# Patient Record
Sex: Female | Born: 1945
Health system: Southern US, Community
[De-identification: ages and names within clinical notes are randomized; demographics above are authoritative.]

## PROBLEM LIST (undated history)

## (undated) DIAGNOSIS — C50412 Malignant neoplasm of upper-outer quadrant of left female breast: Secondary | ICD-10-CM

## (undated) DIAGNOSIS — N3289 Other specified disorders of bladder: Secondary | ICD-10-CM

## (undated) DIAGNOSIS — N390 Urinary tract infection, site not specified: Secondary | ICD-10-CM

## (undated) DIAGNOSIS — H539 Unspecified visual disturbance: Secondary | ICD-10-CM

## (undated) DIAGNOSIS — R42 Dizziness and giddiness: Secondary | ICD-10-CM

## (undated) DIAGNOSIS — F32A Depression, unspecified: Secondary | ICD-10-CM

## (undated) DIAGNOSIS — J189 Pneumonia, unspecified organism: Secondary | ICD-10-CM

## (undated) DIAGNOSIS — R51 Headache: Secondary | ICD-10-CM

## (undated) DIAGNOSIS — D496 Neoplasm of unspecified behavior of brain: Secondary | ICD-10-CM

## (undated) DIAGNOSIS — R413 Other amnesia: Secondary | ICD-10-CM

## (undated) DIAGNOSIS — J329 Chronic sinusitis, unspecified: Secondary | ICD-10-CM

## (undated) DIAGNOSIS — I10 Essential (primary) hypertension: Secondary | ICD-10-CM

## (undated) DIAGNOSIS — F329 Major depressive disorder, single episode, unspecified: Secondary | ICD-10-CM

## (undated) DIAGNOSIS — M069 Rheumatoid arthritis, unspecified: Secondary | ICD-10-CM

## (undated) DIAGNOSIS — M1990 Unspecified osteoarthritis, unspecified site: Secondary | ICD-10-CM

## (undated) HISTORY — DX: Other amnesia: R41.3

## (undated) HISTORY — PX: CYSTECTOMY: SHX5119

## (undated) HISTORY — PX: ABDOMINAL HYSTERECTOMY: SHX81

## (undated) HISTORY — PX: STAPEDES SURGERY: SHX789

## (undated) HISTORY — PX: APPENDECTOMY: SHX54

## (undated) HISTORY — DX: Rheumatoid arthritis, unspecified: M06.9

## (undated) HISTORY — PX: TOTAL KNEE ARTHROPLASTY: SHX125

---

## 1975-06-08 HISTORY — PX: EYE SURGERY: SHX253

## 1976-06-07 HISTORY — PX: GANGLION CYST EXCISION: SHX1691

## 1999-02-26 ENCOUNTER — Emergency Department (HOSPITAL_COMMUNITY): Admission: EM | Admit: 1999-02-26 | Discharge: 1999-02-26 | Payer: Self-pay | Admitting: Emergency Medicine

## 1999-02-26 ENCOUNTER — Encounter: Payer: Self-pay | Admitting: Emergency Medicine

## 2005-06-11 ENCOUNTER — Encounter: Admission: RE | Admit: 2005-06-11 | Discharge: 2005-06-11 | Payer: Self-pay | Admitting: Orthopedic Surgery

## 2005-07-27 ENCOUNTER — Inpatient Hospital Stay (HOSPITAL_COMMUNITY): Admission: RE | Admit: 2005-07-27 | Discharge: 2005-07-30 | Payer: Self-pay | Admitting: Orthopedic Surgery

## 2011-06-02 ENCOUNTER — Other Ambulatory Visit (HOSPITAL_COMMUNITY): Payer: Self-pay | Admitting: Otolaryngology

## 2011-06-10 ENCOUNTER — Other Ambulatory Visit: Payer: Self-pay | Admitting: Neurosurgery

## 2011-06-11 ENCOUNTER — Encounter (HOSPITAL_COMMUNITY): Payer: Self-pay

## 2011-06-14 ENCOUNTER — Other Ambulatory Visit: Payer: Self-pay | Admitting: Neurosurgery

## 2011-06-14 DIAGNOSIS — D320 Benign neoplasm of cerebral meninges: Secondary | ICD-10-CM

## 2011-06-17 ENCOUNTER — Ambulatory Visit
Admission: RE | Admit: 2011-06-17 | Discharge: 2011-06-17 | Disposition: A | Discharge status: Home / Self Care | Payer: Medicare Other | Source: Ambulatory Visit | Attending: Neurosurgery | Admitting: Neurosurgery

## 2011-06-17 ENCOUNTER — Other Ambulatory Visit: Payer: Medicare Other

## 2011-06-17 DIAGNOSIS — D320 Benign neoplasm of cerebral meninges: Secondary | ICD-10-CM

## 2011-06-17 MED ORDER — GADOBENATE DIMEGLUMINE 529 MG/ML IV SOLN
16.0000 mL | Freq: Once | INTRAVENOUS | Status: AC | PRN
  Administered 2011-06-17: 16 mL via INTRAVENOUS

## 2011-06-18 ENCOUNTER — Encounter (HOSPITAL_COMMUNITY): Payer: Self-pay

## 2011-06-18 ENCOUNTER — Other Ambulatory Visit: Payer: Self-pay

## 2011-06-18 ENCOUNTER — Encounter (HOSPITAL_COMMUNITY)
Admission: RE | Admit: 2011-06-18 | Discharge: 2011-06-18 | Disposition: A | Discharge status: Home / Self Care | Payer: Medicare Other | Source: Ambulatory Visit | Attending: Neurosurgery | Admitting: Neurosurgery

## 2011-06-18 HISTORY — DX: Neoplasm of unspecified behavior of brain: D49.6

## 2011-06-18 HISTORY — DX: Unspecified osteoarthritis, unspecified site: M19.90

## 2011-06-18 HISTORY — DX: Other specified disorders of bladder: N32.89

## 2011-06-18 HISTORY — DX: Unspecified visual disturbance: H53.9

## 2011-06-18 HISTORY — DX: Dizziness and giddiness: R42

## 2011-06-18 HISTORY — DX: Essential (primary) hypertension: I10

## 2011-06-18 HISTORY — DX: Headache: R51

## 2011-06-18 HISTORY — DX: Major depressive disorder, single episode, unspecified: F32.9

## 2011-06-18 HISTORY — DX: Chronic sinusitis, unspecified: J32.9

## 2011-06-18 HISTORY — DX: Depression, unspecified: F32.A

## 2011-06-18 LAB — TYPE AND SCREEN
ABO/RH(D): O POS
Unit division: 0
Unit division: 0

## 2011-06-18 LAB — CBC
Hemoglobin: 12.5 g/dL (ref 12.0–15.0)
MCH: 30.6 pg (ref 26.0–34.0)
MCHC: 34.8 g/dL (ref 30.0–36.0)

## 2011-06-18 LAB — BASIC METABOLIC PANEL
BUN: 28 mg/dL — ABNORMAL HIGH (ref 6–23)
Creatinine, Ser: 0.9 mg/dL (ref 0.50–1.10)
GFR calc non Af Amer: 65 mL/min — ABNORMAL LOW (ref >90)
Glucose, Bld: 125 mg/dL — ABNORMAL HIGH (ref 70–99)
Potassium: 4.4 mEq/L (ref 3.5–5.1)

## 2011-06-18 LAB — SURGICAL PCR SCREEN: MRSA, PCR: NEGATIVE

## 2011-06-18 LAB — ABO/RH: ABO/RH(D): O POS

## 2011-06-18 NOTE — Pre-Procedure Instructions (Signed)
19 Jacqueline Christian  06/18/2011   Your procedure is scheduled on:  Tuesday, January 15  Report to [facility] Stay Center at 6:30 AM.  Call this number if you have problems the morning of surgery: [phone]   Remember:   Do not eat food:After Midnight.  May have clear liquids: up to 4 Hours before arrival.  Clear liquids include soda, tea, black coffee, apple or grape juice, broth.  Take these medicines the morning of surgery with A SIP OF WATER: Atenolol, Zyrtec if needed, Ditropan   Do not wear jewelry, make-up or nail polish.  Do not wear lotions, powders, or perfumes. You may wear deodorant.  Do not shave 48 hours prior to surgery.  Do not bring valuables to the hospital.  Contacts, dentures or bridgework may not be worn into surgery.  Leave suitcase in the car. After surgery it may be brought to your room.  For patients admitted to the hospital, checkout time is 11:00 AM the day of discharge.   Patients discharged the day of surgery will not be allowed to drive home.  Name and phone number of your driver: NA  Special Instructions: CHG Shower Use Special Wash: 1/2 bottle night before surgery and 1/2 bottle morning of surgery.   Please read over the following fact sheets that you were given: Pain Booklet, Coughing and Deep Breathing, Blood Transfusion Information and Surgical Site Infection Prevention

## 2011-06-18 NOTE — Progress Notes (Signed)
Chart left for anesthesia review re: elevated WBC. Pt was recently treated for sinus infection.

## 2011-06-21 MED ORDER — CEFAZOLIN SODIUM-DEXTROSE 2-3 GM-% IV SOLR
2.0000 g | INTRAVENOUS | Status: AC
  Administered 2011-06-22: 2 g via INTRAVENOUS
  Filled 2011-06-21: qty 50

## 2011-06-21 NOTE — Consult Note (Addendum)
Anesthesia:  Patient is a 66 year old female scheduled for a craniotomy for 06/22/11 for a meningioma.  Her history includes HTN, depression, headaches, recurrent sinus infections, and brain tumor.    EKG from 06/18/11 shows NSR.  CXR from Dr. Jackolyn Confer office done on 05/07/11 showed no evidence for acute cardiopulmonary abnormality.    I was asked to review her labs showing an elevated WBC of 18.1K.  Patient is not here for me to exam, but she is on Decadron, so would favor this as a contributing factor.  Clinical correlation on the day of surgery.  She was afebrile, sats 96 % at her PAT appointment on 06/18/11.  No acute respiratory symptoms were documented at that time.  Could consider a repeat CXR if any respiratory symptoms on the day of surgery; otherwise will only plan to order a UA at this time.

## 2011-06-22 ENCOUNTER — Inpatient Hospital Stay (HOSPITAL_COMMUNITY)
Admission: RE | Admit: 2011-06-22 | Discharge: 2011-07-07 | DRG: 003 | Disposition: A | Discharge status: Rehabilitation Facility | Payer: Medicare Other | Source: Ambulatory Visit | Attending: Neurosurgery | Admitting: Neurosurgery

## 2011-06-22 ENCOUNTER — Other Ambulatory Visit: Payer: Self-pay | Admitting: Neurosurgery

## 2011-06-22 ENCOUNTER — Encounter (HOSPITAL_COMMUNITY): Payer: Self-pay | Admitting: Vascular Surgery

## 2011-06-22 ENCOUNTER — Ambulatory Visit (HOSPITAL_COMMUNITY): Payer: Medicare Other | Admitting: Vascular Surgery

## 2011-06-22 ENCOUNTER — Encounter (HOSPITAL_COMMUNITY)
Admission: RE | Disposition: A | Discharge status: Rehabilitation Facility | Payer: Self-pay | Source: Ambulatory Visit | Attending: Neurosurgery

## 2011-06-22 ENCOUNTER — Inpatient Hospital Stay (HOSPITAL_COMMUNITY): Payer: Medicare Other | Admitting: Anesthesiology

## 2011-06-22 ENCOUNTER — Encounter (HOSPITAL_COMMUNITY): Payer: Self-pay | Admitting: Anesthesiology

## 2011-06-22 ENCOUNTER — Inpatient Hospital Stay (HOSPITAL_COMMUNITY): Payer: Medicare Other

## 2011-06-22 DIAGNOSIS — D696 Thrombocytopenia, unspecified: Secondary | ICD-10-CM | POA: Diagnosis not present

## 2011-06-22 DIAGNOSIS — G8190 Hemiplegia, unspecified affecting unspecified side: Secondary | ICD-10-CM | POA: Diagnosis not present

## 2011-06-22 DIAGNOSIS — G936 Cerebral edema: Secondary | ICD-10-CM | POA: Diagnosis not present

## 2011-06-22 DIAGNOSIS — E876 Hypokalemia: Secondary | ICD-10-CM | POA: Diagnosis not present

## 2011-06-22 DIAGNOSIS — I10 Essential (primary) hypertension: Secondary | ICD-10-CM | POA: Diagnosis present

## 2011-06-22 DIAGNOSIS — J189 Pneumonia, unspecified organism: Secondary | ICD-10-CM | POA: Diagnosis not present

## 2011-06-22 DIAGNOSIS — R7309 Other abnormal glucose: Secondary | ICD-10-CM | POA: Diagnosis not present

## 2011-06-22 DIAGNOSIS — I6200 Nontraumatic subdural hemorrhage, unspecified: Secondary | ICD-10-CM | POA: Diagnosis not present

## 2011-06-22 DIAGNOSIS — J9600 Acute respiratory failure, unspecified whether with hypoxia or hypercapnia: Secondary | ICD-10-CM | POA: Diagnosis not present

## 2011-06-22 DIAGNOSIS — D320 Benign neoplasm of cerebral meninges: Principal | ICD-10-CM | POA: Diagnosis present

## 2011-06-22 DIAGNOSIS — Z96659 Presence of unspecified artificial knee joint: Secondary | ICD-10-CM

## 2011-06-22 DIAGNOSIS — Z01812 Encounter for preprocedural laboratory examination: Secondary | ICD-10-CM

## 2011-06-22 DIAGNOSIS — D649 Anemia, unspecified: Secondary | ICD-10-CM | POA: Diagnosis not present

## 2011-06-22 DIAGNOSIS — I498 Other specified cardiac arrhythmias: Secondary | ICD-10-CM | POA: Diagnosis not present

## 2011-06-22 DIAGNOSIS — E46 Unspecified protein-calorie malnutrition: Secondary | ICD-10-CM | POA: Diagnosis not present

## 2011-06-22 DIAGNOSIS — Z79899 Other long term (current) drug therapy: Secondary | ICD-10-CM

## 2011-06-22 HISTORY — PX: CRANIOTOMY: SHX93

## 2011-06-22 LAB — PREPARE FRESH FROZEN PLASMA
Unit division: 0
Unit division: 0

## 2011-06-22 LAB — URINALYSIS, MICROSCOPIC ONLY
Ketones, ur: NEGATIVE mg/dL
Leukocytes, UA: NEGATIVE
Nitrite: NEGATIVE
Protein, ur: NEGATIVE mg/dL

## 2011-06-22 LAB — APTT: aPTT: 22 seconds — ABNORMAL LOW (ref 24–37)

## 2011-06-22 LAB — PROTIME-INR
INR: 1.24 (ref 0.00–1.49)
Prothrombin Time: 15.9 seconds — ABNORMAL HIGH (ref 11.6–15.2)

## 2011-06-22 LAB — CBC
MCV: 86.4 fL (ref 78.0–100.0)
Platelets: 109 10*3/uL — ABNORMAL LOW (ref 150–400)
RBC: 3.52 MIL/uL — ABNORMAL LOW (ref 3.87–5.11)
RDW: 14.7 % (ref 11.5–15.5)
WBC: 11.5 10*3/uL — ABNORMAL HIGH (ref 4.0–10.5)

## 2011-06-22 SURGERY — CRANIOTOMY TUMOR EXCISION
Anesthesia: General | Site: Head | Laterality: Right | Wound class: Clean

## 2011-06-22 SURGERY — CRANIOTOMY HEMATOMA EVACUATION SUBDURAL
Anesthesia: General | Site: Head | Laterality: Right | Wound class: Clean

## 2011-06-22 MED ORDER — PROPOFOL 10 MG/ML IV EMUL
INTRAVENOUS | Status: DC | PRN
  Administered 2011-06-22: 50 ug/kg/min via INTRAVENOUS

## 2011-06-22 MED ORDER — FENTANYL CITRATE 0.05 MG/ML IJ SOLN
INTRAMUSCULAR | Status: DC | PRN
  Administered 2011-06-22 (×2): 50 ug via INTRAVENOUS

## 2011-06-22 MED ORDER — OXYBUTYNIN CHLORIDE 5 MG PO TABS
5.0000 mg | ORAL_TABLET | Freq: Two times a day (BID) | ORAL | Status: DC
  Administered 2011-06-22 – 2011-06-26 (×8): 5 mg via ORAL
  Filled 2011-06-22 (×9): qty 1

## 2011-06-22 MED ORDER — ROCURONIUM BROMIDE 100 MG/10ML IV SOLN
INTRAVENOUS | Status: DC | PRN
  Administered 2011-06-22 (×2): 20 mg via INTRAVENOUS
  Administered 2011-06-22: 60 mg via INTRAVENOUS

## 2011-06-22 MED ORDER — HYDROMORPHONE HCL PF 1 MG/ML IJ SOLN
INTRAMUSCULAR | Status: AC
  Filled 2011-06-22: qty 1

## 2011-06-22 MED ORDER — ONDANSETRON HCL 4 MG/2ML IJ SOLN: 4.0000 mg | Freq: Once | INTRAMUSCULAR | Status: DC | PRN

## 2011-06-22 MED ORDER — BACITRACIN 50000 UNITS IM SOLR
INTRAMUSCULAR | Status: AC
  Filled 2011-06-22: qty 1

## 2011-06-22 MED ORDER — DEXAMETHASONE 4 MG PO TABS: 4.0000 mg | ORAL_TABLET | Freq: Three times a day (TID) | ORAL | Status: DC

## 2011-06-22 MED ORDER — SODIUM CHLORIDE 0.9 % IV SOLN
INTRAVENOUS | Status: DC | PRN
  Administered 2011-06-22 (×2): via INTRAVENOUS

## 2011-06-22 MED ORDER — ACETAMINOPHEN 325 MG PO TABS
650.0000 mg | ORAL_TABLET | ORAL | Status: DC | PRN
  Administered 2011-06-24 – 2011-06-26 (×2): 650 mg via ORAL
  Filled 2011-06-22 (×2): qty 2

## 2011-06-22 MED ORDER — PROPOFOL 10 MG/ML IV EMUL
5.0000 ug/kg/min | INTRAVENOUS | Status: DC
  Administered 2011-06-23: 70 ug/kg/min via INTRAVENOUS
  Administered 2011-06-23 (×2): 20 ug/kg/min via INTRAVENOUS
  Administered 2011-06-24 (×2): 40 ug/kg/min via INTRAVENOUS
  Administered 2011-06-24: 20 ug/kg/min via INTRAVENOUS
  Administered 2011-06-24 (×2): 40 ug/kg/min via INTRAVENOUS
  Administered 2011-06-25: 35 ug/kg/min via INTRAVENOUS
  Administered 2011-06-25: 40 ug/kg/min via INTRAVENOUS
  Administered 2011-06-25: 35 ug/kg/min via INTRAVENOUS
  Administered 2011-06-25: 40 ug/kg/min via INTRAVENOUS
  Administered 2011-06-25 – 2011-06-26 (×2): 35 ug/kg/min via INTRAVENOUS
  Filled 2011-06-22 (×15): qty 100

## 2011-06-22 MED ORDER — HYDRALAZINE HCL 20 MG/ML IJ SOLN
INTRAMUSCULAR | Status: DC | PRN
  Administered 2011-06-22 (×2): 4 mg via INTRAVENOUS

## 2011-06-22 MED ORDER — ACETAMINOPHEN 650 MG RE SUPP: 650.0000 mg | RECTAL | Status: DC | PRN

## 2011-06-22 MED ORDER — ACETAMINOPHEN-CODEINE #3 300-30 MG PO TABS
1.0000 | ORAL_TABLET | ORAL | Status: DC | PRN
  Administered 2011-07-02 – 2011-07-03 (×2): 2 via ORAL
  Administered 2011-07-03 – 2011-07-04 (×4): 1 via ORAL
  Administered 2011-07-05 – 2011-07-06 (×4): 2 via ORAL
  Administered 2011-07-07: 1 via ORAL
  Filled 2011-06-22 (×2): qty 2
  Filled 2011-06-22: qty 1
  Filled 2011-06-22: qty 2
  Filled 2011-06-22: qty 1
  Filled 2011-06-22: qty 2
  Filled 2011-06-22: qty 1
  Filled 2011-06-22: qty 2
  Filled 2011-06-22: qty 1
  Filled 2011-06-22: qty 2
  Filled 2011-06-22 (×2): qty 1

## 2011-06-22 MED ORDER — SODIUM CHLORIDE 0.9 % IV SOLN
500.0000 mg | INTRAVENOUS | Status: DC
  Filled 2011-06-22: qty 5

## 2011-06-22 MED ORDER — FAMOTIDINE 20 MG PO TABS: 20.0000 mg | ORAL_TABLET | Freq: Two times a day (BID) | ORAL | Status: DC

## 2011-06-22 MED ORDER — PANTOPRAZOLE SODIUM 40 MG IV SOLR
40.0000 mg | Freq: Every day | INTRAVENOUS | Status: DC
  Administered 2011-06-22 – 2011-06-25 (×4): 40 mg via INTRAVENOUS
  Filled 2011-06-22 (×5): qty 40

## 2011-06-22 MED ORDER — LABETALOL HCL 5 MG/ML IV SOLN
INTRAVENOUS | Status: AC
  Administered 2011-06-22: 20 mg via INTRAVENOUS
  Filled 2011-06-22: qty 4

## 2011-06-22 MED ORDER — POTASSIUM CHLORIDE IN NACL 20-0.9 MEQ/L-% IV SOLN
INTRAVENOUS | Status: DC
  Administered 2011-06-22 – 2011-06-23 (×2): via INTRAVENOUS
  Filled 2011-06-22 (×4): qty 1000

## 2011-06-22 MED ORDER — HYDROMORPHONE HCL PF 1 MG/ML IJ SOLN: 0.5000 mg | INTRAMUSCULAR | Status: DC | PRN

## 2011-06-22 MED ORDER — GLYCOPYRROLATE 0.2 MG/ML IJ SOLN
INTRAMUSCULAR | Status: DC | PRN
  Administered 2011-06-22: .7 mg via INTRAVENOUS
  Administered 2011-06-22: 0.2 mg via INTRAVENOUS

## 2011-06-22 MED ORDER — DEXAMETHASONE SODIUM PHOSPHATE 4 MG/ML IJ SOLN
4.0000 mg | Freq: Three times a day (TID) | INTRAMUSCULAR | Status: DC
  Filled 2011-06-22 (×2): qty 1

## 2011-06-22 MED ORDER — PROPOFOL 10 MG/ML IV EMUL
INTRAVENOUS | Status: DC | PRN
  Administered 2011-06-22: 150 mg via INTRAVENOUS
  Administered 2011-06-22: 50 mg via INTRAVENOUS

## 2011-06-22 MED ORDER — 0.9 % SODIUM CHLORIDE (POUR BTL) OPTIME
TOPICAL | Status: DC | PRN
  Administered 2011-06-22 (×2): 1000 mL

## 2011-06-22 MED ORDER — INSULIN ASPART 100 UNIT/ML ~~LOC~~ SOLN
0.0000 [IU] | SUBCUTANEOUS | Status: DC
  Filled 2011-06-22 (×3): qty 3

## 2011-06-22 MED ORDER — LIDOCAINE-EPINEPHRINE 1 %-1:100000 IJ SOLN
INTRAMUSCULAR | Status: DC | PRN
  Administered 2011-06-22: 20 mL via INTRADERMAL

## 2011-06-22 MED ORDER — HETASTARCH-ELECTROLYTES 6 % IV SOLN
INTRAVENOUS | Status: DC | PRN
  Administered 2011-06-22: 12:00:00 via INTRAVENOUS

## 2011-06-22 MED ORDER — HYDROMORPHONE HCL PF 1 MG/ML IJ SOLN: 0.2500 mg | INTRAMUSCULAR | Status: DC | PRN

## 2011-06-22 MED ORDER — LABETALOL HCL 5 MG/ML IV SOLN: 10.0000 mg | INTRAVENOUS | Status: DC | PRN

## 2011-06-22 MED ORDER — CEFAZOLIN SODIUM 1-5 GM-% IV SOLN
INTRAVENOUS | Status: AC
  Filled 2011-06-22: qty 50

## 2011-06-22 MED ORDER — SENNOSIDES-DOCUSATE SODIUM 8.6-50 MG PO TABS
1.0000 | ORAL_TABLET | Freq: Two times a day (BID) | ORAL | Status: DC
  Administered 2011-06-22 – 2011-06-27 (×11): 1 via ORAL
  Filled 2011-06-22 (×14): qty 1

## 2011-06-22 MED ORDER — SODIUM CHLORIDE 0.9 % IV SOLN
INTRAVENOUS | Status: AC
  Administered 2011-06-22 (×2): via INTRAVENOUS
  Filled 2011-06-22: qty 500

## 2011-06-22 MED ORDER — SODIUM CHLORIDE 0.9 % IV SOLN: INTRAVENOUS | Status: DC

## 2011-06-22 MED ORDER — FENTANYL CITRATE 0.05 MG/ML IJ SOLN
INTRAMUSCULAR | Status: DC | PRN
  Administered 2011-06-22: 150 ug via INTRAVENOUS
  Administered 2011-06-22 (×2): 100 ug via INTRAVENOUS
  Administered 2011-06-22: 50 ug via INTRAVENOUS
  Administered 2011-06-22: 100 ug via INTRAVENOUS

## 2011-06-22 MED ORDER — PROPOFOL 10 MG/ML IV EMUL
INTRAVENOUS | Status: DC | PRN
  Administered 2011-06-22: 80 mg via INTRAVENOUS

## 2011-06-22 MED ORDER — DEXAMETHASONE SODIUM PHOSPHATE 4 MG/ML IJ SOLN
6.0000 mg | Freq: Four times a day (QID) | INTRAMUSCULAR | Status: AC
  Administered 2011-06-22 – 2011-06-23 (×4): 6 mg via INTRAVENOUS
  Filled 2011-06-22 (×3): qty 2
  Filled 2011-06-22: qty 1.5

## 2011-06-22 MED ORDER — PHENYLEPHRINE HCL 10 MG/ML IJ SOLN
10.0000 mg | INTRAVENOUS | Status: DC | PRN
  Administered 2011-06-22: 10 ug/min via INTRAVENOUS

## 2011-06-22 MED ORDER — LORATADINE 10 MG PO TABS
10.0000 mg | ORAL_TABLET | Freq: Every day | ORAL | Status: DC
  Administered 2011-06-23 – 2011-06-26 (×4): 10 mg via ORAL
  Filled 2011-06-22 (×4): qty 1

## 2011-06-22 MED ORDER — EPHEDRINE SULFATE 50 MG/ML IJ SOLN
INTRAMUSCULAR | Status: DC | PRN
  Administered 2011-06-22: 5 mg via INTRAVENOUS

## 2011-06-22 MED ORDER — ACETAMINOPHEN 325 MG PO TABS: 650.0000 mg | ORAL_TABLET | ORAL | Status: DC | PRN

## 2011-06-22 MED ORDER — SODIUM CHLORIDE 0.9 % IR SOLN
Status: DC | PRN
  Administered 2011-06-22: 3000 mL
  Administered 2011-06-22: 1000 mL

## 2011-06-22 MED ORDER — DEXAMETHASONE 4 MG PO TABS: 4.0000 mg | ORAL_TABLET | Freq: Four times a day (QID) | ORAL | Status: DC

## 2011-06-22 MED ORDER — SODIUM CHLORIDE 0.9 % IV SOLN
INTRAVENOUS | Status: AC
  Filled 2011-06-22: qty 500

## 2011-06-22 MED ORDER — ROCURONIUM BROMIDE 100 MG/10ML IV SOLN
INTRAVENOUS | Status: DC | PRN
  Administered 2011-06-22: 40 mg via INTRAVENOUS

## 2011-06-22 MED ORDER — ALBUMIN HUMAN 5 % IV SOLN
INTRAVENOUS | Status: DC | PRN
  Administered 2011-06-22: 12:00:00 via INTRAVENOUS

## 2011-06-22 MED ORDER — SODIUM CHLORIDE 0.9 % IV SOLN
INTRAVENOUS | Status: DC | PRN
  Administered 2011-06-22: 22:00:00 via INTRAVENOUS

## 2011-06-22 MED ORDER — THROMBIN 20000 UNITS EX KIT
PACK | CUTANEOUS | Status: DC | PRN
  Administered 2011-06-22 (×2): 20000 [IU] via TOPICAL

## 2011-06-22 MED ORDER — ATENOLOL 25 MG PO TABS
25.0000 mg | ORAL_TABLET | Freq: Every day | ORAL | Status: DC
  Administered 2011-06-23 – 2011-06-27 (×3): 25 mg via ORAL
  Filled 2011-06-22 (×7): qty 1

## 2011-06-22 MED ORDER — CEFAZOLIN SODIUM 1-5 GM-% IV SOLN
INTRAVENOUS | Status: DC | PRN
  Administered 2011-06-22: 1 g via INTRAVENOUS

## 2011-06-22 MED ORDER — DEXAMETHASONE SODIUM PHOSPHATE 4 MG/ML IJ SOLN
INTRAMUSCULAR | Status: DC | PRN
  Administered 2011-06-22 (×2): 10 mg via INTRAVENOUS

## 2011-06-22 MED ORDER — LABETALOL HCL 5 MG/ML IV SOLN
INTRAVENOUS | Status: DC | PRN
  Administered 2011-06-22: 20 mg via INTRAVENOUS
  Administered 2011-06-22 (×3): 10 mg via INTRAVENOUS

## 2011-06-22 MED ORDER — BACITRACIN 50000 UNITS IM SOLR
INTRAMUSCULAR | Status: AC
  Administered 2011-06-22: 22:00:00
  Filled 2011-06-22: qty 1

## 2011-06-22 MED ORDER — SUFENTANIL CITRATE 50 MCG/ML IV SOLN
INTRAVENOUS | Status: DC | PRN
  Administered 2011-06-22: 15 ug via INTRAVENOUS
  Administered 2011-06-22: 20 ug via INTRAVENOUS
  Administered 2011-06-22: 15 ug via INTRAVENOUS

## 2011-06-22 MED ORDER — HYDROMORPHONE HCL PF 1 MG/ML IJ SOLN
0.5000 mg | INTRAMUSCULAR | Status: DC | PRN
  Administered 2011-06-22: 1 mg via INTRAVENOUS

## 2011-06-22 MED ORDER — BACITRACIN ZINC 500 UNIT/GM EX OINT
TOPICAL_OINTMENT | CUTANEOUS | Status: DC | PRN
  Administered 2011-06-22: 1 via TOPICAL

## 2011-06-22 MED ORDER — NEOSTIGMINE METHYLSULFATE 1 MG/ML IJ SOLN
INTRAMUSCULAR | Status: DC | PRN
  Administered 2011-06-22: 5 mg via INTRAVENOUS

## 2011-06-22 MED ORDER — HYDROCODONE-ACETAMINOPHEN 5-325 MG PO TABS: 1.0000 | ORAL_TABLET | ORAL | Status: DC | PRN

## 2011-06-22 MED ORDER — SODIUM CHLORIDE 0.9 % IV SOLN
500.0000 mg | Freq: Two times a day (BID) | INTRAVENOUS | Status: DC
  Administered 2011-06-22 – 2011-07-03 (×22): 500 mg via INTRAVENOUS
  Filled 2011-06-22 (×24): qty 5

## 2011-06-22 MED ORDER — MANNITOL 25 % IV SOLN
INTRAVENOUS | Status: DC | PRN
  Administered 2011-06-22: 37.5 g via INTRAVENOUS

## 2011-06-22 MED ORDER — ONDANSETRON HCL 4 MG PO TABS: 4.0000 mg | ORAL_TABLET | ORAL | Status: DC | PRN

## 2011-06-22 MED ORDER — ONDANSETRON HCL 4 MG/2ML IJ SOLN: 4.0000 mg | INTRAMUSCULAR | Status: DC | PRN

## 2011-06-22 MED ORDER — PANTOPRAZOLE SODIUM 40 MG IV SOLR: 40.0000 mg | Freq: Every day | INTRAVENOUS | Status: DC

## 2011-06-22 MED ORDER — HEMOSTATIC AGENTS (NO CHARGE) OPTIME
TOPICAL | Status: DC | PRN
  Administered 2011-06-22: 2 via TOPICAL

## 2011-06-22 MED ORDER — DEXAMETHASONE 6 MG PO TABS
6.0000 mg | ORAL_TABLET | Freq: Four times a day (QID) | ORAL | Status: DC
  Filled 2011-06-22 (×3): qty 1

## 2011-06-22 MED ORDER — CEFAZOLIN SODIUM 1-5 GM-% IV SOLN
1.0000 g | Freq: Three times a day (TID) | INTRAVENOUS | Status: AC
  Administered 2011-06-22 – 2011-06-23 (×2): 1 g via INTRAVENOUS
  Filled 2011-06-22 (×2): qty 50

## 2011-06-22 MED ORDER — SODIUM CHLORIDE 0.9 % IV SOLN
INTRAVENOUS | Status: DC | PRN
  Administered 2011-06-22 (×3): via INTRAVENOUS

## 2011-06-22 MED ORDER — BUPIVACAINE HCL (PF) 0.5 % IJ SOLN
INTRAMUSCULAR | Status: DC | PRN
  Administered 2011-06-22: 30 mL

## 2011-06-22 MED ORDER — LIDOCAINE HCL (CARDIAC) 20 MG/ML IV SOLN
INTRAVENOUS | Status: DC | PRN
  Administered 2011-06-22: 40 mg via INTRAVENOUS

## 2011-06-22 MED ORDER — HEMOSTATIC AGENTS (NO CHARGE) OPTIME
TOPICAL | Status: DC | PRN
  Administered 2011-06-22: 1 via TOPICAL

## 2011-06-22 MED ORDER — LABETALOL HCL 5 MG/ML IV SOLN
10.0000 mg | INTRAVENOUS | Status: DC | PRN
  Administered 2011-06-22 (×3): 20 mg via INTRAVENOUS

## 2011-06-22 MED ORDER — DEXAMETHASONE SODIUM PHOSPHATE 4 MG/ML IJ SOLN
4.0000 mg | Freq: Four times a day (QID) | INTRAMUSCULAR | Status: AC
  Administered 2011-06-23 – 2011-06-24 (×3): 4 mg via INTRAVENOUS
  Filled 2011-06-22 (×4): qty 1

## 2011-06-22 MED ORDER — DOCUSATE SODIUM 100 MG PO CAPS: 100.0000 mg | ORAL_CAPSULE | Freq: Two times a day (BID) | ORAL | Status: DC

## 2011-06-22 MED ORDER — ONDANSETRON HCL 4 MG/2ML IJ SOLN: 4.0000 mg | Freq: Four times a day (QID) | INTRAMUSCULAR | Status: DC | PRN

## 2011-06-22 MED ORDER — SODIUM CHLORIDE 0.9 % IV SOLN: 500.0000 mg | Freq: Two times a day (BID) | INTRAVENOUS | Status: DC

## 2011-06-22 MED ORDER — ONDANSETRON HCL 4 MG/2ML IJ SOLN
INTRAMUSCULAR | Status: DC | PRN
  Administered 2011-06-22: 4 mg via INTRAVENOUS

## 2011-06-22 MED ORDER — DEXAMETHASONE SODIUM PHOSPHATE 4 MG/ML IJ SOLN: 6.0000 mg | Freq: Four times a day (QID) | INTRAMUSCULAR | Status: DC

## 2011-06-22 MED ORDER — DEXTROSE 10 % IV SOLN: INTRAVENOUS | Status: DC

## 2011-06-22 MED ORDER — THROMBIN 20000 UNITS EX KIT
PACK | CUTANEOUS | Status: DC | PRN
  Administered 2011-06-22: 22:00:00 via TOPICAL

## 2011-06-22 SURGICAL SUPPLY — 100 items
BANDAGE GAUZE 4  KLING STR (GAUZE/BANDAGES/DRESSINGS) ×2 IMPLANT
BIT DRILL WIRE PASS 1.3MM (BIT) ×1 IMPLANT
BLADE SURG 11 STRL SS (BLADE) ×2 IMPLANT
BLADE SURG ROTATE 9660 (MISCELLANEOUS) ×2 IMPLANT
BRUSH SCRUB EZ 1% IODOPHOR (MISCELLANEOUS) ×2 IMPLANT
BRUSH SCRUB EZ PLAIN DRY (MISCELLANEOUS) ×2 IMPLANT
BUR ACORN 6.0 PRECISION (BURR) ×2 IMPLANT
BUR ADDG 1.1 (BURR) IMPLANT
BUR ROUND FLUTED 4 SOFT TCH (BURR) ×2 IMPLANT
BUR ROUTER D-58 CRANI (BURR) IMPLANT
CANISTER SUCTION 2500CC (MISCELLANEOUS) ×2 IMPLANT
CLIP TI LARGE 6 (CLIP) ×4 IMPLANT
CLIP TI MEDIUM 6 (CLIP) IMPLANT
CLOTH BEACON ORANGE TIMEOUT ST (SAFETY) ×2 IMPLANT
CONT SPEC 4OZ CLIKSEAL STRL BL (MISCELLANEOUS) ×4 IMPLANT
CORDS BIPOLAR (ELECTRODE) ×2 IMPLANT
COVER MAYO STAND STRL (DRAPES) IMPLANT
DRAIN SNY WOU 7FLT (WOUND CARE) IMPLANT
DRAPE CAMERA VIDEO/LASER (DRAPES) IMPLANT
DRAPE LONG LASER MIC (DRAPES) IMPLANT
DRAPE MICROSCOPE LEICA (MISCELLANEOUS) ×2 IMPLANT
DRAPE NEUROLOGICAL W/INCISE (DRAPES) ×2 IMPLANT
DRAPE STERI IOBAN 125X83 (DRAPES) IMPLANT
DRAPE WARM FLUID 44X44 (DRAPE) ×2 IMPLANT
DRESSING TELFA 8X3 (GAUZE/BANDAGES/DRESSINGS) ×2 IMPLANT
DRILL WIRE PASS 1.3MM (BIT) ×2
DRSG OPSITE 4X5.5 SM (GAUZE/BANDAGES/DRESSINGS) ×4 IMPLANT
DURAMATRIX ONLAY 3X3 (Plate) ×2 IMPLANT
DURAPREP 6ML APPLICATOR 50/CS (WOUND CARE) ×4 IMPLANT
ELECT CAUTERY BLADE 6.4 (BLADE) ×2 IMPLANT
ELECT REM PT RETURN 9FT ADLT (ELECTROSURGICAL) ×2
ELECTRODE REM PT RTRN 9FT ADLT (ELECTROSURGICAL) ×1 IMPLANT
EVACUATOR 1/8 PVC DRAIN (DRAIN) IMPLANT
EVACUATOR SILICONE 100CC (DRAIN) IMPLANT
FORCEPS BIPOLAR SPETZLER 8 1.0 (NEUROSURGERY SUPPLIES) ×2 IMPLANT
GAUZE KERLIX 2  STERILE LF (GAUZE/BANDAGES/DRESSINGS) ×2 IMPLANT
GAUZE SPONGE 4X4 16PLY XRAY LF (GAUZE/BANDAGES/DRESSINGS) IMPLANT
GLOVE BIO SURGEON STRL SZ8 (GLOVE) ×6 IMPLANT
GLOVE BIOGEL PI IND STRL 7.0 (GLOVE) ×2 IMPLANT
GLOVE BIOGEL PI IND STRL 7.5 (GLOVE) ×1 IMPLANT
GLOVE BIOGEL PI IND STRL 8 (GLOVE) ×2 IMPLANT
GLOVE BIOGEL PI IND STRL 8.5 (GLOVE) ×1 IMPLANT
GLOVE BIOGEL PI INDICATOR 7.0 (GLOVE) ×2
GLOVE BIOGEL PI INDICATOR 7.5 (GLOVE) ×1
GLOVE BIOGEL PI INDICATOR 8 (GLOVE) ×2
GLOVE BIOGEL PI INDICATOR 8.5 (GLOVE) ×1
GLOVE ECLIPSE 7.5 STRL STRAW (GLOVE) ×6 IMPLANT
GLOVE EXAM NITRILE LRG STRL (GLOVE) IMPLANT
GLOVE EXAM NITRILE MD LF STRL (GLOVE) IMPLANT
GLOVE EXAM NITRILE XL STR (GLOVE) IMPLANT
GLOVE EXAM NITRILE XS STR PU (GLOVE) IMPLANT
GLOVE SURG SS PI 6.5 STRL IVOR (GLOVE) ×8 IMPLANT
GOWN BRE IMP SLV AUR LG STRL (GOWN DISPOSABLE) IMPLANT
GOWN BRE IMP SLV AUR XL STRL (GOWN DISPOSABLE) ×8 IMPLANT
GOWN STRL REIN 2XL LVL4 (GOWN DISPOSABLE) ×4 IMPLANT
HEMOSTAT SURGICEL 2X14 (HEMOSTASIS) ×2 IMPLANT
HOOK DURA (MISCELLANEOUS) ×2 IMPLANT
KIT BASIN OR (CUSTOM PROCEDURE TRAY) ×2 IMPLANT
KIT ROOM TURNOVER OR (KITS) ×2 IMPLANT
KNIFE ARACHNOID DISP AM-24-S (MISCELLANEOUS) ×2 IMPLANT
MARKER SKIN DUAL TIP RULER LAB (MISCELLANEOUS) ×2 IMPLANT
MARKER SPHERE PSV REFLC NDI (MISCELLANEOUS) ×4 IMPLANT
NEEDLE HYPO 25X1 1.5 SAFETY (NEEDLE) ×2 IMPLANT
NS IRRIG 1000ML POUR BTL (IV SOLUTION) ×4 IMPLANT
PACK CRANIOTOMY (CUSTOM PROCEDURE TRAY) ×2 IMPLANT
PAD ARMBOARD 7.5X6 YLW CONV (MISCELLANEOUS) ×2 IMPLANT
PAD EYE OVAL STERILE LF (GAUZE/BANDAGES/DRESSINGS) IMPLANT
PATTIES SURGICAL .25X.25 (GAUZE/BANDAGES/DRESSINGS) IMPLANT
PATTIES SURGICAL .5 X.5 (GAUZE/BANDAGES/DRESSINGS) ×2 IMPLANT
PATTIES SURGICAL .5 X3 (DISPOSABLE) ×2 IMPLANT
PATTIES SURGICAL 1/4 X 3 (GAUZE/BANDAGES/DRESSINGS) IMPLANT
PATTIES SURGICAL 1X1 (DISPOSABLE) ×2 IMPLANT
PIN MAYFIELD SKULL DISP (PIN) IMPLANT
PLATE 1.5  2HOLE LNG NEURO (Plate) ×3 IMPLANT
PLATE 1.5 2HOLE LNG NEURO (Plate) ×3 IMPLANT
PLATE 1.5/0.5 18.5MM BURR HOLE (Plate) ×2 IMPLANT
RUBBERBAND STERILE (MISCELLANEOUS) ×2 IMPLANT
SCREW SELF DRILL HT 1.5/4MM (Screw) ×20 IMPLANT
SET TUBING W/EXT DISP (INSTRUMENTS) ×2 IMPLANT
SPECIMEN JAR SMALL (MISCELLANEOUS) ×6 IMPLANT
SPONGE GAUZE 4X4 12PLY (GAUZE/BANDAGES/DRESSINGS) ×4 IMPLANT
SPONGE NEURO XRAY DETECT 1X3 (DISPOSABLE) ×2 IMPLANT
SPONGE SURGIFOAM ABS GEL 100 (HEMOSTASIS) ×4 IMPLANT
STAPLER SKIN PROX WIDE 3.9 (STAPLE) ×2 IMPLANT
STOCKINETTE 6  STRL (DRAPES) ×1
STOCKINETTE 6 STRL (DRAPES) ×1 IMPLANT
SUT ETHILON 3 0 FSL (SUTURE) IMPLANT
SUT NURALON 4 0 TR CR/8 (SUTURE) ×6 IMPLANT
SUT SILK 2 0 FS (SUTURE) ×4 IMPLANT
SUT VIC AB 2-0 CP2 18 (SUTURE) ×6 IMPLANT
SYR 20ML ECCENTRIC (SYRINGE) ×2 IMPLANT
SYR CONTROL 10ML LL (SYRINGE) ×2 IMPLANT
TIP SONASTAR STD MISONIX 1.9 (TRAY / TRAY PROCEDURE) IMPLANT
TIP STRAIGHT 25KHZ (INSTRUMENTS) ×2 IMPLANT
TOWEL OR 17X24 6PK STRL BLUE (TOWEL DISPOSABLE) ×2 IMPLANT
TOWEL OR 17X26 10 PK STRL BLUE (TOWEL DISPOSABLE) ×2 IMPLANT
TRAY FOLEY CATH 14FRSI W/METER (CATHETERS) ×2 IMPLANT
TUBE CONNECTING 12X1/4 (SUCTIONS) ×2 IMPLANT
UNDERPAD 30X30 INCONTINENT (UNDERPADS AND DIAPERS) ×2 IMPLANT
WATER STERILE IRR 1000ML POUR (IV SOLUTION) ×2 IMPLANT

## 2011-06-22 SURGICAL SUPPLY — 79 items
BANDAGE GAUZE 4  KLING STR (GAUZE/BANDAGES/DRESSINGS) ×6 IMPLANT
BANDAGE GAUZE ELAST BULKY 4 IN (GAUZE/BANDAGES/DRESSINGS) ×2 IMPLANT
BENZOIN TINCTURE PRP APPL 2/3 (GAUZE/BANDAGES/DRESSINGS) IMPLANT
BIT DRILL WIRE PASS 1.3MM (BIT) IMPLANT
BRUSH SCRUB EZ 1% IODOPHOR (MISCELLANEOUS) IMPLANT
BRUSH SCRUB EZ PLAIN DRY (MISCELLANEOUS) IMPLANT
BUR ACORN 6.0 PRECISION (BURR) IMPLANT
BUR ROUTER D-58 CRANI (BURR) IMPLANT
CANISTER SUCTION 2500CC (MISCELLANEOUS) ×2 IMPLANT
CATH ROBINSON RED A/P 12FR (CATHETERS) IMPLANT
CLIP TI MEDIUM 6 (CLIP) IMPLANT
CLOTH BEACON ORANGE TIMEOUT ST (SAFETY) ×2 IMPLANT
CONT SPEC 4OZ CLIKSEAL STRL BL (MISCELLANEOUS) ×2 IMPLANT
CORDS BIPOLAR (ELECTRODE) ×2 IMPLANT
DRAIN PENROSE 1/2X12 LTX STRL (WOUND CARE) IMPLANT
DRAIN SNY WOU 7FLT (WOUND CARE) IMPLANT
DRAPE NEUROLOGICAL W/INCISE (DRAPES) ×2 IMPLANT
DRAPE SURG IRRIG POUCH 19X23 (DRAPES) IMPLANT
DRAPE WARM FLUID 44X44 (DRAPE) ×2 IMPLANT
DRESSING TELFA 8X3 (GAUZE/BANDAGES/DRESSINGS) ×2 IMPLANT
DRILL WIRE PASS 1.3MM (BIT)
DRSG OPSITE 4X5.5 SM (GAUZE/BANDAGES/DRESSINGS) IMPLANT
DRSG PAD ABDOMINAL 8X10 ST (GAUZE/BANDAGES/DRESSINGS) IMPLANT
DURAPREP 6ML APPLICATOR 50/CS (WOUND CARE) ×2 IMPLANT
ELECT CAUTERY BLADE 6.4 (BLADE) ×2 IMPLANT
ELECT REM PT RETURN 9FT ADLT (ELECTROSURGICAL) ×2
ELECTRODE REM PT RTRN 9FT ADLT (ELECTROSURGICAL) ×1 IMPLANT
EVACUATOR SILICONE 100CC (DRAIN) IMPLANT
GAUZE SPONGE 4X4 12PLY STRL LF (GAUZE/BANDAGES/DRESSINGS) ×2 IMPLANT
GAUZE SPONGE 4X4 16PLY XRAY LF (GAUZE/BANDAGES/DRESSINGS) IMPLANT
GLOVE BIO SURGEON STRL SZ 6.5 (GLOVE) ×4 IMPLANT
GLOVE BIO SURGEON STRL SZ8 (GLOVE) ×2 IMPLANT
GLOVE BIOGEL PI IND STRL 6.5 (GLOVE) ×1 IMPLANT
GLOVE BIOGEL PI IND STRL 8 (GLOVE) IMPLANT
GLOVE BIOGEL PI IND STRL 8.5 (GLOVE) ×1 IMPLANT
GLOVE BIOGEL PI INDICATOR 6.5 (GLOVE) ×1
GLOVE BIOGEL PI INDICATOR 8 (GLOVE)
GLOVE BIOGEL PI INDICATOR 8.5 (GLOVE) ×1
GLOVE ECLIPSE 6.5 STRL STRAW (GLOVE) ×2 IMPLANT
GLOVE ECLIPSE 7.5 STRL STRAW (GLOVE) IMPLANT
GLOVE EXAM NITRILE LRG STRL (GLOVE) IMPLANT
GLOVE EXAM NITRILE MD LF STRL (GLOVE) IMPLANT
GLOVE EXAM NITRILE XL STR (GLOVE) IMPLANT
GLOVE EXAM NITRILE XS STR PU (GLOVE) IMPLANT
GOWN BRE IMP SLV AUR LG STRL (GOWN DISPOSABLE) ×6 IMPLANT
GOWN BRE IMP SLV AUR XL STRL (GOWN DISPOSABLE) IMPLANT
GOWN STRL REIN 2XL LVL4 (GOWN DISPOSABLE) IMPLANT
HEMOSTAT SURGICEL 2X14 (HEMOSTASIS) ×2 IMPLANT
KIT BASIN OR (CUSTOM PROCEDURE TRAY) ×2 IMPLANT
KIT REMOVER STAPLE SKIN (MISCELLANEOUS) ×2 IMPLANT
KIT ROOM TURNOVER OR (KITS) ×2 IMPLANT
NEEDLE HYPO 25X1 1.5 SAFETY (NEEDLE) IMPLANT
NS IRRIG 1000ML POUR BTL (IV SOLUTION) ×2 IMPLANT
PACK CRANIOTOMY (CUSTOM PROCEDURE TRAY) ×2 IMPLANT
PAD ARMBOARD 7.5X6 YLW CONV (MISCELLANEOUS) ×4 IMPLANT
PATTIES SURGICAL .5 X.5 (GAUZE/BANDAGES/DRESSINGS) IMPLANT
PATTIES SURGICAL .5 X3 (DISPOSABLE) IMPLANT
PATTIES SURGICAL 1X1 (DISPOSABLE) IMPLANT
PIN MAYFIELD SKULL DISP (PIN) IMPLANT
SCREW SELF DRILL HT 1.5/4MM (Screw) ×10 IMPLANT
SPECIMEN JAR SMALL (MISCELLANEOUS) IMPLANT
SPONGE GAUZE 4X4 12PLY (GAUZE/BANDAGES/DRESSINGS) ×2 IMPLANT
SPONGE NEURO XRAY DETECT 1X3 (DISPOSABLE) IMPLANT
SPONGE SURGIFOAM ABS GEL 12-7 (HEMOSTASIS) IMPLANT
STAPLER SKIN PROX WIDE 3.9 (STAPLE) ×4 IMPLANT
STOCKINETTE TUBULAR 6 STERILE (GAUZE/BANDAGES/DRESSINGS) ×2 IMPLANT
SUT ETHILON 3 0 PS 1 (SUTURE) IMPLANT
SUT NURALON 4 0 TR CR/8 (SUTURE) ×2 IMPLANT
SUT VIC AB 2-0 CP2 18 (SUTURE) ×4 IMPLANT
SUT VIC AB 3-0 SH 8-18 (SUTURE) IMPLANT
SYR 20ML ECCENTRIC (SYRINGE) ×2 IMPLANT
SYR CONTROL 10ML LL (SYRINGE) IMPLANT
TAPE CLOTH 1X10 TAN NS (GAUZE/BANDAGES/DRESSINGS) ×2 IMPLANT
TOWEL OR 17X24 6PK STRL BLUE (TOWEL DISPOSABLE) ×2 IMPLANT
TOWEL OR 17X26 10 PK STRL BLUE (TOWEL DISPOSABLE) ×2 IMPLANT
TRAP SPECIMEN MUCOUS 40CC (MISCELLANEOUS) IMPLANT
TRAY FOLEY CATH 14FRSI W/METER (CATHETERS) IMPLANT
UNDERPAD 30X30 INCONTINENT (UNDERPADS AND DIAPERS) IMPLANT
WATER STERILE IRR 1000ML POUR (IV SOLUTION) ×2 IMPLANT

## 2011-06-22 NOTE — Transfer of Care (Signed)
Immediate Anesthesia Transfer of Care Note  Patient: Jacqueline Christian  Procedure(s) Performed:  CRANIOTOMY HEMATOMA EVACUATION SUBDURAL  Patient Location: NICU  Anesthesia Type: General  Level of Consciousness: sedated and unresponsive  Airway & Oxygen Therapy: Patient remains intubated per anesthesia plan and Patient placed on Ventilator (see vital sign flow sheet for setting)  Post-op Assessment: Report given to PACU RN and Post -op Vital signs reviewed and stable  Post vital signs: Reviewed and stable Filed Vitals:   06/22/11 2100  BP: 132/53  Pulse: 47  Temp:   Resp: 15    Complications: No apparent anesthesia complications

## 2011-06-22 NOTE — Transfer of Care (Signed)
Immediate Anesthesia Transfer of Care Note  Patient: Jacqueline Christian  Procedure(s) Performed:  CRANIOTOMY TUMOR EXCISION - RIGHT pterional Craniotomy for meningioma  Patient Location: PACU  Anesthesia Type: General  Level of Consciousness: awake, sedated and responds to stimulation  Airway & Oxygen Therapy: Patient Spontanous Breathing and Patient connected to face mask oxygen  Post-op Assessment: Report given to PACU RN and Post -op Vital signs reviewed and stable  Post vital signs: Reviewed and stable Filed Vitals:   06/22/11 1632  BP: 140/64  Pulse: 75  Temp: 36.7 C  Resp: 20    Complications: No apparent anesthesia complications

## 2011-06-22 NOTE — Progress Notes (Signed)
Subjective: Patient reports doing OK  Objective: Vital signs in last 24 hours: Temp:  [97.9 F (36.6 C)-98.5 F (36.9 C)] 98.5 F (36.9 C) (01/15 1720) Pulse Rate:  [39-76] 58  (01/15 1915) Resp:  [14-28] 14  (01/15 1915) BP: (124-140)/(56-64) 130/59 mmHg (01/15 1900) SpO2:  [89 %-96 %] 96 % (01/15 1915) Arterial Line BP: (137-155)/(58-65) 145/64 mmHg (01/15 1915) Weight:  [86.4 kg (190 lb 7.6 oz)] 86.4 kg (190 lb 7.6 oz) (01/15 1730)  Intake/Output from previous day:   Intake/Output this shift:    Physical Exam: Opens eyes, states name, able to count fingers.  Somnolent, but arousable.  MAE and follows commands.  Lab Results: No results found for this basename: WBC:2,HGB:2,HCT:2,PLT:2 in the last 72 hours BMET No results found for this basename: NA:2,K:2,CL:2,CO2:2,GLUCOSE:2,BUN:2,CREATININE:2,CALCIUM:2 in the last 72 hours  Studies/Results: No results found.  Assessment/Plan: Doing well immediately postop craniotomy for tumor    LOS: 0 days    Dorian Heckle, MD 06/22/2011, 7:36 PM

## 2011-06-22 NOTE — Anesthesia Preprocedure Evaluation (Signed)
Anesthesia Evaluation  Patient identified by MRN, date of birth, ID band Patient awake    Reviewed: Allergy & Precautions, H&P , NPO status , Patient's Chart, lab work & pertinent test results  History of Anesthesia Complications (+) AWARENESS UNDER ANESTHESIA  Airway Mallampati: I TM Distance: >3 FB Neck ROM: full    Dental  (+) Teeth Intact and Caps   Pulmonary neg pulmonary ROS,    Pulmonary exam normal       Cardiovascular Exercise Tolerance: Good hypertension, regular Normal    Neuro/Psych  Headaches, PSYCHIATRIC DISORDERS    GI/Hepatic negative GI ROS, Neg liver ROS,   Endo/Other  Negative Endocrine ROS  Renal/GU negative Renal ROS  Genitourinary negative   Musculoskeletal   Abdominal   Peds  Hematology negative hematology ROS (+)   Anesthesia Other Findings   Reproductive/Obstetrics                           Anesthesia Physical Anesthesia Plan  ASA: III  Anesthesia Plan: General ETT   Post-op Pain Management:    Induction: Intravenous  Airway Management Planned: Oral ETT  Additional Equipment: Arterial line  Intra-op Plan:   Post-operative Plan: Extubation in OR  Informed Consent: I have reviewed the patients History and Physical, chart, labs and discussed the procedure including the risks, benefits and alternatives for the proposed anesthesia with the patient or authorized representative who has indicated his/her understanding and acceptance.     Plan Discussed with: Anesthesiologist, CRNA and Surgeon  Anesthesia Plan Comments:         Anesthesia Quick Evaluation

## 2011-06-22 NOTE — Anesthesia Preprocedure Evaluation (Addendum)
Anesthesia Evaluation  Patient identified by MRN, date of birth, ID band  Reviewed: Allergy & Precautions, H&P , NPO status , Patient's Chart, lab work & pertinent test results  History of Anesthesia Complications Negative for: history of anesthetic complications  Airway       Dental   Pulmonary          Cardiovascular hypertension, On Medications and On Home Beta Blockers     Neuro/Psych  Headaches, PSYCHIATRIC DISORDERS Pt with post operative bleeding s/p meningioma reasection earlier today     GI/Hepatic   Endo/Other    Renal/GU      Musculoskeletal   Abdominal   Peds  Hematology   Anesthesia Other Findings   Reproductive/Obstetrics                           Anesthesia Physical Anesthesia Plan  ASA: III and Emergent  Anesthesia Plan: General   Post-op Pain Management:    Induction: Intravenous and Rapid sequence  Airway Management Planned: Oral ETT  Additional Equipment:   Intra-op Plan:   Post-operative Plan: Post-operative intubation/ventilation  Informed Consent:   History available from chart only  Plan Discussed with: Anesthesiologist, CRNA and Surgeon  Anesthesia Plan Comments: (Consent verbally obtained from husband by Dr. Venetia Maxon - Emergent surgery)        Anesthesia Quick Evaluation

## 2011-06-22 NOTE — Progress Notes (Signed)
Subjective: Patient reports less responsive and no longer following commands  Objective: Vital signs in last 24 hours: Temp:  [97.7 F (36.5 C)-98.5 F (36.9 C)] 97.7 F (36.5 C) (01/15 2000) Pulse Rate:  [39-76] 55  (01/15 2000) Resp:  [14-28] 17  (01/15 2000) BP: (124-140)/(56-64) 138/58 mmHg (01/15 2000) SpO2:  [89 %-96 %] 96 % (01/15 2000) Arterial Line BP: (130-155)/(58-73) 130/73 mmHg (01/15 2000) Weight:  [86.4 kg (190 lb 7.6 oz)] 86.4 kg (190 lb 7.6 oz) (01/15 1730)  Intake/Output from previous day:   Intake/Output this shift: Total I/O In: 75 [I.V.:75] Out: 700 [Urine:700]  Physical Exam: Sleepy, less arousable.  Not speaking.  Moves to noxious stimulus, right side greater than left  Lab Results: No results found for this basename: WBC:2,HGB:2,HCT:2,PLT:2 in the last 72 hours BMET No results found for this basename: NA:2,K:2,CL:2,CO2:2,GLUCOSE:2,BUN:2,CREATININE:2,CALCIUM:2 in the last 72 hours  Studies/Results: Ct Head Wo Contrast  06/22/2011  *RADIOLOGY REPORT*  Clinical Data: Altered mental status after resection of meningioma  CT HEAD WITHOUT CONTRAST  Technique:  Contiguous axial images were obtained from the base of the skull through the vertex without contrast.  Comparison: 05/28/2011  Findings: Evidence of right frontal parietal craniotomy noted with subcutaneous and subdural gas.  There is an area of right frontal edema and acute intraparenchymal hemorrhage measuring 4.7 x 3.3 cm. There is leftward midline shift of 4 mm.  Right hemispheric cerebral edema is noted with effacement of sulci.  There is also small amount of subdural acute hemorrhage.  Trace dependent left lateral ventricular acute hemorrhage is noted.  Orbits are unremarkable.  IMPRESSION: Right frontal postoperative change with intraparenchymal and subdural acute hemorrhage as above, with some mass effect upon the right cerebral hemisphere and 4 mm leftward midline shift.  Critical Value/emergent  results were called by telephone at the time of interpretation on 06/22/2011  at 8:43 p.m.  to  Dr. Venetia Maxon, who verbally acknowledged these results.  Original Report Authenticated By: Harrel Lemon, M.D.    Assessment/Plan: Patient is less arousable with an interval development of a right frontal intraparenchymal hematoma. In light of these findings have recommended we go back to surgery and evacuate this intraparenchymal hematoma. I called the patient's husband and spoke to him about this finding and my plan. I reviewed the patient's status with Dr. Franky Macho who also agrees with this plan. We will proceed with surgery on an emergent basis.    LOS: 0 days    Dorian Heckle, MD 06/22/2011, 9:05 PM

## 2011-06-22 NOTE — H&P (Addendum)
Name: Jacqueline Christian MRN: [id number] DOB: [birth year]    LOS: 0  PCCM ADMISSION NOTE  History of Present Illness:  66 y/o woman with a Hx of HTN admitted with dizziness and visual disturbance. 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 Subsequently Dr. Venetia Maxon (Neurosurgery) performed microdissection/debulking of hemangioma on 1/145/13 at 16:00 pm. At 21:00 the patient became unresponsive and a repeat imaging of her head demonstrated a postoperative subdural hematoma that required an emergent hematoma decompression. Patient tolerated second procedure well. PCCM was consulted for a continued ventilator management.  Lines / Drains: Left radial Aline 1/15>> NG/OGT 1/15>> ETT (7.5 mm) 1/15 >>  Cultures:   Antibiotics: Cefazolin 1/15/>>>  Tests / Events: CT w/o CM 1/15>> subdural hematoma MRI head 06/17/11 >> right anterior sphenoid wing meninigoma (with encasement of carotid artery and optic nerve) Hemangioma resection 1/15 Subdural hematoma evacuation 1/15  The patient is sedated, intubated and unable to provide history, which was obtained for available medical records.    Past Medical History  Diagnosis Date  . Recurrent sinus infections     just finished erythromycin dosing 06/18/11  . Hypertension   . Bladder spasms     takes oxybutinin  . Headache   . Dizziness   . Vision changes     r/t brain tumor  . Arthritis   . Depression     situational  . Neoplasm of brain    Past Surgical History  Procedure Date  . Total knee arthroplasty     R  . Abdominal hysterectomy   . Cystectomy     L wrist  . Stapedes surgery     repalced stapes  . Eye surgery 1977    retina repair  . Appendectomy    Prior to Admission medications   Medication Sig Start Date End Date Taking? Authorizing Provider  atenolol (TENORMIN) 25 MG tablet Take 25  mg by mouth daily.    Yes Historical Provider, MD  cetirizine (ZYRTEC) 10 MG tablet Take 10 mg by mouth daily as needed. For allergies   Yes Historical Provider, MD  dexamethasone (DECADRON) 4 MG tablet Take 4 mg by mouth 4 (four) times daily.   Yes Historical Provider, MD  famotidine (PEPCID) 20 MG tablet Take 20 mg by mouth 2 (two) times daily.   Yes Historical Provider, MD  naproxen sodium (ANAPROX) 220 MG tablet Take 440 mg by mouth daily as needed. For headache.    Yes Historical Provider, MD  oxybutynin (DITROPAN) 5 MG tablet Take 5 mg by mouth 2 (two) times daily.    Yes Historical Provider, MD   Allergies Allergies  Allergen Reactions  . Morphine And Related Other (See Comments)    Pt says it felt like her head was coming off.    Family History No family history on file.  Social History  reports that she has never smoked. She does not have any smokeless tobacco history on file. She reports that she does not drink alcohol or use illicit drugs.  Review Of Systems  Could not obtain, intubated  Vital Signs: Temp:  [97.7 F (36.5 C)-98.5 F (36.9 C)] 97.7 F (36.5 C) (01/15 2000) Pulse Rate:  [39-76] 54  (01/15 2259) Resp:  [14-28] 15  (01/15 2100) BP: (124-140)/(53-64) 132/53 mmHg (01/15 2100) SpO2:  [89 %-97 %] 97 % (01/15 2100) Arterial Line BP: (  130-155)/(58-73) 148/61 mmHg (01/15 2100) FiO2 (%):  [50 %] 50 % (01/15 2300) Weight:  [190 lb 7.6 oz (86.4 kg)] 190 lb 7.6 oz (86.4 kg) (01/15 1730) I/O last 3 completed shifts: In: 6471 [I.V.:3001; Blood:2065; IV Piggyback:1405] Out: 4855 [Urine:3055; Blood:1800]  Physical Examination: General:  Intubated, sedated Neuro:  Sedated on propofol HEENT:  Scalp dressing in place, pupils equal, round reactive to light, ETT in place Cardiovascular:  Sinus rhythm Lungs:  CTA B Abdomen:  BS+, soft, nontender, no hsm Skin: no rashes  Ventilator settings: Vent Mode:  [-] PRVC FiO2 (%):  [50 %] 50 % Set Rate:  [15 bmp] 15 bmp Vt  Set:  [500 mL] 500 mL PEEP:  [5 cmH20] 5 cmH20 Plateau Pressure:  [20 cmH20] 20 cmH20  Labs and Imaging:  Reviewed.  CBC:    Component Value Date/Time   WBC 11.5* 06/22/2011 2210   HGB 10.5* 06/22/2011 2210   HCT 30.4* 06/22/2011 2210   PLT 109* 06/22/2011 2210   MCV 86.4 06/22/2011 2210   BMET    Component Value Date/Time   NA 140 06/18/2011 1500   K 4.4 06/18/2011 1500   CL 103 06/18/2011 1500   CO2 27 06/18/2011 1500   GLUCOSE 125* 06/18/2011 1500   BUN 28* 06/18/2011 1500   CREATININE 0.90 06/18/2011 1500   CALCIUM 10.0 06/18/2011 1500   GFRNONAA 65* 06/18/2011 1500   GFRAA 76* 06/18/2011 1500    INR/Prothrombin Time:1.24   Assessment and Plan:  66 y/o female s/p resection of a meningioma who developed AMS due to an acute subdural and intracranial hemorrhage.  Now s/p craniotomy and hematoma evacuation.  NEUROLOGIC: 1. AMS due to continuous sedation with propofol. -daily WUA -RASS score -2  2. 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 -Hemodynamically stable -NS primary service -continue Cefazolin -continue Decadron -continue Keprra -head CT 06/23/11  PULMONARY: Post-operative ventilation management sp cranial surgery -continue current settings PRVC, FIO2 50%, PEEP 5, R15, TV 500 -ABG now and in am -PCXR shows bilateral effusions and edema but clinically doing well on vent and doesn't appear volume up on exam -f/u AM film and consider diuresis if effusions and edema persists   CARDIOVASCULAR: Hx of HTN.  Currently BP's are in lower normal range and adequately controlled.   RENAL: -Normal renal function -Monitor UO (Foley cath to gravity) -b-met in am  Lab 06/18/11 1500  NA 140  K 4.4  CL 103  CO2 27  GLUCOSE 125*  BUN 28*  CREATININE 0.90  CALCIUM 10.0  MG --  PHOS --    GASTROINTESTINAL: -Patient is with OGT and NPO -Will continue to assess nutritional needs -Daily  I/O's  HEMATOLOGIC: Anemia/thrombocytopenia, likely due to problems listed above. -Follow H:H -CBC  And INR in am -avoid heparin products -SCD's for DVT prophylaxis  Lab 06/22/11 2210 06/18/11 1500  HGB 10.5* 12.5  HCT 30.4* 35.9*  WBC 11.5* 18.1*  PLT 109* 230     Lab 06/22/11 2210  INR 1.24    Lab 06/22/11 2210  APTT 22*    INFECTIOUS: Patint is on empiric therapy with Cefazolin sp cranial surgery. Afebrile Will follow NS recs on duration -daily WBC's  Lab 06/22/11 2210 06/18/11 1500  WBC 11.5* 18.1*   No results found for this basename: PROCALCITON:5 in the last 168 hours   ENDOCRINE: No Hx of DM. -on Decadron therapy  -on ICU Hyperglycemia protocol  BEST PRACTICE /  DISPOSITION: -->  ICU status under PCCM -->  Full code -->  NPO -->  SCD's for DVT Px -->  Protonix IV for GI Px -->  Ventilator bundle -->  Family updated at bedside  The patient is critically ill with multiple organ systems failure and requires high complexity decision making for assessment and support, frequent evaluation and titration of therapies, application of advanced monitoring technologies and extensive interpretation of multiple databases. Critical Care Time devoted to patient care services described in this note is 45 minutes.  KARIMOVA,NODIRA 06/22/2011, 11:27 PM  I have seen and examined the patient and agree with Dr. Wendie Chess note above.  PCCM will follow for vent management  Max Fickle Pager [phone]

## 2011-06-22 NOTE — Op Note (Signed)
06/22/2011  3:56 PM  PATIENT:  Jacqueline Christian  66 y.o. female  PRE-OPERATIVE DIAGNOSIS:  Right sphenoid wing meningioma  POST-OPERATIVE DIAGNOSIS:  Right sphenoid wing meningioma  PROCEDURE:  Procedure(s): CRANIOTOMY TUMOR EXCISION with microdissection  SURGEON:  Surgeon(s): Dorian Heckle, MD Carmela Hurt  PHYSICIAN ASSISTANT:   ASSISTANTS: Georgiann Cocker RN   ANESTHESIA:   general  EBL:  Total I/O In: 5515 [I.V.:2200; Blood:2065; IV Piggyback:1250] Out: 4275 [Urine:2475; Blood:1800]  BLOOD ADMINISTERED:4 units CC PRBC  DRAINS: none   LOCAL MEDICATIONS USED:  LIDOCAINE 20 CC  SPECIMEN:  Excision  DISPOSITION OF SPECIMEN:  PATHOLOGY  COUNTS:  YES  TOURNIQUET:  * No tourniquets in log *  DICTATION: Patient is a 66 year old woman with right eye vision loss headaches and dizziness who was found to have a right sphenoid wing meningioma which appeared to compress the right optic nerve and in case the carotid artery middle cerebral and anterior cerebral arteries. This was causing significant mass effect and it was elected to take her to surgery for craniotomy with excision of this tumor.  Following the smooth and uncomplicated induction of general endotracheal anesthesia the patient was placed in the 3 pin head fixation her right frontotemporal parietal scalp was traced cephalad and head was turned 30 to the left of midline. Scalp was shaved after her head was registered with the Stealth neural navigation system and the preoperative MRI was utilized which had previously been prepared with scalp and tumor identified. Her right anterior neck was also draped for a possible exposure of the carotid artery within the neck event of hemorrhage. After prepping and draping in the usual sterile fashion the cranial incision was made and carried to the skull the temporalis muscle was also brought forward the scalp flap. The cranial incision was carried well past the midline to gain adequate  exposure to the interhemispheric fissure. After trephines were made a skull flap was elevated. In so doing there was an opening of the anteriormost aspect of the superior sagittal sinus as the dura was densely adherent to the bone and it was elected to sacrifice this anterior aspect of the sinus this was done with a variety of large ligaclips and 0 silk stitches. Subsequently the dura was then further opened and the sphenoid wing bone was removed with high-speed drill and later with Leksell rongeurs. Under microdissection technique the sylvian fissure was split and the tumor was identified. He was taking multi-hour tumor resection was then performed with the ultrasonic aspirator and suction cautery technique. Fortunately the tumor was fairly soft and this enabled removal and skeletonization of the internal carotid artery the right optic nerve the middle cerebral artery and the anterior cerebral artery on the right. The left optic nerve was also visualized. No vascular branches were compromised. Hemostasis was assured. The right third nerve was visualized as it entered the cavernous sinus. The dural base of the meningioma was cauterized with bipolar electrocautery and removed as much as possible. After the brain was inspected and hemostasis was assured dural tack up stitches were placed and a dural graft was placed overlying the area of removed dura. The bone flap was replaced with plates the temporalis fascia was reapproximated with 2-0 Vicryl sutures. The galea was reapproximated with 2-0 Vicryl sutures and the skin edges were reapproximated with staples. A sterile occlusive dressing and head wrap were applied and the patient was extubated in the operating room and taken to recovery having tolerated this surgery without apparent  difficulty. Counts were correct at the end of the case.  PLAN OF CARE: Admit to inpatient   PATIENT DISPOSITION:  PACU - hemodynamically stable.   Delay start of Pharmacological VTE  agent (>24hrs) due to surgical blood loss or risk of bleeding:  YES

## 2011-06-22 NOTE — Op Note (Signed)
06/22/2011  10:52 PM  PATIENT:  Jacqueline Christian  66 y.o. female  PRE-OPERATIVE DIAGNOSIS:  Hematoma - postoperative [998.12]  POST-OPERATIVE DIAGNOSIS:  Hematoma - postoperative [998.12]  PROCEDURE:  Procedure(s): CRANIOTOMY HEMATOMA EVACUATION SUBDURAL  SURGEON:  Surgeon(s): Dorian Heckle, MD Carmela Hurt  PHYSICIAN ASSISTANT:   ASSISTANTS: none   ANESTHESIA:   general  EBL:  Total I/O In: 150 [I.V.:150] Out: 1050 [Urine:800; Blood:250]  BLOOD ADMINISTERED:none  DRAINS: none   LOCAL MEDICATIONS USED:  NONE  SPECIMEN:  No Specimen  DISPOSITION OF SPECIMEN:  N/A  COUNTS:  YES  TOURNIQUET:  * No tourniquets in log *  DICTATION: Patient was brought to the operating room and intubated. A right frontal scalp was then prepped and draped in usual sterile fashion head was placed on doughnut head holder. Staples were removed and the previous incision was reopened Raney clips were applied and the bone flap was removed. A corticotomy was made in the right frontal lobe and intracerebral hematoma was evacuated. Hemostasis was assured and it was felt that the hematoma cavity had been significantly decompressed. The brain appeared pulsatile. Cotton balls soaked in saline were used to facilitate hemostasis and the pial edges were cauterized. The surgical site was lined with Surgicel. The dura was then closed and the bone flap was replaced with plates. Pulse fascia was reapproximated with 2-0 Vicryl stitches and the galea was reapproximated with 2-0 Vicryl stitches. The skin edges were reapproximated with staples. A sterile occlusive dressing was applied. The patient was taken to the ICU in stable condition still intubated. Counts were correct at the end of the case.  PLAN OF CARE: Admit to inpatient   PATIENT DISPOSITION:  PACU - hemodynamically stable.   Delay start of Pharmacological VTE agent (>24hrs) due to surgical blood loss or risk of bleeding:  YES

## 2011-06-22 NOTE — H&P (Addendum)
NEUROSURGICAL CONSULTATION  Jacqueline Christian  #[id number] DOB:  07-Jan-1946  June 09, 2011  HISTORY:     Jacqueline Christian is a new patient consultation I was asked to see on an urgent basis with dizziness and difficulty focusing her vision.  She is a 66 year old retired woman who went to her doctor in November with sinus issues and noted pressure behind her eyes and also difficulty focusing her vision.  She subsequently underwent a head CT as well as an MRI of her brain which have demonstrated a right an anterior clinoid-based meningioma which appears to be encasing the right carotid and circulation.  There also appears to be encasement of the right optic nerve.  This mass measures 20 x 24 x 31 mm.    There does not appear to be significant peritumoral edema related to this mass.  She currently is complaining of pressure over her head and eyes and noticed trouble focusing as of the first part of November.    REVIEW OF SYSTEMS:   A detailed Review of Systems sheet was reviewed with the patient.  Pertinent positives include glasses, hearing loss, ringing in ears, balance disturbance, nasal congestion, sinus problems, sinus headaches, problems with memory, double or blurred vision and depression.    PAST MEDICAL HISTORY:      Current Medical Conditions:    As previously described.  She has a history of hypertension.      Prior Operations and Hospitalizations:   Significant for right total knee replacement in 2005, hysterectomy in 1987.  She underwent right total knee replacement in 2001 and has had a left wrist cyst removed in 1978, right TMJ surgery in 1983.      Medications and Allergies:      Height and Weight:     She is 5'3" tall and 174 pounds.       SOCIAL HISTORY:    She denies tobacco, alcohol or drug use.     DIAGNOSTIC STUDIES:   As previously described.    PHYSICAL EXAMINATION:      General Appearance:   On examination today, Jacqueline Christian is a pleasant and cooperative woman in no acute  distress.      Blood Pressure, Pulse, Respirations:   90/72, heart rate 76 and regular, respirations 18.     HEENT - normocephalic, atraumatic.  The pupils are equal, round and reactive to light.  The extraocular muscles are intact.  Sclerae - white.  Conjunctiva - pink.  Oropharynx benign.  Uvula midline.     Neck - there are no masses, meningismus, deformities, tracheal deviation, jugular vein distention or carotid bruits.  There is normal cervical range of motion.  Spurlings' test is negative without reproducible radicular pain turning the patient's head to either side.  Lhermitte's sign is not present with axial compression.      Respiratory - there is normal respiratory effort with good intercostal function.  Lungs are clear to auscultation.  There are no rales, rhonchi or wheezes.      Cardiovascular - the heart has regular rate and rhythm to auscultation.  No murmurs are appreciated.  There is no extremity edema, cyanosis or clubbing.  There are palpable pedal pulses.      Abdomen - soft, nontender, no hepatosplenomegaly appreciated or masses.  There are active bowel sounds.  No guarding or rebound.      Musculoskeletal Examination - the patient is able to walk about the examining room with a normal, casual, heel and toe gait.  NEUROLOGICAL EXAMINATION: The patient is oriented to time, person and place and has good recall of both recent and remote memory with normal attention span and concentration.  The patient speaks with clear and fluent speech and exhibits normal language function and appropriate fund of knowledge.      Cranial Nerve Examination - there is some decreased visual field in the right inferior nasal field.  She has optic pallor on the right on funduscopic examination.  She is not able to read even large writing with her right eye but is able to see very large block letters with correction.  She counts fingers but says these are blurred in the right eye.  She is able to read  with correction without difficulty with the left eye.      Motor Examination - motor strength is 5/5 in the bilateral deltoids, biceps, triceps, handgrips, wrist extensors, interosseous.  In the lower extremities motor strength is 5/5 in hip flexion, extension, quadriceps, hamstrings, plantar flexion, dorsiflexion and extensor hallucis longus.      Sensory Examination - normal to light touch and pinprick sensation in the upper and lower extremities.     Deep Tendon Reflexes - 2 in the biceps, triceps, and brachioradialis, 2 in the knees, 2 in the ankles.  The great toes are downgoing to plantar stimulation.  No pathologic reflexes.       Cerebellar Examination - normal coordination in upper and lower extremities and normal rapid alternating movements.  Romberg test is negative.    IMPRESSION AND RECOMMENDATIONS: Jacqueline Christian is a 66 year old woman with a right-sided anterior clinoid based meningioma.  I explained to her that this is a problematic tumor although it appears to be benign in that it is causing direct significant compression of the carotid artery and its branches as well as the optic nerve.  We may be able to debulk it but may not be able to remove it in its entirety.  It does not appear to be causing a lot of brain reaction so I have told her that I do not think that it appears to be growing rapidly and I told her that her vision is at risk as is the possibility of her having a stroke on the right side of her brain.  I would like for her to have a formal ophthalmologic evaluation and I would also like for her to have a Stealth MRI to better guide [place] with surgical resection.  I explained that depending on the consistency of the tumor this may be very firm and impossible to aggressively debulk but it also may be softer and easier to remove.  We will not know that until we do the surgery.  Risks and benefits were discussed in great detail with the patient and her family and she wishes to proceed  with surgery.  I am going to start her on Decadron 4 mg. t.i.d. 5 days prior to surgery.    [facility]    Danae Orleans. Venetia Maxon, M.D.  JDS:gde  cc: Dr. Wyvonnia Lora    Date of Initial H&P: 06/09/2011  History reviewed, patient examined, no change in status, stable for surgery.

## 2011-06-22 NOTE — Anesthesia Postprocedure Evaluation (Signed)
  Anesthesia Post-op Note  Patient: Jacqueline Christian  Procedure(s) Performed:  CRANIOTOMY TUMOR EXCISION - RIGHT pterional Craniotomy for meningioma  Patient Location: PACU  Anesthesia Type: General  Level of Consciousness: sedated and unresponsive  Airway and Oxygen Therapy: Patient Spontanous Breathing and Patient connected to nasal cannula oxygen  Post-op Pain: none  Post-op Assessment: Post-op Vital signs reviewed, Patient's Cardiovascular Status Stable, Respiratory Function Stable, Patent Airway, No signs of Nausea or vomiting and Pain level controlled  Post-op Vital Signs: stable  Complications: No apparent anesthesia complications

## 2011-06-23 ENCOUNTER — Inpatient Hospital Stay (HOSPITAL_COMMUNITY): Payer: Medicare Other

## 2011-06-23 DIAGNOSIS — I10 Essential (primary) hypertension: Secondary | ICD-10-CM

## 2011-06-23 DIAGNOSIS — J9600 Acute respiratory failure, unspecified whether with hypoxia or hypercapnia: Secondary | ICD-10-CM

## 2011-06-23 DIAGNOSIS — I6200 Nontraumatic subdural hemorrhage, unspecified: Secondary | ICD-10-CM

## 2011-06-23 LAB — BLOOD GAS, ARTERIAL
Acid-base deficit: 3.5 mmol/L — ABNORMAL HIGH (ref 0.0–2.0)
Acid-base deficit: 2.2 mmol/L — ABNORMAL HIGH (ref 0.0–2.0)
Bicarbonate: 20.8 mEq/L (ref 20.0–24.0)
Drawn by: 347641
FIO2: 0.5 %
FIO2: 0.4 %
MECHVT: 500 mL
O2 Saturation: 99.2 %
RATE: 15 resp/min
TCO2: 21.9 mmol/L (ref 0–100)
TCO2: 22.7 mmol/L (ref 0–100)
pCO2 arterial: 36 mmHg (ref 35.0–45.0)
pCO2 arterial: 34.2 mmHg — ABNORMAL LOW (ref 35.0–45.0)
pH, Arterial: 7.38 (ref 7.350–7.400)
pO2, Arterial: 164 mmHg — ABNORMAL HIGH (ref 80.0–100.0)

## 2011-06-23 LAB — COMPREHENSIVE METABOLIC PANEL
ALT: 54 U/L — ABNORMAL HIGH (ref 0–35)
AST: 31 U/L (ref 0–37)
CO2: 21 mEq/L (ref 19–32)
Calcium: 7.8 mg/dL — ABNORMAL LOW (ref 8.4–10.5)
Chloride: 112 mEq/L (ref 96–112)
GFR calc Af Amer: >90 mL/min (ref >90)
GFR calc non Af Amer: 88 mL/min — ABNORMAL LOW (ref >90)
Glucose, Bld: 166 mg/dL — ABNORMAL HIGH (ref 70–99)
Sodium: 144 mEq/L (ref 135–145)
Total Bilirubin: 0.9 mg/dL (ref 0.3–1.2)

## 2011-06-23 LAB — GLUCOSE, CAPILLARY
Glucose-Capillary: 159 mg/dL — ABNORMAL HIGH (ref 70–99)
Glucose-Capillary: 149 mg/dL — ABNORMAL HIGH (ref 70–99)
Glucose-Capillary: 128 mg/dL — ABNORMAL HIGH (ref 70–99)
Glucose-Capillary: 133 mg/dL — ABNORMAL HIGH (ref 70–99)

## 2011-06-23 LAB — CBC
Hemoglobin: 11 g/dL — ABNORMAL LOW (ref 12.0–15.0)
MCH: 30 pg (ref 26.0–34.0)
MCV: 86.6 fL (ref 78.0–100.0)
RBC: 3.67 MIL/uL — ABNORMAL LOW (ref 3.87–5.11)
WBC: 13.8 10*3/uL — ABNORMAL HIGH (ref 4.0–10.5)

## 2011-06-23 LAB — PROTIME-INR
INR: 1.22 (ref 0.00–1.49)
Prothrombin Time: 15.7 seconds — ABNORMAL HIGH (ref 11.6–15.2)

## 2011-06-23 LAB — PREPARE FRESH FROZEN PLASMA

## 2011-06-23 MED ORDER — BIOTENE DRY MOUTH MT LIQD
15.0000 mL | Freq: Four times a day (QID) | OROMUCOSAL | Status: DC
  Administered 2011-06-23 – 2011-07-07 (×55): 15 mL via OROMUCOSAL

## 2011-06-23 MED ORDER — CHLORHEXIDINE GLUCONATE 0.12 % MT SOLN
15.0000 mL | Freq: Two times a day (BID) | OROMUCOSAL | Status: DC
  Administered 2011-06-23 – 2011-07-07 (×29): 15 mL via OROMUCOSAL
  Filled 2011-06-23 (×30): qty 15

## 2011-06-23 NOTE — Progress Notes (Signed)
Speech Language/Pathology  Pt. Currently on ventilator.  Received order for speech-cognitive eval.  ST to sign off. Please reconsult if appropriate.  Breck Coons Alexander.Ed [employer] [phone]  06/23/2011

## 2011-06-23 NOTE — Progress Notes (Signed)
UR COMPLETED  

## 2011-06-23 NOTE — Anesthesia Postprocedure Evaluation (Signed)
  Anesthesia Post-op Note  Patient: Jacqueline Christian  Procedure(s) Performed:  CRANIOTOMY HEMATOMA EVACUATION SUBDURAL  Patient Location: ICU  Anesthesia Type: General  Level of Consciousness: sedated  Airway and Oxygen Therapy: Patient placed on Ventilator (see vital sign flow sheet for setting)  Post-op Pain: unable to assess r/t pt sedation/intubated  Post-op Assessment: Post-op Vital signs reviewed, Patient's Cardiovascular Status Stable, Respiratory Function Stable and Patent Airway  Post-op Vital Signs: stable  Complications: No apparent anesthesia complications

## 2011-06-23 NOTE — Progress Notes (Signed)
PT Cancellation Note  Evaluation cancelled as pt is sedated and ventilated, RN reporting pt will likely remain ventilated for rest of today. Pt also on bedrest. Will sign off PT, please reorder when pt medically ready for activity. Thank you!  Kenae Lindquist (Beverely Pace) Carleene Mains PT, DPT Acute Rehabilitation [phone]

## 2011-06-23 NOTE — Progress Notes (Signed)
HPI:  66 y/o woman with a Hx of HTN admitted with dizziness and visual disturbance. 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 Subsequently Dr. Venetia Maxon (Neurosurgery) performed microdissection/debulking of hemangioma on 1/145/13 at 16:00 pm. At 21:00 the patient became unresponsive and a repeat imaging of her head demonstrated a postoperative subdural hematoma that required an emergent hematoma decompression. Patient tolerated second procedure well. PCCM was consulted for a continued ventilator management.  Antibiotics:   Cefazolin 1/15/>>>  Cultures/Sepsis Markers:   None  Access/Protocols:  Left radial Aline 1/15>>  NG/OGT 1/15>>  ETT (7.5 mm) 1/15 >>  Best Practice: DVT: Coagulopathic, SCDs GI: Protonix  Subjective: Post-op, sedated and intubated.  Physical Exam: Filed Vitals:   06/23/11 1240  BP: 171/66  Pulse: 63  Temp:   Resp: 15    Intake/Output Summary (Last 24 hours) at 06/23/11 1325 Last data filed at 06/23/11 1200  Gross per 24 hour  Intake 5387.7 ml  Output   4255 ml  Net 1132.7 ml   Vent Mode:  [-] PRVC FiO2 (%):  [40 %-50.2 %] 40.1 % Set Rate:  [15 bmp] 15 bmp Vt Set:  [500 mL] 500 mL PEEP:  [5 cmH20] 5 cmH20 Plateau Pressure:  [20 cmH20-26 cmH20] 22 cmH20  General: Intubated, sedated  Neuro: Sedated on propofol  HEENT: Scalp dressing in place, pupils equal, round reactive to light, ETT in place  Cardiovascular: Sinus rhythm  Lungs: CTA B  Abdomen: BS+, soft, nontender, no hsm  Skin: no rashes  Labs: CBC    Component Value Date/Time   WBC 13.8* 06/23/2011 0430   RBC 3.67* 06/23/2011 0430   HGB 11.0* 06/23/2011 0430   HCT 31.8* 06/23/2011 0430   PLT 109* 06/23/2011 0430   MCV 86.6 06/23/2011 0430   MCH 30.0 06/23/2011 0430   MCHC 34.6 06/23/2011 0430   RDW 14.9 06/23/2011 0430   BMET    Component Value  Date/Time   NA 144 06/23/2011 0430   K 3.6 06/23/2011 0430   CL 112 06/23/2011 0430   CO2 21 06/23/2011 0430   GLUCOSE 166* 06/23/2011 0430   BUN 18 06/23/2011 0430   CREATININE 0.71 06/23/2011 0430   CALCIUM 7.8* 06/23/2011 0430   GFRNONAA 88* 06/23/2011 0430   GFRAA >90 06/23/2011 0430   ABG    Component Value Date/Time   PHART 7.417* 06/23/2011 0326   PCO2ART 34.2* 06/23/2011 0326   PO2ART 138.0* 06/23/2011 0326   HCO3 21.7 06/23/2011 0326   TCO2 22.7 06/23/2011 0326   ACIDBASEDEF 2.2* 06/23/2011 0326   O2SAT 99.2 06/23/2011 0326   No results found for this basename: MG in the last 168 hours Lab Results  Component Value Date   CALCIUM 7.8* 06/23/2011    Chest Xray: bibasilar atelectasis with effusion and pulmonary edema, ET tube ok.  Assessment & Plan: 66 y/o female s/p resection of a meningioma who developed AMS due to an acute subdural and intracranial hemorrhage. Now s/p craniotomy and hematoma evacuation.   NEUROLOGIC:  1. AMS due to continuous sedation with propofol.  -daily WUA  -RASS score -2  2. 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  -Hemodynamically stable  -NS primary service  -continue Cefazolin  -continue Decadron  -continue Keprra  -head CT 06/23/11   PULMONARY:  Post-operative ventilation management  sp cranial surgery  -continue current settings PRVC, FIO2 50%, PEEP 5, R15, TV 500  -ABG in am  -PCXR shows bilateral effusions and edema but clinically doing well on vent and doesn't appear volume up on exam, will not address edema and effusion at this time. -Hold diureses until patient is stable from a neurologic edema to not affect fluid shift at this time, will keep even from a fluid standpoint.  CARDIOVASCULAR:  Hx of HTN.  Currently BP's are in lower normal range and adequately controlled.   RENAL:  -Normal renal function  -Monitor UO (Foley cath to gravity)  -b-met in am    GASTROINTESTINAL:  -Patient is with OGT and NPO  -If not extubatable by 1/17 will consult nutrition for TF. -Daily I/O's   HEMATOLOGIC:  Anemia/thrombocytopenia, likely due to problems listed above.  -Follow H:H  -CBC And INR in am  -No heparin products  -SCD's for DVT prophylaxis   Lab  06/22/11 2210  06/18/11 1500   HGB  10.5*  12.5   HCT  30.4*  35.9*   WBC  11.5*  18.1*   PLT  109*  230     Lab  06/22/11 2210   INR  1.24     Lab  06/22/11 2210   APTT  22*    INFECTIOUS:  Patint is on empiric therapy with Cefazolin sp cranial surgery.  Afebrile  Will follow NS recs on duration  -daily WBC's   Lab  06/22/11 2210  06/18/11 1500   WBC  11.5*  18.1*    ENDOCRINE:  No Hx of DM.  -on Decadron therapy  -on ICU Hyperglycemia protocol   The patient is critically ill with multiple organ systems failure and requires high complexity decision making for assessment and support, frequent evaluation and titration of therapies, application of advanced monitoring technologies and extensive interpretation of multiple databases. Critical Care Time devoted to patient care services described in this note is 35 minutes.  Koren Bound, MD [phone]

## 2011-06-23 NOTE — Progress Notes (Signed)
Subjective: Patient reports sedated on vent  Objective: Vital signs in last 24 hours: Temp:  [97.7 F (36.5 C)-98.6 F (37 C)] 98.6 F (37 C) (01/16 0333) Pulse Rate:  [47-76] 48  (01/16 0700) Resp:  [14-28] 15  (01/16 0700) BP: (101-162)/(45-68) 118/54 mmHg (01/16 0700) SpO2:  [89 %-100 %] 100 % (01/16 0700) Arterial Line BP: (98-158)/(50-73) 158/62 mmHg (01/16 0400) FiO2 (%):  [40 %-50.2 %] 40 % (01/16 0700) Weight:  [86.4 kg (190 lb 7.6 oz)] 86.4 kg (190 lb 7.6 oz) (01/15 1730)  Intake/Output from previous day: 01/15 0701 - 01/16 0700 In: 8262.7 [I.V.:4437.7; Blood:2065; IV Piggyback:1510] Out: 6910 [Urine:4860; Blood:2050] Intake/Output this shift:    Physical Exam: Not following commands (but sedated) Dressing CDI.  PERRL, MAE, right side greater than left.  Lab Results:  Basename 06/23/11 0430 06/22/11 2210  WBC 13.8* 11.5*  HGB 11.0* 10.5*  HCT 31.8* 30.4*  PLT 109* 109*   BMET  Basename 06/23/11 0430  NA 144  K 3.6  CL 112  CO2 21  GLUCOSE 166*  BUN 18  CREATININE 0.71  CALCIUM 7.8*    Studies/Results: Ct Head Wo Contrast  06/22/2011  *RADIOLOGY REPORT*  Clinical Data: Altered mental status after resection of meningioma  CT HEAD WITHOUT CONTRAST  Technique:  Contiguous axial images were obtained from the base of the skull through the vertex without contrast.  Comparison: 05/28/2011  Findings: Evidence of right frontal parietal craniotomy noted with subcutaneous and subdural gas.  There is an area of right frontal edema and acute intraparenchymal hemorrhage measuring 4.7 x 3.3 cm. There is leftward midline shift of 4 mm.  Right hemispheric cerebral edema is noted with effacement of sulci.  There is also small amount of subdural acute hemorrhage.  Trace dependent left lateral ventricular acute hemorrhage is noted.  Orbits are unremarkable.  IMPRESSION: Right frontal postoperative change with intraparenchymal and subdural acute hemorrhage as above, with some  mass effect upon the right cerebral hemisphere and 4 mm leftward midline shift.  Critical Value/emergent results were called by telephone at the time of interpretation on 06/22/2011  at 8:43 p.m.  to  Dr. Venetia Maxon, who verbally acknowledged these results.  Original Report Authenticated By: Harrel Lemon, M.D.   Dg Chest Port 1 View  06/23/2011  *RADIOLOGY REPORT*  Clinical Data: Intubation  PORTABLE CHEST - 1 VIEW  Comparison: 07/21/2005  Findings: Endotracheal tube 2.4 cm above the carina.  NG tube inserted entering the stomach with the tip not visualized.  Central vascular congestion with basilar atelectasis and likely posterior layering effusions.  No pneumothorax.  Dense left lower lobe retrocardiac consolidation/collapse.  IMPRESSION: Endotracheal tube 2.4 cm above the carina.  Vascular congestion, basilar atelectasis and effusions.  Dense left lower lobe retrocardiac consolidation/collapse.  Original Report Authenticated By: Judie Petit. Ruel Favors, M.D.    Assessment/Plan: Head CT improved, mild coagulopathy, will transfuse FFP.  Continue ventilator and sedation today and if labs and CT stable, will wean sedation in am.    LOS: 1 day    Dorian Heckle, MD 06/23/2011, 7:22 AM

## 2011-06-24 ENCOUNTER — Inpatient Hospital Stay (HOSPITAL_COMMUNITY): Payer: Medicare Other

## 2011-06-24 ENCOUNTER — Encounter (HOSPITAL_COMMUNITY): Payer: Self-pay | Admitting: Neurosurgery

## 2011-06-24 DIAGNOSIS — J9600 Acute respiratory failure, unspecified whether with hypoxia or hypercapnia: Secondary | ICD-10-CM

## 2011-06-24 DIAGNOSIS — I6200 Nontraumatic subdural hemorrhage, unspecified: Secondary | ICD-10-CM

## 2011-06-24 DIAGNOSIS — I10 Essential (primary) hypertension: Secondary | ICD-10-CM

## 2011-06-24 LAB — BLOOD GAS, ARTERIAL
MECHVT: 500 mL
PEEP: 5 cmH2O
Patient temperature: 98.6
TCO2: 25.8 mmol/L (ref 0–100)
pCO2 arterial: 35 mmHg (ref 35.0–45.0)
pH, Arterial: 7.464 — ABNORMAL HIGH (ref 7.350–7.400)

## 2011-06-24 LAB — CBC
HCT: 29.9 % — ABNORMAL LOW (ref 36.0–46.0)
MCHC: 33.8 g/dL (ref 30.0–36.0)
MCV: 88.2 fL (ref 78.0–100.0)
Platelets: 108 10*3/uL — ABNORMAL LOW (ref 150–400)
RDW: 15 % (ref 11.5–15.5)
WBC: 16.8 10*3/uL — ABNORMAL HIGH (ref 4.0–10.5)

## 2011-06-24 LAB — GLUCOSE, CAPILLARY
Glucose-Capillary: 124 mg/dL — ABNORMAL HIGH (ref 70–99)
Glucose-Capillary: 148 mg/dL — ABNORMAL HIGH (ref 70–99)
Glucose-Capillary: 149 mg/dL — ABNORMAL HIGH (ref 70–99)

## 2011-06-24 LAB — PROTIME-INR
INR: 1.18 (ref 0.00–1.49)
Prothrombin Time: 15.2 seconds (ref 11.6–15.2)

## 2011-06-24 LAB — PREPARE PLATELET PHERESIS: Unit division: 0

## 2011-06-24 LAB — BASIC METABOLIC PANEL
BUN: 22 mg/dL (ref 6–23)
CO2: 23 mEq/L (ref 19–32)
Calcium: 8.1 mg/dL — ABNORMAL LOW (ref 8.4–10.5)
Chloride: 111 mEq/L (ref 96–112)
Creatinine, Ser: 0.6 mg/dL (ref 0.50–1.10)

## 2011-06-24 LAB — MAGNESIUM: Magnesium: 2.3 mg/dL (ref 1.5–2.5)

## 2011-06-24 LAB — PHOSPHORUS: Phosphorus: 1.7 mg/dL — ABNORMAL LOW (ref 2.3–4.6)

## 2011-06-24 MED ORDER — DEXAMETHASONE SODIUM PHOSPHATE 4 MG/ML IJ SOLN
6.0000 mg | Freq: Four times a day (QID) | INTRAMUSCULAR | Status: AC
  Administered 2011-06-24 – 2011-06-25 (×4): 6 mg via INTRAVENOUS
  Filled 2011-06-24: qty 2
  Filled 2011-06-24: qty 1
  Filled 2011-06-24: qty 2

## 2011-06-24 MED ORDER — DEXAMETHASONE SODIUM PHOSPHATE 10 MG/ML IJ SOLN
10.0000 mg | Freq: Once | INTRAMUSCULAR | Status: AC
  Administered 2011-06-24: 10 mg via INTRAVENOUS

## 2011-06-24 MED ORDER — DEXAMETHASONE SODIUM PHOSPHATE 10 MG/ML IJ SOLN
INTRAMUSCULAR | Status: AC
  Administered 2011-06-24: 10 mg via INTRAVENOUS
  Filled 2011-06-24: qty 1

## 2011-06-24 MED ORDER — MANNITOL 25 % IV SOLN
INTRAVENOUS | Status: AC
  Administered 2011-06-24: 37.5 g
  Filled 2011-06-24: qty 150

## 2011-06-24 MED ORDER — POTASSIUM PHOSPHATE DIBASIC 3 MMOLE/ML IV SOLN
30.0000 mmol | Freq: Once | INTRAVENOUS | Status: AC
  Administered 2011-06-24: 30 mmol via INTRAVENOUS
  Filled 2011-06-24: qty 10

## 2011-06-24 MED ORDER — PROMOTE PO LIQD
20.0000 mL/h | Freq: Every day | ORAL | Status: DC
  Administered 2011-06-24 – 2011-06-27 (×3): 20 mL/h
  Filled 2011-06-24 (×6): qty 1000

## 2011-06-24 MED ORDER — PRO-STAT SUGAR FREE PO LIQD
30.0000 mL | Freq: Three times a day (TID) | ORAL | Status: DC
  Administered 2011-06-24 – 2011-06-27 (×11): 30 mL
  Filled 2011-06-24 (×14): qty 30

## 2011-06-24 MED ORDER — MANNITOL 25 % IV SOLN: 37.5000 g/h | INTRAVENOUS | Status: DC

## 2011-06-24 NOTE — Progress Notes (Signed)
HPI:  66 y/o woman with a Hx of HTN admitted with dizziness and visual disturbance. 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 Subsequently Dr. Venetia Maxon (Neurosurgery) performed microdissection/debulking of hemangioma on 1/145/13 at 16:00 pm. At 21:00 the patient became unresponsive and a repeat imaging of her head demonstrated a postoperative subdural hematoma that required an emergent hematoma decompression. Patient tolerated second procedure well. PCCM was consulted for a continued ventilator management.  Antibiotics:   Cefazolin 1/15/>>>  Cultures/Sepsis Markers:   None  Access/Protocols:  Left radial Aline 1/15>>  NG/OGT 1/15>>  ETT (7.5 mm) 1/15 >>  Best Practice: DVT: Coagulopathic, SCDs GI: Protonix  Subjective: Post-op, sedated and intubated.  Physical Exam: Filed Vitals:   06/24/11 0900  BP: 131/63  Pulse: 61  Temp:   Resp: 20    Intake/Output Summary (Last 24 hours) at 06/24/11 1137 Last data filed at 06/24/11 0900  Gross per 24 hour  Intake 2177.9 ml  Output   1025 ml  Net 1152.9 ml   Vent Mode:  [-] PRVC FiO2 (%):  [39.8 %-98.8 %] 39.8 % Set Rate:  [15 bmp] 15 bmp Vt Set:  [500 mL] 500 mL PEEP:  [4.5 cmH20-7.5 cmH20] 5 cmH20 Plateau Pressure:  [18 cmH20-22 cmH20] 21 cmH20  General: Intubated, sedated  Neuro: Sedated on propofol  HEENT: Scalp dressing in place, pupils equal, round reactive to light, ETT in place  Cardiovascular: Sinus rhythm  Lungs: CTA B  Abdomen: BS+, soft, nontender, no hsm  Skin: no rashes  Labs: CBC    Component Value Date/Time   WBC 16.8* 06/24/2011 0355   RBC 3.39* 06/24/2011 0355   HGB 10.1* 06/24/2011 0355   HCT 29.9* 06/24/2011 0355   PLT 108* 06/24/2011 0355   MCV 88.2 06/24/2011 0355   MCH 29.8 06/24/2011 0355   MCHC 33.8 06/24/2011 0355   RDW 15.0 06/24/2011 0355   BMET      Component Value Date/Time   NA 143 06/24/2011 0355   K 3.3* 06/24/2011 0355   CL 111 06/24/2011 0355   CO2 23 06/24/2011 0355   GLUCOSE 137* 06/24/2011 0355   BUN 22 06/24/2011 0355   CREATININE 0.60 06/24/2011 0355   CALCIUM 8.1* 06/24/2011 0355   GFRNONAA >90 06/24/2011 0355   GFRAA >90 06/24/2011 0355   ABG    Component Value Date/Time   PHART 7.464* 06/24/2011 0355   PCO2ART 35.0 06/24/2011 0355   PO2ART 109.0* 06/24/2011 0355   HCO3 24.8* 06/24/2011 0355   TCO2 25.8 06/24/2011 0355   ACIDBASEDEF 2.2* 06/23/2011 0326   O2SAT 98.9 06/24/2011 0355    Lab 06/24/11 0355  MG 2.3   Lab Results  Component Value Date   CALCIUM 8.1* 06/24/2011   PHOS 1.7* 06/24/2011    Chest Xray: bibasilar atelectasis with effusion and pulmonary edema, ET tube ok.  Assessment & Plan: 66 y/o female s/p resection of a meningioma who developed AMS due to an acute subdural and intracranial hemorrhage. Now s/p craniotomy and hematoma evacuation.   NEUROLOGIC:  1. AMS due to continuous sedation with propofol.  Extension of the bleed. - Daily WUA  - RASS score -2   2. 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  - Hemodynamically stable  - NS primary service.  - Continue Cefazolin. - Continue Decadron. -  Continue Keprra. - Head CT 06/23/11.  PULMONARY:  Post-operative ventilation management sp cranial surgery  - Continue current settings PRVC, FIO2 50%, PEEP 5, R15, TV 500, will not wean. - ABG in am  - PCXR shows bilateral effusions and edema but clinically doing well on vent and doesn't appear volume up on exam, will not address edema and effusion at this time. - Hold diureses until patient is stable from a neurologic edema to not affect fluid shift at this time, will keep even from a fluid standpoint. - KVO IVF as hyperchloremia is starting to affect acid/base.  CARDIOVASCULAR: Bradycardia but normotensive, if becomes hypotensive  will add dopamine.  Insert TLC today. - Hx of HTN.  - Currently BP's are in lower normal range and adequately controlled.   RENAL:  - Normal renal function. - Replace K and Phos. - Monitor UO (Foley cath to gravity). - B-met in am.  GASTROINTESTINAL:  - Patient is with OGT and NPO  - Consult nutrition for TF. - Daily I/O's.  HEMATOLOGIC:  Anemia/thrombocytopenia, likely due to problems listed above.  -Follow H:H  -CBC And INR in am  -No heparin products  -SCD's for DVT prophylaxis   Lab  06/22/11 2210  06/18/11 1500   HGB  10.5*  12.5   HCT  30.4*  35.9*   WBC  11.5*  18.1*   PLT  109*  230     Lab  06/22/11 2210   INR  1.24     Lab  06/22/11 2210   APTT  22*    INFECTIOUS:  Patint is on empiric therapy with Cefazolin sp cranial surgery.  Afebrile  Will follow NS recs on duration  -daily WBC's   Lab  06/22/11 2210  06/18/11 1500   WBC  11.5*  18.1*    ENDOCRINE:  No Hx of DM.  -on Decadron therapy  -on ICU Hyperglycemia protocol   The patient is critically ill with multiple organ systems failure and requires high complexity decision making for assessment and support, frequent evaluation and titration of therapies, application of advanced monitoring technologies and extensive interpretation of multiple databases. Critical Care Time devoted to patient care services described in this note is 35 minutes.  Koren Bound, MD [phone]

## 2011-06-24 NOTE — Progress Notes (Signed)
Subjective: Patient reports ventilated and sedated  Objective: Vital signs in last 24 hours: Temp:  [99.3 F (37.4 C)-100.1 F (37.8 C)] 99.5 F (37.5 C) (01/17 0000) Pulse Rate:  [44-76] 44  (01/17 0600) Resp:  [14-18] 15  (01/17 0600) BP: (108-171)/(43-66) 117/54 mmHg (01/17 0600) SpO2:  [98 %-100 %] 100 % (01/17 0600) Arterial Line BP: (136-144)/(62-82) 136/82 mmHg (01/16 2200) FiO2 (%):  [39.8 %-98.8 %] 40 % (01/17 0600)  Intake/Output from previous day: 01/16 0701 - 01/17 0700 In: 2197.2 [I.V.:1799.7; Blood:387.5; IV Piggyback:10] Out: 860 [Urine:860] Intake/Output this shift: Total I/O In: 959.7 [I.V.:949.7; IV Piggyback:10] Out: 465 [Urine:465]  Dressing CDI.  PERRL, not F/C, but sedated.  Moves to stimulus, right greater than left side  Lab Results:  Basename 06/24/11 0355 06/23/11 0430  WBC 16.8* 13.8*  HGB 10.1* 11.0*  HCT 29.9* 31.8*  PLT 108* 109*   BMET  Basename 06/24/11 0355 06/23/11 0430  NA 143 144  K 3.3* 3.6  CL 111 112  CO2 23 21  GLUCOSE 137* 166*  BUN 22 18  CREATININE 0.60 0.71  CALCIUM 8.1* 7.8*    Studies/Results: Ct Head Wo Contrast  06/22/2011  *RADIOLOGY REPORT*  Clinical Data: Altered mental status after resection of meningioma  CT HEAD WITHOUT CONTRAST  Technique:  Contiguous axial images were obtained from the base of the skull through the vertex without contrast.  Comparison: 05/28/2011  Findings: Evidence of right frontal parietal craniotomy noted with subcutaneous and subdural gas.  There is an area of right frontal edema and acute intraparenchymal hemorrhage measuring 4.7 x 3.3 cm. There is leftward midline shift of 4 mm.  Right hemispheric cerebral edema is noted with effacement of sulci.  There is also small amount of subdural acute hemorrhage.  Trace dependent left lateral ventricular acute hemorrhage is noted.  Orbits are unremarkable.  IMPRESSION: Right frontal postoperative change with intraparenchymal and subdural acute  hemorrhage as above, with some mass effect upon the right cerebral hemisphere and 4 mm leftward midline shift.  Critical Value/emergent results were called by telephone at the time of interpretation on 06/22/2011  at 8:43 p.m.  to  Dr. Venetia Maxon, who verbally acknowledged these results.  Original Report Authenticated By: Harrel Lemon, M.D.   Dg Chest Port 1 View  06/23/2011  *RADIOLOGY REPORT*  Clinical Data: Intubation  PORTABLE CHEST - 1 VIEW  Comparison: 07/21/2005  Findings: Endotracheal tube 2.4 cm above the carina.  NG tube inserted entering the stomach with the tip not visualized.  Central vascular congestion with basilar atelectasis and likely posterior layering effusions.  No pneumothorax.  Dense left lower lobe retrocardiac consolidation/collapse.  IMPRESSION: Endotracheal tube 2.4 cm above the carina.  Vascular congestion, basilar atelectasis and effusions.  Dense left lower lobe retrocardiac consolidation/collapse.  Original Report Authenticated By: Judie Petit. Ruel Favors, M.D.   Ct Portable Head W/o Cm  06/24/2011  *RADIOLOGY REPORT*  Clinical Data: Follow up intracranial hematoma.  CT HEAD WITHOUT CONTRAST  Technique:  Contiguous axial images were obtained from the base of the skull through the vertex without contrast.  Comparison: CT of the head performed 06/23/2011  Findings:   There has been interval reaccumulation of the subdural hematoma at the site of evacuation; much of the space overlying the right cerebral hemisphere now contains acute blood, measuring up to 1.1 cm in thickness.  There is mild residual pneumocephalus.  The space overlying the right cerebral hemisphere has decreased, due to some degree of interval re-expansion, but there  has also been reaccumulation of scattered intraparenchymal blood within the right frontal lobe, with associated vasogenic edema.  There is slightly increased leftward midline shift, measuring 1.2 cm.  Trace intraventricular blood is again noted at the occipital  horn of the left lateral ventricle.  There is mildly worsened effacement of the right lateral ventricle, reflecting increased right-sided vasogenic edema.  Suggested trace blood along the tentorium cerebelli is less apparent on the current study.  There is no evidence of fracture; the patient's right frontoparietal craniotomy flap is unchanged in appearance.  The visualized portions of the orbits are within normal limits.  The paranasal sinuses and mastoid air cells are well-aerated. Prominent soft tissue swelling is noted surrounding the right orbit and overlying the craniotomy flap, with scattered soft tissue air noted along the craniotomy flap.  IMPRESSION:  1.  Interval reaccumulation of subdural hematoma at the site of evacuation on the right side, measuring up to 1.1 cm in thickness. Mild residual pneumocephalus noted. 2.  Interval reaccumulation of scattered intraparenchymal blood within the right frontal lobe; mildly increased vasogenic edema involving the right cerebral hemisphere, with mildly worsened effacement of the right lateral ventricle and slightly increased leftward midline shift, measuring 1.2 cm. 3.  Stable trace intraventricular blood at the occipital horn of the left lateral ventricle; trace blood along the tentorium cerebelli is less apparent on the current study. 4.  Prominent soft tissue swelling surrounding the right orbit and overlying the craniotomy flap, with scattered soft tissue air along the craniotomy flap.  Findings were discussed with [facility] RN on [id number] at 04:14 a.m. on 06/24/2011.  Original Report Authenticated By: Tonia Ghent, M.D.   Ct Portable Head W/o Cm  06/23/2011  *RADIOLOGY REPORT*  Clinical Data: Intracerebral hemorrhage.  Resection of a meningioma.  CT HEAD WITHOUT CONTRAST  Technique:  Contiguous axial images were obtained from the base of the skull through the vertex without contrast.  Comparison: 06/22/2011  Findings: The majority of the intracerebral right  frontal hematoma has been evacuated since the prior study.  There are several small foci of intracerebral hemorrhage which remain.  The midline shift has slightly improved since the prior study. There is a small amount of new subdural hemorrhage anterior to the left frontal lobe as well as adjacent to the right frontal lobe, slightly increased.  There is a tiny amount of hemorrhage in the occipital horn of the left lateral ventricle as well as in the base of the third ventricle.  There may be a small amount of hemorrhage along the tentorium.  No ventricular dilatation.  IMPRESSION:  1.  Interval evacuation of most of the right frontal parenchymal hemorrhage. 2.  Slight decrease in the midline shift. 3.  Slight increase in the small amount of subdural hemorrhage over the right frontal lobe with a new small area of air and subdural blood over the anterior aspect of the left frontal lobe. 4.  Tiny amount of blood in the third and left lateral ventricles. 5.  Probable small amount of region on the tentorium.  Original Report Authenticated By: Gwynn Burly, M.D.    Assessment/Plan: Head CT shows persitent shift, small SDH, some reaccumulation of ICH.  Plan continued sedation, deacadron at 6mg  Q 6 Hours, ventilator.  Close observation, platelet transfusion.  Repeat head CT in am.  LOS: 2 days    Jacqueline Christian D 06/24/2011, 6:40 AM

## 2011-06-24 NOTE — Progress Notes (Signed)
Subjective: Patient reports (Intubated, sedated)  Objective: Vital signs in last 24 hours: Temp:  [98.1 F (36.7 C)-100.1 F (37.8 C)] 98.1 F (36.7 C) (01/17 1607) Pulse Rate:  [39-76] 42  (01/17 1700) Resp:  [14-20] 15  (01/17 1700) BP: (111-141)/(47-93) 125/60 mmHg (01/17 1700) SpO2:  [98 %-100 %] 99 % (01/17 1700) Arterial Line BP: (136-144)/(62-82) 136/82 mmHg (01/16 2200) FiO2 (%):  [39.8 %-98.8 %] 39.9 % (01/17 1700)  Intake/Output from previous day: 01/16 0701 - 01/17 0700 In: 2292.9 [I.V.:1895.4; Blood:387.5; IV Piggyback:10] Out: 925 [Urine:925] Intake/Output this shift: Total I/O In: 762.5 [I.V.:250; Blood:300; NG/GT:60; IV Piggyback:152.5] Out: 830 [Urine:830]  Without change from this am. PEARL. Flicker responses left side.  Some right side movement in response to pain. Does not follow commands. HR ranging from ~39-50; BP stable ~120's systolic.  Lab Results:  Basename 06/24/11 0355 06/23/11 0430  WBC 16.8* 13.8*  HGB 10.1* 11.0*  HCT 29.9* 31.8*  PLT 108* 109*   BMET  Basename 06/24/11 0355 06/23/11 0430  NA 143 144  K 3.3* 3.6  CL 111 112  CO2 23 21  GLUCOSE 137* 166*  BUN 22 18  CREATININE 0.60 0.71  CALCIUM 8.1* 7.8*    Studies/Results: Ct Head Wo Contrast  06/22/2011  *RADIOLOGY REPORT*  Clinical Data: Altered mental status after resection of meningioma  CT HEAD WITHOUT CONTRAST  Technique:  Contiguous axial images were obtained from the base of the skull through the vertex without contrast.  Comparison: 05/28/2011  Findings: Evidence of right frontal parietal craniotomy noted with subcutaneous and subdural gas.  There is an area of right frontal edema and acute intraparenchymal hemorrhage measuring 4.7 x 3.3 cm. There is leftward midline shift of 4 mm.  Right hemispheric cerebral edema is noted with effacement of sulci.  There is also small amount of subdural acute hemorrhage.  Trace dependent left lateral ventricular acute hemorrhage is noted.   Orbits are unremarkable.  IMPRESSION: Right frontal postoperative change with intraparenchymal and subdural acute hemorrhage as above, with some mass effect upon the right cerebral hemisphere and 4 mm leftward midline shift.  Critical Value/emergent results were called by telephone at the time of interpretation on 06/22/2011  at 8:43 p.m.  to  Dr. Venetia Maxon, who verbally acknowledged these results.  Original Report Authenticated By: Harrel Lemon, M.D.   Dg Chest Port 1 View  06/24/2011  *RADIOLOGY REPORT*  Clinical Data: Line placement  PORTABLE CHEST - 1 VIEW  Comparison: Earlier today at 7:15  Findings: There is a new right IJ central line with the tip at the cavoatrial junction.  No pneumothorax.  The ET tube tip remains well above the carina.  The NG tube tip courses off the inferior film.  Cardiomegaly and pulmonary edema do not appear changed.  IMPRESSION: Central line tip at cavoatrial junction.  No pneumothorax.  Original Report Authenticated By: Brandon Melnick, M.D.   Dg Chest Port 1 View  06/24/2011  *RADIOLOGY REPORT*  Clinical Data: Respiratory failure, endotracheal tube assessment.  PORTABLE CHEST - 1 VIEW  Comparison: 06/23/2011  Findings: The endotracheal tube tip is 2.5 cm above the carina. Nasogastric tube is in place.  Low lung volumes noted with perihilar and basilar opacities favoring atelectasis and potentially underlying pulmonary venous hypertension and interstitial accentuation.  Atypical pneumonia cannot be completely excluded.  IMPRESSION:  1.  The tubes appears satisfactorily positioned. 2.  Persistent low lung volumes with perihilar and basilar patchy opacities likely predominately from atelectasis.  A component of mild interstitial edema or atypical pneumonia cannot be excluded.  Original Report Authenticated By: Dellia Cloud, M.D.   Dg Chest Port 1 View  06/23/2011  *RADIOLOGY REPORT*  Clinical Data: Intubation  PORTABLE CHEST - 1 VIEW  Comparison: 07/21/2005   Findings: Endotracheal tube 2.4 cm above the carina.  NG tube inserted entering the stomach with the tip not visualized.  Central vascular congestion with basilar atelectasis and likely posterior layering effusions.  No pneumothorax.  Dense left lower lobe retrocardiac consolidation/collapse.  IMPRESSION: Endotracheal tube 2.4 cm above the carina.  Vascular congestion, basilar atelectasis and effusions.  Dense left lower lobe retrocardiac consolidation/collapse.  Original Report Authenticated By: Judie Petit. Ruel Favors, M.D.   Ct Portable Head W/o Cm  06/24/2011  *RADIOLOGY REPORT*  Clinical Data: Follow up intracranial hematoma.  CT HEAD WITHOUT CONTRAST  Technique:  Contiguous axial images were obtained from the base of the skull through the vertex without contrast.  Comparison: CT of the head performed 06/23/2011  Findings:   There has been interval reaccumulation of the subdural hematoma at the site of evacuation; much of the space overlying the right cerebral hemisphere now contains acute blood, measuring up to 1.1 cm in thickness.  There is mild residual pneumocephalus.  The space overlying the right cerebral hemisphere has decreased, due to some degree of interval re-expansion, but there has also been reaccumulation of scattered intraparenchymal blood within the right frontal lobe, with associated vasogenic edema.  There is slightly increased leftward midline shift, measuring 1.2 cm.  Trace intraventricular blood is again noted at the occipital horn of the left lateral ventricle.  There is mildly worsened effacement of the right lateral ventricle, reflecting increased right-sided vasogenic edema.  Suggested trace blood along the tentorium cerebelli is less apparent on the current study.  There is no evidence of fracture; the patient's right frontoparietal craniotomy flap is unchanged in appearance.  The visualized portions of the orbits are within normal limits.  The paranasal sinuses and mastoid air cells are  well-aerated. Prominent soft tissue swelling is noted surrounding the right orbit and overlying the craniotomy flap, with scattered soft tissue air noted along the craniotomy flap.  IMPRESSION:  1.  Interval reaccumulation of subdural hematoma at the site of evacuation on the right side, measuring up to 1.1 cm in thickness. Mild residual pneumocephalus noted. 2.  Interval reaccumulation of scattered intraparenchymal blood within the right frontal lobe; mildly increased vasogenic edema involving the right cerebral hemisphere, with mildly worsened effacement of the right lateral ventricle and slightly increased leftward midline shift, measuring 1.2 cm. 3.  Stable trace intraventricular blood at the occipital horn of the left lateral ventricle; trace blood along the tentorium cerebelli is less apparent on the current study. 4.  Prominent soft tissue swelling surrounding the right orbit and overlying the craniotomy flap, with scattered soft tissue air along the craniotomy flap.  Findings were discussed with [facility] RN on [id number] at 04:14 a.m. on 06/24/2011.  Original Report Authenticated By: Tonia Ghent, M.D.   Ct Portable Head W/o Cm  06/23/2011  *RADIOLOGY REPORT*  Clinical Data: Intracerebral hemorrhage.  Resection of a meningioma.  CT HEAD WITHOUT CONTRAST  Technique:  Contiguous axial images were obtained from the base of the skull through the vertex without contrast.  Comparison: 06/22/2011  Findings: The majority of the intracerebral right frontal hematoma has been evacuated since the prior study.  There are several small foci of intracerebral hemorrhage which remain.  The midline shift  has slightly improved since the prior study. There is a small amount of new subdural hemorrhage anterior to the left frontal lobe as well as adjacent to the right frontal lobe, slightly increased.  There is a tiny amount of hemorrhage in the occipital horn of the left lateral ventricle as well as in the base of the third  ventricle.  There may be a small amount of hemorrhage along the tentorium.  No ventricular dilatation.  IMPRESSION:  1.  Interval evacuation of most of the right frontal parenchymal hemorrhage. 2.  Slight decrease in the midline shift. 3.  Slight increase in the small amount of subdural hemorrhage over the right frontal lobe with a new small area of air and subdural blood over the anterior aspect of the left frontal lobe. 4.  Tiny amount of blood in the third and left lateral ventricles. 5.  Probable small amount of region on the tentorium.  Original Report Authenticated By: Gwynn Burly, M.D.    Assessment/Plan:   LOS: 2 days  Continue supportive care.   Georgiann Cocker 06/24/2011, 5:18 PM

## 2011-06-24 NOTE — Clinical Documentation Improvement (Signed)
GENERIC DOCUMENTATION CLARIFICATION QUERY  THIS DOCUMENT IS NOT A PERMANENT PART OF THE MEDICAL RECORD  TO RESPOND TO THE THIS QUERY, FOLLOW THE INSTRUCTIONS BELOW:  1. If needed, update documentation for the patient's encounter via the notes activity.  2. Access this query again and click edit on the In [employer].  3. After updating, or not, click F2 to complete all highlighted (required) fields concerning your review. Select "additional documentation in the medical record" OR "no additional documentation provided".  4. Click Sign note button.  5. The deficiency will fall out of your In Basket *Please let us know if you are not able to complete this workflow by phone or e-mail (listed below).  Please update your documentation within the medical record to reflect your response to this query.                                                                                        06/24/11   Dear Dr.Shaniyah Wix / Associates,  In a better effort to capture your patient's severity of illness, reflect appropriate length of stay and utilization of resources, a review of the patient medical record has revealed the following indicators.   Based on your clinical judgment, please clarify and document in a progress note and/or discharge summary the clinical condition associated with the following supporting information: In responding to this query please exercise your independent judgment.  The fact that a query is asked, does not imply that any particular answer is desired or expected.  Please consider the below (if your clinical findings/judgment agree) as you document the patient's diagnosis/condition(s) in the progress note and discharge summary. Thank you!  "Interval reaccumulation of scattered intraparenchymal blood within the right frontal lobe; mildly increased vasogenic edema involving the right cerebral hemisphere, with mildly worsened effacement of the right lateral ventricle and slightly  increased leftward midline shift, measuring 1.2 cm." was reported in the 1/17 CT Scan.   BEST PRACTICE: Diagnoses can only be taken from a direct care provider/physician. Diagnoses can NOT be taken from a diagnostic/lab report. Please confirm any diagnosis in your progress note and discharge summary.  Possible Clinical Conditions?  - Vasogenic Edema  - Cerebral Edema  - Other condition (please document in the progress notes and/or discharge summary)  - Cannot Clinically determine at this time   Supporting Information:  - "Head CT shows persitent shift, small SDH, some reaccumulation of ICH. Plan continued sedation, deacadron at 6mg  Q 6 Hours, ventilator. Close observation, platelet transfusion. Repeat head CT in am" progress note 1/17   **You may use possible, probable, or suspect with inpatient documentation. possible, probable, suspected diagnoses MUST be documented at the time of discharge   Reviewed: additional documentation in the medical record   Thank You,  Saul Fordyce  Clinical Documentation Specialist: [phone] Pager  Please feel free to page me with any question regarding the query, including how to respond to the query.  Health Information Management [place]

## 2011-06-24 NOTE — Progress Notes (Signed)
OT Contact Note  OT order received and appreciated. Pt with bedrest order until discontinued.  OT evaluation pending until increased activity order.  Will complete evaluation when appropriate. Thank you.  06/24/2011 Cipriano Mile OTR/L Pager [phone] Office [phone]

## 2011-06-24 NOTE — Procedures (Signed)
Central Venous Catheter Insertion Procedure Note Jacqueline Christian [id number] 1945-08-25  Procedure: Insertion of Central Venous Catheter Indications: Assessment of intravascular volume and Drug and/or fluid administration  Procedure Details Consent: Risks of procedure as well as the alternatives and risks of each were explained to the (patient/caregiver).  Consent for procedure obtained. Time Out: Verified patient identification, verified procedure, site/side was marked, verified correct patient position, special equipment/implants available, medications/allergies/relevent history reviewed, required imaging and test results available.  Performed  Maximum sterile technique was used including antiseptics, cap, gloves, gown, [place] hygiene, mask and sheet. Skin prep: Chlorhexidine; local anesthetic administered A antimicrobial bonded/coated triple lumen catheter was placed in the right internal jugular vein using the Seldinger technique. Ultrasound guidance used.yes Catheter placed to 17 cm. Blood aspirated via all 3 ports and then flushed x 3. Line sutured x 2 and dressing applied.  Evaluation Blood flow good Complications: No apparent complications Patient did tolerate procedure well. Chest X-ray ordered to verify placement.  CXR: pending.  Performed by S. Minor ACNP

## 2011-06-24 NOTE — Progress Notes (Signed)
INITIAL ADULT NUTRITION ASSESSMENT Date: 06/24/2011   Time: 2:22 PM Reason for Assessment: TF Consult  ASSESSMENT: Female 66 y.o.  Dx: Right sphenoid wing meningioma  Hx:  Past Medical History  Diagnosis Date  . Recurrent sinus infections     just finished erythromycin dosing 06/18/11  . Hypertension   . Bladder spasms     takes oxybutinin  . Headache   . Dizziness   . Vision changes     r/t brain tumor  . Arthritis   . Depression     situational  . Neoplasm of brain     Related Meds:     . antiseptic oral rinse  15 mL Mouth Rinse QID  . atenolol  25 mg Oral Daily  . chlorhexidine  15 mL Mouth Rinse BID  . dexamethasone  10 mg Intravenous Once  . dexamethasone  4 mg Intravenous Q6H   Followed by  . dexamethasone  4 mg Intravenous Q8H  . dexamethasone  6 mg Intravenous Q6H  . insulin aspart  0-3 Units Subcutaneous Q4H  . levetiracetam  500 mg Intravenous Q12H  . loratadine  10 mg Oral Daily  . mannitol      . oxybutynin  5 mg Oral BID  . pantoprazole (PROTONIX) IV  40 mg Intravenous QHS  . potassium phosphate IVPB (mmol)  30 mmol Intravenous Once  . senna-docusate  1 tablet Oral BID     Ht: 5\' 3"  (160 cm)  Wt: 190 lb 7.6 oz (86.4 kg)  Ideal Wt: 52.3 kg % Ideal Wt: 165.2%  Usual Wt: unable to attain % Usual Wt:   Body mass index is 33.74 kg/(m^2). Class I Obesity  Food/Nutrition Related Hx: unable to obtain, no family present  Labs:  CMP     Component Value Date/Time   NA 143 06/24/2011 0355   K 3.3* 06/24/2011 0355   CL 111 06/24/2011 0355   CO2 23 06/24/2011 0355   GLUCOSE 137* 06/24/2011 0355   BUN 22 06/24/2011 0355   CREATININE 0.60 06/24/2011 0355   CALCIUM 8.1* 06/24/2011 0355   PROT 5.2* 06/23/2011 0430   ALBUMIN 3.0* 06/23/2011 0430   AST 31 06/23/2011 0430   ALT 54* 06/23/2011 0430   ALKPHOS 38* 06/23/2011 0430   BILITOT 0.9 06/23/2011 0430   GFRNONAA >90 06/24/2011 0355   GFRAA >90 06/24/2011 0355    CBG (last 3)   Basename 06/24/11 0813  06/24/11 0509 06/24/11 0049  GLUCAP 149* 123* 124*      Intake:I/O last 3 completed shifts: In: 4084.6 [I.V.:3332.1; Blood:387.5; Other:250; IV Piggyback:115] Out: 2980 [Urine:2730; Blood:250] Total I/O In: 360 [Blood:300; NG/GT:60] Out: 220 [Urine:220]    Diet Order: NPO  Supplements/Tube Feeding: N/A  IVF:    dextrose   propofol Last Rate: 40 mcg/kg/min (06/24/11 1305)  DISCONTD: 0.9 % NaCl with KCl 20 mEq / L Last Rate: 75 mL/hr at 06/23/11 1708  DISCONTD: mannitol 25 grams in dextrose 5%    Pt was admitted with dizziness and visual disturbance. MRI of brain revealed meningioma. Microdissection/debulking of hemangioma occurred on 06/22/11. Pt then had SDH and emergent hematoma decompression. Due to procedure, pt has incisions on right side of head.   Estimated Nutritional Needs:   ZOXW:[id number] Protein: >/= 105 Fluid: > 1.5 L   Per ASPEN guidelines for permissively underfeeding the obesity where BMI is > 30, the goal of EN should not exceed 60-70% of energy requirements (22-25 kcal/kg IBW)   Propofol is being given at 20.7  ml/hr which provides 546.5 kcal/day. RN reported this rate is being kept consistent.  NUTRITION DIAGNOSIS: -Inadequate oral intake (NI-2.1).  Status: Ongoing  RELATED TO: inability to eat  AS EVIDENCE BY: NPO status  MONITORING/EVALUATION(Goals): Goal: Meets 60-70% of estimated energy requirements Monitor: TF tolerance, weights, propofol rate, labs   EDUCATION NEEDS: -No education needs identified at this time  INTERVENTION:  Initiate Promote at 7ml/hr to provide 480 kcal, 30 gram protein, and 399 ml H20 via OGT  Add three 30 ml Pro-Stat to provide 216 kcal and 45 gram protein  Propofol at 20.7 ml/hr will provide 546 kcal   Overall will provide 1242.4 kcal (23.8 kcal/IBW), 75 gram protein (71.4% of estimated protein needs), and 399 ml H20.  Dietitian #[phone] DOCUMENTATION CODES Per approved criteria  -Morbid Obesity    Lloyd Huger 06/24/2011, 2:22 PM  Kendell Bane Cornelison [phone]

## 2011-06-25 ENCOUNTER — Inpatient Hospital Stay (HOSPITAL_COMMUNITY): Payer: Medicare Other

## 2011-06-25 LAB — CBC
HCT: 30.6 % — ABNORMAL LOW (ref 36.0–46.0)
Hemoglobin: 10.5 g/dL — ABNORMAL LOW (ref 12.0–15.0)
MCV: 89 fL (ref 78.0–100.0)
RBC: 3.44 MIL/uL — ABNORMAL LOW (ref 3.87–5.11)
WBC: 16.1 10*3/uL — ABNORMAL HIGH (ref 4.0–10.5)

## 2011-06-25 LAB — BLOOD GAS, ARTERIAL
Acid-Base Excess: 1.9 mmol/L (ref 0.0–2.0)
Acid-Base Excess: 2.5 mmol/L — ABNORMAL HIGH (ref 0.0–2.0)
Bicarbonate: 25 mEq/L — ABNORMAL HIGH (ref 20.0–24.0)
Drawn by: 320991
FIO2: 30 %
FIO2: 0.3 %
MECHVT: 500 mL
O2 Saturation: 93.8 %
RATE: 12 resp/min
TCO2: 26 mmol/L (ref 0–100)
pCO2 arterial: 32.5 mmHg — ABNORMAL LOW (ref 35.0–45.0)
pO2, Arterial: 62 mmHg — ABNORMAL LOW (ref 80.0–100.0)

## 2011-06-25 LAB — POCT I-STAT 4, (NA,K, GLUC, HGB,HCT)
Glucose, Bld: 135 mg/dL — ABNORMAL HIGH (ref 70–99)
Glucose, Bld: 151 mg/dL — ABNORMAL HIGH (ref 70–99)
HCT: 29 % — ABNORMAL LOW (ref 36.0–46.0)
Hemoglobin: 6.8 g/dL — CL (ref 12.0–15.0)
Hemoglobin: 9.9 g/dL — ABNORMAL LOW (ref 12.0–15.0)
Potassium: 3.9 mEq/L (ref 3.5–5.1)
Potassium: 4.1 mEq/L (ref 3.5–5.1)
Sodium: 141 mEq/L (ref 135–145)
Sodium: 143 mEq/L (ref 135–145)
Sodium: 146 mEq/L — ABNORMAL HIGH (ref 135–145)

## 2011-06-25 LAB — BASIC METABOLIC PANEL
Calcium: 8.5 mg/dL (ref 8.4–10.5)
GFR calc Af Amer: >90 mL/min (ref >90)
GFR calc non Af Amer: 89 mL/min — ABNORMAL LOW (ref >90)
Sodium: 144 mEq/L (ref 135–145)

## 2011-06-25 LAB — POCT I-STAT 7, (LYTES, BLD GAS, ICA,H+H)
Acid-base deficit: 3 mmol/L — ABNORMAL HIGH (ref 0.0–2.0)
Calcium, Ion: 1.03 mmol/L — ABNORMAL LOW (ref 1.12–1.32)
O2 Saturation: 100 %
Potassium: 3.6 mEq/L (ref 3.5–5.1)

## 2011-06-25 LAB — MAGNESIUM: Magnesium: 2.3 mg/dL (ref 1.5–2.5)

## 2011-06-25 LAB — GLUCOSE, CAPILLARY
Glucose-Capillary: 119 mg/dL — ABNORMAL HIGH (ref 70–99)
Glucose-Capillary: 101 mg/dL — ABNORMAL HIGH (ref 70–99)

## 2011-06-25 LAB — PROTIME-INR: INR: 1.17 (ref 0.00–1.49)

## 2011-06-25 MED ORDER — POTASSIUM PHOSPHATE DIBASIC 3 MMOLE/ML IV SOLN
30.0000 mmol | Freq: Once | INTRAVENOUS | Status: AC
  Administered 2011-06-25: 30 mmol via INTRAVENOUS
  Filled 2011-06-25: qty 10

## 2011-06-25 NOTE — Progress Notes (Signed)
Nutrition Follow-up  Diet Order:  NPO  Meds: Scheduled Meds:   . antiseptic oral rinse  15 mL Mouth Rinse QID  . atenolol  25 mg Oral Daily  . chlorhexidine  15 mL Mouth Rinse BID  . dexamethasone  6 mg Intravenous Q6H  . feeding supplement  30 mL Per Tube TID  . feeding supplement (PROMOTE)  20 mL/hr Per Tube Q1400  . insulin aspart  0-3 Units Subcutaneous Q4H  . levetiracetam  500 mg Intravenous Q12H  . loratadine  10 mg Oral Daily  . oxybutynin  5 mg Oral BID  . pantoprazole (PROTONIX) IV  40 mg Intravenous QHS  . potassium phosphate IVPB (mmol)  30 mmol Intravenous Once  . potassium phosphate IVPB (mmol)  30 mmol Intravenous Once  . senna-docusate  1 tablet Oral BID  . DISCONTD: dexamethasone  4 mg Intravenous Q8H   Continuous Infusions:   . dextrose    . propofol 34.915 mcg/kg/min (06/25/11 1100)   PRN Meds:.acetaminophen, acetaminophen, acetaminophen-codeine, HYDROcodone-acetaminophen, HYDROmorphone (DILAUDID) injection, HYDROmorphone, labetalol, ondansetron (ZOFRAN) IV, ondansetron  Labs:  CMP     Component Value Date/Time   NA 144 06/25/2011 0414   K 3.4* 06/25/2011 0414   CL 108 06/25/2011 0414   CO2 26 06/25/2011 0414   GLUCOSE 160* 06/25/2011 0414   BUN 26* 06/25/2011 0414   CREATININE 0.68 06/25/2011 0414   CALCIUM 8.5 06/25/2011 0414   PROT 5.2* 06/23/2011 0430   ALBUMIN 3.0* 06/23/2011 0430   AST 31 06/23/2011 0430   ALT 54* 06/23/2011 0430   ALKPHOS 38* 06/23/2011 0430   BILITOT 0.9 06/23/2011 0430   GFRNONAA 89* 06/25/2011 0414   GFRAA >90 06/25/2011 0414     Intake/Output Summary (Last 24 hours) at 06/25/11 1216 Last data filed at 06/25/11 1100  Gross per 24 hour  Intake 1401.9 ml  Output   3441 ml  Net -2039.1 ml   Propofol continues at 20.7 ml/hr providing 546 kcals per day. Per ASPEN guidelines for permissively underfeeding the obesity where BMI is > 30, the goal of EN should not exceed 60-70% of energy requirements    Weight Status:  90.4  kg  Re-estimated needs:   Kcal:1575 Protein: >/= 105  Fluid: > 1.5 L  Nutrition Dx: Inadequate oral intake (NI-2.1). Status: Ongoing    Goal:  Meets 60-70% of estimated energy requirements; met.   Intervention:    Continue Promote at 20 ml/hr with 30 ml Prostat tid.  With propofol will provide 1242 kcals, 75 grams protein, 399 ml H2O.   Will adjust TF/prostat based on propofol rate.  Monitor:  Propofol rate, labs, weight   Kendell Bane Cornelison Pager #:  [phone]

## 2011-06-25 NOTE — Progress Notes (Addendum)
Subjective: Patient reports intubated, sedated  Objective: Vital signs in last 24 hours: Temp:  [98.1 F (36.7 C)-99.3 F (37.4 C)] 98.5 F (36.9 C) (01/18 0400) Pulse Rate:  [39-62] 51  (01/18 0500) Resp:  [14-20] 15  (01/18 0500) BP: (108-141)/(49-93) 118/54 mmHg (01/18 0400) SpO2:  [96 %-100 %] 98 % (01/18 0500) FiO2 (%):  [30 %-40.2 %] 30.1 % (01/18 0500)  Intake/Output from previous day: 01/17 0701 - 01/18 0700 In: 1527.9 [I.V.:705.4; Blood:300; NG/GT:370; IV Piggyback:152.5] Out: 3420 [Urine:3420] Intake/Output this shift: Total I/O In: 517 [I.V.:207; NG/GT:310] Out: 2310 [Urine:2310]  Physical Exam: Off sedation, opening eyes and moving all extremities purposefully with left hemiparesis.  Pupils equal round and reactive.  Lab Results:  [facility] 06/25/11 0414 06/24/11 0355  WBC 16.1* 16.8*  HGB 10.5* 10.1*  HCT 30.6* 29.9*  PLT 147* 108*   BMET  Basename 06/25/11 0414 06/24/11 0355  NA 144 143  K 3.4* 3.3*  CL 108 111  CO2 26 23  GLUCOSE 160* 137*  BUN 26* 22  CREATININE 0.68 0.60  CALCIUM 8.5 8.1*    Studies/Results: Dg Chest Port 1 View  06/24/2011  *RADIOLOGY REPORT*  Clinical Data: Line placement  PORTABLE CHEST - 1 VIEW  Comparison: Earlier today at 7:15  Findings: There is a new right IJ central line with the tip at the cavoatrial junction.  No pneumothorax.  The ET tube tip remains well above the carina.  The NG tube tip courses off the inferior film.  Cardiomegaly and pulmonary edema do not appear changed.  IMPRESSION: Central line tip at cavoatrial junction.  No pneumothorax.  Original Report Authenticated By: Brandon Melnick, M.D.   Dg Chest Port 1 View  06/24/2011  *RADIOLOGY REPORT*  Clinical Data: Respiratory failure, endotracheal tube assessment.  PORTABLE CHEST - 1 VIEW  Comparison: 06/23/2011  Findings: The endotracheal tube tip is 2.5 cm above the carina. Nasogastric tube is in place.  Low lung volumes noted with perihilar and basilar  opacities favoring atelectasis and potentially underlying pulmonary venous hypertension and interstitial accentuation.  Atypical pneumonia cannot be completely excluded.  IMPRESSION:  1.  The tubes appears satisfactorily positioned. 2.  Persistent low lung volumes with perihilar and basilar patchy opacities likely predominately from atelectasis.  A component of mild interstitial edema or atypical pneumonia cannot be excluded.  Original Report Authenticated By: Dellia Cloud, M.D.   Ct Portable Head W/o Cm  06/24/2011  *RADIOLOGY REPORT*  Clinical Data: Follow up intracranial hematoma.  CT HEAD WITHOUT CONTRAST  Technique:  Contiguous axial images were obtained from the base of the skull through the vertex without contrast.  Comparison: CT of the head performed 06/23/2011  Findings:   There has been interval reaccumulation of the subdural hematoma at the site of evacuation; much of the space overlying the right cerebral hemisphere now contains acute blood, measuring up to 1.1 cm in thickness.  There is mild residual pneumocephalus.  The space overlying the right cerebral hemisphere has decreased, due to some degree of interval re-expansion, but there has also been reaccumulation of scattered intraparenchymal blood within the right frontal lobe, with associated vasogenic edema.  There is slightly increased leftward midline shift, measuring 1.2 cm.  Trace intraventricular blood is again noted at the occipital horn of the left lateral ventricle.  There is mildly worsened effacement of the right lateral ventricle, reflecting increased right-sided vasogenic edema.  Suggested trace blood along the tentorium cerebelli is less apparent on the current study.  There is no evidence of fracture; the patient's right frontoparietal craniotomy flap is unchanged in appearance.  The visualized portions of the orbits are within normal limits.  The paranasal sinuses and mastoid air cells are well-aerated. Prominent soft  tissue swelling is noted surrounding the right orbit and overlying the craniotomy flap, with scattered soft tissue air noted along the craniotomy flap.  IMPRESSION:  1.  Interval reaccumulation of subdural hematoma at the site of evacuation on the right side, measuring up to 1.1 cm in thickness. Mild residual pneumocephalus noted. 2.  Interval reaccumulation of scattered intraparenchymal blood within the right frontal lobe; mildly increased vasogenic edema involving the right cerebral hemisphere, with mildly worsened effacement of the right lateral ventricle and slightly increased leftward midline shift, measuring 1.2 cm. 3.  Stable trace intraventricular blood at the occipital horn of the left lateral ventricle; trace blood along the tentorium cerebelli is less apparent on the current study. 4.  Prominent soft tissue swelling surrounding the right orbit and overlying the craniotomy flap, with scattered soft tissue air along the craniotomy flap.  Findings were discussed with [facility] RN on [id number] at 04:14 a.m. on 06/24/2011.  Original Report Authenticated By: Tonia Ghent, M.D.    Assessment/Plan: Improving clinically and improving CT. Continue intubation and sedation at least through today, with goal of weaning and extubation as edema continues to improve.    LOS: 3 days    Seyon Strader D, MD 06/25/2011, 6:19 AM  Vasogenic Edema and Cerebral Edema are demonstrated on Head CT and are a significant part of the basis for prolonged intubation, sedation and mechanical ventilation in this patient.

## 2011-06-25 NOTE — Progress Notes (Signed)
Subjective: Patient reports (intubated, sedated)  Objective: Vital signs in last 24 hours: Temp:  [98.1 F (36.7 C)-99.3 F (37.4 C)] 98.5 F (36.9 C) (01/18 0400) Pulse Rate:  [39-62] 56  (01/18 0700) Resp:  [14-20] 15  (01/18 0700) BP: (103-141)/(45-66) 116/50 mmHg (01/18 0700) SpO2:  [96 %-100 %] 98 % (01/18 0700) FiO2 (%):  [30 %-40.2 %] 30.1 % (01/18 0700) Weight:  [90.4 kg (199 lb 4.7 oz)] 90.4 kg (199 lb 4.7 oz) (01/18 0600)  Intake/Output from previous day: 01/17 0701 - 01/18 0700 In: 1609.3 [I.V.:746.8; Blood:300; NG/GT:410; IV Piggyback:152.5] Out: 3580 [Urine:3580] Intake/Output this shift:    Sedated at present. No reported changes, (withdraws to pain, R>L). Pupils 2mm bilat & reactive.   Lab Results:  [facility] 06/25/11 0414 06/24/11 0355  WBC 16.1* 16.8*  HGB 10.5* 10.1*  HCT 30.6* 29.9*  PLT 147* 108*   BMET  Basename 06/25/11 0414 06/24/11 0355  NA 144 143  K 3.4* 3.3*  CL 108 111  CO2 26 23  GLUCOSE 160* 137*  BUN 26* 22  CREATININE 0.68 0.60  CALCIUM 8.5 8.1*    Studies/Results: Ct Head Wo Contrast  06/25/2011  *RADIOLOGY REPORT*  Clinical Data: Followup intracranial hemorrhage.  CT HEAD WITHOUT CONTRAST  Technique:  Contiguous axial images were obtained from the base of the skull through the vertex without contrast.  Comparison: None.  Findings: Difference in head positioning between the present exam and most recent examination of 06/24/2011.  Post right frontal - temporal craniotomy for resection of the sphenoid wing meningioma.  Postoperative evacuation of right frontal hematoma.  The amount of blood and vasogenic edema within the right frontal lobe is relatively similar to minimally increased (series 2 image 11).  Shifting appearance of right-sided subdural hematoma now extending inferiorly.  Maximal thickness 10.4 mm versus prior 11.1 mm.  Mass effect upon the right lateral ventricle with midline shift to the left now 10.8 mm versus prior 12 mm.   Small amount of tentorial hemorrhage more apparent than on the prior exam.  Tiny amount of dependent interventricular blood.  Right orbital soft tissue swelling most notable laterally.  Small amount of pneumocephalus.  No CT evidence of large acute infarct.  Small acute infarct cannot be excluded by CT.  No definitive intracranial mass noted on this unenhanced exam.  IMPRESSION: Question minimal increased amount of blood right frontal lobe (series 2 image 11).  Shifting of right-sided subdural hematoma with minimal decrease in maximal thickness.  Persistent prominent midline shift to the left has decreased minimally.  Small tentorial subdural hematoma.  Tiny amount of dependent interventricular blood remains.  Preliminary report by Dr. Cherly Hensen discussed with nursing staff on 3100 06/25/2011 3:40 a.m.  Original Report Authenticated By: Fuller Canada, M.D.   Dg Chest Port 1 View  06/24/2011  *RADIOLOGY REPORT*  Clinical Data: Line placement  PORTABLE CHEST - 1 VIEW  Comparison: Earlier today at 7:15  Findings: There is a new right IJ central line with the tip at the cavoatrial junction.  No pneumothorax.  The ET tube tip remains well above the carina.  The NG tube tip courses off the inferior film.  Cardiomegaly and pulmonary edema do not appear changed.  IMPRESSION: Central line tip at cavoatrial junction.  No pneumothorax.  Original Report Authenticated By: Brandon Melnick, M.D.   Dg Chest Port 1 View  06/24/2011  *RADIOLOGY REPORT*  Clinical Data: Respiratory failure, endotracheal tube assessment.  PORTABLE CHEST - 1 VIEW  Comparison: 06/23/2011  Findings: The endotracheal tube tip is 2.5 cm above the carina. Nasogastric tube is in place.  Low lung volumes noted with perihilar and basilar opacities favoring atelectasis and potentially underlying pulmonary venous hypertension and interstitial accentuation.  Atypical pneumonia cannot be completely excluded.  IMPRESSION:  1.  The tubes appears satisfactorily  positioned. 2.  Persistent low lung volumes with perihilar and basilar patchy opacities likely predominately from atelectasis.  A component of mild interstitial edema or atypical pneumonia cannot be excluded.  Original Report Authenticated By: Dellia Cloud, M.D.   Ct Portable Head W/o Cm  06/24/2011  *RADIOLOGY REPORT*  Clinical Data: Follow up intracranial hematoma.  CT HEAD WITHOUT CONTRAST  Technique:  Contiguous axial images were obtained from the base of the skull through the vertex without contrast.  Comparison: CT of the head performed 06/23/2011  Findings:   There has been interval reaccumulation of the subdural hematoma at the site of evacuation; much of the space overlying the right cerebral hemisphere now contains acute blood, measuring up to 1.1 cm in thickness.  There is mild residual pneumocephalus.  The space overlying the right cerebral hemisphere has decreased, due to some degree of interval re-expansion, but there has also been reaccumulation of scattered intraparenchymal blood within the right frontal lobe, with associated vasogenic edema.  There is slightly increased leftward midline shift, measuring 1.2 cm.  Trace intraventricular blood is again noted at the occipital horn of the left lateral ventricle.  There is mildly worsened effacement of the right lateral ventricle, reflecting increased right-sided vasogenic edema.  Suggested trace blood along the tentorium cerebelli is less apparent on the current study.  There is no evidence of fracture; the patient's right frontoparietal craniotomy flap is unchanged in appearance.  The visualized portions of the orbits are within normal limits.  The paranasal sinuses and mastoid air cells are well-aerated. Prominent soft tissue swelling is noted surrounding the right orbit and overlying the craniotomy flap, with scattered soft tissue air noted along the craniotomy flap.  IMPRESSION:  1.  Interval reaccumulation of subdural hematoma at the site  of evacuation on the right side, measuring up to 1.1 cm in thickness. Mild residual pneumocephalus noted. 2.  Interval reaccumulation of scattered intraparenchymal blood within the right frontal lobe; mildly increased vasogenic edema involving the right cerebral hemisphere, with mildly worsened effacement of the right lateral ventricle and slightly increased leftward midline shift, measuring 1.2 cm. 3.  Stable trace intraventricular blood at the occipital horn of the left lateral ventricle; trace blood along the tentorium cerebelli is less apparent on the current study. 4.  Prominent soft tissue swelling surrounding the right orbit and overlying the craniotomy flap, with scattered soft tissue air along the craniotomy flap.  Findings were discussed with [facility] RN on [id number]-[id number] at 04:14 a.m. on 06/24/2011.  Original Report Authenticated By: Tonia Ghent, M.D.    Assessment/Plan: CT reviewed by Dr. Venetia Maxon, much improved.   LOS: 3 days  Continue supportive care. Expect improvement.   Jacqueline Christian 06/25/2011, 7:53 AM

## 2011-06-25 NOTE — Progress Notes (Signed)
HPI:  66 y/o woman with a Hx of HTN admitted with dizziness and visual disturbance. 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 Subsequently Dr. Venetia Maxon (Neurosurgery) performed microdissection/debulking of hemangioma on 1/145/13 at 16:00 pm. At 21:00 the patient became unresponsive and a repeat imaging of her head demonstrated a postoperative subdural hematoma that required an emergent hematoma decompression. Patient tolerated second procedure well. PCCM was consulted for a continued ventilator management.  Antibiotics:   Cefazolin (post surgical prophylaxis only) 1/15/>>>  Cultures/Sepsis Markers:   None  Access/Protocols:  Left radial Aline 1/15>>  NG/OGT 1/15>>  ETT (7.5 mm) 1/15 >>  Best Practice: DVT: Coagulopathic, SCDs GI: Protonix  Subjective: Post-op, sedated and intubated.  Physical Exam: Filed Vitals:   06/25/11 0900  BP: 119/57  Pulse: 49  Temp:   Resp: 16    Intake/Output Summary (Last 24 hours) at 06/25/11 0956 Last data filed at 06/25/11 0900  Gross per 24 hour  Intake 1130.7 ml  Output   3513 ml  Net -2382.3 ml   Vent Mode:  [-] PRVC FiO2 (%):  [29.7 %-40.2 %] 29.7 % Set Rate:  [15 bmp] 15 bmp Vt Set:  [500 mL] 500 mL PEEP:  [5 cmH20] 5 cmH20 Plateau Pressure:  [18 cmH20-22 cmH20] 18 cmH20  General: Intubated, sedated and unresponsive. Neuro: Sedated on propofol  HEENT: Scalp dressing in place, pupils equal, round reactive to light, ETT in place  Cardiovascular: Sinus rhythm  Lungs: CTA B  Abdomen: BS+, soft, nontender, no hsm  Skin: no rashes  Labs: CBC    Component Value Date/Time   WBC 16.1* 06/25/2011 0414   RBC 3.44* 06/25/2011 0414   HGB 10.5* 06/25/2011 0414   HCT 30.6* 06/25/2011 0414   PLT 147* 06/25/2011 0414   MCV 89.0 06/25/2011 0414   MCH 30.5 06/25/2011 0414   MCHC 34.3 06/25/2011 0414   RDW  14.8 06/25/2011 0414   BMET    Component Value Date/Time   NA 144 06/25/2011 0414   K 3.4* 06/25/2011 0414   CL 108 06/25/2011 0414   CO2 26 06/25/2011 0414   GLUCOSE 160* 06/25/2011 0414   BUN 26* 06/25/2011 0414   CREATININE 0.68 06/25/2011 0414   CALCIUM 8.5 06/25/2011 0414   GFRNONAA 89* 06/25/2011 0414   GFRAA >90 06/25/2011 0414   ABG    Component Value Date/Time   PHART 7.490* 06/25/2011 0438   PCO2ART 33.1* 06/25/2011 0438   PO2ART 62.0* 06/25/2011 0438   HCO3 25.0* 06/25/2011 0438   TCO2 26.0 06/25/2011 0438   ACIDBASEDEF 2.2* 06/23/2011 0326   O2SAT 93.8 06/25/2011 0438    Lab 06/25/11 0414  MG 2.3   Lab Results  Component Value Date   CALCIUM 8.5 06/25/2011   PHOS 2.8 06/25/2011    Chest Xray: bibasilar atelectasis with effusion and pulmonary edema, ET tube ok.  Assessment & Plan: 66 y/o female s/p resection of a meningioma who developed AMS due to an acute subdural and intracranial hemorrhage. Now s/p craniotomy and hematoma evacuation.   NEUROLOGIC:  1. AMS due to continuous sedation with propofol and extension of the bleed/edema and shift. - Daily WUA. - RASS score -2. - Otherwise per neurosurgery.  2. 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  - Hemodynamically stable.  - NS primary service.  -  Continue Cefazolin. - Continue Decadron. - Continue Keprra. - Head CT 06/25/11 noted.  PULMONARY:  Post-operative ventilation management sp cranial surgery  - Decrease RR to 12 and f/u ABG. - ABG in am  - PCXR shows bilateral effusions and edema but clinically doing well on vent and doesn't appear volume up on exam, will not address edema and effusion at this time. - Hold diureses until patient is stable from a neurologic edema to not affect fluid shift at this time, will keep current fluid management strategy in place. - KVO IVF as hyperchloremia is starting to affect acid/base.  CARDIOVASCULAR:  Bradycardia but normotensive, if becomes hypotensive will add dopamine.  Insert TLC today. - Hx of HTN.  - Currently BP's are in lower normal range and adequately controlled.   RENAL:  - Normal renal function. - Replace K and Phos. - Monitor UO (Foley cath to gravity). - B-met in am.  GASTROINTESTINAL:  - Patient is with OGT and NPO  - Consult nutrition for TF. - Daily I/O's.  HEMATOLOGIC:  Anemia/thrombocytopenia, likely due to problems listed above.  -Follow H:H  -CBC And INR in am  -No heparin products  -SCD's for DVT prophylaxis   Lab  06/22/11 2210  06/18/11 1500   HGB  10.5*  12.5   HCT  30.4*  35.9*   WBC  11.5*  18.1*   PLT  109*  230     Lab  06/22/11 2210   INR  1.24     Lab  06/22/11 2210   APTT  22*    INFECTIOUS:  Patint is on empiric therapy with Cefazolin sp cranial surgery.  Afebrile  Will follow NS recs on duration  - Daily WBC's   Lab  06/22/11 2210  06/18/11 1500   WBC  11.5*  18.1*    ENDOCRINE:  No Hx of DM.  -on Decadron therapy  -on ICU Hyperglycemia protocol   The patient is critically ill with multiple organ systems failure and requires high complexity decision making for assessment and support, frequent evaluation and titration of therapies, application of advanced monitoring technologies and extensive interpretation of multiple databases. Critical Care Time devoted to patient care services described in this note is 35 minutes.  Koren Bound, MD [phone]

## 2011-06-26 ENCOUNTER — Inpatient Hospital Stay (HOSPITAL_COMMUNITY): Payer: Medicare Other

## 2011-06-26 DIAGNOSIS — R4020 Unspecified coma: Secondary | ICD-10-CM

## 2011-06-26 LAB — BASIC METABOLIC PANEL
CO2: 25 mEq/L (ref 19–32)
Calcium: 8.2 mg/dL — ABNORMAL LOW (ref 8.4–10.5)
Chloride: 108 mEq/L (ref 96–112)
Creatinine, Ser: 0.63 mg/dL (ref 0.50–1.10)
Glucose, Bld: 99 mg/dL (ref 70–99)
Sodium: 142 mEq/L (ref 135–145)

## 2011-06-26 LAB — BLOOD GAS, ARTERIAL
Drawn by: 24513
MECHVT: 500 mL
O2 Saturation: 97.4 %
PEEP: 5 cmH2O
RATE: 12 resp/min
pO2, Arterial: 79.6 mmHg — ABNORMAL LOW (ref 80.0–100.0)

## 2011-06-26 LAB — MAGNESIUM: Magnesium: 2.2 mg/dL (ref 1.5–2.5)

## 2011-06-26 LAB — CBC
Hemoglobin: 10.3 g/dL — ABNORMAL LOW (ref 12.0–15.0)
MCH: 30.1 pg (ref 26.0–34.0)
MCV: 89.2 fL (ref 78.0–100.0)
RBC: 3.42 MIL/uL — ABNORMAL LOW (ref 3.87–5.11)

## 2011-06-26 LAB — GLUCOSE, CAPILLARY
Glucose-Capillary: 111 mg/dL — ABNORMAL HIGH (ref 70–99)
Glucose-Capillary: 121 mg/dL — ABNORMAL HIGH (ref 70–99)

## 2011-06-26 MED ORDER — PANTOPRAZOLE SODIUM 40 MG PO PACK
40.0000 mg | PACK | Freq: Every day | ORAL | Status: DC
  Administered 2011-06-26 – 2011-06-27 (×2): 40 mg
  Filled 2011-06-26 (×3): qty 20

## 2011-06-26 NOTE — Progress Notes (Signed)
Subjective: Patient reports intubated, sedated  Objective: Vital signs in last 24 hours: Temp:  [99.5 F (37.5 C)-99.7 F (37.6 C)] 99.7 F (37.6 C) (01/19 0400) Pulse Rate:  [47-69] 57  (01/19 0700) Resp:  [13-32] 14  (01/19 0700) BP: (108-130)/(46-60) 108/50 mmHg (01/19 0700) SpO2:  [97 %-100 %] 98 % (01/19 0700) FiO2 (%):  [29.7 %-30.5 %] 30 % (01/19 0700) Weight:  [89.2 kg (196 lb 10.4 oz)] 89.2 kg (196 lb 10.4 oz) (01/19 0600)  Intake/Output from previous day: 01/18 0701 - 01/19 0700 In: 1804.6 [I.V.:489.6; NG/GT:460; IV Piggyback:615] Out: 1981 [Urine:1981] Intake/Output this shift:    Wound: intubated,sedated  Lab Results:  Basename 06/26/11 0400 06/25/11 0414  WBC 14.8* 16.1*  HGB 10.3* 10.5*  HCT 30.5* 30.6*  PLT 136* 147*   BMET  Basename 06/26/11 0400 06/25/11 0414  NA 142 144  K 3.3* 3.4*  CL 108 108  CO2 25 26  GLUCOSE 99 160*  BUN 31* 26*  CREATININE 0.63 0.68  CALCIUM 8.2* 8.5    Studies/Results: Ct Head Wo Contrast  06/26/2011  *RADIOLOGY REPORT*  Clinical Data: Follow-up intracranial hemorrhage.  CT HEAD WITHOUT CONTRAST  Technique:  Contiguous axial images were obtained from the base of the skull through the vertex without contrast.  Comparison: 06/25/2011.  Findings: Changes of prior right frontal craniotomy again noted, unchanged.  Blood within the right frontal lobe is stable as is the right subdural hematoma.  Midline shift is stable at 9 mm.  No hydrocephalus.  Small amount of blood within the left lateral ventricle, also stable.  Stable blood along the tentorium.  IMPRESSION: No interval change.  Original Report Authenticated By: Cyndie Chime, M.D.   Ct Head Wo Contrast  06/25/2011  *RADIOLOGY REPORT*  Clinical Data: Followup intracranial hemorrhage.  CT HEAD WITHOUT CONTRAST  Technique:  Contiguous axial images were obtained from the base of the skull through the vertex without contrast.  Comparison: None.  Findings: Difference in head  positioning between the present exam and most recent examination of 06/24/2011.  Post right frontal - temporal craniotomy for resection of the sphenoid wing meningioma.  Postoperative evacuation of right frontal hematoma.  The amount of blood and vasogenic edema within the right frontal lobe is relatively similar to minimally increased (series 2 image 11).  Shifting appearance of right-sided subdural hematoma now extending inferiorly.  Maximal thickness 10.4 mm versus prior 11.1 mm.  Mass effect upon the right lateral ventricle with midline shift to the left now 10.8 mm versus prior 12 mm.  Small amount of tentorial hemorrhage more apparent than on the prior exam.  Tiny amount of dependent interventricular blood.  Right orbital soft tissue swelling most notable laterally.  Small amount of pneumocephalus.  No CT evidence of large acute infarct.  Small acute infarct cannot be excluded by CT.  No definitive intracranial mass noted on this unenhanced exam.  IMPRESSION: Question minimal increased amount of blood right frontal lobe (series 2 image 11).  Shifting of right-sided subdural hematoma with minimal decrease in maximal thickness.  Persistent prominent midline shift to the left has decreased minimally.  Small tentorial subdural hematoma.  Tiny amount of dependent interventricular blood remains.  Preliminary report by Dr. Cherly Hensen discussed with nursing staff on 3100 06/25/2011 3:40 a.m.  Original Report Authenticated By: Fuller Canada, M.D.   Dg Chest Port 1 View  06/25/2011  *RADIOLOGY REPORT*  Clinical Data: Evaluate endotracheal tube placement.  PORTABLE CHEST - 1 VIEW  Comparison:  06/24/2011  Findings: Endotracheal tube is 4.0 cm above the carina.  Central venous catheter in the SVC region.  Nasogastric tube extends into the abdomen.  Hazy densities at the left lung base could represent volume loss and possible pleural fluid.  There are densities in the left upper lung concerning for an area of airspace  disease.  Heart size is stable.  IMPRESSION: Concern for developing airspace disease in the left upper lung. Recommend attention to this area on followup imaging.  Support apparatuses as described.  Original Report Authenticated By: Richarda Overlie, M.D.   Dg Chest Port 1 View  06/24/2011  *RADIOLOGY REPORT*  Clinical Data: Line placement  PORTABLE CHEST - 1 VIEW  Comparison: Earlier today at 7:15  Findings: There is a new right IJ central line with the tip at the cavoatrial junction.  No pneumothorax.  The ET tube tip remains well above the carina.  The NG tube tip courses off the inferior film.  Cardiomegaly and pulmonary edema do not appear changed.  IMPRESSION: Central line tip at cavoatrial junction.  No pneumothorax.  Original Report Authenticated By: Brandon Melnick, M.D.    Assessment/Plan: CT stable to slighly less shift -   OK for Pulm/CritCare to wean vent and even wean to extubate if she does well  LOS: 4 days     Rickeya Manus R, MD 06/26/2011, 7:37 AM

## 2011-06-26 NOTE — Progress Notes (Signed)
PCCM PROGRESS NOTE  HPI:  66 y/o woman with a Hx of HTN admitted with dizziness and visual disturbance. 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 Subsequently Dr. Venetia Maxon (Neurosurgery) performed microdissection/debulking of hemangioma on 1/145/13 at 16:00 pm. At 21:00 the patient became unresponsive and a repeat imaging of her head demonstrated a postoperative subdural hematoma that required an emergent hematoma decompression. Patient tolerated second procedure well. PCCM was consulted for a continued ventilator management.  Antibiotics:   Cefazolin (post surgical prophylaxis only) 1/15/>>>  Cultures/Sepsis Markers:   None  Access/Protocols:  Left radial Aline 1/15>>  NG/OGT 1/15>>  ETT (7.5 mm) 1/15 >>  Best Practice: DVT: Coagulopathic, SCDs GI: Protonix  Subjective: Post-op, sedated and intubated.  Physical Exam: Filed Vitals:   06/26/11 0855  BP: 121/51  Pulse:   Temp:   Resp:     Intake/Output Summary (Last 24 hours) at 06/26/11 0913 Last data filed at 06/26/11 0800  Gross per 24 hour  Intake 1703.2 ml  Output   2103 ml  Net -399.8 ml   Vent Mode:  [-] CPAP FiO2 (%):  [29.7 %-30.5 %] 30 % Set Rate:  [12 bmp] 12 bmp Vt Set:  [0 mL-500 mL] 0 mL PEEP:  [5 cmH20] 5 cmH20 Pressure Support:  [5 cmH20] 5 cmH20 Plateau Pressure:  [13 cmH20-17 cmH20] 17 cmH20  General: Intubated, sedated and unresponsive. Neuro: Sedated on propofol  HEENT: Scalp dressing in place, pupils equal, round reactive to light, ETT in place  Cardiovascular: Sinus rhythm  Lungs: CTA B  Abdomen: BS+, soft, nontender, no hsm  Skin: no rashes  Labs:  Lab 06/26/11 0400 06/25/11 0414 06/24/11 0355  NA 142 144 143  K 3.3* 3.4* 3.3*  CL 108 108 111  CO2 25 26 23   BUN 31* 26* 22    CREATININE 0.63 0.68 0.60  GLUCOSE 99 160* 137*    Lab 06/26/11 0400 06/25/11 0414 06/24/11 0355  HGB 10.3* 10.5* 10.1*  HCT 30.5* 30.6* 29.9*  WBC 14.8* 16.1* 16.8*  PLT 136* 147* 108*    ABG    Component Value Date/Time   PHART 7.500* 06/26/2011 0320   PCO2ART 32.7* 06/26/2011 0320   PO2ART 79.6* 06/26/2011 0320   HCO3 25.3* 06/26/2011 0320   TCO2 26.3 06/26/2011 0320   ACIDBASEDEF 2.2* 06/23/2011 0326   O2SAT 97.4 06/26/2011 0320    Lab 06/26/11 0400  MG 2.2   Lab Results  Component Value Date   CALCIUM 8.2* 06/26/2011   PHOS 3.3 06/26/2011    Ct Head Wo Contrast  06/26/2011  *RADIOLOGY REPORT*  Clinical Data: Follow-up intracranial hemorrhage.  CT HEAD WITHOUT CONTRAST  Technique:  Contiguous axial images were obtained from the base of the skull through the vertex without contrast.  Comparison: 06/25/2011.  Findings: Changes of prior right frontal craniotomy again noted, unchanged.  Blood within the right frontal lobe is stable as is the right subdural hematoma.  Midline shift is stable at 9 mm.  No hydrocephalus.  Small amount of blood within the left lateral ventricle, also stable.  Stable blood along the tentorium.  IMPRESSION: No interval change.  Original Report Authenticated By: Cyndie Chime, M.D.   Ct Head Wo Contrast  06/25/2011  *RADIOLOGY REPORT*  Clinical Data: Followup intracranial hemorrhage.  CT HEAD WITHOUT CONTRAST  Technique:  Contiguous axial images were obtained  from the base of the skull through the vertex without contrast.  Comparison: None.  Findings: Difference in head positioning between the present exam and most recent examination of 06/24/2011.  Post right frontal - temporal craniotomy for resection of the sphenoid wing meningioma.  Postoperative evacuation of right frontal hematoma.  The amount of blood and vasogenic edema within the right frontal lobe is relatively similar to minimally increased (series 2 image 11).  Shifting appearance of right-sided  subdural hematoma now extending inferiorly.  Maximal thickness 10.4 mm versus prior 11.1 mm.  Mass effect upon the right lateral ventricle with midline shift to the left now 10.8 mm versus prior 12 mm.  Small amount of tentorial hemorrhage more apparent than on the prior exam.  Tiny amount of dependent interventricular blood.  Right orbital soft tissue swelling most notable laterally.  Small amount of pneumocephalus.  No CT evidence of large acute infarct.  Small acute infarct cannot be excluded by CT.  No definitive intracranial mass noted on this unenhanced exam.  IMPRESSION: Question minimal increased amount of blood right frontal lobe (series 2 image 11).  Shifting of right-sided subdural hematoma with minimal decrease in maximal thickness.  Persistent prominent midline shift to the left has decreased minimally.  Small tentorial subdural hematoma.  Tiny amount of dependent interventricular blood remains.  Preliminary report by Dr. Cherly Hensen discussed with nursing staff on 3100 06/25/2011 3:40 a.m.  Original Report Authenticated By: Fuller Canada, M.D.   Dg Chest Port 1 View  06/25/2011  *RADIOLOGY REPORT*  Clinical Data: Evaluate endotracheal tube placement.  PORTABLE CHEST - 1 VIEW  Comparison: 06/24/2011  Findings: Endotracheal tube is 4.0 cm above the carina.  Central venous catheter in the SVC region.  Nasogastric tube extends into the abdomen.  Hazy densities at the left lung base could represent volume loss and possible pleural fluid.  There are densities in the left upper lung concerning for an area of airspace disease.  Heart size is stable.  IMPRESSION: Concern for developing airspace disease in the left upper lung. Recommend attention to this area on followup imaging.  Support apparatuses as described.  Original Report Authenticated By: Richarda Overlie, M.D.   Dg Chest Port 1 View  06/24/2011  *RADIOLOGY REPORT*  Clinical Data: Line placement  PORTABLE CHEST - 1 VIEW  Comparison: Earlier today at 7:15   Findings: There is a new right IJ central line with the tip at the cavoatrial junction.  No pneumothorax.  The ET tube tip remains well above the carina.  The NG tube tip courses off the inferior film.  Cardiomegaly and pulmonary edema do not appear changed.  IMPRESSION: Central line tip at cavoatrial junction.  No pneumothorax.  Original Report Authenticated By: Brandon Melnick, M.D.    Assessment & Plan: 66 y/o female s/p resection of a meningioma who developed AMS due to an acute subdural and intracranial hemorrhage. Now s/p craniotomy and hematoma evacuation.   NEUROLOGIC:  1. AMS due to continuous sedation with propofol and extension of the bleed/edema and shift. - Daily WUA. - RASS score -2. - Otherwise per neurosurgery.  2. 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  - Hemodynamically stable.  - NS primary service.  - Continue Cefazolin. - Continue Decadron. - Continue Keprra. - Head CT 06/25/11 noted.  PULMONARY:  Post-operative ventilation management sp cranial surgery  -wean as tolerated per NS. - Hold diureses until patient is stable from a  neurologic edema to not affect fluid shift at this time, will keep current fluid management strategy in place. - KVO IVF as hyperchloremia is starting to affect acid/base.  CARDIOVASCULAR: Bradycardia but normotensive, if becomes hypotensive will add dopamine.  Insert TLC today. - Hx of HTN.  - Currently BP's are in lower normal range and adequately controlled.   RENAL:  - Normal renal function. - Replace K and Phos. - Monitor UO (Foley cath to gravity).   GASTROINTESTINAL:  - Patient is with OGT and NPO  - Consult nutrition for TF. - Daily I/O's.  HEMATOLOGIC:  Anemia/thrombocytopenia, likely due to problems listed above.  -Follow H:H  -CBC And INR in am  -No heparin products  -SCD's for DVT prophylaxis   Lab  06/22/11 2210  06/18/11 1500   HGB  10.5*   12.5   HCT  30.4*  35.9*   WBC  11.5*  18.1*   PLT  109*  230     Lab  06/22/11 2210   INR  1.24     Lab  06/22/11 2210   APTT  22*    INFECTIOUS:  Patint is on empiric therapy with Cefazolin sp cranial surgery.  Afebrile  Will follow NS recs on duration  - Daily WBC's   Lab  06/22/11 2210  06/18/11 1500   WBC  11.5*  18.1*    ENDOCRINE:  No Hx of DM.  -on Decadron therapy  -on ICU Hyperglycemia protocol   Brett Canales Minor ACNP [facility] PCCM Pager [phone] till 3 pm If no answer page [phone] 06/26/2011, 9:13 AM   STAFF NOTE: I, Dr Lavinia Sharps have personally reviewed patient's available data, including medical history, events of note, physical examination and test results as part of my evaluation. I have discussed with resident/NP and other care providers such as pharmacist, RN and RRT.  In addition,  I personally evaluated patient and elicited key findings of acute resp failure in setting of s/p neurosurgery for meningioma and subsequent subdural hematoma. Currently despite being off diprivan and ct per neurosurgery shows stability v slight less shift she is comatose (RASS -4) several hours off diprivan. But doing SBT. Will not extubate 06/26/11. Will dc prn narcs and monitor.  Rest per NP whose note is outlined above and that I agree with  The patient is critically ill with multiple organ systems failure and requires high complexity decision making for assessment and support, frequent evaluation and titration of therapies, application of advanced monitoring technologies and extensive interpretation of multiple databases.   Critical Care Time devoted to patient care services described in this note is  35  Minutes.  Dr. Kalman Shan, M.D., [facility].C.P Pulmonary and Critical Care Medicine Staff Physician [facility] Pager: [phone], If no answer or between  15:00h - 7:00h: call [phone]  06/26/2011 11:49 AM

## 2011-06-27 ENCOUNTER — Inpatient Hospital Stay (HOSPITAL_COMMUNITY): Payer: Medicare Other

## 2011-06-27 DIAGNOSIS — I6200 Nontraumatic subdural hemorrhage, unspecified: Secondary | ICD-10-CM

## 2011-06-27 DIAGNOSIS — R4020 Unspecified coma: Secondary | ICD-10-CM

## 2011-06-27 DIAGNOSIS — J9600 Acute respiratory failure, unspecified whether with hypoxia or hypercapnia: Secondary | ICD-10-CM

## 2011-06-27 LAB — GLUCOSE, CAPILLARY
Glucose-Capillary: 137 mg/dL — ABNORMAL HIGH (ref 70–99)
Glucose-Capillary: 128 mg/dL — ABNORMAL HIGH (ref 70–99)
Glucose-Capillary: 118 mg/dL — ABNORMAL HIGH (ref 70–99)
Glucose-Capillary: 121 mg/dL — ABNORMAL HIGH (ref 70–99)

## 2011-06-27 LAB — BASIC METABOLIC PANEL
BUN: 26 mg/dL — ABNORMAL HIGH (ref 6–23)
Chloride: 105 mEq/L (ref 96–112)
Glucose, Bld: 123 mg/dL — ABNORMAL HIGH (ref 70–99)
Potassium: 3 mEq/L — ABNORMAL LOW (ref 3.5–5.1)

## 2011-06-27 LAB — CBC
HCT: 32.7 % — ABNORMAL LOW (ref 36.0–46.0)
Hemoglobin: 11 g/dL — ABNORMAL LOW (ref 12.0–15.0)
RBC: 3.65 MIL/uL — ABNORMAL LOW (ref 3.87–5.11)
WBC: 15.6 10*3/uL — ABNORMAL HIGH (ref 4.0–10.5)

## 2011-06-27 LAB — BLOOD GAS, ARTERIAL
Acid-Base Excess: 3.6 mmol/L — ABNORMAL HIGH (ref 0.0–2.0)
Drawn by: 23604
FIO2: 0.3 %
MECHVT: 500 mL
O2 Saturation: 98.5 %
RATE: 12 resp/min
TCO2: 27.5 mmol/L (ref 0–100)
pO2, Arterial: 101 mmHg — ABNORMAL HIGH (ref 80.0–100.0)

## 2011-06-27 MED ORDER — POTASSIUM CHLORIDE 20 MEQ/15ML (10%) PO LIQD
40.0000 meq | Freq: Once | ORAL | Status: AC
  Administered 2011-06-27: 40 meq
  Filled 2011-06-27: qty 30

## 2011-06-27 NOTE — Progress Notes (Signed)
Subjective: Patient intubated - off sedation  Objective: Vital signs in last 24 hours: Temp:  [99.4 F (37.4 C)-100.7 F (38.2 C)] 99.5 F (37.5 C) (01/20 0400) Pulse Rate:  [54-74] 55  (01/20 0700) Resp:  [14-25] 14  (01/20 0700) BP: (121-143)/(49-78) 135/59 mmHg (01/20 0600) SpO2:  [96 %-99 %] 97 % (01/20 0700) FiO2 (%):  [29.7 %-30.3 %] 30 % (01/20 0700) Weight:  [85.4 kg (188 lb 4.4 oz)] 85.4 kg (188 lb 4.4 oz) (01/20 0500)  Intake/Output from previous day: 01/19 0701 - 01/20 0700 In: 780 [I.V.:120; NG/GT:660] Out: 3045 [Urine:3045] Intake/Output this shift:   PE sedation off 24 hours - no eye opening, localizes right, withdraws left - pupils reactive -   Lab Results:  Basename 06/27/11 0443 06/26/11 0400  WBC 15.6* 14.8*  HGB 11.0* 10.3*  HCT 32.7* 30.5*  PLT 119* 136*   BMET  Basename 06/27/11 0443 06/26/11 0400  NA 139 142  K 3.0* 3.3*  CL 105 108  CO2 27 25  GLUCOSE 123* 99  BUN 26* 31*  CREATININE 0.53 0.63  CALCIUM 8.4 8.2*    Studies/Results: Ct Head Wo Contrast  06/26/2011  *RADIOLOGY REPORT*  Clinical Data: Follow-up intracranial hemorrhage.  CT HEAD WITHOUT CONTRAST  Technique:  Contiguous axial images were obtained from the base of the skull through the vertex without contrast.  Comparison: 06/25/2011.  Findings: Changes of prior right frontal craniotomy again noted, unchanged.  Blood within the right frontal lobe is stable as is the right subdural hematoma.  Midline shift is stable at 9 mm.  No hydrocephalus.  Small amount of blood within the left lateral ventricle, also stable.  Stable blood along the tentorium.  IMPRESSION: No interval change.  Original Report Authenticated By: Cyndie Chime, M.D.   Dg Chest Port 1 View  06/27/2011  *RADIOLOGY REPORT*  Clinical Data: Intubated.  PORTABLE CHEST - 1 VIEW  Comparison: 06/26/2011  Findings: Support devices are stable.  Improving aeration with decreasing bibasilar atelectasis.  Mild vascular congestion  persist.  Heart is borderline in size.  IMPRESSION: Decreasing bibasilar atelectasis.  Original Report Authenticated By: Cyndie Chime, M.D.   Dg Chest Port 1 View  06/26/2011  *RADIOLOGY REPORT*  Clinical Data: Status post craniotomy 06/22/2011.  Possible pleural fluid.  PORTABLE CHEST - 1 VIEW  Comparison: Plain film of the chest 06[birth year]1937 06/25/2011.  Findings: The patient has small appearing bilateral pleural effusions with associated basilar airspace disease.  Small focus of airspace opacity in the left upper lobe seen on the prior study is less conspicuous today.  No pneumothorax.  Heart size normal.  IMPRESSION:  1.  Likely small bilateral pleural effusions and basilar airspace disease without notable change. 2.  Airspace opacity in the left upper lobe seen on yesterday's study is less conspicuous.  Original Report Authenticated By: Bernadene Bell. Maricela Curet, M.D.    Assessment/Plan: Continue to wean from vent - but not neurologically ready for extubation at this point -   CPM , repeat CT in am  LOS: 5 days     Jacqueline Christian R, MD 06/27/2011, 8:07 AM

## 2011-06-27 NOTE — Progress Notes (Signed)
PCCM PROGRESS NOTE  HPI:  66 y/o woman with a Hx of HTN admitted with dizziness and visual disturbance. 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 Subsequently Dr. Venetia Maxon (Neurosurgery) performed microdissection/debulking of hemangioma on 1/145/13 at 16:00 pm. At 21:00 the patient became unresponsive and a repeat imaging of her head demonstrated a postoperative subdural hematoma that required an emergent hematoma decompression. Patient tolerated second procedure well. PCCM was consulted for a continued ventilator management.  Antibiotics:   Cefazolin (post surgical prophylaxis only) 1/15/>>>  Cultures/Sepsis Markers:   None  Access/Protocols:  Left radial Aline 1/15>>  NG/OGT 1/15>>  ETT (7.5 mm) 1/15 >>  Best Practice: DVT: Coagulopathic, SCDs GI: Protonix  Subjective: Post-op, sedated and intubated. On PSVR still comatose but beginning to respond  Physical Exam: Filed Vitals:   06/27/11 1152  BP: 122/57  Pulse: 68  Temp: 98.4 F (36.9 C)  Resp: 24    Intake/Output Summary (Last 24 hours) at 06/27/11 1230 Last data filed at 06/27/11 1100  Gross per 24 hour  Intake    710 ml  Output   2570 ml  Net  -1860 ml   Vent Mode:  [-] CPAP;PSV FiO2 (%):  [29.7 %-30.3 %] 30 % Set Rate:  [12 bmp] 12 bmp PEEP:  [5 cmH20] 5 cmH20 Pressure Support:  [10 cmH20] 10 cmH20 Plateau Pressure:  [17 cmH20] 17 cmH20  General: Intubated, sedated off sedation) Neuro:  RASS -3 off sedation. Moving toes to commmand esp on right but will not open eyes.   HEENT: Scalp dressing in place, pupils equal, round reactive to light, ETT in place . Rt facial ecchymosis from craniotomy Cardiovascular: Sinus rhythm  Lungs: CTA B . Doing PSV fine Abdomen: BS+, soft, nontender, no hsm . Tf  infusing Skin: no rashes  Labs:  Lab 06/27/11 0443 06/26/11 0400 06/25/11 0414  NA 139 142 144  K 3.0* 3.3* 3.4*  CL 105 108 108  CO2 27 25 26   BUN 26* 31* 26*  CREATININE 0.53 0.63 0.68  GLUCOSE 123* 99 160*    Lab 06/27/11 0443 06/26/11 0400 06/25/11 0414  HGB 11.0* 10.3* 10.5*  HCT 32.7* 30.5* 30.6*  WBC 15.6* 14.8* 16.1*  PLT 119* 136* 147*    ABG    Component Value Date/Time   PHART 7.519* 06/27/2011 0500   PCO2ART 32.8* 06/27/2011 0500   PO2ART 101.0* 06/27/2011 0500   HCO3 26.5* 06/27/2011 0500   TCO2 27.5 06/27/2011 0500   ACIDBASEDEF 2.2* 06/23/2011 0326   O2SAT 98.5 06/27/2011 0500    Lab 06/27/11 0443  MG 2.2   Lab Results  Component Value Date   CALCIUM 8.4 06/27/2011   PHOS 3.0 06/27/2011    Ct Head Wo Contrast  06/26/2011  *RADIOLOGY REPORT*  Clinical Data: Follow-up intracranial hemorrhage.  CT HEAD WITHOUT CONTRAST  Technique:  Contiguous axial images were obtained from the base of the skull through the vertex without contrast.  Comparison: 06/25/2011.  Findings: Changes of prior right frontal craniotomy again noted, unchanged.  Blood within the right frontal lobe is stable as is the right subdural hematoma.  Midline shift is stable at 9 mm.  No hydrocephalus.  Small amount of blood within the left lateral ventricle, also stable.  Stable blood along the tentorium.  IMPRESSION: No interval change.  Original Report Authenticated By: Cyndie Chime, M.D.  Dg Chest Port 1 View  06/27/2011  *RADIOLOGY REPORT*  Clinical Data: Intubated.  PORTABLE CHEST - 1 VIEW  Comparison: 06/26/2011  Findings: Support devices are stable.  Improving aeration with decreasing bibasilar atelectasis.  Mild vascular congestion persist.  Heart is borderline in size.  IMPRESSION: Decreasing bibasilar atelectasis.  Original Report Authenticated By: Cyndie Chime, M.D.   Dg Chest Port 1 View  06/26/2011  *RADIOLOGY REPORT*  Clinical Data: Status post craniotomy 06/22/2011.  Possible  pleural fluid.  PORTABLE CHEST - 1 VIEW  Comparison: Plain film of the chest 06/23/1937 06/25/2011.  Findings: The patient has small appearing bilateral pleural effusions with associated basilar airspace disease.  Small focus of airspace opacity in the left upper lobe seen on the prior study is less conspicuous today.  No pneumothorax.  Heart size normal.  IMPRESSION:  1.  Likely small bilateral pleural effusions and basilar airspace disease without notable change. 2.  Airspace opacity in the left upper lobe seen on yesterday's study is less conspicuous.  Original Report Authenticated By: Bernadene Bell. Maricela Curet, M.D.    Assessment & Plan: 66 y/o female s/p resection of a meningioma who developed AMS due to an acute subdural and intracranial hemorrhage. Now s/p craniotomy and hematoma evacuation.   NEUROLOGIC:  1. AMS due to continuous sedation with propofol and extension of the bleed/edema and shift. - Daily WUA. - RASS score -3 (improved from -4) - Otherwise per neurosurgery.  2. 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  - Hemodynamically stable.  - NS primary service.  - Continue Cefazolin. - Continue Decadron. - Continue Keprra. - Head CT 06/25/11 noted.  PULMONARY:  Post-operative ventilation management sp cranial surgery  -wean as tolerated per NS. - Hold diureses until patient is stable from a neurologic edema to not affect fluid shift at this time, will keep current fluid management strategy in place. - KVO IVF as hyperchloremia is starting to affect acid/base.  CARDIOVASCULAR: Bradycardia but normotensive, if becomes hypotensive will add dopamine.  Insert TLC today. - Hx of HTN.  - Currently BP's are in lower normal range and adequately controlled.   RENAL:  - Normal renal function. - Replace K and Phos. - Monitor UO (Foley cath to gravity).   GASTROINTESTINAL:  - Patient is with OGT and NPO  - Consult  nutrition for TF. - Daily I/O's.  HEMATOLOGIC:  Anemia/thrombocytopenia, likely due to problems listed above.  -Follow H:H  -CBC And INR in am  -No heparin products  -SCD's for DVT prophylaxis   Lab  06/22/11 2210  06/18/11 1500   HGB  10.5*  12.5   HCT  30.4*  35.9*   WBC  11.5*  18.1*   PLT  109*  230     Lab  06/22/11 2210   INR  1.24     Lab  06/22/11 2210   APTT  22*    INFECTIOUS:  Patint is on empiric therapy with Cefazolin sp cranial surgery.  Afebrile  Will follow NS recs on duration  - Daily WBC's   Lab  06/22/11 2210  06/18/11 1500   WBC  11.5*  18.1*    ENDOCRINE:  No Hx of DM.  -on Decadron therapy  -on ICU Hyperglycemia protocol   Brett Canales Minor ACNP Adolph Pollack PCCM Pager [phone] till 3 pm If no answer page [phone] 06/27/2011, 12:30 PM    STAFF NOTE: I, Dr Lavinia Sharps have personally reviewed  patient's available data, including medical history, events of note, physical examination and test results as part of my evaluation. I have discussed with resident/NP and other care providers such as pharmacist, RN and RRT.  In addition,  I personally evaluated patient and elicited key findings of acute respiratory failure and failure to extubate due to coma which is somehwat improved. Not ready for extubation 1/20.  Rest per NP whose note is outlined above and that I agree with  The patient is critically ill with multiple organ systems failure and requires high complexity decision making for assessment and support, frequent evaluation and titration of therapies, application of advanced monitoring technologies and extensive interpretation of multiple databases.   Critical Care Time devoted to patient care services described in this note is  31  Minutes.  Dr. Kalman Shan, M.D., [facility].C.P Pulmonary and Critical Care Medicine Staff Physician  System [place] [facility] Pager: [phone], If no answer or between  15:00h - 7:00h: call  [phone]  06/27/2011 12:31 PM

## 2011-06-28 ENCOUNTER — Inpatient Hospital Stay (HOSPITAL_COMMUNITY): Payer: Medicare Other

## 2011-06-28 DIAGNOSIS — I619 Nontraumatic intracerebral hemorrhage, unspecified: Secondary | ICD-10-CM

## 2011-06-28 DIAGNOSIS — I10 Essential (primary) hypertension: Secondary | ICD-10-CM

## 2011-06-28 DIAGNOSIS — Z9911 Dependence on respirator [ventilator] status: Secondary | ICD-10-CM

## 2011-06-28 LAB — CBC
Hemoglobin: 10.8 g/dL — ABNORMAL LOW (ref 12.0–15.0)
MCHC: 33.5 g/dL (ref 30.0–36.0)
Platelets: 131 10*3/uL — ABNORMAL LOW (ref 150–400)
RDW: 13.9 % (ref 11.5–15.5)

## 2011-06-28 LAB — BASIC METABOLIC PANEL
Calcium: 8.6 mg/dL (ref 8.4–10.5)
GFR calc Af Amer: >90 mL/min (ref >90)
GFR calc non Af Amer: >90 mL/min (ref >90)
Glucose, Bld: 120 mg/dL — ABNORMAL HIGH (ref 70–99)
Potassium: 3.4 mEq/L — ABNORMAL LOW (ref 3.5–5.1)
Sodium: 139 mEq/L (ref 135–145)

## 2011-06-28 LAB — GLUCOSE, CAPILLARY
Glucose-Capillary: 125 mg/dL — ABNORMAL HIGH (ref 70–99)
Glucose-Capillary: 108 mg/dL — ABNORMAL HIGH (ref 70–99)
Glucose-Capillary: 117 mg/dL — ABNORMAL HIGH (ref 70–99)

## 2011-06-28 LAB — MAGNESIUM: Magnesium: 2.2 mg/dL (ref 1.5–2.5)

## 2011-06-28 LAB — CULTURE, RESPIRATORY W GRAM STAIN

## 2011-06-28 LAB — PHOSPHORUS: Phosphorus: 2.7 mg/dL (ref 2.3–4.6)

## 2011-06-28 MED ORDER — RACEPINEPHRINE HCL 2.25 % IN NEBU
INHALATION_SOLUTION | RESPIRATORY_TRACT | Status: AC
  Administered 2011-06-28: 0.5 mL via RESPIRATORY_TRACT
  Filled 2011-06-28: qty 0.5

## 2011-06-28 MED ORDER — MIDAZOLAM HCL 2 MG/2ML IJ SOLN: 1.0000 mg | INTRAMUSCULAR | Status: DC | PRN

## 2011-06-28 MED ORDER — MIDAZOLAM HCL 2 MG/2ML IJ SOLN
INTRAMUSCULAR | Status: AC
  Administered 2011-06-28: 4 mg
  Filled 2011-06-28: qty 4

## 2011-06-28 MED ORDER — FENTANYL CITRATE 0.05 MG/ML IJ SOLN
25.0000 ug | INTRAMUSCULAR | Status: DC | PRN
  Administered 2011-06-28: 50 ug via INTRAVENOUS
  Administered 2011-06-29: 25 ug via INTRAVENOUS
  Administered 2011-06-29: 50 ug via INTRAVENOUS
  Administered 2011-06-29 – 2011-07-01 (×4): 25 ug via INTRAVENOUS
  Administered 2011-07-02: 50 ug via INTRAVENOUS
  Filled 2011-06-28 (×5): qty 2
  Filled 2011-06-28: qty 4

## 2011-06-28 MED ORDER — RACEPINEPHRINE HCL 2.25 % IN NEBU
0.5000 mL | INHALATION_SOLUTION | Freq: Once | RESPIRATORY_TRACT | Status: AC
  Administered 2011-06-28: 0.5 mL via RESPIRATORY_TRACT

## 2011-06-28 MED ORDER — DEXTROSE 5 % IV SOLN
1.0000 g | INTRAVENOUS | Status: AC
  Administered 2011-06-28 – 2011-07-04 (×7): 1 g via INTRAVENOUS
  Filled 2011-06-28 (×7): qty 10

## 2011-06-28 MED ORDER — MIDAZOLAM HCL 5 MG/ML IJ SOLN: 4.0000 mg | Freq: Once | INTRAMUSCULAR | Status: AC

## 2011-06-28 MED ORDER — DEXTROSE-NACL 5-0.9 % IV SOLN
INTRAVENOUS | Status: DC
  Administered 2011-06-28: 11:00:00 via INTRAVENOUS
  Administered 2011-06-29: 50 mL/h via INTRAVENOUS
  Administered 2011-06-30 – 2011-07-01 (×2): via INTRAVENOUS

## 2011-06-28 MED ORDER — FENTANYL CITRATE 0.05 MG/ML IJ SOLN
12.5000 ug | INTRAMUSCULAR | Status: DC | PRN
  Filled 2011-06-28: qty 4

## 2011-06-28 MED ORDER — METHYLPREDNISOLONE SODIUM SUCC 125 MG IJ SOLR
80.0000 mg | Freq: Once | INTRAMUSCULAR | Status: AC
  Administered 2011-06-28: 80 mg via INTRAVENOUS
  Filled 2011-06-28: qty 2

## 2011-06-28 NOTE — Progress Notes (Signed)
PCCM PROGRESS NOTE  HPI:  66 y/o woman with a Hx of HTN admitted with dizziness and visual disturbance. 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 Subsequently Dr. Venetia Maxon (Neurosurgery) performed microdissection/debulking of hemangioma on 1/145/13 at 16:00 pm. At 21:00 the patient became unresponsive and a repeat imaging of her head demonstrated a postoperative subdural hematoma that required an emergent hematoma decompression. Patient tolerated second procedure well. PCCM was consulted for a continued ventilator management.  Antibiotics:   Cefazolin (post surgical prophylaxis only) 1/15 >> stopped Ceftriaxone 1/21 >>   Cultures/Sepsis Markers:   Resp 1/21 >>   Access/Protocols:  Left radial Aline 1/15>> 1/20 ETT (7.5 mm) 1/15 >> 1/21  Best Practice: DVT: Coagulopathic, SCDs GI: Protonix   Physical Exam: Filed Vitals:   06/28/11 1300  BP: 133/50  Pulse: 66  Temp:   Resp: 30    Intake/Output Summary (Last 24 hours) at 06/28/11 1500 Last data filed at 06/28/11 1200  Gross per 24 hour  Intake 524.17 ml  Output   1885 ml  Net -1360.83 ml   Vent Mode:  [-] BIPAP FiO2 (%):  [29.4 %-35 %] 30 % Set Rate:  [10 bmp-12 bmp] 12 bmp Vt Set:  [500 mL] 500 mL PEEP:  [4.6 cmH20-5 cmH20] 5 cmH20 Pressure Support:  [10 cmH20] 10 cmH20 Plateau Pressure:  [15 cmH20-20 cmH20] 17 cmH20  General: Intubated, sedated off sedation) Neuro:  RASS -2 off sedation. Moving toes to commmand esp on right but will not open eyes.   HEENT: Scalp dressing in place, pupils equal, round reactive to light, ETT in place . Rt facial ecchymosis from craniotomy Cardiovascular: Sinus rhythm  Lungs: Scattered rhonchi. No wheezes Abdomen: BS+, soft, nontender, no hsm . Tf infusing Skin: no  rashes  Labs:  Lab 06/28/11 0400 06/27/11 0443 06/26/11 0400  NA 139 139 142  K 3.4* 3.0* 3.3*  CL 105 105 108  CO2 26 27 25   BUN 28* 26* 31*  CREATININE 0.55 0.53 0.63  GLUCOSE 120* 123* 99    Lab 06/28/11 0400 06/27/11 0443 06/26/11 0400  HGB 10.8* 11.0* 10.3*  HCT 32.2* 32.7* 30.5*  WBC 19.5* 15.6* 14.8*  PLT 131* 119* 136*     Lab 06/28/11 0400  MG 2.2   Lab Results  Component Value Date   CALCIUM 8.6 06/28/2011   PHOS 2.7 06/28/2011    CXR: L basilar opacity  Assessment & Plan: 66 y/o female s/p resection of a meningioma who developed AMS due to an acute subdural and intracranial hemorrhage. Now s/p craniotomy and hematoma evacuation.   NEUROLOGIC:  - Mgmt per Dr Venetia Maxon. I spoke with Dr Venetia Maxon who believes her neuro status is sufficient to allow for attempt @ extubation  PULMONARY:  Passed SBT, trial of extubation 1/21 > looks very marginal with poor spontaneous cough and stridor post extubation. - Racemic epi X 1 > given with some improvement - Solumedrol X 1 - PRN BiPAP started.  - High risk of needing re-intubation. Family apprised  CARDIOVASCULAR: No active issues presently  RENAL:  - Normal renal function. - Replace K and Phos. - Monitor UO (Foley cath to gravity).   GASTROINTESTINAL:  - NPO after extubation  HEMATOLOGIC:  Anemia/thrombocytopenia, likely due to problems listed above.  -Follow CBC -No heparin products  -SCD's for DVT prophylaxis   Lab  06/22/11  2210  06/18/11 1500   HGB  10.5*  12.5   HCT  30.4*  35.9*   WBC  11.5*  18.1*   PLT  109*  230     INFECTIOUS:  New LLL infiltrate with purulent sputum. Resp specimen sent 1/21. Empiric ceftriaxone started 1/21 for possible PNA   ENDOCRINE:  No Hx of DM.  -on Decadron therapy  -on ICU Hyperglycemia protocol     45 min CCM time  Billy Fischer, MD;  PCCM service; Mobile [phone]  06/28/2011 3:00 PM

## 2011-06-28 NOTE — Progress Notes (Signed)
Utilization review completed. Samanthia Howland, RN, BSN. 06/28/11 

## 2011-06-28 NOTE — Clinical Documentation Improvement (Signed)
Abnormal Labs Clarification  THIS DOCUMENT IS NOT A PERMANENT PART OF THE MEDICAL RECORD  TO RESPOND TO THE THIS QUERY, FOLLOW THE INSTRUCTIONS BELOW:  1. If needed, update documentation for the patient's encounter via the notes activity.  2. Access this query again and click edit on the [employer].  3. After updating, or not, click F2 to complete all highlighted (required) fields concerning your review. Select "additional documentation in the medical record" OR "no additional documentation provided".  4. Click Sign note button.  5. The deficiency will fall out of your InBasket *Please let us know if you are not able to complete this workflow by phone or e-mail (listed below).  Please update your documentation within the medical record to reflect your response to this query.                                                                                   06/28/11  Dear Dr. Sung Amabile Marton Redwood  In a better effort to capture your patient's severity of illness, reflect appropriate length of stay and utilization of resources, a review of the medical record has revealed the following indicators.   Based on your clinical judgment, please clarify and document in a progress note and/or discharge summary the clinical condition associated with the following supporting information: In responding to this query please exercise your independent judgment.  The fact that a query is asked, does not imply that any particular answer is desired or expected.  Abnormal findings (laboratory, x-ray, pathologic, and other diagnostic results) are not coded and reported unless the physician indicates their clinical significance.   The medical record reflects the following clinical findings, please clarify the diagnostic and/or clinical significance:      Please consider the below (if your clinical findings/judgment agree) as you document the patient's diagnosis/condition(s) in the progress note and discharge  summary. Thank you!  Possible Clinical Conditions?  - Hypokalemia   - Other condition (please document in the progress notes and/or discharge summary)  - Cannot Clinically determine at this time   Supporting Information:  -  Component Potassium  Latest Ref Rng 3.5 - 5.1 mEq/L  06/24/2011 3.3 (L)  06/25/2011 3.4 (L)  06/26/2011 3.3 (L)  06/27/2011 3.0 (L)  06/28/2011 3.4 (L)   - potassium chloride 20 MEQ/15ML (10%) liquid 40 mEq ordered 1/20     Additional documentation in chart upon review. SM    Thank You,  Saul Fordyce  Clinical Documentation Specialist: [phone] Pager  Health Information Management [place]

## 2011-06-28 NOTE — Procedures (Signed)
Intubation Procedure Note Jacqueline Christian [id number] [birth year]  Procedure: Intubation Indications: Failed extubation due to stridor and copious secretions  Procedure Details Consent: Performed urgently Time Out: Verified patient identification, verified procedure, site/side was marked, verified correct patient position, special equipment/implants available, medications/allergies/relevent history reviewed, required imaging and test results available.  Performed Versed 4 mg, Fentanyl 50 mcg Mac 3 blade - difficult visualization due to anterior position.  7.5 ETT passed on first attempt Cords edematous and passage of ETT was mildly difficult Copious purulent secretions in ETT after placement Position confirmed with EZ cap and auscultation    Evaluation Hemodynamic Status: BP stable throughout; O2 sats: stable throughout Patient's Current Condition: stable Complications: No apparent complications Patient did tolerate procedure well. Chest X-ray ordered to verify placement.  CXR: pending.   Billy Fischer 06/28/2011

## 2011-06-28 NOTE — Progress Notes (Signed)
Subjective: Patient reports intubated.  Nods to questions and opens eyes.  Objective: Vital signs in last 24 hours: Temp:  [98.4 F (36.9 C)-100.7 F (38.2 C)] 99.6 F (37.6 C) (01/21 0400) Pulse Rate:  [55-81] 55  (01/21 0600) Resp:  [14-26] 19  (01/21 0600) BP: (110-133)/(46-62) 110/46 mmHg (01/21 0600) SpO2:  [96 %-98 %] 97 % (01/21 0600) FiO2 (%):  [29.4 %-30.3 %] 29.8 % (01/21 0600) Weight:  [81.6 kg (179 lb 14.3 oz)] 81.6 kg (179 lb 14.3 oz) (01/21 0400)  Intake/Output from previous day: 01/20 0701 - 01/21 0700 In: 505 [NG/GT:400; IV Piggyback:105] Out: 2160 [Urine:2160] Intake/Output this shift: Total I/O In: 265 [NG/GT:160; IV Piggyback:105] Out: 935 [Urine:935]  Physical Exam: Nods and opens eyes.  PERRL. F/C all 4 extremities, Right > left, but will use left side with encouragement.  Dressing removed.  Wound CDI.  Lab Results:  Basename 06/28/11 0400 06/27/11 0443  WBC 19.5* 15.6*  HGB 10.8* 11.0*  HCT 32.2* 32.7*  PLT 131* 119*   BMET  Basename 06/28/11 0400 06/27/11 0443  NA 139 139  K 3.4* 3.0*  CL 105 105  CO2 26 27  GLUCOSE 120* 123*  BUN 28* 26*  CREATININE 0.55 0.53  CALCIUM 8.6 8.4    Studies/Results: Dg Chest Port 1 View  06/27/2011  *RADIOLOGY REPORT*  Clinical Data: Intubated.  PORTABLE CHEST - 1 VIEW  Comparison: 06/26/2011  Findings: Support devices are stable.  Improving aeration with decreasing bibasilar atelectasis.  Mild vascular congestion persist.  Heart is borderline in size.  IMPRESSION: Decreasing bibasilar atelectasis.  Original Report Authenticated By: Cyndie Chime, M.D.   Dg Chest Port 1 View  06/26/2011  *RADIOLOGY REPORT*  Clinical Data: Status post craniotomy 06/22/2011.  Possible pleural fluid.  PORTABLE CHEST - 1 VIEW  Comparison: Plain film of the chest 06/23/1937 06/25/2011.  Findings: The patient has small appearing bilateral pleural effusions with associated basilar airspace disease.  Small focus of airspace opacity  in the left upper lobe seen on the prior study is less conspicuous today.  No pneumothorax.  Heart size normal.  IMPRESSION:  1.  Likely small bilateral pleural effusions and basilar airspace disease without notable change. 2.  Airspace opacity in the left upper lobe seen on yesterday's study is less conspicuous.  Original Report Authenticated By: Bernadene Bell. Maricela Curet, M.D.    Assessment/Plan: Improving neurologically.  CT also improved with 7mm shift (down from 1.2cm).  OK to wean and extubate.  Continue in ICU and continue decadron at 6mg  Q 6 Hours.    LOS: 6 days    Dorian Heckle, MD 06/28/2011, 6:46 AM

## 2011-06-28 NOTE — Procedures (Signed)
Extubation Procedure Note  Patient Details:   Name: KRYSTIN KEEVEN DOB: 20-Jan-1946 MRN: [id number]   Airway Documentation:     Evaluation  O2 sats: stable throughout Complications: No apparent complications Patient   tolerate procedure well. Bilateral Breath Sounds: clear/no Stridor Suctioning: Oral;Airway prior to Extubation Yes  500 Hz 1000 Hz 2000 Hz 4000 Hz  Right      Left        Joylene John 06/28/2011, 9:25 AM

## 2011-06-28 NOTE — Progress Notes (Signed)
OT Discharge Note  Patient is being discharged from OT services secondary to:    D/c order received   Pt currently intubated awaiting extubation  OT will await reorder from MD as appropriate    [name]   OTR/L Pager: [phone] Office: [phone] .

## 2011-06-28 NOTE — Progress Notes (Signed)
Dr. Sung Amabile at bedside, patient continued to have difficulty maintaining airway, decision was made to intubate.  Versed 4mg  and fentanyl given per MD.  Patient tolerated well.  Dr. Venetia Maxon, primary MD updated.  Attempted to notify family however unable to get in touch with either contact, will continue to try.

## 2011-06-29 ENCOUNTER — Inpatient Hospital Stay (HOSPITAL_COMMUNITY): Payer: Medicare Other

## 2011-06-29 DIAGNOSIS — Z9911 Dependence on respirator [ventilator] status: Secondary | ICD-10-CM

## 2011-06-29 DIAGNOSIS — J13 Pneumonia due to Streptococcus pneumoniae: Secondary | ICD-10-CM

## 2011-06-29 DIAGNOSIS — J9600 Acute respiratory failure, unspecified whether with hypoxia or hypercapnia: Secondary | ICD-10-CM

## 2011-06-29 DIAGNOSIS — I619 Nontraumatic intracerebral hemorrhage, unspecified: Secondary | ICD-10-CM

## 2011-06-29 LAB — GLUCOSE, CAPILLARY
Glucose-Capillary: 136 mg/dL — ABNORMAL HIGH (ref 70–99)
Glucose-Capillary: 164 mg/dL — ABNORMAL HIGH (ref 70–99)

## 2011-06-29 LAB — CBC
Hemoglobin: 10.3 g/dL — ABNORMAL LOW (ref 12.0–15.0)
MCH: 30.1 pg (ref 26.0–34.0)
MCHC: 34 g/dL (ref 30.0–36.0)
RBC: 3.42 MIL/uL — ABNORMAL LOW (ref 3.87–5.11)
WBC: 21.2 10*3/uL — ABNORMAL HIGH (ref 4.0–10.5)

## 2011-06-29 LAB — BLOOD GAS, ARTERIAL
Acid-Base Excess: 1.6 mmol/L (ref 0.0–2.0)
Bicarbonate: 24.8 mEq/L — ABNORMAL HIGH (ref 20.0–24.0)
Drawn by: 331761
FIO2: 0.3 %
O2 Saturation: 98.5 %
RATE: 14 resp/min
TCO2: 25.9 mmol/L (ref 0–100)
pCO2 arterial: 34.8 mmHg — ABNORMAL LOW (ref 35.0–45.0)
pO2, Arterial: 102 mmHg — ABNORMAL HIGH (ref 80.0–100.0)

## 2011-06-29 LAB — BASIC METABOLIC PANEL
CO2: 24 mEq/L (ref 19–32)
GFR calc non Af Amer: >90 mL/min (ref >90)
Glucose, Bld: 133 mg/dL — ABNORMAL HIGH (ref 70–99)
Potassium: 3.6 mEq/L (ref 3.5–5.1)
Sodium: 141 mEq/L (ref 135–145)

## 2011-06-29 MED ORDER — FREE WATER
100.0000 mL | Freq: Three times a day (TID) | Status: DC
  Administered 2011-06-29 – 2011-07-01 (×8): 100 mL

## 2011-06-29 MED ORDER — OSMOLITE 1.2 CAL PO LIQD
1000.0000 mL | ORAL | Status: DC
  Administered 2011-06-29 – 2011-07-04 (×3): 1000 mL
  Filled 2011-06-29 (×9): qty 1000

## 2011-06-29 MED ORDER — PANTOPRAZOLE SODIUM 40 MG IV SOLR
40.0000 mg | INTRAVENOUS | Status: DC
  Filled 2011-06-29: qty 40

## 2011-06-29 MED ORDER — PANTOPRAZOLE SODIUM 40 MG IV SOLR
40.0000 mg | Freq: Once | INTRAVENOUS | Status: AC
  Administered 2011-06-29: 40 mg via INTRAVENOUS
  Filled 2011-06-29: qty 40

## 2011-06-29 MED ORDER — PANTOPRAZOLE SODIUM 40 MG PO PACK
40.0000 mg | PACK | Freq: Every day | ORAL | Status: DC
  Administered 2011-06-29 – 2011-07-07 (×9): 40 mg
  Filled 2011-06-29 (×9): qty 20

## 2011-06-29 NOTE — Progress Notes (Signed)
Nutrition Follow-up  Diet Order:  NPO  Meds: Scheduled Meds:   . antiseptic oral rinse  15 mL Mouth Rinse QID  . cefTRIAXone (ROCEPHIN)  IV  1 g Intravenous Q24H  . chlorhexidine  15 mL Mouth Rinse BID  . free water  100 mL Per Tube Q8H  . levetiracetam  500 mg Intravenous Q12H  . methylPREDNISolone (SOLU-MEDROL) injection  80 mg Intravenous Once  . midazolam      . midazolam  4 mg Intravenous Once  . pantoprazole (PROTONIX) IV  40 mg Intravenous Once  . pantoprazole sodium  40 mg Per Tube Q1200  . Racepinephrine HCl  0.5 mL Nebulization Once  . DISCONTD: pantoprazole (PROTONIX) IV  40 mg Intravenous Q24H   Continuous Infusions:   . dextrose 5 % and 0.9% NaCl 50 mL/hr at 06/29/11 0800  . feeding supplement (OSMOLITE 1.2 CAL)     PRN Meds:.acetaminophen, acetaminophen-codeine, fentaNYL, labetalol, midazolam, ondansetron (ZOFRAN) IV, ondansetron, DISCONTD: fentaNYL  Labs:  CMP     Component Value Date/Time   NA 141 06/29/2011 0525   K 3.6 06/29/2011 0525   CL 108 06/29/2011 0525   CO2 24 06/29/2011 0525   GLUCOSE 133* 06/29/2011 0525   BUN 24* 06/29/2011 0525   CREATININE 0.51 06/29/2011 0525   CALCIUM 8.6 06/29/2011 0525   PROT 5.2* 06/23/2011 0430   ALBUMIN 3.0* 06/23/2011 0430   AST 31 06/23/2011 0430   ALT 54* 06/23/2011 0430   ALKPHOS 38* 06/23/2011 0430   BILITOT 0.9 06/23/2011 0430   GFRNONAA >90 06/29/2011 0525   GFRAA >90 06/29/2011 0525     Intake/Output Summary (Last 24 hours) at 06/29/11 0948 Last data filed at 06/29/11 0800  Gross per 24 hour  Intake 1324.17 ml  Output   2015 ml  Net -690.83 ml   Pt extubated briefly 1/21, but could not maintain her airway and was re-intubated. Per RN will remain intubated for a few days at least. TF was previously stopped for extubation. Per RN plan is to place panda and start TF. Dr. Bard Herbert has ordered Osmolite 1.2 @ 20 ml/hr which will provide 576 kcals, 26 grams protein, 388 ml H2O. Per discussion with Dr Bard Herbert he would  prefer to keep pt at 20 ml/hr for first 24 hrs to ensure her tolerance to TF. Per MD no GI issues at this time. Propofol d/c'ed   Weight Status:  179 lbs (weigth range 179-199 lbs) admit wt was 190 lbs BMI remains 31.7-class 1 obesity  Re-estimated needs:   Kcals: 1505 Protein: >104 grams protein Fluid: >1.8L/day  Nutrition Dx: Inadequate oral intake (NI-2.1). Status: Ongoing    Goal:  Enteral nutrition to provide 60-70% of estimated calorie needs (22-25 kcals/kg ideal body weight) and 100% of estimated protein needs, based on ASPEN guidelines for permissive underfeeding in critically ill obese individuals.   Intervention:    Recommend advance Osmolite 1.2 to goal of 35 ml/hr tomorrow (per MD desire)  30 ml Prostat QID  TF regimen will provide 1296 kcals (25 kcals/kg IBW) and 105 grams protein (100% of protein needs)  Monitor:  TF tolerance, weight, labs   Kendell Bane Cornel[place] Pager #:  [phone]

## 2011-06-29 NOTE — Progress Notes (Signed)
Subjective: Patient reports (Intubated...nods in response to questions.)  Objective: Vital signs in last 24 hours: Temp:  [99.3 F (37.4 C)-100.2 F (37.9 C)] 99.3 F (37.4 C) (01/22 0400) Pulse Rate:  [58-93] 59  (01/22 0700) Resp:  [11-31] 16  (01/22 0700) BP: (109-155)/(48-66) 120/57 mmHg (01/22 0700) SpO2:  [95 %-100 %] 98 % (01/22 0700) FiO2 (%):  [29.4 %-35 %] 30.4 % (01/22 0700)  Intake/Output from previous day: 01/21 0701 - 01/22 0700 In: 1314.2 [I.V.:1019.2; NG/GT:40; IV Piggyback:255] Out: 1975 [Urine:1975] Intake/Output this shift: Total I/O In: 50 [I.V.:50] Out: 110 [Urine:110]  Opens eyes to voice. Nods responding to questions. Follows commands. PEARL. Incision without erythema or drainage. Family present, optimistic & thankful for care & progress.  Lab Results:  Basename 06/29/11 0525 06/28/11 0400  WBC 21.2* 19.5*  HGB 10.3* 10.8*  HCT 30.3* 32.2*  PLT 158 131*   BMET  Basename 06/29/11 0525 06/28/11 0400  NA 141 139  K 3.6 3.4*  CL 108 105  CO2 24 26  GLUCOSE 133* 120*  BUN 24* 28*  CREATININE 0.51 0.55  CALCIUM 8.6 8.6    Studies/Results: Ct Head Wo Contrast  06/28/2011  *RADIOLOGY REPORT*  Clinical Data: 66 year old female status post surgical resection of right sphenoid wing meningioma.  Intracranial hemorrhage.  CT HEAD WITHOUT CONTRAST  Technique:  Contiguous axial images were obtained from the base of the skull through the vertex without contrast.  Comparison: 06/26/2011 and earlier.  Findings: The patient is intubated.  Sequelae of right frontotemporal craniotomy. Stable visualized osseous structures. Visualized paranasal sinuses and mastoids are clear.  Postoperative changes to the scalp soft tissues.  Skin staples in place.  Decreased pneumocephalus and subcutaneous emphysema.Intra-axial and extra-axial hemorrhage in the right hemisphere associated with 9 mm of leftward midline shift again noted.  The extra-axial component which appears to be  of dural is stable since 06/25/2011 measuring up to 10 mm in thickness.  Multi focal right frontal lobe hemorrhage with surrounding edema has not significantly changed since 06/25/2011.  Trace subdural layering along the tentorium. Trace intraventricular hemorrhage.  No ventriculomegaly.  Posterior fossa cisterns remain patent.  No new areas of cerebral edema or acute cortically based infarction. No other intracranial mass lesion identified.  IMPRESSION: 1.  Stable right hemisphere intra-axial and extra-axial hemorrhage since 06/25/2011 associated with 10 mm of leftward midline shift. 2.  Mildly decreased pneumocephalus and subcutaneous emphysema, otherwise stable postoperative changes. 3.  No new intracranial abnormality.  Original Report Authenticated By: Harley Hallmark, M.D.   Dg Chest Port 1 View  06/29/2011  *RADIOLOGY REPORT*  Clinical Data: Respiratory failure  PORTABLE CHEST - 1 VIEW  Comparison: June 28, 2011  Findings: The ET tube tip is in good position, above the carina. Two right central line tips are at the cavoatrial junction.  No pneumothorax.  There is a shallow inspiration which accentuates lung markings; however, pulmonary vasculature is within normal limits.  There is mild left basilar atelectasis.  IMPRESSION: Mild left basilar atelectasis, unchanged.  Original Report Authenticated By: Brandon Melnick, M.D.   Dg Chest Port 1 View  06/28/2011  *RADIOLOGY REPORT*  Clinical Data: Status post reintubation.  PORTABLE CHEST - 1 VIEW  Comparison: Portable chest 06/28/2011 at 6:48 a.m.  Findings: Endotracheal tube is identified with tip in good position 3 cm above the carina.  Right IJ catheter is again noted. There is some basilar airspace opacity, greater on the left.  No pneumothorax.  Heart size normal.  IMPRESSION:  1.  ET tube in good position. 2.  Patchy left basilar airspace disease is likely due to atelectasis.  Original Report Authenticated By: Bernadene Bell. Maricela Curet, M.D.   Dg Chest  Port 1 View  06/28/2011  *RADIOLOGY REPORT*  Clinical Data: Endotracheal tube evaluation.  PORTABLE CHEST - 1 VIEW  Comparison: 06/27/2011.  Findings: Endotracheal tube tip located centrally 3.9 cm above the carina.  Right central line tip mid superior vena cava level.  Nasogastric tube side hole beyond the gastroesophageal junction with the tip at the level of the proximal gastric body.  Poor inspiration with elevation left hemidiaphragm.  Consolidation left base may represent atelectasis.  Infiltrate not excluded.  Central pulmonary vascular prominence.  No gross pneumothorax.  Calcified mildly tortuous aorta.  IMPRESSION: Poor inspiration with elevation left hemidiaphragm.  Consolidation left base may represent atelectasis.  Infiltrate not excluded.  Central pulmonary vascular prominence.  Original Report Authenticated By: Fuller Canada, M.D.    Assessment/Plan: Improving neurologically. Continuing to attempt/wean extubate by critical care.   LOS: 7 days  Continue support.   Georgiann Cocker 06/29/2011, 9:24 AM     Continue ventilator and respiratory management per CCM.  I appreciate Dr. Westley Hummer help.

## 2011-06-29 NOTE — Progress Notes (Addendum)
PCCM PROGRESS NOTE  HPI:  66 y/o woman with a Hx of HTN admitted with dizziness and visual disturbance. 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 Subsequently Dr. Venetia Maxon (Neurosurgery) performed microdissection/debulking of hemangioma on 1/145/13 at 16:00 pm. At 21:00 the patient became unresponsive and a repeat imaging of her head demonstrated a postoperative subdural hematoma that required an emergent hematoma decompression. Patient tolerated second procedure well. PCCM was consulted for a continued ventilator management.  Antibiotics:   Cefazolin (post surgical prophylaxis only) 1/15 >> stopped Ceftriaxone 1/21 >>   Cultures/Sepsis Markers:   Resp 1/21 >> multiple organisms on GS >>  Access/Protocols:  Left radial Aline 1/15>> 1/20 ETT (7.5 mm) 1/15 >> 1/21 (failed due to stridor and secretions) ETT 1/21 >>  Best Practice: DVT: Coagulopathic, SCDs GI: Protonix  SUBJ: Comfortable on vent. RASS -1. + F/C  Physical Exam: Filed Vitals:   06/29/11 1000  BP: 121/58  Pulse: 56  Temp:   Resp: 19    Intake/Output Summary (Last 24 hours) at 06/29/11 1144 Last data filed at 06/29/11 1000  Gross per 24 hour  Intake 1374.17 ml  Output   1890 ml  Net -515.83 ml   Vent Mode:  [-] PSV;CPAP FiO2 (%):  [29.4 %-35 %] 29.8 % Set Rate:  [12 bmp-14 bmp] 14 bmp Vt Set:  [420 mL] 420 mL PEEP:  [5 cmH20] 5 cmH20 Pressure Support:  [5 cmH20] 5 cmH20 Plateau Pressure:  [15 cmH20-19 cmH20] 17 cmH20  General: Intubated, sedated off sedation) Neuro:  RASS -1. + F/C. L hemiparesis   HEENT: Scalp wound clean and dry, pupils equal, round reactive to light, ETT in place . Rt facial ecchymosis from craniotomy Cardiovascular: Sinus rhythm, no murmur Lungs: Scattered rhonchi. No  wheezes Abdomen: BS+, soft, nontender, no hsm . Tf infusing Skin: no rashes  Labs:  Lab 06/29/11 0525 06/28/11 0400 06/27/11 0443  NA 141 139 139  K 3.6 3.4* 3.0*  CL 108 105 105  CO2 24 26 27   BUN 24* 28* 26*  CREATININE 0.51 0.55 0.53  GLUCOSE 133* 120* 123*    Lab 06/29/11 0525 06/28/11 0400 06/27/11 0443  HGB 10.3* 10.8* 11.0*  HCT 30.3* 32.2* 32.7*  WBC 21.2* 19.5* 15.6*  PLT 158 131* 119*     Lab 06/28/11 0400  MG 2.2   Lab Results  Component Value Date   CALCIUM 8.6 06/29/2011   PHOS 2.7 06/28/2011    CXR: NSC L basilar opacity  Assessment & Plan: 66 y/o female s/p resection of a meningioma who developed AMS due to an acute subdural and intracranial hemorrhage. Now s/p craniotomy and hematoma evacuation.   NEUROLOGIC:  - Mgmt per Dr Venetia Maxon. Cognition seems improved today. Keep sedation as light as possible  PULMONARY:  Failed extubation 1/21 due to stridor and secretions. Tolerates PSV mode. Will treat presumed PNA for 48-72 hrs and reassess. Needs to pass SBT AND cuff leak test prior to next extubation trial. Discussed possibility of trach with husband and son   CARDIOVASCULAR: No active issues presently  RENAL:  - Normal renal function. - Replace K and Phos. - Monitor UO (Foley cath to gravity).   GASTROINTESTINAL:  - NPO after extubation  HEMATOLOGIC:  Anemia/thrombocytopenia, likely due to problems listed above.  -Follow CBC -No heparin products  -SCD's for DVT prophylaxis   INFECTIOUS:  Cont ceftrx and f/u cx results   ENDOCRINE:  No Hx of DM.  -off Decadron therapy  -Cont ICU Hyperglycemia protocol for now until TFs @ goal -TFs started 1/22   35 min CCM time  Billy Fischer, MD;  PCCM service; Mobile [phone]  06/29/2011 11:44 AM

## 2011-06-29 NOTE — Plan of Care (Signed)
Problem: Inadequate Intake (NI-2.1) Goal: Food and/or nutrient delivery Individualized approach for food/nutrient provision.  Outcome: Progressing Panda to be placed and Osmolite 1.2 started. Recommend advance TF to goal of 35 ml/hr with 30 ml Prostat QID

## 2011-06-29 NOTE — Progress Notes (Signed)
eLink Physician-Brief Progress Note Patient Name: Jacqueline Christian DOB: May 04, 1946 MRN: [id number]  Date of Service  06/29/2011   HPI/Events of Note  Resintubated following brief time extubated   eICU Interventions  Vent orders and ABG orders re-written   Intervention Category Intermediate Interventions: Respiratory distress - evaluation and management  DETERDING,ELIZABETH 06/29/2011, 12:27 AM

## 2011-06-30 ENCOUNTER — Inpatient Hospital Stay (HOSPITAL_COMMUNITY): Payer: Medicare Other

## 2011-06-30 DIAGNOSIS — Z9911 Dependence on respirator [ventilator] status: Secondary | ICD-10-CM

## 2011-06-30 DIAGNOSIS — J9600 Acute respiratory failure, unspecified whether with hypoxia or hypercapnia: Secondary | ICD-10-CM

## 2011-06-30 DIAGNOSIS — I619 Nontraumatic intracerebral hemorrhage, unspecified: Secondary | ICD-10-CM

## 2011-06-30 LAB — BASIC METABOLIC PANEL
Chloride: 107 mEq/L (ref 96–112)
Creatinine, Ser: 0.5 mg/dL (ref 0.50–1.10)
GFR calc Af Amer: >90 mL/min (ref >90)
Potassium: 3.1 mEq/L — ABNORMAL LOW (ref 3.5–5.1)
Sodium: 142 mEq/L (ref 135–145)

## 2011-06-30 LAB — GLUCOSE, CAPILLARY: Glucose-Capillary: 100 mg/dL — ABNORMAL HIGH (ref 70–99)

## 2011-06-30 LAB — CBC
HCT: 29.7 % — ABNORMAL LOW (ref 36.0–46.0)
Hemoglobin: 10 g/dL — ABNORMAL LOW (ref 12.0–15.0)
RBC: 3.31 MIL/uL — ABNORMAL LOW (ref 3.87–5.11)
RDW: 13.7 % (ref 11.5–15.5)
WBC: 14.9 10*3/uL — ABNORMAL HIGH (ref 4.0–10.5)

## 2011-06-30 LAB — PROTIME-INR
INR: 1.14 (ref 0.00–1.49)
Prothrombin Time: 14.8 seconds (ref 11.6–15.2)

## 2011-06-30 MED ORDER — POTASSIUM CHLORIDE 20 MEQ/15ML (10%) PO LIQD
40.0000 meq | Freq: Once | ORAL | Status: AC
  Administered 2011-06-30: 40 meq
  Filled 2011-06-30 (×2): qty 30

## 2011-06-30 MED ORDER — ACETAMINOPHEN 160 MG/5ML PO SOLN: 650.0000 mg | ORAL | Status: DC | PRN

## 2011-06-30 NOTE — Progress Notes (Signed)
PCCM PROGRESS NOTE  HPI:  66 y/o woman with a Hx of HTN admitted with dizziness and visual disturbance. 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 Subsequently Dr. Venetia Maxon (Neurosurgery) performed microdissection/debulking of hemangioma on 1/145/13 at 16:00 pm. At 21:00 the patient became unresponsive and a repeat imaging of her head demonstrated a postoperative subdural hematoma that required an emergent hematoma decompression. Patient tolerated second procedure well. PCCM was consulted for a continued ventilator management.  Antibiotics:   Cefazolin (post surgical prophylaxis only) 1/15 >> stopped Ceftriaxone 1/21 >>   Cultures/Sepsis Markers:   Resp 1/21 >> NOF  Access/Protocols:  Left radial Aline 1/15>> 1/20 ETT (7.5 mm) 1/15 >> 1/21 (failed due to stridor and secretions) ETT 1/21 >>  Best Practice: DVT: Coagulopathic, SCDs GI: Protonix  SUBJ: Comfortable on vent. RASS -1. + F/C  Physical Exam: Filed Vitals:   06/30/11 1606  BP: 127/58  Pulse: 78  Temp:   Resp: 18    Intake/Output Summary (Last 24 hours) at 06/30/11 1742 Last data filed at 06/30/11 1600  Gross per 24 hour  Intake 1701.67 ml  Output   2295 ml  Net -593.33 ml   Vent Mode:  [-] CPAP FiO2 (%):  [29.7 %-30.5 %] 30 % Set Rate:  [14 bmp] 14 bmp Vt Set:  [420 mL] 420 mL PEEP:  [5 cmH20] 5 cmH20 Pressure Support:  [5 cmH20-8 cmH20] 8 cmH20 Plateau Pressure:  [13 cmH20-15 cmH20] 15 cmH20  General: Intubated, NAD Neuro:  RASS -1. + F/C. L hemiparesis   HEENT: Scalp wound clean and dry, pupils equal, round reactive to light, ETT in place. Cardiovascular: Sinus rhythm, no murmur Lungs: Scattered rhonchi. No wheezes Abdomen: BS+, soft, nontender, no hsm . Tf infusing Skin: no  rashes  Labs:  Lab 06/30/11 0407 06/29/11 0525 06/28/11 0400  NA 142 141 139  K 3.1* 3.6 3.4*  CL 107 108 105  CO2 24 24 26   BUN 20 24* 28*  CREATININE 0.50 0.51 0.55  GLUCOSE 124* 133* 120*    Lab 06/30/11 0407 06/29/11 0525 06/28/11 0400  HGB 10.0* 10.3* 10.8*  HCT 29.7* 30.3* 32.2*  WBC 14.9* 21.2* 19.5*  PLT 172 158 131*     Lab 06/28/11 0400  MG 2.2   Lab Results  Component Value Date   CALCIUM 8.3* 06/30/2011   PHOS 2.7 06/28/2011    CXR: Improved LLL aeration  Assessment & Plan: 66 y/o female s/p resection of a meningioma who developed AMS due to an acute subdural and intracranial hemorrhage. Now s/p craniotomy and hematoma evacuation.   NEUROLOGIC:  - Mgmt per Dr Venetia Maxon. Cognition seems improved today. Keep sedation as light as possible  PULMONARY:  Failed extubation 1/21 due to stridor and secretions. Tolerates PSV mode. Will treat presumed PNA for 48-72 hrs and reassess. Needs to pass SBT AND cuff leak test prior to next extubation trial - will begin solumedrol and try extubation 1/24 AM. If fails this attempt will need tracheostomy tube. Discussed possibility of trach with husband and son   CARDIOVASCULAR: No active issues presently  RENAL: Hypokalemia - Normal renal function.  - Replace K - Monitor UO (Foley cath to gravity).   GASTROINTESTINAL:  -Advance TFs  HEMATOLOGIC:  Thrombocytopenia resolved -No heparin products  -SCD's for DVT prophylaxis   INFECTIOUS:  Cont ceftrx X 7 days  ENDOCRINE:  No Hx of DM.  -off Decadron therapy  -Cont ICU Hyperglycemia protocol for now until TFs @ goal -TFs started 1/22, advanced 7/23  Family updated @ bedside 35 min CCM time  Billy Fischer, MD;  PCCM service; Mobile [phone]  06/30/2011 5:42 PM

## 2011-06-30 NOTE — Progress Notes (Signed)
Subjective: Patient reports on vent, off sedation  Objective: Vital signs in last 24 hours: Temp:  [98 F (36.7 C)-99 F (37.2 C)] 98.5 F (36.9 C) (01/23 0400) Pulse Rate:  [55-88] 71  (01/23 0803) Resp:  [16-23] 16  (01/23 0803) BP: (111-133)/(47-74) 121/66 mmHg (01/23 0803) SpO2:  [96 %-100 %] 97 % (01/23 0803) FiO2 (%):  [29.8 %-30.5 %] 30 % (01/23 0803) Weight:  [78.5 kg (173 lb 1 oz)] 78.5 kg (173 lb 1 oz) (01/23 0500)  Intake/Output from previous day: 01/22 0701 - 01/23 0700 In: 1540 [I.V.:1150; NG/GT:340; IV Piggyback:50] Out: 1320 [Urine:1320] Intake/Output this shift: Total I/O In: 70 [I.V.:50; NG/GT:20] Out: 225 [Urine:225]  Physical Exam: Lethargic, but arouses.  Nods to questions, follows commands.  Moves all extremities to command with persistent left hemiparesis.  Lab Results:  [facility] 06/30/11 0407 06/29/11 0525  WBC 14.9* 21.2*  HGB 10.0* 10.3*  HCT 29.7* 30.3*  PLT 172 158   BMET  Basename 06/30/11 0407 06/29/11 0525  NA 142 141  K 3.1* 3.6  CL 107 108  CO2 24 24  GLUCOSE 124* 133*  BUN 20 24*  CREATININE 0.50 0.51  CALCIUM 8.3* 8.6    Studies/Results: Dg Chest Port 1 View  06/30/2011  *RADIOLOGY REPORT*  Clinical Data: Respiratory failure  PORTABLE CHEST - 1 VIEW  Comparison: 06/29/2011  Findings: Mild bibasilar airspace disease is unchanged.  This may be atelectasis or pneumonia.  Endotracheal tube is 3 cm above the carina.  Right jugular catheter tip in the SVC.  NG tube in place.  IMPRESSION: Endotracheal tube remains in satisfactory position.  Mild bibasilar airspace disease, unchanged.  Original Report Authenticated By: Camelia Phenes, M.D.   Dg Chest Port 1 View  06/29/2011  *RADIOLOGY REPORT*  Clinical Data: Respiratory failure  PORTABLE CHEST - 1 VIEW  Comparison: June 28, 2011  Findings: The ET tube tip is in good position, above the carina. Two right central line tips are at the cavoatrial junction.  No pneumothorax.  There is a  shallow inspiration which accentuates lung markings; however, pulmonary vasculature is within normal limits.  There is mild left basilar atelectasis.  IMPRESSION: Mild left basilar atelectasis, unchanged.  Original Report Authenticated By: Brandon Melnick, M.D.   Dg Chest Port 1 View  06/28/2011  *RADIOLOGY REPORT*  Clinical Data: Status post reintubation.  PORTABLE CHEST - 1 VIEW  Comparison: Portable chest 06/28/2011 at 6:48 a.m.  Findings: Endotracheal tube is identified with tip in good position 3 cm above the carina.  Right IJ catheter is again noted. There is some basilar airspace opacity, greater on the left.  No pneumothorax.  Heart size normal.  IMPRESSION:  1.  ET tube in good position. 2.  Patchy left basilar airspace disease is likely due to atelectasis.  Original Report Authenticated By: Bernadene Bell. Maricela Curet, M.D.    Assessment/Plan: Continue ventilator/pulmonary care per CCM.  To decide tomorrow about extubation.    LOS: 8 days    Dorian Heckle, MD 06/30/2011, 8:32 AM

## 2011-06-30 NOTE — Evaluation (Signed)
Physical Therapy Evaluation Patient Details Name: Jacqueline Christian MRN: [id number] DOB: 1945-08-10 Today's Date: 06/30/2011  Problem List: There is no problem list on file for this patient.   Past Medical History:  Past Medical History  Diagnosis Date  . Recurrent sinus infections     just finished erythromycin dosing 06/18/11  . Hypertension   . Bladder spasms     takes oxybutinin  . Headache   . Dizziness   . Vision changes     r/t brain tumor  . Arthritis   . Depression     situational  . Neoplasm of brain    Past Surgical History:  Past Surgical History  Procedure Date  . Total knee arthroplasty     R  . Abdominal hysterectomy   . Cystectomy     L wrist  . Stapedes surgery     repalced stapes  . Eye surgery 1977    retina repair  . Appendectomy   . Craniotomy 06/22/2011    Procedure: CRANIOTOMY TUMOR EXCISION;  Surgeon: Dorian Heckle, MD;  Location: [facility];  Service: Neurosurgery;  Laterality: Right;  RIGHT pterional Craniotomy for meningioma  . Craniotomy 06/22/2011    Procedure: CRANIOTOMY HEMATOMA EVACUATION SUBDURAL;  Surgeon: Dorian Heckle, MD;  Location: [facility];  Service: Neurosurgery;  Laterality: Right;    PT Assessment/Plan/Recommendation PT Assessment Clinical Impression Statement: 66 y/o woman with a Hx of HTN admitted with dizziness and visual disturbance. 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 Subsequently Dr. Venetia Maxon (Neurosurgery) performed microdissection/debulking of hemangioma. Pt later became unresponsive, a postoperative subdural hematoma was discovered that required hematoma decompression. Pt continues to be ventilated and presents to therapy with Lt. sided deficits, extent of severity unknown. Pt is able to withdraw foot from painful stimulus of Lt. toes (including hip and knee flexion) however is unable to perform on command. Pt has difficulty  keeping eyes open and following commands however tolerated chair position in bed well. Suspect pt will be able to tolerate sitting EOB next session.   PT Recommendation/Assessment: Patient will need skilled PT in the acute care venue PT Problem List: Decreased strength;Decreased range of motion;Decreased activity tolerance;Decreased balance;Decreased mobility;Decreased cognition;Decreased coordination;Decreased knowledge of use of DME;Cardiopulmonary status limiting activity (Decreased balance anticipated) PT Therapy Diagnosis : Generalized weakness;Hemiplegia non-dominant side;Altered mental status (unknown if dominant) PT Plan PT Frequency: Min 3X/week PT Treatment/Interventions: DME instruction;Functional mobility training;Therapeutic exercise;Therapeutic activities;Balance training;Gait training;Cognitive remediation;Patient/family education;Neuromuscular re-education;Wheelchair mobility training PT Recommendation Recommendations for Other Services:  (will likely need rehab consult pending progress) Follow Up Recommendations: Inpatient Rehab;Skilled nursing facility;Supervision/Assistance - 24 hour Equipment Recommended:  (to be determined) PT Goals  Acute Rehab PT Goals PT Goal Formulation: Patient unable to participate in goal setting Time For Goal Achievement: 2 weeks Pt will Roll Supine to Right Side: with mod assist PT Goal: Rolling Supine to Right Side - Progress: Not met Pt will Roll Supine to Left Side: with min assist PT Goal: Rolling Supine to Left Side - Progress: Not met Pt will go Supine/Side to Sit: with mod assist PT Goal: Supine/Side to Sit - Progress: Not met Pt will Sit at [facility] of Bed: with min assist;3-5 min;with bilateral upper extremity support PT Goal: Sit at Edge Of Bed - Progress: Not met Pt will go Sit to Supine/Side: with mod assist;with HOB 0 degrees PT Goal: Sit to Supine/Side - Progress: Not met Pt will go Sit  to Stand: with max assist;from elevated  surface PT Goal: Sit to Stand - Progress: Not met Pt will go Stand to Sit: with mod assist PT Goal: Stand to Sit - Progress: Not met Pt will Transfer Bed to Chair/Chair to Bed: with mod assist PT Transfer Goal: Bed to Chair/Chair to Bed - Progress: Not met  PT Evaluation Precautions/Restrictions  Precautions Precautions: Fall Precaution Comments: Pt currently on the vent in wean mode.  Required Braces or Orthoses: No Restrictions Weight Bearing Restrictions: No Prior Functioning  Home Living Lives With:  (Unknown at this time no family present during eval) Prior Function Level of Independence: Other (comment) (unknow at this time no family present) Cognition Cognition Arousal/Alertness: Lethargic (aroused with name call and though eyes closed participating) Overall Cognitive Status: Difficult to assess Difficult to assess due to: intubated;level of arousal Orientation Level: Other (Comment) (difficult to assess, responding to name call) Following Commands: Follows one step commands consistently (pt require command repeated but completed simple task) Cognition - Other Comments: pt required repetition of commands question due to delayed progessing / ability to hear therapist / cognitive impairment. Pt completed simple commands 2 out 2 trials for all. Sensation/Coordination Sensation Light Touch: Appears Intact Additional Comments: Pt withdraws from painful stimulus to Lt. toes with hip and knee flexion.  Coordination Gross Motor Movements are Fluid and Coordinated: Not tested Fine Motor Movements are Fluid and Coordinated: Not tested Extremity Assessment RUE Assessment RUE Assessment: Within Functional Limits LUE Assessment LUE Assessment: Exceptions to [facility] LUE Strength Left Shoulder Flexion: 2-/5 RLE Assessment RLE Assessment: Exceptions to [facility] RLE AROM (degrees) Overall AROM Right Lower Extremity: Deficits RLE Overall AROM Comments: Full dorsiflexion and knee  flexion/extension. Unable to fully assess hip mobility.  RLE Strength RLE Overall Strength Comments: Able to utilize dorsiflexors/plantorflexors actively, able to produce antigravity movement of hip flexor and quads. unable to MMT secondary to cognition/sedation LLE Assessment LLE Assessment: Exceptions to [facility] LLE AROM (degrees) LLE Overall AROM Comments: Pt able to extend hip x1, able to mobilize toes. Pt has passive dorsiflexion to 90 degrees currently however is high risk for ankle plantarflexion contracture.  LLE Strength LLE Overall Strength: Deficits LLE Overall Strength Comments: Able to "wiggle toes." No active dorsiflexion noted. Active plantar flexion available but pt with difficulty with motor programing and performing on cue. Pt actively flexed hip and knee with withdrawl from painful toe stimulus however was unable to perform with verbal cues.  Mobility (including Balance) Bed Mobility Bed Mobility: Yes Supine to Sit: 1: +1 Total assist Supine to Sit Details (indicate cue type and reason):   Pt placed in chair position in bed with HOB reduced to 45 degrees due to patients foward flexed body position Pt = 0% contribution. Pt tolerated sitting position well and was able to control head when given extended time and ventilator unweighted by placing rolled towel on chest to unweight from front of mouth. Suspect she will tolerate supine to sitting EOB next session.   Scooting to [facility]: 1: +2 Total assist;Patient percentage (comment) Scooting to [facility] Details (indicate cue type and reason): Pt able to maintain Rt. UE crossed over body but not Lt. Able to lift head.  Transfers Transfers: Yes Ambulation/Gait Ambulation/Gait: No  Balance Balance Assessed: No Exercise  General Exercises - Lower Extremity Ankle Circles/Pumps: AROM;AAROM;Both;Other reps (comment);Supine (2 set of 15) Quad Sets: AROM;Right;PROM;Left;15 reps;Supine (active Rt, passive Lt.) Heel Slides: AAROM;PROM;Both;15 reps  (heels unweighted, AAROM Rt, passive Lt.) Low Level/ICU Exercises Stabilized Bridging: AROM;AAROM;Both;Supine (  pt only able to tolerate 1 rep) End of Session PT - End of Session Activity Tolerance: Patient limited by fatigue Patient left: in bed;with call bell in reach (almost chair position) General Behavior During Session: Lethargic (Pt needed to be blocked from reaching for vent tubing. ) Cognitive Impairment: Unable to fully assess.   Sherie Don 06/30/2011, 2:05 PM  Sherie Don) Carleene Mains PT, DPT Acute Rehabilitation [phone]

## 2011-06-30 NOTE — Evaluation (Addendum)
Occupational Therapy Evaluation Patient Details Name: Jacqueline Christian MRN: [id number] DOB: 1946/02/08 Today's Date: 06/30/2011  Problem List: There is no problem list on file for this patient.   Past Medical History:  Past Medical History  Diagnosis Date  . Recurrent sinus infections     just finished erythromycin dosing 06/18/11  . Hypertension   . Bladder spasms     takes oxybutinin  . Headache   . Dizziness   . Vision changes     r/t brain tumor  . Arthritis   . Depression     situational  . Neoplasm of brain    Past Surgical History:  Past Surgical History  Procedure Date  . Total knee arthroplasty     R  . Abdominal hysterectomy   . Cystectomy     L wrist  . Stapedes surgery     repalced stapes  . Eye surgery 1977    retina repair  . Appendectomy   . Craniotomy 06/22/2011    Procedure: CRANIOTOMY TUMOR EXCISION;  Surgeon: Dorian Heckle, MD;  Location: [facility];  Service: Neurosurgery;  Laterality: Right;  RIGHT pterional Craniotomy for meningioma  . Craniotomy 06/22/2011    Procedure: CRANIOTOMY HEMATOMA EVACUATION SUBDURAL;  Surgeon: Dorian Heckle, MD;  Location: [facility];  Service: Neurosurgery;  Laterality: Right;    OT Assessment/Plan/Recommendation OT Assessment Clinical Impression Statement: 66 yo female  Hx of HTN admitted with dizziness and visual disturbance. 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 Subsequently Dr. Venetia Maxon (Neurosurgery) performed microdissection/debulking of hemangioma on 06/21/11 at 16:00 pm. At 21:00 the patient became unresponsive and a repeat imaging of her head demonstrated a postoperative subdural hematoma that required an emergent hematoma decompression. Patient tolerated second procedure well. Pt remains on ventilator s/p surg and currently weaning.  OT Recommendation/Assessment:  Patient will need skilled OT in the acute care venue OT Problem List: Decreased strength;Decreased range of motion;Decreased activity tolerance;Impaired balance (sitting and/or standing);Impaired vision/perception;Decreased coordination;Decreased cognition;Decreased safety awareness;Decreased knowledge of use of DME or AE;Decreased knowledge of precautions;Cardiopulmonary status limiting activity;Impaired UE functional use OT Therapy Diagnosis : Generalized weakness;Cognitive deficits;Disturbance of vision;Hemiplegia non-dominant side OT Plan OT Frequency: Min 2X/week OT Treatment/Interventions: Self-care/ADL training;Therapeutic exercise;Neuromuscular education;DME and/or AE instruction;Cognitive remediation/compensation;Therapeutic activities;Visual/perceptual remediation/compensation;Patient/family education;Balance training OT Recommendation Follow Up Recommendations: Inpatient Rehab (currently not ready for CIR due to vent) Equipment Recommended:  (to be determined) Individuals Consulted Consulted and Agree with Results and Recommendations: Patient unable/family or caregiver not available OT Goals Acute Rehab OT Goals OT Goal Formulation: Patient unable to participate in goal setting Time For Goal Achievement: 2 weeks ADL Goals Pt Will Perform Grooming: with min assist;Supported;Sitting, chair;with cueing (comment type and amount) (mod v/c ) ADL Goal: Grooming - Progress: Goal set today Pt Will Perform Upper Body Bathing: with min assist;Sitting, chair;Supported ADL Goal: Upper Body Bathing - Progress: Goal set today Pt Will Perform Upper Body Dressing: with min assist;Sitting, chair;Supported ADL Goal: Patent attorney - Progress: Goal set today Pt Will Transfer to Toilet: with max assist;Stand pivot transfer;3-in-1 ADL Goal: Toilet Transfer - Progress: Goal set today Miscellaneous OT Goals Miscellaneous OT Goal #1: Pt will tolearate sitting EOB with Mod A as precursor to ADls OT  Goal: Miscellaneous Goal #1 - Progress: Goal set today Miscellaneous OT Goal #2: Pt will demonstrate focused attention ~5 seconds on  ADL tasks to demonstrate increased arousal and precursor to ADL tasks at sink level. OT Goal: Miscellaneous Goal #2 - Progress: Goal set today  OT Evaluation Precautions/Restrictions  Precautions Precautions: Fall Precaution Comments: Pt currently on the vent in wean mode.  Required Braces or Orthoses: No Restrictions Weight Bearing Restrictions: No Prior Functioning Home Living Lives With:  (Unknown at this time no family present during eval) Prior Function Level of Independence: Other (comment) (unknow at this time no family present) ADL ADL Eating/Feeding: NPO Grooming: Performed;Wash/dry face;Minimal assistance Grooming Details (indicate cue type and reason): pt attempting to use Rt Ue to rub eye, Rt ear and feeling tubing. pt provided wash cloth and faciliation at elbow. pt washing BIL eyes with v/c "wash your face" Where Assessed - Grooming: Supine, head of bed up Ambulation Related to ADLs: none ADL Comments: Pt following simple commands: Lt UE two fingers fist and squeeze : Rt UE two fingers,. squeeze , fist. Pt moving Lt Ue with pain stimulus and then tapping fingers. Pt moving Lt UE / LE without commands. Pt provided painful stimulus to Lt LE to produce movement. Pt restless with Rt UE and feeling objects at face level. Pt required total A to hold eye lips open. pt opening eyes briefly for ~5-10 seconds during session several times. Pt unable to maintain eyes open though pt continuing to follow commands Vision/Perception  Vision - History Patient Visual Report: Unable to keep objects in focus (difficult to assess due to aroused attention level) Vision - Assessment Eye Alignment: Within Functional Limits Vision Assessment: Vision not tested Cognition Cognition Arousal/Alertness: Lethargic (aroused with name call and though eyes closed  participating) Overall Cognitive Status: Difficult to assess Difficult to assess due to: intubated;level of arousal Orientation Level: Other (Comment) (difficult to assess, responding to name call) Following Commands: Follows one step commands consistently (pt require command repeated but completed simple task) Cognition - Other Comments: pt required repetition of commands question due to delayed progessing / ability to hear therapist / cognitive impairment. Pt completed simple commands 2 out 2 trials for all. Sensation/Coordination Sensation Light Touch: Appears Intact Additional Comments: Pt withdraws from painful stimulus to Lt. toes with hip and knee flexion.  Coordination Gross Motor Movements are Fluid and Coordinated: Not tested Fine Motor Movements are Fluid and Coordinated: Not tested Extremity Assessment RUE Assessment RUE Assessment: Within Functional Limits LUE Assessment LUE Assessment: Exceptions to [facility] LUE Strength Left Shoulder Flexion: 2-/5 Mobility  Bed Mobility Bed Mobility: Yes Supine to Sit: 1: +1 Total assist Supine to Sit Details (indicate cue type and reason):   Pt placed in chair position in bed with HOB reduced to 45 degrees due to patients foward flexed body position Pt = 0% contribution. Pt tolerated sitting position well and was able to control head when given extended time and ventilator unweighted by placing rolled towel on chest to unweight from front of mouth. Suspect she will tolerate supine to sitting EOB next session.   Scooting to [facility]: 1: +2 Total assist;Patient percentage (comment) Scooting to [facility] Details (indicate cue type and reason): Pt able to maintain Rt. UE crossed over body but not Lt. Able to lift head.  Transfers Transfers: No Exercises General Exercises - Lower Extremity Ankle Circles/Pumps: AROM;AAROM;Both;Other reps (comment);Supine (2 set of 15) Quad Sets: AROM;Right;PROM;Left;15 reps;Supine (active Rt, passive Lt.) Heel Slides:  AAROM;PROM;Both;15 reps (heels unweighted, AAROM Rt, passive Lt.) Low Level/ICU Exercises Stabilized Bridging: AROM;AAROM;Both;Supine (pt only able to tolerate 1 rep) End of Session OT -  End of Session Activity Tolerance: Patient tolerated treatment well Patient left: in bed;with call bell in reach;with restraints reapplied (Rt Engelhard mitten) Nurse Communication: Other (comment) (pt tolerated wean mode and no vent alarms entire session) General Behavior During Session: Lethargic (Pt needed to be blocked from reaching for vent tubing. ) Cognitive Impairment: Unable to fully assess.    Harrel Carina [facility] 06/30/2011, 2:10 PM  Pager: [phone]

## 2011-06-30 NOTE — Plan of Care (Signed)
Problem: Phase II Progression Outcomes Goal: Progress activity as tolerated unless otherwise ordered Outcome: Progressing Tolerated PT / OT evaluation bed level on wean mode of vent

## 2011-06-30 NOTE — Progress Notes (Signed)
eLink Physician-Brief Progress Note Patient Name: TENICIA GURAL DOB: 1945/08/12 MRN: [id number]  Date of Service  06/30/2011   HPI/Events of Note  Hypokalemia   eICU Interventions  Potassium replaced   Intervention Category Intermediate Interventions: Electrolyte abnormality - evaluation and management  DETERDING,ELIZABETH 06/30/2011, 5:04 AM

## 2011-07-01 LAB — GLUCOSE, CAPILLARY
Glucose-Capillary: 113 mg/dL — ABNORMAL HIGH (ref 70–99)
Glucose-Capillary: 122 mg/dL — ABNORMAL HIGH (ref 70–99)
Glucose-Capillary: 125 mg/dL — ABNORMAL HIGH (ref 70–99)
Glucose-Capillary: 133 mg/dL — ABNORMAL HIGH (ref 70–99)
Glucose-Capillary: 139 mg/dL — ABNORMAL HIGH (ref 70–99)

## 2011-07-01 LAB — BASIC METABOLIC PANEL
CO2: 26 mEq/L (ref 19–32)
Creatinine, Ser: 0.53 mg/dL (ref 0.50–1.10)
GFR calc Af Amer: >90 mL/min (ref >90)
Sodium: 138 mEq/L (ref 135–145)

## 2011-07-01 MED ORDER — FENTANYL CITRATE 0.05 MG/ML IJ SOLN: 200.0000 ug | Freq: Once | INTRAMUSCULAR | Status: DC

## 2011-07-01 MED ORDER — ETOMIDATE 2 MG/ML IV SOLN
40.0000 mg | Freq: Once | INTRAVENOUS | Status: DC
  Filled 2011-07-01 (×2): qty 20

## 2011-07-01 MED ORDER — MIDAZOLAM HCL 2 MG/2ML IJ SOLN
5.0000 mg | Freq: Once | INTRAMUSCULAR | Status: DC
  Filled 2011-07-01: qty 6

## 2011-07-01 MED ORDER — KCL IN DEXTROSE-NACL 40-5-0.45 MEQ/L-%-% IV SOLN
INTRAVENOUS | Status: DC
  Administered 2011-07-01: via INTRAVENOUS
  Filled 2011-07-01 (×2): qty 1000

## 2011-07-01 MED ORDER — VECURONIUM BROMIDE 10 MG IV SOLR
10.0000 mg | Freq: Once | INTRAVENOUS | Status: DC
Start: 2011-07-02 — End: 2011-07-02
  Filled 2011-07-01 (×2): qty 10

## 2011-07-01 MED ORDER — MIDAZOLAM HCL 2 MG/2ML IJ SOLN: 5.0000 mg | Freq: Once | INTRAMUSCULAR | Status: DC

## 2011-07-01 MED ORDER — POTASSIUM CHLORIDE 20 MEQ/15ML (10%) PO LIQD
40.0000 meq | Freq: Once | ORAL | Status: AC
  Administered 2011-07-01: 40 meq
  Filled 2011-07-01: qty 30

## 2011-07-01 MED ORDER — WHITE PETROLATUM GEL
Status: AC
  Administered 2011-07-01: 20:00:00
  Filled 2011-07-01: qty 5

## 2011-07-01 MED ORDER — FENTANYL CITRATE 0.05 MG/ML IJ SOLN
200.0000 ug | Freq: Once | INTRAMUSCULAR | Status: DC
  Filled 2011-07-01: qty 4

## 2011-07-01 NOTE — Progress Notes (Signed)
PCCM PROGRESS NOTE  HPI:  66 y/o woman with a Hx of HTN admitted with dizziness and visual disturbance. 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 Subsequently Dr. Venetia Maxon (Neurosurgery) performed microdissection/debulking of hemangioma on 1/145/13 at 16:00 pm. At 21:00 the patient became unresponsive and a repeat imaging of her head demonstrated a postoperative subdural hematoma that required an emergent hematoma decompression. Patient tolerated second procedure well. PCCM was consulted for a continued ventilator management.  Antibiotics:   Cefazolin (post surgical prophylaxis only) 1/15 >> stopped Ceftriaxone 1/21 >>   Cultures/Sepsis Markers:   Resp 1/21 >> NOF  Access/Protocols:  Left radial Aline 1/15>> 1/20 ETT (7.5 mm) 1/15 >> 1/21 (failed due to stridor and secretions) ETT 1/21 >>  Best Practice: DVT: Coagulopathic, SCDs GI: Protonix  SUBJ: Comfortable on vent. RASS -1. + F/C intermittently. Tolerates PS 5-8 cm H2O. Failed cuff leak test  Physical Exam: Filed Vitals:   07/01/11 1000  BP: 149/57  Pulse: 88  Temp:   Resp: 28    Intake/Output Summary (Last 24 hours) at 07/01/11 1049 Last data filed at 07/01/11 1000  Gross per 24 hour  Intake 2091.67 ml  Output   3100 ml  Net -1008.33 ml   Vent Mode:  [-] CPAP FiO2 (%):  [29.7 %-30.3 %] 29.7 % Set Rate:  [14 bmp] 14 bmp Vt Set:  [420 mL] 420 mL PEEP:  [4.7 cmH20-5 cmH20] 4.7 cmH20 Pressure Support:  [8 cmH20] 8 cmH20 Plateau Pressure:  [15 cmH20-17 cmH20] 17 cmH20  General: Intubated, NAD Neuro:  RASS -1. + F/C. L hemiparesis   HEENT: Scalp wound clean and dry, pupils equal, round reactive to light, ETT in place. Cardiovascular: Sinus rhythm, no murmur Lungs: Scattered rhonchi. No wheezes Abdomen:  BS+, soft, nontender, no hsm . Tf infusing Skin: no rashes  Labs:  Lab 07/01/11 0345 06/30/11 0407 06/29/11 0525  NA 138 142 141  K 3.3* 3.1* 3.6  CL 104 107 108  CO2 26 24 24   BUN 14 20 24*  CREATININE 0.53 0.50 0.51  GLUCOSE 145* 124* 133*    Lab 06/30/11 0407 06/29/11 0525 06/28/11 0400  HGB 10.0* 10.3* 10.8*  HCT 29.7* 30.3* 32.2*  WBC 14.9* 21.2* 19.5*  PLT 172 158 131*     Lab 06/28/11 0400  MG 2.2   Lab Results  Component Value Date   CALCIUM 8.8 07/01/2011   PHOS 2.7 06/28/2011    CXR: Improved LLL aeration  Assessment & Plan: 66 y/o female s/p resection of a meningioma who developed AMS due to an acute subdural and intracranial hemorrhage. Now s/p craniotomy and hematoma evacuation.   NEUROLOGIC:  - Mgmt per Dr Venetia Maxon. Keep sedation as light as possible  PULMONARY:  Failed extubation 1/21 due to stridor and secretions. Tolerates PSV mode. Not able to extubate due to UAO and marginal neurological status. Will proceed with trach tube placement 1/25 as she needs an airway more than she needs a ventilator. Have discussed with Dr Venetia Maxon, family and Dr Tyson Alias (who will perform procedure)   CARDIOVASCULAR: No active issues presently  RENAL: Hypokalemia - Normal renal function.  - Replace K  GASTROINTESTINAL:  -Cont TFs  HEMATOLOGIC:  Thrombocytopenia resolved -No heparin products  -SCD's for DVT prophylaxis  -Recheck CBC, coags prior to trach  INFECTIOUS:  Cont ceftrx X 7 days  ENDOCRINE:  No Hx of DM.  -off Decadron therapy  -Cont ICU Hyperglycemia protocol for now until TFs @ goal -TFs started 1/22, advanced 1/23 -Minimal hyperglycemia. Will D/C SSI/CBGs  Family updated @ bedside 35 min CCM time  Billy Fischer, MD;  PCCM service; [place] [phone]  07/01/2011 10:49 AM

## 2011-07-01 NOTE — Progress Notes (Signed)
Subjective: Patient reports (intubated, opens eyes)  Objective: Vital signs in last 24 hours: Temp:  [98.6 F (37 C)-100.8 F (38.2 C)] 100.4 F (38 C) (01/24 0400) Pulse Rate:  [61-89] 85  (01/24 0700) Resp:  [15-31] 21  (01/24 0700) BP: (110-136)/(46-100) 116/56 mmHg (01/24 0700) SpO2:  [94 %-100 %] 99 % (01/24 0700) FiO2 (%):  [29.7 %-30.4 %] 30.1 % (01/24 0700) Weight:  [80.1 kg (176 lb 9.4 oz)] 80.1 kg (176 lb 9.4 oz) (01/24 0500)  Intake/Output from previous day: 01/23 0701 - 01/24 0700 In: 2121.7 [I.V.:631.7; NG/GT:945; IV Piggyback:155] Out: 2950 [Urine:2950] Intake/Output this shift:    Opens eyes to voice; nods to questions. moves extremities R>L.  Vent support continues.  Lab Results:  [facility] 06/30/11 0407 06/29/11 0525  WBC 14.9* 21.2*  HGB 10.0* 10.3*  HCT 29.7* 30.3*  PLT 172 158   BMET  Basename 07/01/11 0345 06/30/11 0407  NA 138 142  K 3.3* 3.1*  CL 104 107  CO2 26 24  GLUCOSE 145* 124*  BUN 14 20  CREATININE 0.53 0.50  CALCIUM 8.8 8.3*    Studies/Results: Dg Chest Port 1 View  06/30/2011  *RADIOLOGY REPORT*  Clinical Data: Respiratory failure  PORTABLE CHEST - 1 VIEW  Comparison: 06/29/2011  Findings: Mild bibasilar airspace disease is unchanged.  This may be atelectasis or pneumonia.  Endotracheal tube is 3 cm above the carina.  Right jugular catheter tip in the SVC.  NG tube in place.  IMPRESSION: Endotracheal tube remains in satisfactory position.  Mild bibasilar airspace disease, unchanged.  Original Report Authenticated By: Camelia Phenes, M.D.    Assessment/Plan: Improved neurologically, continued improvement expected. Weaning of vent per CCM.  LOS: 9 days  Continue support. Thanks to CCM for assistance.   Georgiann Cocker 07/01/2011, 7:45 AM     Hopefully, can extubate today

## 2011-07-01 NOTE — Progress Notes (Signed)
eLink Physician-Brief Progress Note Patient Name: Jacqueline Christian DOB: [birth year] MRN: [id number]  Date of Service  07/01/2011   HPI/Events of Note  hypokalemia   eICU Interventions  Potassium replaced   Intervention Category Minor Interventions: Electrolytes abnormality - evaluation and management  DETERDING,ELIZABETH 07/01/2011, 4:25 AM

## 2011-07-01 NOTE — Progress Notes (Signed)
Physical Therapy Treatment Patient Details Name: Jacqueline Christian MRN: [id number] DOB: November 26, 1945 Today's Date: 07/01/2011  PT Assessment/Plan  PT - Assessment/Plan Comments on Treatment Session: Patient with minimal response with sitting at edge of bed brief moments of eye opening and attention only. Appears more lethargic than notes indicated yesterday.  PT Plan: Discharge plan remains appropriate;Frequency remains appropriate Recommendations for Other Services:  (pending progress) Equipment Recommended: Defer to next venue PT Goals  Acute Rehab PT Goals PT Goal: Supine/Side to Sit - Progress: Not progressing PT Goal: Sit at [facility] Of Bed - Progress: Not progressing PT Goal: Sit to Supine/Side - Progress: Not progressing  PT Treatment Precautions/Restrictions  Precautions Precautions: Fall Precaution Comments: Pt currently on the vent in wean mode.  Required Braces or Orthoses: No Restrictions Weight Bearing Restrictions: No Mobility (including Balance) Bed Mobility Bed Mobility: Yes Supine to Sit: 1: +2 Total assist;Patient percentage (comment) (0%) Supine to Sit Details (indicate cue type and reason): Sat at edge of bed - opens eyes upon sitting and actively participating in exercises with assistance.  Sitting - Scoot to Edge of Bed: 1: +2 Total assist;Patient percentage (comment) (0%) Sit to Supine: 1: +2 Total assist;Patient percentage (comment) (0%) Scooting to HOB: 1: +2 Total assist;Patient percentage (comment) (0%)  Balance Balance Assessed: Yes Static Sitting Balance Static Sitting - Balance Support: Feet supported;No upper extremity supported Static Sitting - Level of Assistance: 1: +1 Total assist Static Sitting - Comment/# of Minutes: Sat at edge of bed secondary to no response to auditory stimuli or exercises in bed. Upon sitting patient with brief focused attention for 1-2 reps of an exercise and then eyes closed. Patient requires constant cues to maintain arousal.  Patient also noted to be sucking on ET tube and feeding tube and then gagging in sitting, so returned to lying. Exercise  General Exercises - Lower Extremity Ankle Circles/Pumps: AAROM;Seated;Both;5 reps;PROM;Supine;10 reps Long Arc Quad: AAROM;Both;5 reps;Seated Heel Slides: PROM;Both;10 reps;Supine Hip ABduction/ADduction: PROM;Both;10 reps;Supine End of Session PT - End of Session Activity Tolerance: Treatment limited secondary to medical complications (Comment) (Limitations of arousal level) Patient left: in bed Nurse Communication: Other (comment) (REsponses at edge of bed and lack of them in bed) General Behavior During Session: Other (comment)  Edwyna Perfect, PT  Pager [phone]  07/01/2011, 3:16 PM

## 2011-07-02 ENCOUNTER — Inpatient Hospital Stay (HOSPITAL_COMMUNITY): Payer: Medicare Other

## 2011-07-02 DIAGNOSIS — J9600 Acute respiratory failure, unspecified whether with hypoxia or hypercapnia: Secondary | ICD-10-CM

## 2011-07-02 DIAGNOSIS — I619 Nontraumatic intracerebral hemorrhage, unspecified: Secondary | ICD-10-CM

## 2011-07-02 DIAGNOSIS — Z9911 Dependence on respirator [ventilator] status: Secondary | ICD-10-CM

## 2011-07-02 DIAGNOSIS — I6200 Nontraumatic subdural hemorrhage, unspecified: Secondary | ICD-10-CM

## 2011-07-02 LAB — GLUCOSE, CAPILLARY
Glucose-Capillary: 102 mg/dL — ABNORMAL HIGH (ref 70–99)
Glucose-Capillary: 124 mg/dL — ABNORMAL HIGH (ref 70–99)
Glucose-Capillary: 137 mg/dL — ABNORMAL HIGH (ref 70–99)

## 2011-07-02 LAB — BASIC METABOLIC PANEL
BUN: 12 mg/dL (ref 6–23)
Creatinine, Ser: 0.53 mg/dL (ref 0.50–1.10)
GFR calc Af Amer: >90 mL/min (ref >90)
GFR calc non Af Amer: >90 mL/min (ref >90)

## 2011-07-02 LAB — MAGNESIUM: Magnesium: 2.1 mg/dL (ref 1.5–2.5)

## 2011-07-02 MED ORDER — FENTANYL CITRATE 0.05 MG/ML IJ SOLN
25.0000 ug | INTRAMUSCULAR | Status: AC | PRN
  Administered 2011-07-02: 50 ug via INTRAVENOUS
  Filled 2011-07-02: qty 2

## 2011-07-02 MED ORDER — ETOMIDATE 2 MG/ML IV SOLN
20.0000 mg | Freq: Once | INTRAVENOUS | Status: AC
  Administered 2011-07-02: 20 mg via INTRAVENOUS

## 2011-07-02 MED ORDER — FENTANYL CITRATE 0.05 MG/ML IJ SOLN
150.0000 ug | Freq: Once | INTRAMUSCULAR | Status: AC
  Administered 2011-07-02: 150 ug via INTRAVENOUS

## 2011-07-02 MED ORDER — SODIUM CHLORIDE 0.9 % IV SOLN
INTRAVENOUS | Status: DC
  Administered 2011-07-02 (×2): via INTRAVENOUS

## 2011-07-02 MED ORDER — MIDAZOLAM HCL 10 MG/2ML IJ SOLN
4.0000 mg | Freq: Once | INTRAMUSCULAR | Status: AC
  Administered 2011-07-02: 4 mg via INTRAVENOUS

## 2011-07-02 MED ORDER — VECURONIUM BROMIDE 10 MG IV SOLR
8.0000 mg | Freq: Once | INTRAVENOUS | Status: AC
  Administered 2011-07-02: 8 mg via INTRAVENOUS

## 2011-07-02 NOTE — Procedures (Signed)
Rush Farmer, M.D. [facility] Pulmonary/Critical Care Medicine. Pager: [phone]. After hours pager: [phone].

## 2011-07-02 NOTE — Procedures (Signed)
Bedside Tracheostomy Insertion Procedure Note   Patient Details:   Name: Jacqueline Christian DOB: [birth year] MRN: [id number]  Procedure: Tracheostomy  Pre Procedure Assessment: ET Tube Size: ET Tube secured at lip (cm): Bite block in place: Yes Breath Sounds: Diminished  Post Procedure Assessment: BP 134/66  Pulse 72  Temp(Src) 98.7 F (37.1 C) (Oral)  Resp 17  Ht 5\' 3"  (1.6 m)  Wt 171 lb 15.3 oz (78 kg)  BMI 30.46 kg/m2  SpO2 100% O2 sats: stable throughout Complications: No apparent complications Patient did tolerate procedure well Tracheostomy Brand:Shiley Tracheostomy Style:Cuffed Tracheostomy Size: 6.0 Tracheostomy Secured JXB:[id number] Tracheostomy Placement Confirmation:Trach cuff visualized and in place    Jennette Kettle 07/02/2011, 12:29 PM

## 2011-07-02 NOTE — Progress Notes (Signed)
Subjective: Patient reports (intubated)  Objective: Vital signs in last 24 hours: Temp:  [98.5 F (36.9 C)-100.8 F (38.2 C)] 99.3 F (37.4 C) (01/25 0324) Pulse Rate:  [63-91] 65  (01/25 0700) Resp:  [15-28] 15  (01/25 0700) BP: (103-149)/(44-64) 118/55 mmHg (01/25 0700) SpO2:  [97 %-100 %] 100 % (01/25 0700) FiO2 (%):  [29.6 %-30.6 %] 30.4 % (01/25 0700) Weight:  [78 kg (171 lb 15.3 oz)] 78 kg (171 lb 15.3 oz) (01/25 0500)  Intake/Output from previous day: 01/24 0701 - 01/25 0700 In: 2038.5 [I.V.:643.5; NG/GT:935; IV Piggyback:260] Out: 2380 [Urine:2380] Intake/Output this shift:    Opens eyes to voice. Follows commands slowly. Short attention span/sleepy. PEARL. Incision without drainage, swelling, or erythema.  Lab Results:  [facility] 06/30/11 0407  WBC 14.9*  HGB 10.0*  HCT 29.7*  PLT 172   BMET  Basename 07/01/11 0345 06/30/11 0407  NA 138 142  K 3.3* 3.1*  CL 104 107  CO2 26 24  GLUCOSE 145* 124*  BUN 14 20  CREATININE 0.53 0.50  CALCIUM 8.8 8.3*    Studies/Results: No results found.  Assessment/Plan: Improved, expecting further improvement.  LOS: 10 days  Continue support. [name] reported to be scheduled for 11am.   Georgiann Cocker 07/02/2011, 7:49 AM     Patient to have tracheostomy today, will be able to begin PT soon

## 2011-07-02 NOTE — Progress Notes (Signed)
Patient ID: Jacqueline Christian, female   DOB: 03-28-1946, 66 y.o.   MRN: [id number]  Now s/p trach placement with vent assist. Restraints in use for airway security. Currently sedated, and not disturbed to allow for rest. Prior to sedation, she was reported to be responsive and following commands/nodding. Plan to continue support, expecting continued improvements. Plan to remove staples from scalp incision on post-op day 14.   Oris Drone Delvina Mizzell, RN, BSN

## 2011-07-02 NOTE — Procedures (Signed)
Perc trach' - see full dication Blood loss less 1 cc Size 6 placed  ALL care for trach forward should be called to Tyson Alias, see below contact  [name]. Tyson Alias, MD, FACP Pgr: [phone] [facility]

## 2011-07-02 NOTE — Procedures (Signed)
Bronchostomy for Percutaneous  Tracheostomy  Name: Jacqueline Christian MRN: [id number] DOB: 1945/11/28 Procedure: Bronchoscopy for Percutaneous Tracheostomy Indications: Diagnostic evaluation of the airways In conjunction with: Dr. Tyson Alias   Procedure Details Consent: Risks of procedure as well as the alternatives and risks of each were explained to the (patient/caregiver).  Consent for procedure obtained. Time Out: Verified patient identification, verified procedure, site/side was marked, verified correct patient position, special equipment/implants available, medications/allergies/relevent history reviewed, required imaging and test results available.  Performed  In preparation for procedure, patient was given 100% FiO2 and bronchoscope lubricated. Sedation: Benzodiazepines, Muscle relaxants and Etomidate , fentanyl.   Airway entered and the following bronchi were examined: Bronchi.   Procedures performed: Endotracheal Tube retracted in 2 cm increments. Cannulation of airway observed. Dilation observed. Placement of trachel tube  observed . No overt complications. Bronchoscope removed.    Evaluation Hemodynamic Status: BP stable throughout; O2 sats: stable throughout Patient's Current Condition: stable Specimens:  None Complications: No apparent complications Patient did tolerate procedure well.   Brett Canales Minor ACNP [facility] PCCM  07/02/2011, 11:59 AM   Procedure supervised by me  Oretha Milch.

## 2011-07-02 NOTE — Progress Notes (Signed)
RT placed patient back on full support to rest tonight. PRVC /420/17/5.0/30%. Patient is tolerating setting well. RT will continue to monitor.

## 2011-07-02 NOTE — Op Note (Signed)
NAME:  Jacqueline Christian, Jacqueline Christian                ACCOUNT NO.:  [id number]  MEDICAL RECORD NO.:  [id number]  LOCATION:  3110                         FACILITY:  [facility]  PHYSICIAN:  Nelda Bucks, MD DATE OF BIRTH:  [birth year]  DATE OF PROCEDURE: DATE OF DISCHARGE:                              OPERATIVE REPORT   PREOPERATIVE DIAGNOSIS:  Status post meningioma resection with vent- dependent respiratory failure and reintubation status secondary to concerns of upper airway.  POSTOPERATIVE DIAGNOSIS:  Status post tracheostomy secondary to failed extubation.  Consent was obtained from the patient's family members, who are aware of risks and benefits of the procedure including infection, bleeding, pneumothorax, and death.  The patient was kept n.p.o.  Coagulation panel was assessed and platelet counts were all within normal limits preoperatively.  Bronchoscopist for the procedure was Dr. Cyril Mourning with first assistant, Devra Dopp, ACNP.  The patient was placed in the supine position.  Chlorhexidine preparation used to sterilize the operative site.  Bronchoscopist placed a bronchoscope through the endotracheal tube and backed up to approximately 17 cm.  I injected approximately 7 mL of lidocaine with epinephrine, 1% lidocaine over the operative site.  A 1.1-cm vertical incision was made, and dissection was made down to the strap muscles which were separated.  I then assessed the tracheal plane, there was no anterior abdominal veins or vascular markings.  I then placed an 18- gauge needle over a white catheter sheath into the airway successfully which was noted by the bronchoscopist without any posterior wall injury. The white catheter sheath remained and needle was removed.  A wire was placed through white catheter sheath and white catheter sheath was removed.  A 14-French punch dilator was placed over the wire successfully into the airway and removed.  I then placed a progressive Rhino  dilator over a glider was passed into the airway to approximately 30-French.  Then the dilator was removed and the glider remained.  I then placed a size 6 tracheostomy over 26-French dilator over the Glidewire and successfully into the airway.  Everything was removed except for the tracheostomy and the tracheostomy balloon was inflated with approximately 8 mL of air.  The bronchoscopist then placed the bronchoscope through the new tracheostomy to the carina approximately 3.5 cm below without any posterior wall injury, any trauma, or any active bleeding.  Postoperative chest x-ray revealed a well placed tracheostomy without any apparent pneumothorax.  Blood loss for the procedure was less than 1 mL.  Hemodynamics and saturations throughout the procedure were all within normal limits.  I updated the family members in the waiting room postoperatively and the patient tolerated the procedure quite well.     Nelda Bucks, MD    DJF/MEDQ  D:  07/02/2011  T:  07/02/2011  Job:  [id number]

## 2011-07-02 NOTE — Progress Notes (Signed)
PCCM PROGRESS NOTE  HPI:  66 y/o woman with a Hx of HTN admitted with dizziness and visual disturbance. 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 Subsequently Dr. Venetia Maxon (Neurosurgery) performed microdissection/debulking of hemangioma on 1/145/13 at 16:00 pm. At 21:00 the patient became unresponsive and a repeat imaging of her head demonstrated a postoperative subdural hematoma that required an emergent hematoma decompression. Patient tolerated second procedure well. PCCM was consulted for a continued ventilator management.  Antibiotics:   Cefazolin (post surgical prophylaxis only) 1/15 >> stopped Ceftriaxone 1/21 >>   Cultures/Sepsis Markers:   Resp 1/21 >> NOF  Access/Protocols:  Left radial Aline 1/15>> 1/20 ETT (7.5 mm) 1/15 >> 1/21 (failed due to stridor and secretions) ETT 1/21 >>  Best Practice: DVT: Coagulopathic, SCDs GI: Protonix  SUBJ: failed cuff leak, family updated  Physical Exam: Filed Vitals:   07/02/11 1000  BP: 112/56  Pulse: 80  Temp:   Resp: 16    Intake/Output Summary (Last 24 hours) at 07/02/11 1014 Last data filed at 07/02/11 1000  Gross per 24 hour  Intake 1973.5 ml  Output   2480 ml  Net -506.5 ml   Vent Mode:  [-] PRVC FiO2 (%):  [29.6 %-30.6 %] 30.3 % Set Rate:  [14 bmp] 14 bmp Vt Set:  [420 mL] 420 mL PEEP:  [4.4 cmH20-5 cmH20] 5 cmH20 Pressure Support:  [8 cmH20] 8 cmH20 Plateau Pressure:  [7 cmH20-16 cmH20] 7 cmH20  General: Intubated Neuro:  RASS -1 to -2. + F/C. L hemiparesis   HEENT: Scalp wound clean and dry, pupils equal, round reactive to light, ETT in place. Cardiovascular: Sinus rhythm, no murmur Lungs: Scattered rhonchi Abdomen: BS+, soft, nontender, no hsm . Tf infusing Skin: no rashes  Labs:  Lab 07/01/11  0345 06/30/11 0407 06/29/11 0525  NA 138 142 141  K 3.3* 3.1* 3.6  CL 104 107 108  CO2 26 24 24   BUN 14 20 24*  CREATININE 0.53 0.50 0.51  GLUCOSE 145* 124* 133*    Lab 06/30/11 0407 06/29/11 0525 06/28/11 0400  HGB 10.0* 10.3* 10.8*  HCT 29.7* 30.3* 32.2*  WBC 14.9* 21.2* 19.5*  PLT 172 158 131*     Lab 06/28/11 0400  MG 2.2   Lab Results  Component Value Date   CALCIUM 8.8 07/01/2011   PHOS 2.7 06/28/2011    CXR: Improved LLL aeration  Assessment & Plan: 66 y/o female s/p resection of a meningioma who developed AMS due to an acute subdural and intracranial hemorrhage. Now s/p craniotomy and hematoma evacuation.   NEUROLOGIC:  - Mgmt per Dr Venetia Maxon. Keep sedation as light as possible rass -2 on own Avoid free water and pos balance, na 138, dc Avoid d5 in fluids when able  PULMONARY:  Failed extubation 1/21 due to stridor and secretions. Tolerates PSV mode. Not able to extubate due to UAO and marginal neurological status. Will proceed with trach tube placement 1/25 as she needs an airway more than she needs a ventilator. - abg last reviewed, likely can reduce minute ventilation  -wean ok this am cpap 5 ps 5 goal Post trach will consider cpap ps prior to any TRach collar trials pcxr in afternoon planned  CARDIOVASCULAR: No active issues presently  RENAL: Hypokalemia - Normal renal function.  - Replace K 1/24, repeat now and in am  checm  Mag as well Back to NS, na 138, at risk SIADH   GASTROINTESTINAL:  -TF held, npo trach ppi  HEMATOLOGIC:  Thrombocytopenia resolved and continues to improve -No heparin products  -SCD's for DVT prophylaxis  -Recheck CBC, coags prior to trach wnl  INFECTIOUS:  Cont ceftrx X 7 days  ENDOCRINE:  No Hx of DM.  -off Decadron therapy  -Cont ICU Hyperglycemia protocol for now until TFs @ goal -TFs started 1/22, advanced 1/23 -Minimal hyperglycemia. Dc d5 in fluids and change back to NS  Family updated @ bedside, husband  updated 30 min   [name]. Tyson Alias, MD, FACP Pgr: [phone] [place] [facility]   07/02/2011 10:14 AM

## 2011-07-03 ENCOUNTER — Inpatient Hospital Stay (HOSPITAL_COMMUNITY): Payer: Medicare Other

## 2011-07-03 DIAGNOSIS — I10 Essential (primary) hypertension: Secondary | ICD-10-CM

## 2011-07-03 LAB — BASIC METABOLIC PANEL
CO2: 28 mEq/L (ref 19–32)
Chloride: 105 mEq/L (ref 96–112)
Glucose, Bld: 135 mg/dL — ABNORMAL HIGH (ref 70–99)
Potassium: 3.2 mEq/L — ABNORMAL LOW (ref 3.5–5.1)
Sodium: 140 mEq/L (ref 135–145)

## 2011-07-03 LAB — CBC
HCT: 29.5 % — ABNORMAL LOW (ref 36.0–46.0)
Hemoglobin: 9.8 g/dL — ABNORMAL LOW (ref 12.0–15.0)
MCV: 89.4 fL (ref 78.0–100.0)
Platelets: 166 10*3/uL (ref 150–400)
RBC: 3.3 MIL/uL — ABNORMAL LOW (ref 3.87–5.11)
WBC: 13.1 10*3/uL — ABNORMAL HIGH (ref 4.0–10.5)

## 2011-07-03 LAB — DIFFERENTIAL
Eosinophils Relative: 3 % (ref 0–5)
Lymphocytes Relative: 10 % — ABNORMAL LOW (ref 12–46)
Lymphs Abs: 1.4 10*3/uL (ref 0.7–4.0)
Monocytes Absolute: 0.9 10*3/uL (ref 0.1–1.0)
Monocytes Relative: 7 % (ref 3–12)

## 2011-07-03 LAB — PHOSPHORUS: Phosphorus: 3.4 mg/dL (ref 2.3–4.6)

## 2011-07-03 LAB — GLUCOSE, CAPILLARY
Glucose-Capillary: 129 mg/dL — ABNORMAL HIGH (ref 70–99)
Glucose-Capillary: 142 mg/dL — ABNORMAL HIGH (ref 70–99)
Glucose-Capillary: 127 mg/dL — ABNORMAL HIGH (ref 70–99)
Glucose-Capillary: 118 mg/dL — ABNORMAL HIGH (ref 70–99)

## 2011-07-03 MED ORDER — LEVETIRACETAM 100 MG/ML PO SOLN
500.0000 mg | Freq: Two times a day (BID) | ORAL | Status: DC
  Administered 2011-07-03 – 2011-07-07 (×8): 500 mg
  Filled 2011-07-03 (×10): qty 5

## 2011-07-03 MED ORDER — POTASSIUM CHLORIDE 20 MEQ/15ML (10%) PO LIQD
40.0000 meq | Freq: Once | ORAL | Status: AC
  Administered 2011-07-03: 40 meq
  Filled 2011-07-03: qty 30

## 2011-07-03 MED ORDER — POLYETHYLENE GLYCOL 3350 17 G PO PACK
17.0000 g | PACK | Freq: Every day | ORAL | Status: DC | PRN
  Administered 2011-07-03: 17 g via ORAL
  Filled 2011-07-03 (×2): qty 1

## 2011-07-03 NOTE — Progress Notes (Signed)
Patient ID: Jacqueline Christian, female   DOB: 22-Apr-1946, 66 y.o.   MRN: [id number] Subjective: Patient reports Doing ok per patient  Objective: Vital signs in last 24 hours: Temp:  [99.5 F (37.5 C)-101.4 F (38.6 C)] 100 F (37.8 C) (01/26 0700) Pulse Rate:  [59-95] 83  (01/26 0852) Resp:  [15-23] 21  (01/26 0852) BP: (87-150)/(38-82) 87/49 mmHg (01/26 0852) SpO2:  [97 %-100 %] 97 % (01/26 0852) FiO2 (%):  [28 %-100 %] 28 % (01/26 0852)  Intake/Output from previous day: 01/25 0701 - 01/26 0700 In: 2270 [I.V.:1125; NG/GT:825; IV Piggyback:260] Out: 1775 [Urine:1775] Intake/Output this shift: Total I/O In: 105 [I.V.:50; NG/GT:55] Out: 165 [Urine:165]  Opens eyes to stim. Follows complex commands.  Lab Results:  [facility] 07/03/11 0423  WBC 13.1*  HGB 9.8*  HCT 29.5*  PLT 166   BMET  Basename 07/03/11 0423 07/02/11 1035  NA 140 138  K 3.2* 3.6  CL 105 102  CO2 28 26  GLUCOSE 135* 125*  BUN 16 12  CREATININE 0.55 0.53  CALCIUM 9.0 9.0    Studies/Results: Dg Chest Port 1 View  07/03/2011  *RADIOLOGY REPORT*  Clinical Data: Tracheostomy tube.  Recent meningioma resection.  PORTABLE CHEST - 1 VIEW  Comparison: 07/02/2011  Findings: Tracheostomy tube projects over the tracheal air column, with tip 4 cm above the carina. A feeding tube extends into the stomach and beyond the inferior margin of today's image.  Right internal jugular central venous catheter tip projects over the superior vena cava.  Continued band like atelectasis noted in the right mid lung and left lung base.  Overall radiographic appearance of the chest is stable.  IMPRESSION:  1.  Stable atelectasis in the left lower lobe and right midlung.  A feeding tube has been placed; the support apparatus is otherwise stable.  Original Report Authenticated By: Dellia Cloud, M.D.   Dg Chest Port 1 View  07/02/2011  *RADIOLOGY REPORT*  Clinical Data: Post tracheostomy placement  PORTABLE CHEST - 1 VIEW   Comparison: 06/30/2011  Findings: Tracheostomy tube tip is midline 4 cm above the carina. Right central line tip proximal superior vena cava level.  No gross pneumothorax.  Poor inspiration.  Consolidation left base and right infrahilar region may represent atelectasis or infiltrate.  Central pulmonary vascular congestion.  Heart size top normal.  Gas filled stomach.  IMPRESSION: Tracheostomy tube tip midline 4 cm above the carina.  Consolidation left base and right infrahilar region may represent atelectasis or infiltrate.  Please see above.  Original Report Authenticated By: Fuller Canada, M.D.   Dg Abd Portable 1v  07/02/2011  *RADIOLOGY REPORT*  Clinical Data: Evaluate feeding tube placement  PORTABLE ABDOMEN - 1 VIEW  Comparison: None.  Findings: Feeding tube tip is coiled within the stomach and directed proximally.  There is gaseous distention of the large and small bowel loops of the level of the rectum.  IMPRESSION:  1.  Feeding tube tip is coiled within the stomach with tip oriented proximally.  Original Report Authenticated By: Rosealee Albee, M.D.    Assessment/Plan: Stable. Will continue present rx   LOS: 11 days  As above   Reinaldo Meeker, MD 07/03/2011, 8:56 AM

## 2011-07-03 NOTE — Progress Notes (Signed)
Patient: Jacqueline Christian MRN: [id number] Date of admission: 06/22/2011 Date of service: 07/03/2011                                                                        PCCM PROGRESS NOTE  HPI:  66 y/o woman with a Hx of HTN admitted with dizziness and visual disturbance. 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 Subsequently Dr. Venetia Maxon (Neurosurgery) performed microdissection/debulking of hemangioma on 06/21/11 at 16:00 pm. At 21:00 the patient became unresponsive and a repeat imaging of her head demonstrated a postoperative subdural hematoma that required an emergent hematoma decompression. Patient tolerated second procedure well. PCCM was consulted for a continued ventilator management.  Antibiotics:   Cefazolin (post surgical prophylaxis only) 1/15 >> stopped Ceftriaxone 1/21 >>   Cultures/Sepsis Markers:   Resp 1/21 >> NOF  Access/Protocols:  Left radial Aline 1/15>> 1/20 ETT (7.5 mm) 1/15 >> 1/21 (failed due to stridor and secretions) ETT 1/21 >>1/25 Perc trach [name] 1/25>>>  Best Practice: DVT: Coagulopathic, SCDs GI: Protonix  Subjective/ Overnight:  No acute issues, tol ATC this am  Physical Exam: Filed Vitals:   07/03/11 0920  BP: 102/66  Pulse: 91  Temp:   Resp: 21    Intake/Output Summary (Last 24 hours) at 07/03/11 1036 Last data filed at 07/03/11 0800  Gross per 24 hour  Intake   2175 ml  Output   1465 ml  Net    710 ml   Vent Mode:  [-] PRVC FiO2 (%):  [28 %-100 %] 28 % Set Rate:  [17 bmp] 17 bmp Vt Set:  [420 mL] 420 mL PEEP:  [5 cmH20-5.1 cmH20] 5 cmH20 Pressure Support:  [8 cmH20] 8 cmH20 Plateau Pressure:  [14 cmH20-19 cmH20] 18 cmH20  Physical Exam: General: chronically ill appearing female, NAD in bed Neuro: drowsy, easily arousable but does not follow commands CV: s1s2 rrr, distant,  NSR PULM: resps even non labored on ATC, few scattered ronchi GI: abd soft, nt, +bs, tol TF Extremities:  Warm and dry, scant BLE edema    Labs:  Lab 07/03/11 0423 07/02/11 1035 07/01/11 0345  NA 140 138 138  K 3.2* 3.6 3.3*  CL 105 102 104  CO2 28 26 26   BUN 16 12 14   CREATININE 0.55 0.53 0.53  GLUCOSE 135* 125* 145*    Lab 07/03/11 0423 06/30/11 0407 06/29/11 0525  HGB 9.8* 10.0* 10.3*  HCT 29.5* 29.7* 30.3*  WBC 13.1* 14.9* 21.2*  PLT 166 172 158    Lab 07/03/11 0423  MG 2.2   Lab Results  Component Value Date   CALCIUM 9.0 07/03/2011   PHOS 3.4 07/03/2011    CXR: Dg Chest Port 1 View  07/03/2011   PORTABLE CHEST - 1 VIEW  Comparison: 07/02/2011   IMPRESSION:  1.  Stable atelectasis in the left lower lobe and right midlung.  A feeding tube has been placed; the support apparatus is otherwise stable.      Assessment & Plan: 66 y/o female s/p resection of a meningioma who developed AMS due to an acute subdural  and intracranial hemorrhage. Now s/p craniotomy and hematoma evacuation.   Acute respiratory failure -- s/p resection of meningioma with acute intracranial hemorrhage s/p craniotomy and hematoma evacuation. Failed extubation 1/21 due to stridor and secretions. Tol ATC wean s/p trach 1/25.  PLAN -  Cont ATC wean as tol pulm hygiene  Cont ceftriaxone total 7 days , can dc 1/27   Meningioma, post op acute intracranial hemorrhage s/p craniotomy and hematoma evacuation.  PLAN -  - Mgmt per Dr Venetia Maxon. Keep sedation as light as possible -Avoid free water and pos balance, na 138, dc -Avoid d5 in fluids   Hypokalemia - Normal renal function.  PLAN -  -replete K, mg/phos ok   Protein calorie malnutrition -  tol TF.  -cont TF as tol ppi   Thrombocytopenia resolved  PLAN -  -No heparin products  -SCD's for DVT prophylaxis  -f/u cbc   Hyperglycemia - No Hx of DM.  PLAN - ICU hyperglycemia protocol TF   WHITEHEART,KATHRYN, NP 07/03/2011  10:36  AM Pager: [phone]  Formulated above plan with NP, OK to dc CVL, OK to transfer to vent wean bed, Expect her to be liberated from vent, progress with rehab remains to be seen  [facility] V.   *Care during the described time interval was provided by me and/or other providers on the critical care team. I have reviewed this patient's available data, including medical history, events of note, physical examination and test results as part of my evaluation.

## 2011-07-04 DIAGNOSIS — J9600 Acute respiratory failure, unspecified whether with hypoxia or hypercapnia: Secondary | ICD-10-CM

## 2011-07-04 DIAGNOSIS — I6200 Nontraumatic subdural hemorrhage, unspecified: Secondary | ICD-10-CM

## 2011-07-04 DIAGNOSIS — Z9911 Dependence on respirator [ventilator] status: Secondary | ICD-10-CM

## 2011-07-04 LAB — CBC
Platelets: 191 10*3/uL (ref 150–400)
RDW: 13.6 % (ref 11.5–15.5)
WBC: 13.1 10*3/uL — ABNORMAL HIGH (ref 4.0–10.5)

## 2011-07-04 LAB — URINALYSIS, ROUTINE W REFLEX MICROSCOPIC
Bilirubin Urine: NEGATIVE
Ketones, ur: NEGATIVE mg/dL
Protein, ur: NEGATIVE mg/dL
Urobilinogen, UA: 0.2 mg/dL (ref 0.0–1.0)

## 2011-07-04 LAB — BASIC METABOLIC PANEL
Calcium: 9.3 mg/dL (ref 8.4–10.5)
Creatinine, Ser: 0.53 mg/dL (ref 0.50–1.10)
GFR calc Af Amer: >90 mL/min (ref >90)
Sodium: 139 mEq/L (ref 135–145)

## 2011-07-04 LAB — GLUCOSE, CAPILLARY
Glucose-Capillary: 127 mg/dL — ABNORMAL HIGH (ref 70–99)
Glucose-Capillary: 134 mg/dL — ABNORMAL HIGH (ref 70–99)

## 2011-07-04 LAB — URINE MICROSCOPIC-ADD ON

## 2011-07-04 MED ORDER — SULFAMETHOXAZOLE-TMP DS 800-160 MG PO TABS
1.0000 | ORAL_TABLET | Freq: Two times a day (BID) | ORAL | Status: DC
  Administered 2011-07-04 – 2011-07-07 (×6): 1 via ORAL
  Filled 2011-07-04 (×8): qty 1

## 2011-07-04 NOTE — Progress Notes (Signed)
Patient: Jacqueline Christian MRN: [id number] Date of admission: 06/22/2011 Date of service: 07/04/2011                                                                        PCCM PROGRESS NOTE  HPI:  66 y/o woman with a Hx of HTN admitted with dizziness and visual disturbance. 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 Subsequently Dr. Venetia Maxon (Neurosurgery) performed microdissection/debulking  on 06/21/11 at 16:00 pm. At 21:00 the patient became unresponsive and a repeat imaging of her head demonstrated a postoperative subdural hematoma that required an emergent hematoma decompression. Patient tolerated second procedure well. PCCM was consulted for a continued ventilator management.  Antibiotics:   Cefazolin (post surgical prophylaxis only) 1/15 >> stopped Ceftriaxone 1/21 >>   Cultures/Sepsis Markers:   Resp 1/21 >>oral flora  Access/Protocols:  Left radial Aline 1/15>> 1/20 ETT (7.5 mm) 1/15 >> 1/21 (failed due to stridor and secretions) ETT 1/21 >>1/25 Perc trach [name] 1/25>>>  Best Practice: DVT: Coagulopathic, SCDs GI: Protonix  Subjective/ Overnight:  No acute issues, tol ATC overnight.  Looks good OOB in chair.   Physical Exam: Filed Vitals:   07/04/11 0843  BP: 117/51  Pulse: 88  Temp:   Resp: 19    Intake/Output Summary (Last 24 hours) at 07/04/11 1016 Last data filed at 07/04/11 4098  Gross per 24 hour  Intake   1605 ml  Output   1970 ml  Net   -365 ml   Vent Mode:  [-]  FiO2 (%):  [28 %] 28 %  Physical Exam: General: chronically ill appearing female, NAD in bed Neuro: drowsy, easily arousable but does not follow commands CV: s1s2 rrr, distant, NSR PULM: resps even non labored on ATC, few scattered ronchi GI: abd soft, nt, +bs, tol TF Extremities:  Warm and dry, scant BLE edema    Labs:  Lab 07/04/11 0500  07/03/11 0423 07/02/11 1035  NA 139 140 138  K 3.7 3.2* 3.6  CL 102 105 102  CO2 30 28 26   BUN 14 16 12   CREATININE 0.53 0.55 0.53  GLUCOSE 129* 135* 125*    Lab 07/04/11 0500 07/03/11 0423 06/30/11 0407  HGB 10.2* 9.8* 10.0*  HCT 31.1* 29.5* 29.7*  WBC 13.1* 13.1* 14.9*  PLT 191 166 172    Lab 07/03/11 0423  MG 2.2   Lab Results  Component Value Date   CALCIUM 9.3 07/04/2011   PHOS 3.4 07/03/2011   CXR - no new cxr 1/27   Assessment & Plan: 66 y/o female s/p resection of a meningioma who developed AMS due to an acute subdural and intracranial hemorrhage. Now s/p craniotomy and hematoma evacuation.   Acute respiratory failure -- s/p resection of meningioma with acute intracranial hemorrhage s/p craniotomy and hematoma evacuation. Failed extubation 1/21 due to stridor and secretions. Tol ATC wean s/p trach 1/25.  On ATC since 1/26.  PLAN -  Cont ATC wean as tol pulm hygiene  D/c ceftriaxone after today's dose  If cont to tol ATC consider swallow eval/ PMV per ST 1/28  Meningioma, post op acute intracranial hemorrhage s/p craniotomy and hematoma evacuation.  PLAN -  - Mgmt per Dr Venetia Maxon. Keep sedation as light as possible -Avoid d5 in fluids  -pt/ot -keppra  Hypokalemia - Normal renal function.  PLAN -  -replete K, mg/phos ok   Protein calorie malnutrition -  tol TF.  -cont TF as tol ppi   Thrombocytopenia resolved  PLAN -  -No heparin products  -SCD's for DVT prophylaxis  -f/u cbc   Hyperglycemia - No Hx of DM.  Resolved. Requiring no insulin coverage PLAN - D/c cbg Monitor glucose on chem  OK with PCCM for her to tx to SDU.  Cont rehab efforts.  ?? D/c to CIR soon.    Danford Bad, NP 07/04/2011  10:16 AM Pager: [phone]  *Care during the described time interval was provided by me and/or other providers on the critical care team. I have reviewed this patient's available data, including medical history, events of note, physical  examination and test results as part of my evaluation.  Ashe Gago V.

## 2011-07-04 NOTE — Progress Notes (Signed)
Patient ID: Jacqueline Christian, female   DOB: 05-16-1946, 66 y.o.   MRN: [id number] Subjective: Patient reports Feeling better  Objective: Vital signs in last 24 hours: Temp:  [98.9 F (37.2 C)-99.6 F (37.6 C)] 98.9 F (37.2 C) (01/27 0437) Pulse Rate:  [68-96] 88  (01/27 0843) Resp:  [17-22] 19  (01/27 0843) BP: (95-127)/(41-66) 117/51 mmHg (01/27 0843) SpO2:  [97 %-100 %] 99 % (01/27 0843) FiO2 (%):  [28 %] 28 % (01/27 0843) Weight:  [81 kg (178 lb 9.2 oz)] 81 kg (178 lb 9.2 oz) (01/27 0600)  Intake/Output from previous day: 01/26 0701 - 01/27 0700 In: 1760 [I.V.:150; NG/GT:1560; IV Piggyback:50] Out: 1825 [Urine:1825] Intake/Output this shift: Total I/O In: 55 [NG/GT:55] Out: 135 [Urine:135]  More awake, alert and responsive  Lab Results:  Basename 07/04/11 0500 07/03/11 0423  WBC 13.1* 13.1*  HGB 10.2* 9.8*  HCT 31.1* 29.5*  PLT 191 166   BMET  Basename 07/04/11 0500 07/03/11 0423  NA 139 140  K 3.7 3.2*  CL 102 105  CO2 30 28  GLUCOSE 129* 135*  BUN 14 16  CREATININE 0.53 0.55  CALCIUM 9.3 9.0    Studies/Results: Dg Chest Port 1 View  07/03/2011  *RADIOLOGY REPORT*  Clinical Data: Tracheostomy tube.  Recent meningioma resection.  PORTABLE CHEST - 1 VIEW  Comparison: 07/02/2011  Findings: Tracheostomy tube projects over the tracheal air column, with tip 4 cm above the carina. A feeding tube extends into the stomach and beyond the inferior margin of today's image.  Right internal jugular central venous catheter tip projects over the superior vena cava.  Continued band like atelectasis noted in the right mid lung and left lung base.  Overall radiographic appearance of the chest is stable.  IMPRESSION:  1.  Stable atelectasis in the left lower lobe and right midlung.  A feeding tube has been placed; the support apparatus is otherwise stable.  Original Report Authenticated By: Dellia Cloud, M.D.   Dg Chest Port 1 View  07/02/2011  *RADIOLOGY REPORT*  Clinical  Data: Post tracheostomy placement  PORTABLE CHEST - 1 VIEW  Comparison: 06/30/2011  Findings: Tracheostomy tube tip is midline 4 cm above the carina. Right central line tip proximal superior vena cava level.  No gross pneumothorax.  Poor inspiration.  Consolidation left base and right infrahilar region may represent atelectasis or infiltrate.  Central pulmonary vascular congestion.  Heart size top normal.  Gas filled stomach.  IMPRESSION: Tracheostomy tube tip midline 4 cm above the carina.  Consolidation left base and right infrahilar region may represent atelectasis or infiltrate.  Please see above.  Original Report Authenticated By: Fuller Canada, M.D.   Dg Abd Portable 1v  07/02/2011  *RADIOLOGY REPORT*  Clinical Data: Evaluate feeding tube placement  PORTABLE ABDOMEN - 1 VIEW  Comparison: None.  Findings: Feeding tube tip is coiled within the stomach and directed proximally.  There is gaseous distention of the large and small bowel loops of the level of the rectum.  IMPRESSION:  1.  Feeding tube tip is coiled within the stomach with tip oriented proximally.  Original Report Authenticated By: Rosealee Albee, M.D.    Assessment/Plan: Doing better. Plan per Dr Venetia Maxon  LOS: 12 days  As above   Reinaldo Meeker, MD 07/04/2011, 9:09 AM

## 2011-07-05 DIAGNOSIS — C700 Malignant neoplasm of cerebral meninges: Secondary | ICD-10-CM

## 2011-07-05 DIAGNOSIS — I619 Nontraumatic intracerebral hemorrhage, unspecified: Secondary | ICD-10-CM

## 2011-07-05 LAB — BASIC METABOLIC PANEL
BUN: 15 mg/dL (ref 6–23)
CO2: 31 mEq/L (ref 19–32)
Calcium: 9.7 mg/dL (ref 8.4–10.5)
Chloride: 100 mEq/L (ref 96–112)
Creatinine, Ser: 0.63 mg/dL (ref 0.50–1.10)
GFR calc Af Amer: >90 mL/min (ref >90)
Glucose, Bld: 123 mg/dL — ABNORMAL HIGH (ref 70–99)
Potassium: 3.7 mEq/L (ref 3.5–5.1)

## 2011-07-05 MED ORDER — PHENYLEPHRINE HCL 2.5 % OP SOLN
1.0000 [drp] | Freq: Once | OPHTHALMIC | Status: DC
  Filled 2011-07-05: qty 2

## 2011-07-05 NOTE — Progress Notes (Signed)
Subjective: Patient reports (resting quietly at present)  Objective: Vital signs in last 24 hours: Temp:  [98 F (36.7 C)-99.5 F (37.5 C)] 99.2 F (37.3 C) (01/28 0817) Pulse Rate:  [67-107] 72  (01/28 0817) Resp:  [16-21] 18  (01/28 0817) BP: (96-123)/(42-97) 117/64 mmHg (01/28 0845) SpO2:  [94 %-100 %] 96 % (01/28 0845) FiO2 (%):  [28 %] 28 % (01/28 0845) Weight:  [78.5 kg (173 lb 1 oz)] 78.5 kg (173 lb 1 oz) (01/28 0500)  Intake/Output from previous day: 01/27 0701 - 01/28 0700 In: 1490 [NG/GT:1440; IV Piggyback:50] Out: 1645 [Urine:1645] Intake/Output this shift:    Responsive, follows commands, sleepy.   Lab Results:  Basename 07/04/11 0500 07/03/11 0423  WBC 13.1* 13.1*  HGB 10.2* 9.8*  HCT 31.1* 29.5*  PLT 191 166   BMET  Basename 07/05/11 0545 07/04/11 0500  NA 141 139  K 3.7 3.7  CL 100 102  CO2 31 30  GLUCOSE 123* 129*  BUN 15 14  CREATININE 0.64 0.53  CALCIUM 9.5 9.3    Studies/Results: No results found.  Assessment/Plan: improving  LOS: 13 days  Rehab   Georgiann Cocker 07/05/2011, 9:22 AM

## 2011-07-05 NOTE — Consult Note (Signed)
Physical Medicine and Rehabilitation Consult Reason for Consult: Impaired cognition and mobility Referring Phsyician: Venetia Maxon    HPI: Jacqueline Christian is an 66 y.o. female with history of dizziness and visual deficits with work up revealing a right  anterior sphenoid wing meningioma which appears to be encasing the right carotid and circulation. There also appears to be encasement of the right optic nerve. This mass measures 20 x 24 x 31 mm. Patient underwent microdissection with debulking of tumor on 01/15 by Dr. Venetia Maxon.  Later that day, patient with decrease in LOC with difficulty following commands.  CT brain with right frontal edema and acute intraparenchymal hemorrhage 4.7 X 3.3cm with leftward shift and small amount of SDH.  Patient taken to OR for right frontal crani for evacuation of ICH that pm.  Post op sedated and vent dependent.  Placed on decadron, keppra and IV antibiotics. Patient with difficulty with vent wean,required tracheostomy on 01/25 and currently tolerating ATC.  Patient with LUE weakness. Therapy evaluation done 01/23- patient now sitting at EOB with fluctuating  Attention and utilizing LUE for ADL tasks.  MD, PT, OT recommending CIR.  Review of Systems  Unable to perform ROS: medical condition  Eyes: Positive for blurred vision and double vision.   Past Medical History  Diagnosis Date  . Recurrent sinus infections     just finished erythromycin dosing 06/18/11  . Hypertension   . Bladder spasms     takes oxybutinin  . Headache   . Dizziness   . Vision changes     r/t brain tumor  . Arthritis   . Depression     situational  . Neoplasm of brain    Past Surgical History  Procedure Date  . Total knee arthroplasty with revision.     R  . Abdominal hysterectomy   . Cystectomy     L wrist  . Stapedes surgery     repalced stapes  . Eye surgery 1977    retina repair  . Appendectomy   . Craniotomy 06/22/2011    Procedure: CRANIOTOMY TUMOR EXCISION;  Surgeon: Dorian Heckle, MD;  Location: [facility];  Service: Neurosurgery;  Laterality: Right;  RIGHT pterional Craniotomy for meningioma  . Craniotomy 06/22/2011    Procedure: CRANIOTOMY HEMATOMA EVACUATION SUBDURAL;  Surgeon: Dorian Heckle, MD;  Location: [facility];  Service: Neurosurgery;  Laterality: Right;   Family history: patient unable to state.  Social History: Married.  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 Per reports  she has never smoked. She does not have any smokeless tobacco history on file. Per reports that she does not drink alcohol or use illicit drugs. Husband retired and can provide assist past discharge.  Reports patient with declining vision and balance problems since October and and been furniture walking.   Allergies  Allergen Reactions  . Morphine And Related Other (See Comments)    Pt says it felt like her head was coming off.    Prior to Admission medications   Medication Sig Start Date End Date Taking? Authorizing Provider  atenolol (TENORMIN) 25 MG tablet Take 25 mg by mouth daily.    Yes Historical Provider, MD  cetirizine (ZYRTEC) 10 MG tablet Take 10 mg by mouth daily as needed. For allergies   Yes Historical Provider, MD  dexamethasone (DECADRON) 4 MG tablet Take 4 mg by mouth 4 (four) times daily.   Yes Historical Provider, MD  famotidine (PEPCID) 20 MG tablet Take 20  mg by mouth 2 (two) times daily.   Yes Historical Provider, MD  naproxen sodium (ANAPROX) 220 MG tablet Take 440 mg by mouth daily as needed. For headache.    Yes Historical Provider, MD  oxybutynin (DITROPAN) 5 MG tablet Take 5 mg by mouth 2 (two) times daily.    Yes Historical Provider, MD   Scheduled Medications:    . antiseptic oral rinse  15 mL Mouth Rinse QID  . chlorhexidine  15 mL Mouth Rinse BID  . levETIRAcetam  500 mg Per Tube BID  . pantoprazole sodium  40 mg Per Tube Q1200  . sulfamethoxazole-trimethoprim  1 tablet Oral Q12H   PRN MED's: acetaminophen,  acetaminophen-codeine, labetalol, ondansetron (ZOFRAN) IV, ondansetron, polyethylene glycol, DISCONTD: midazolam  Home: Home Living Lives With:  Husband.  One level home with 6 STE with 2 rails.  Three wide steps at carport.   Functional History: Prior Function Level of Independence: Independent PTA.  Quit driving since visual decline. No assistive device. Functional Status:  Mobility: Bed Mobility Bed Mobility: Yes Supine to Sit: 1: +2 Total assist;Patient percentage (comment) (10%) Supine to Sit Details (indicate cue type and reason): opens eyes open sitting and actively participating in exercises with assistance. Verbal cues to reach with Rt. UE to assist with pull to sit, pt able to initiate movement however unable to complete task.  Sitting - Scoot to Edge of Bed: Patient percentage (comment);1: +1 Total assist (10%) Sitting - Scoot to Edge of Bed Details (indicate cue type and reason): Verbal cues for anterior weightshift, pt able to perform with tactile cues.  Sit to Supine: 1: +2 Total assist;Patient percentage (comment) (0%) Scooting to HOB: 1: +2 Total assist;Patient percentage (comment) (0%) Scooting to [facility] Details (indicate cue type and reason): Pt able to maintain Rt. UE crossed over body but not Lt. Able to lift head.  Transfers Transfers: Yes Sit to Stand: 1: +2 Total assist;From bed;From elevated surface;Patient percentage (comment) (pt = 40%) Sit to Stand Details (indicate cue type and reason): Verbal cues for anterior weightshift and contribution to task. Pt initially requiring Lt. knee to be blocked however once in standing blocking not necessary.  Stand to Sit: 1: +2 Total assist;Patient percentage (comment);To chair/3-in-1 (pt = 40%) Stand to Sit Details: Verbal cues for control of descent.  Stand Pivot Transfers: 1: +2 Total assist;Patient percentage (comment);From elevated surface (pt = 40%) Stand Pivot Transfer Details (indicate cue type and reason): Verbal cues for  turning to chair, pt keeping eyes open throughout transfer. Pt with decreased ability to weightsift for stepping during transfer. Performed with chair to Rt. side.  Ambulation/Gait Ambulation/Gait: No    ADL: ADL Eating/Feeding: NPO Where Assessed - Eating/Feeding:  (Panda) Grooming: Performed;Wash/dry face;Moderate assistance Grooming Details (indicate cue type and reason): pt rubbing eyes with Lt UE and provided wash cloth. pt provided max v/c to wash face. Pt following commands intermittently less than 50% of times. Pt washing eyes with wash cloth without command. Pt using Lt Ue feeling tubing of panda tube and RN (paul) made aware that pt touching and feeling tubing. Pt has not pulled tube yet to attempt removal during our session. Green mitten placed at this time as precaution. Where Assessed - Grooming: Sitting, chair;Supported Product manager: Not assessed Lower Body Bathing: Not assessed Upper Body Dressing: Not assessed Lower Body Dressing: +1 Total assistance Lower Body Dressing Details (indicate cue type and reason): doff prafo and don socks. Pt lifting Rt LE to command to don  sock Where Assessed - Lower Body Dressing: Sitting, bed;Supported Ambulation Related to ADLs: none ADL Comments: Pt sitting EOB Min Guard A for ~45 seconds. Pt using BIL Ue to support trunk at EOB. Pt with posterior Lt lean with EOB sitting. Pt actively using Rt side to initiate sitting. Pt kicking Rt LE at EOB. Pt weight bearing through Lt UE and reaching with Rt LE. Pt reaching for socks with Min v/c. Pt sat EOB ~15 minutes. Pt opening eyes intermittently with increased arousal at EOB. Pt with focused attention demonstrated. Pt with brief sustained attention ~2-3 seconds to reaching for therapist, sqeeze Veltri, thumbs up. Pt requires verbal cues throughout session to keep arousal even at EOB.  Cognition: Cognition Arousal/Alertness: Lethargic (aroused with name call and though eyes closed  participating) Orientation Level: Oriented to person;Oriented to place Cognition Arousal/Alertness: Lethargic (aroused with name call and though eyes closed participating) Overall Cognitive Status: Difficult to assess Difficult to assess due to: intubated;level of arousal Orientation Level: Oriented to person;Oriented to place Following Commands: Follows one step commands consistently (pt require command repeated but completed simple task) Cognition - Other Comments: pt required repetition of commands question due to delayed progessing / ability to hear therapist / cognitive impairment. Pt completed simple commands 2 out 2 trials for all.  Blood pressure 117/64, pulse 72, temperature 99.2 F (37.3 C), temperature source Oral, resp. rate 18, height 5\' 3"  (1.6 m), weight 78.5 kg (173 lb 1 oz), SpO2 96.00%. Physical Exam  Constitutional: She appears well-developed and well-nourished.  HENT:  Head: Normocephalic.       Right frontal incision clean, dry and intact. #6 cuffed trach in place.  Eyes: Pupils are equal, round, and reactive to light.  Neck:       #6 cuffed trach in place.  Cardiovascular: Normal rate and regular rhythm.   Pulmonary/Chest: Effort normal.       Few rhonchi.  Neurological:       Left inattention-?field cut.  Distracted and unable to follow basic commands consistently.  Left hemiparesis with left foot drop.  Few beats clonus left. Patient did not make eye contact with me on the right either. Affect is very blunted. She is nonverbal. She did attempt to lip a few words to me. Able to follow any simple commands using the right side although she used the right side more spontaneously than the left.  Skin: Skin is warm and dry.  Psychiatric: Her affect is blunt. She is slowed. Cognition and memory are impaired.    Results for orders placed during the hospital encounter of 06/22/11 (from the past 24 hour(s))  URINALYSIS, ROUTINE W REFLEX MICROSCOPIC     Status: Abnormal    Collection Time   07/04/11  3:08 PM      Component Value Range   Color, Urine YELLOW  YELLOW    APPearance CLEAR  CLEAR    Specific Gravity, Urine 1.018  1.005 - 1.030    pH 6.5  5.0 - 8.0    Glucose, UA NEGATIVE  NEGATIVE (mg/dL)   Hgb urine dipstick MODERATE (*) NEGATIVE    Bilirubin Urine NEGATIVE  NEGATIVE    Ketones, ur NEGATIVE  NEGATIVE (mg/dL)   Protein, ur NEGATIVE  NEGATIVE (mg/dL)   Urobilinogen, UA 0.2  0.0 - 1.0 (mg/dL)   Nitrite NEGATIVE  NEGATIVE    Leukocytes, UA SMALL (*) NEGATIVE   URINE MICROSCOPIC-ADD ON     Status: Abnormal   Collection Time   07/04/11  3:08  PM      Component Value Range   WBC, UA 3-6  <3 (WBC/hpf)   RBC / HPF 11-20  <3 (RBC/hpf)   Bacteria, UA FEW (*) RARE   BASIC METABOLIC PANEL     Status: Abnormal   Collection Time   07/05/11  5:45 AM      Component Value Range   Sodium 141  135 - 145 (mEq/L)   Potassium 3.7  3.5 - 5.1 (mEq/L)   Chloride 100  96 - 112 (mEq/L)   CO2 31  19 - 32 (mEq/L)   Glucose, Bld 123 (*) 70 - 99 (mg/dL)   BUN 15  6 - 23 (mg/dL)   Creatinine, Ser 1.61  0.50 - 1.10 (mg/dL)   Calcium 9.5  8.4 - 09.6 (mg/dL)   GFR calc non Af Amer >90  >90 (mL/min)   GFR calc Af Amer >90  >90 (mL/min)   No results found.  Assessment/Plan: Diagnosis: Meningioma status post resection with postoperative intracranial hemorrhage 1. Does the need for close, 24 hr/day medical supervision in concert with the patient's rehab needs make it unreasonable for this patient to be served in a less intensive setting? Yes 2. Co-Morbidities requiring supervision/potential complications: Postoperative sequelae and seizure risk 3. Due to bladder management, bowel management, safety, skin/wound care, disease management, medication administration, pain management and patient education, does the patient require 24 hr/day rehab nursing? Yes 4. Does the patient require coordinated care of a physician, rehab nurse, PT (1-2 hrs/day, 5 days/week), OT (1-2 hrs/day,  5 days/week) and SLP (1-2 hrs/day, 5 days/week) to address physical and functional deficits in the context of the above medical diagnosis(es)? Yes Addressing deficits in the following areas: balance, endurance, locomotion, strength, transferring, bowel/bladder control, bathing, dressing, feeding, grooming, toileting, cognition, speech, language, swallowing and psychosocial support 5. Can the patient actively participate in an intensive therapy program of at least 3 hrs of therapy per day at least 5 days per week? Yes and Potentially 6. The potential for patient to make measurable gains while on inpatient rehab is excellent 7. Anticipated functional outcomes upon discharge from inpatients are supervision to minimal assist to PT, minimal assistance to perhaps moderate assist OT, supervision to minimal assistance SLP 8. Estimated rehab length of stay to reach the above functional goals is: 3+ week 9. Does the patient have adequate social supports to accommodate these discharge functional goals? Yes and Potentially 10. Anticipated D/C setting: Home 11. Anticipated post D/C treatments: Outpt therapy 12. Overall Rehab/Functional Prognosis: excellent  RECOMMENDATIONS: This patient's condition is appropriate for continued rehabilitative care in the following setting: CIR Patient has agreed to participate in recommended program. Potentially Note that insurance prior authorization may be required for reimbursement for recommended care.  Comment: Rehabilitation nurse to followup.   Faith Rogue, MD 07/05/2011

## 2011-07-05 NOTE — Progress Notes (Signed)
Patient: Jacqueline Christian MRN: [id number] Date of admission: 06/22/2011 Date of service: 07/05/2011                                                                        PCCM PROGRESS NOTE  HPI:  66 y/o woman with a Hx of HTN admitted with dizziness and visual disturbance. 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 Subsequently Dr. Venetia Maxon (Neurosurgery) performed microdissection/debulking of hemangioma on 06/21/11 at 16:00 pm. At 21:00 the patient became unresponsive and a repeat imaging of her head demonstrated a postoperative subdural hematoma that required an emergent hematoma decompression. Patient tolerated second procedure well. PCCM was consulted for a continued ventilator management.  Antibiotics:   Cefazolin (post surgical prophylaxis only) 1/15 >> stopped Ceftriaxone 1/21 >>   Cultures/Sepsis Markers:   Resp 1/21 >> NOF  Access/Protocols:  Left radial Aline 1/15>> 1/20 ETT (7.5 mm) 1/15 >> 1/21 (failed due to stridor and secretions) ETT 1/21 >>1/25 Perc trach [name] 1/25>>>  Best Practice: DVT: Coagulopathic, SCDs GI: Protonix  Subjective/ Overnight:  Trach collar continues successful  Physical Exam: Filed Vitals:   07/05/11 1137  BP: 132/66  Pulse: 90  Temp:   Resp: 19    Intake/Output Summary (Last 24 hours) at 07/05/11 1151 Last data filed at 07/05/11 1100  Gross per 24 hour  Intake   1485 ml  Output   1335 ml  Net    150 ml   Vent Mode:  [-]  FiO2 (%):  [28 %] 28 %  Physical Exam: General: chronically ill appearing female, NAD in bed Neuro: drowsy, does follow commands, eyes open, tracks CV: s1s2 rrr, distant, NSR PULM: CTA GI: abd soft, nt, +bs, tol TF Extremities:  Warm and dry, scant BLE edema    Labs:  Lab 07/05/11 0545 07/04/11 0500 07/03/11 0423  NA 141 139 140  K 3.7 3.7 3.2*  CL 100 102  105  CO2 31 30 28   BUN 15 14 16   CREATININE 0.64 0.53 0.55  GLUCOSE 123* 129* 135*    Lab 07/04/11 0500 07/03/11 0423 06/30/11 0407  HGB 10.2* 9.8* 10.0*  HCT 31.1* 29.5* 29.7*  WBC 13.1* 13.1* 14.9*  PLT 191 166 172    Lab 07/03/11 0423  MG 2.2   Lab Results  Component Value Date   CALCIUM 9.5 07/05/2011   PHOS 3.4 07/03/2011    CXR: Dg Chest Port 1 View  07/03/2011   PORTABLE CHEST - 1 VIEW  Comparison: 07/02/2011   IMPRESSION:  1.  Stable atelectasis in the left lower lobe and right midlung.  A feeding tube has been placed; the support apparatus is otherwise stable.      Assessment & Plan: 66 y/o female s/p resection of a meningioma who developed AMS due to an acute subdural and intracranial hemorrhage. Now s/p craniotomy and hematoma evacuation.   Acute respiratory failure -- s/p resection of meningioma with acute intracranial hemorrhage s/p craniotomy and hematoma evacuation. Failed extubation 1/21 due to stridor and secretions. Tol ATC wean s/p trach 1/25.  PLAN -  Cont ATC wean  as tol pulm hygiene  PMV, cuff down pcxr follow up for linear ATX rt ,. Improved pcxr overall  Meningioma, post op acute intracranial hemorrhage s/p craniotomy and hematoma evacuation.  PLAN -  - Mgmt per Dr Venetia Maxon. Keep sedation as light as possible -Avoid free water and pos balance, na 138, dc -Avoid d5 in fluids If ok would like to Tx to sdu bed   Hypokalemia - Normal renal function.  PLAN -  -resolved  Protein calorie malnutrition -  tol TF.  -cont TF as tol ppi slp eval Drop cuff on trach  Thrombocytopenia resolved  PLAN -  -No heparin products  -SCD's for DVT prophylaxis  -f/u cbc in am   Hyperglycemia - No Hx of DM.  PLAN - ICU hyperglycemia protocol, controlled well TF  UTI? Bactrim, but UA not too impressive crt while on bactrim Avoid foley when able After another day abx, reattmpt removal  [name]. Tyson Alias, MD, FACP Pgr: [phone] [facility]   Formulated above plan with NP, OK to dc CVL, OK to transfer to vent SDU wean bed, Expect her to be liberated from vent, progress with rehab remains to be seen  Nelda Bucks.   *Care during the described time interval was provided by me and/or other providers on the critical care team. I have reviewed this patient's available data, including medical history, events of note, physical examination and test results as part of my evaluation.

## 2011-07-05 NOTE — Progress Notes (Signed)
Speech Language/Pathology Speech Pathology  PMSV (Passy-Muir Speaking Valve) Evaluation  I. HPI: Pt is a 66 year old female who underwent microdissection of right sphenoid wing meningioma on 1/14. Following procedure, pt developed subdural hemorrhage and underwnt craniotomy and evacuation. Following this proccedure pt with difficulty weakning with VDRF. Pt was intubated from 1/15-1/21 and underwent tracheotomy on 1/25. Pt has been tolerating ATC since 1/25.   II. Tracheostomy Tube  Initial trach Placement date: 1/25  Trach collar period: 24/7 Type: shiley     Size: 6     FiO2: 24%  Cuff: yes Fenestration:  no Secretion description: minimal to none. Rn reports she has not had to suction pt. No yankauer present in room.  Frequency of tracheal suctioning:0  Level of secretion expectoration: none.   II. Ventilator Dependency  no Weaning Trials:  Yes, completed 1/28 Vent Mode:        Nocturnal Vent: no Vt:     Rate:  PEEP:    PS:   FiO2:   IV. Cuff Deflation Trials  yes for 24  hours  V. PMSV Trial PMSV was placed for 10 minutes. Able to redirect subglottic air through upper airway: yes Able to attain phonation with PMSV: yes Able to expectorate secretions with PMSV: none Breath Support for phonation: adequate though phonation attempts limited due to lethargy/poor sustained attention Intelligibiity: 100 at word level RR: 15  SpO2: 97% HR:100  Behavior: quiet  VI. Clinical Impression:  Pt tolerate valve with adequate redirection of air to upper airway for phonation and stable vital signs. No CO2 trapping observed. However, pt lethargic with very little verbalization or participation at this time. Pt about to change rooms. Will defer use of PMSV with staff supervision until a second observation session is complete tomorrow.   Therapy Diagnosis:  Cognitive deficits, dysphonia secondary to tracheostomy.   VII. Recommendations  1. Patient to use PMSV with SLP supervision  2.Will  complete swallow eval tomorrow am if pt continue to toelrate valve.  3.  VIII. Treatment Plan  yes Frequency/Duration: 2x week Short Term Goals Pt will wear PMSV during 30 minute session with SLP with appropriate alertness and no indication of intolerance.  Pain: no  Harlon Ditty, MA CCC-SLP [phone]

## 2011-07-05 NOTE — Progress Notes (Signed)
Utilization review completed. Bonita Brindisi, RN, BSN. 07/05/11   

## 2011-07-05 NOTE — Progress Notes (Signed)
Occupational Therapy Treatment Patient Details Name: Jacqueline Christian MRN: [id number] DOB: 1946-01-21 Today's Date: 07/05/2011  OT Assessment/Plan OT Assessment/Plan Comments on Treatment Session: Pt transfered EOB<>chair with Lt LE blocked. Pt weight bearing Rt LE without any blocking. pt required faciliation to shift weight and transfer to the right side.  OT Plan: Discharge plan remains appropriate OT Frequency: Min 2X/week Follow Up Recommendations: Inpatient Rehab Equipment Recommended: Defer to next venue OT Goals Acute Rehab OT Goals OT Goal Formulation: Patient unable to participate in goal setting Time For Goal Achievement: 2 weeks ADL Goals Pt Will Perform Grooming: with min assist;Supported;Sitting, chair;with cueing (comment type and amount) ADL Goal: Grooming - Progress: Progressing toward goals Pt Will Perform Upper Body Bathing: with min assist;Sitting, chair;Supported ADL Goal: Product manager - Progress: Not met Pt Will Perform Upper Body Dressing: with min assist;Sitting, chair;Supported ADL Goal: Patent attorney - Progress: Not met Pt Will Transfer to Toilet: with max assist;Stand pivot transfer;3-in-1 ADL Goal: Toilet Transfer - Progress: Progressing toward goals Miscellaneous OT Goals Miscellaneous OT Goal #1: Pt will tolearate sitting EOB with Mod A as precursor to ADls OT Goal: Miscellaneous Goal #1 - Progress: Progressing toward goals Miscellaneous OT Goal #2: Pt will demonstrate focused attention ~5 seconds on ADL tasks to demonstrate increased arousal and precursor to ADL tasks at sink level. OT Goal: Miscellaneous Goal #2 - Progress: Progressing toward goals  OT Treatment Precautions/Restrictions  Precautions Precautions: Fall Precaution Comments: Currently on trach collar (off vent since Friday 07/02/11) [name], [place]) Required Braces or Orthoses: Yes Other Brace/Splint: BIl LE Prafo boots Restrictions Weight Bearing Restrictions: No    ADL ADL Eating/Feeding: NPO Where Assessed - Eating/Feeding:  (Panda) Grooming: Performed;Wash/dry face;Moderate assistance Grooming Details (indicate cue type and reason): pt rubbing eyes with Lt UE and provided wash cloth. pt provided max v/c to wash face. Pt following commands intermittently less than 50% of times. Pt washing eyes with wash cloth without command. Pt using Lt Ue feeling tubing of panda tube and RN (paul) made aware that pt touching and feeling tubing. Pt has not pulled tube yet to attempt removal during our session. Green mitten placed at this time as precaution. Where Assessed - Grooming: Sitting, chair;Supported Product manager: Not assessed Lower Body Bathing: Not assessed Upper Body Dressing: Not assessed Lower Body Dressing: +1 Total assistance Lower Body Dressing Details (indicate cue type and reason): doff prafo and don socks. Pt lifting Rt LE to command to don sock Where Assessed - Lower Body Dressing: Sitting, bed;Supported Ambulation Related to ADLs: none ADL Comments: Pt sitting EOB Min Guard A for ~45 seconds. Pt using BIL Ue to support trunk at EOB. Pt with posterior Lt lean with EOB sitting. Pt actively using Rt side to initiate sitting. Pt kicking Rt LE at EOB. Pt weight bearing through Lt UE and reaching with Rt LE. Pt reaching for socks with Min v/c. Pt sat EOB ~15 minutes. Pt opening eyes intermittently with increased arousal at EOB. Pt with focused attention demonstrated. Pt with brief sustained attention ~2-3 seconds to reaching for therapist, sqeeze Thurman, thumbs up. Pt requires verbal cues throughout session to keep arousal even at EOB. Mobility  Bed Mobility Bed Mobility: Yes Supine to Sit: 1: +2 Total assist;Patient percentage (comment) (10%) Supine to Sit Details (indicate cue type and reason): opens eyes open sitting and actively participating in exercises with assistance. Verbal cues to reach with Rt. UE to assist with pull to sit, pt able to  initiate  movement however unable to complete task.  Sitting - Scoot to Edge of Bed: Patient percentage (comment);1: +1 Total assist (10%) Sitting - Scoot to Edge of Bed Details (indicate cue type and reason): Verbal cues for anterior weightshift, pt able to perform with tactile cues.  Transfers Sit to Stand: 1: +2 Total assist;From bed;From elevated surface;Patient percentage (comment) (pt = 40%) Sit to Stand Details (indicate cue type and reason): Verbal cues for anterior weightshift and contribution to task. Pt initially requiring Lt. knee to be blocked however once in standing blocking not necessary.  Stand to Sit: 1: +2 Total assist;Patient percentage (comment);To chair/3-in-1 (pt = 40%) Stand to Sit Details: Verbal cues for control of descent.  Exercises General Exercises - Lower Extremity Ankle Circles/Pumps: AAROM;Seated;Both;5 reps;PROM Long Arc Quad: AAROM;Both;5 reps;Seated (Rt. able to perform AROM ~40% of time)  End of Session OT - End of Session Equipment Utilized During Treatment: Gait belt Activity Tolerance: Patient tolerated treatment well Patient left: in chair;with call bell in reach;with restraints reapplied (mitten applied Rt UE) Nurse Communication: Mobility status for transfers (lift pad placed behind pt for RN / Tech to lift back to bed) General Behavior During Session: Lethargic Cognition: Impaired, at baseline Cognitive Impairment: Unable to fully assess, decreased ability to follow commands (follows one step ~20-30% of time)  Jacqueline Christian  07/05/2011, 9:37 AM Pager: [phone]

## 2011-07-05 NOTE — Progress Notes (Signed)
Physical Therapy Treatment Patient Details Name: Jacqueline Christian MRN: [id number] DOB: [birth year] Today's Date: 07/05/2011  PT Assessment/Plan  PT - Assessment/Plan Comments on Treatment Session: 66 y/o woman with a Hx of HTN admitted with dizziness and visual disturbance. Imagaing discovered a complicated right sphenoid wing meningioma with encasement of the right carotid artery and right optic nerve.Pt later became unresponsive after tumor removal, a postoperative subdural hematoma was discovered that required hematoma decompression. Pt tolerated sitting EOB well today with fluctuating attention and ability to keep eyes open. Pt contributed well to stand pivot transfer (pt = ~40%). Pt making slow but good progress. Pt's BP= 109/49, 117/65, 117/64 in supine, sitting, then post transfer respectively.  PT Plan: Discharge plan remains appropriate;Frequency remains appropriate PT Frequency: Min 3X/week Recommendations for Other Services: Rehab consult (pending progress) Follow Up Recommendations: Inpatient Rehab vs. SNF Equipment Recommended: Defer to next venue PT Goals  Acute Rehab PT Goals Pt will Roll Supine to Left Side: with min assist PT Goal: Rolling Supine to Left Side - Progress: Progressing toward goal Pt will go Supine/Side to Sit: with mod assist PT Goal: Supine/Side to Sit - Progress: Progressing toward goal Pt will Sit at [facility] of Bed: with min assist;3-5 min;with bilateral upper extremity support PT Goal: Sit at Edge Of Bed - Progress: Progressing toward goal Pt will go Sit to Stand: with max assist;from elevated surface PT Goal: Sit to Stand - Progress: Progressing toward goal Pt will go Stand to Sit: with mod assist PT Goal: Stand to Sit - Progress: Progressing toward goal Pt will Transfer Bed to Chair/Chair to Bed: with mod assist PT Transfer Goal: Bed to Chair/Chair to Bed - Progress: Progressing toward goal  PT Treatment Precautions/Restrictions  Precautions Precautions:  Fall Precaution Comments: Currently on trach collar (off vent since Friday 07/02/11) [name], [place]) Required Braces or Orthoses: Yes Other Brace/Splint: BIl LE Prafo boots Restrictions Weight Bearing Restrictions: No Mobility (including Balance) Bed Mobility Bed Mobility: Yes Supine to Sit: 1: +2 Total assist;Patient percentage (comment) (10%) Supine to Sit Details (indicate cue type and reason): opens eyes open sitting and actively participating in exercises with assistance. Verbal cues to reach with Rt. UE to assist with pull to sit, pt able to initiate movement however unable to complete task.  Sitting - Scoot to Edge of Bed: Patient percentage (comment);1: +1 Total assist (10%) Sitting - Scoot to Edge of Bed Details (indicate cue type and reason): Verbal cues for anterior weightshift, pt able to perform with tactile cues.  Transfers Transfers: Yes Sit to Stand: 1: +2 Total assist;From bed;From elevated surface;Patient percentage (comment) (pt = 40%) Sit to Stand Details (indicate cue type and reason): Verbal cues for anterior weightshift and contribution to task. Pt initially requiring Lt. knee to be blocked however once in standing blocking not necessary.  Stand to Sit: 1: +2 Total assist;Patient percentage (comment);To chair/3-in-1 (pt = 40%) Stand to Sit Details: Verbal cues for control of descent.  Stand Pivot Transfers: 1: +2 Total assist;Patient percentage (comment);From elevated surface (pt = 40%) Stand Pivot Transfer Details (indicate cue type and reason): Verbal cues for turning to chair, pt keeping eyes open throughout transfer. Pt with decreased ability to weightsift for stepping during transfer. Performed with chair to Rt. side.  Ambulation/Gait Ambulation/Gait: No  Balance Balance Assessed: Yes Static Sitting Balance Static Sitting - Balance Support: Feet supported;Bilateral upper extremity supported Static Sitting - Level of Assistance: Other (comment) (Min-guard  assist) Static Sitting - Comment/# of Minutes: Pt sitting  min-guard at EOB for ~45 sec at a time, otherwise pt requiring fluctuating assistance up to moderate assist. Pt has tendency to lean to the Lt however noted corrective righting reactions through Rt. trunk and Rt. UE. Pt able to tolerate 15 min total sitting time. Pt with fluctuating attention and ability to maintain eyes open.  Exercise  General Exercises - Lower Extremity Ankle Circles/Pumps: AAROM;Seated;Both;5 reps;PROM Long Arc Quad: AAROM;Both;5 reps;Seated (Rt. able to perform AROM ~40% of time) End of Session PT - End of Session Equipment Utilized During Treatment: Gait belt Activity Tolerance: Treatment limited secondary to medical complications (Comment);Patient tolerated treatment well (Limitations of arousal level) Patient left: in chair Nurse Communication: Mobility status for transfers General Behavior During Session: Lethargic Cognition: Impaired, at baseline Cognitive Impairment: Unable to fully assess, decreased ability to follow commands (follows one step ~20-30% of time)  Sherie Don 07/05/2011, 10:05 AM  Sherie Don) Carleene Mains PT, DPT Acute Rehabilitation [phone]

## 2011-07-05 NOTE — Plan of Care (Signed)
Problem: Phase II Progression Outcomes Goal: Progress activity as tolerated unless otherwise ordered Outcome: Progressing Pt stand pivot to the Right side EOB<>Chair total +2 Pt 40%

## 2011-07-05 NOTE — Progress Notes (Signed)
Subjective: Patient reports (resting quietly)  Objective: Vital signs in last 24 hours: Temp:  [98 F (36.7 C)-99.5 F (37.5 C)] 99.2 F (37.3 C) (01/28 0817) Pulse Rate:  [67-107] 72  (01/28 0817) Resp:  [16-21] 18  (01/28 0817) BP: (96-123)/(42-97) 117/64 mmHg (01/28 0845) SpO2:  [94 %-100 %] 96 % (01/28 0845) FiO2 (%):  [28 %] 28 % (01/28 0845) Weight:  [78.5 kg (173 lb 1 oz)] 78.5 kg (173 lb 1 oz) (01/28 0500)  Intake/Output from previous day: 01/27 0701 - 01/28 0700 In: 1490 [NG/GT:1440; IV Piggyback:50] Out: 1645 [Urine:1645] Intake/Output this shift: Total I/O In: 250 [Other:140; NG/GT:110] Out: -   Responds to voice, resting quietly at present. Discussion with family and nurse, pt not disturbed.  Per nsg, continues to follow commands, periods of lethargy. LUE weakness without change.   Lab Results:  Basename 07/04/11 0500 07/03/11 0423  WBC 13.1* 13.1*  HGB 10.2* 9.8*  HCT 31.1* 29.5*  PLT 191 166   BMET  Basename 07/05/11 0545 07/04/11 0500  NA 141 139  K 3.7 3.7  CL 100 102  CO2 31 30  GLUCOSE 123* 129*  BUN 15 14  CREATININE 0.64 0.53  CALCIUM 9.5 9.3    Studies/Results: No results found.  Assessment/Plan: Stable with trach collar, to wean vent, Rehab c/s.  LOS: 13 days  Continue support, CIR c/s per Dr. Venetia Maxon.   Georgiann Cocker 07/05/2011, 9:30 AM     Patient making dramatic improvements, both in cognition and left sided weakness. Tx to SDU.  Speech/swallowing per CCM.  Rehab c/s.  Doppler LEs to r/o DVT.  30 minute discussion with family today about status and prognosis.

## 2011-07-06 ENCOUNTER — Inpatient Hospital Stay (HOSPITAL_COMMUNITY): Payer: Medicare Other

## 2011-07-06 DIAGNOSIS — Z9911 Dependence on respirator [ventilator] status: Secondary | ICD-10-CM

## 2011-07-06 DIAGNOSIS — I6200 Nontraumatic subdural hemorrhage, unspecified: Secondary | ICD-10-CM

## 2011-07-06 DIAGNOSIS — I619 Nontraumatic intracerebral hemorrhage, unspecified: Secondary | ICD-10-CM

## 2011-07-06 DIAGNOSIS — J9600 Acute respiratory failure, unspecified whether with hypoxia or hypercapnia: Secondary | ICD-10-CM

## 2011-07-06 DIAGNOSIS — M79609 Pain in unspecified limb: Secondary | ICD-10-CM

## 2011-07-06 LAB — CBC
HCT: 31.9 % — ABNORMAL LOW (ref 36.0–46.0)
Hemoglobin: 10.7 g/dL — ABNORMAL LOW (ref 12.0–15.0)
RBC: 3.53 MIL/uL — ABNORMAL LOW (ref 3.87–5.11)
RDW: 13.5 % (ref 11.5–15.5)
WBC: 9.9 10*3/uL (ref 4.0–10.5)

## 2011-07-06 LAB — DIFFERENTIAL
Basophils Absolute: 0.1 10*3/uL (ref 0.0–0.1)
Lymphocytes Relative: 13 % (ref 12–46)
Monocytes Absolute: 0.6 10*3/uL (ref 0.1–1.0)
Monocytes Relative: 6 % (ref 3–12)
Neutro Abs: 7.4 10*3/uL (ref 1.7–7.7)

## 2011-07-06 MED ORDER — ENSURE IMMUNE HEALTH PO LIQD: 237.0000 mL | Freq: Three times a day (TID) | ORAL | Status: DC

## 2011-07-06 NOTE — Progress Notes (Signed)
Subjective: Patient reports "I'm ok."  Objective: Vital signs in last 24 hours: Temp:  [98.6 F (37 C)-99.6 F (37.6 C)] 98.7 F (37.1 C) (01/29 0453) Pulse Rate:  [76-94] 92  (01/29 0453) Resp:  [16-21] 21  (01/29 0453) BP: (109-132)/(46-68) 114/68 mmHg (01/29 0453) SpO2:  [96 %-100 %] 97 % (01/29 0453) FiO2 (%):  [28 %] 28 % (01/29 0325) Weight:  [77.7 kg (171 lb 4.8 oz)] 77.7 kg (171 lb 4.8 oz) (01/29 0453)  Intake/Output from previous day: 01/28 0701 - 01/29 0700 In: 1330 [NG/GT:1190] Out: 1275 [Urine:1075; Stool:200] Intake/Output this shift:    Opens eyes to voice (briskly today) and responds verbally. Follows commands. LUE remains weak. Short period of interaction, tasks. Fatigue.  Lab Results:  Basename 07/06/11 0600 07/04/11 0500  WBC 9.9 13.1*  HGB 10.7* 10.2*  HCT 31.9* 31.1*  PLT 212 191   BMET  Basename 07/05/11 1639 07/05/11 0545  NA 142 141  K 4.0 3.7  CL 102 100  CO2 31 31  GLUCOSE 128* 123*  BUN 16 15  CREATININE 0.63 0.64  CALCIUM 9.7 9.5    Studies/Results: No results found.  Assessment/Plan: Improving.  LOS: 14 days  Awaits insurance approval for CIR.    Georgiann Cocker 07/06/2011, 8:23 AM

## 2011-07-06 NOTE — Progress Notes (Signed)
Patient ID: Jacqueline Christian, female   DOB: October 02, 1945, 66 y.o.   MRN: [id number]  Sitting in chair. Awake, responds appropriately. Moving all extremities, (left side weaker). Follows commands. Fatigues easily. Improving. CIR planned for tomorrow.  Georgiann Cocker, RN,BSN

## 2011-07-06 NOTE — Progress Notes (Signed)
Speech Language/Pathology Speech Language Pathology Evaluation Patient Details Name: Jacqueline Christian MRN: [id number] DOB: 1945/08/27 Today's Date: 07/06/2011  Problem List: There is no problem list on file for this patient.  Past Medical History:  Past Medical History  Diagnosis Date  . Recurrent sinus infections     just finished erythromycin dosing 06/18/11  . Hypertension   . Bladder spasms     takes oxybutinin  . Headache   . Dizziness   . Vision changes     r/t brain tumor  . Arthritis   . Depression     situational  . Neoplasm of brain    Past Surgical History:  Past Surgical History  Procedure Date  . Total knee arthroplasty     R  . Abdominal hysterectomy   . Cystectomy     L wrist  . Stapedes surgery     repalced stapes  . Eye surgery 1977    retina repair  . Appendectomy   . Craniotomy 06/22/2011    Procedure: CRANIOTOMY TUMOR EXCISION;  Surgeon: Dorian Heckle, MD;  Location: [facility];  Service: Neurosurgery;  Laterality: Right;  RIGHT pterional Craniotomy for meningioma  . Craniotomy 06/22/2011    Procedure: CRANIOTOMY HEMATOMA EVACUATION SUBDURAL;  Surgeon: Dorian Heckle, MD;  Location: [facility];  Service: Neurosurgery;  Laterality: Right;    SLP Assessment/Plan/Recommendation Assessment Clinical Impression Statement: Pt presents with severe cognitive deficits consistent with right hemisphere hemorrhage. Left inattention, flat affect and intonation, reduced initiation, focused attention imparing particiaption in basic functional and verbal tasks. Suspect attention mildly improved with familiar speakers (family) though still with significant impariment. Pt will need SLP in acute setting to maximize participation in basic verbal and funcational task and complete family education. Pt would benefit from CIR at d/c.  SLP Recommendation/Assessment: Patient will need skilled Speech Lanaguage Pathology Services in the acute care venue to address identified  deficits Problem List: Orientation;Attention;Problem Solving;Executive Functioning;Social interaction/pragmatics Therapy Diagnosis: Cognitive Impairments Plan Speech Therapy Frequency: min 2x/week (PMSV treatment) Duration: 2 weeks Treatment/Interventions: Cognitive reorganization;Cueing hierarchy;Functional tasks;SLP instruction and feedback;Compensatory strategies;Patient/family education Potential to Achieve Goals: Good SLP Recommendations Recommendations for Other Services: Rehab consult Follow up Recommendations: Inpatient Rehab Equipment Recommended: Defer to next venue Individuals Consulted Consulted and Agree with Results and Recommendations: Patient unable/family or caregive not available  SLP Goals  SLP Goals Potential to Achieve Goals: Good SLP Goal #1: Pt will attend to basic functional task with max multimodal cues for 5 minutes.  SLP Goal #1 - Progress: Progressing toward goal SLP Goal #2: Pt will follow basic commands during functional task with max multimodal cues x3.  SLP Goal #2 - Progress: Progressing toward goal SLP Goal #3: Pt will verbally respond to basic questions in at least 80% of trials with max vebal cues.  SLP Goal #3 - Progress: Progressing toward goal [facility], [place] CCC-SLP [phone]  Claudine Mouton 07/06/2011, 10:08 AM

## 2011-07-06 NOTE — Procedures (Signed)
Modified Barium Swallow Procedure Note Patient Details  Name: Jacqueline Christian MRN: [id number] Date of Birth: 21-Jun-1945  Today's Date: 07/06/2011 Time:  -     Past Medical History:  Past Medical History  Diagnosis Date  . Recurrent sinus infections     just finished erythromycin dosing 06/18/11  . Hypertension   . Bladder spasms     takes oxybutinin  . Headache   . Dizziness   . Vision changes     r/t brain tumor  . Arthritis   . Depression     situational  . Neoplasm of brain    Past Surgical History:  Past Surgical History  Procedure Date  . Total knee arthroplasty     R  . Abdominal hysterectomy   . Cystectomy     L wrist  . Stapedes surgery     repalced stapes  . Eye surgery 1977    retina repair  . Appendectomy   . Craniotomy 06/22/2011    Procedure: CRANIOTOMY TUMOR EXCISION;  Surgeon: Dorian Heckle, MD;  Location: [facility];  Service: Neurosurgery;  Laterality: Right;  RIGHT pterional Craniotomy for meningioma  . Craniotomy 06/22/2011    Procedure: CRANIOTOMY HEMATOMA EVACUATION SUBDURAL;  Surgeon: Dorian Heckle, MD;  Location: [facility];  Service: Neurosurgery;  Laterality: Right;   HPI:  Pt is a 66 year old female who underwent microdissection of right sphenoid wing meningioma on 1/14. Following procedure, pt developed subdural hemorrhage and underwent craniotomy and evacuation. Following this proccedure pt with difficulty weaning with VDRF. Pt was intubated from 1/15-1/21 and underwent tracheotomy on 1/25. Pt has been tolerating ATC since 1/25. PMSV evaluation 1/28, pt tolerated valve very well. Pt with signs of decreased airway protection during bedside swallow eval. Valve in place for MBS.       Recommendation/Prognosis  Clinical Impression Dysphagia Diagnosis: Within Functional Limits Clinical impression: Pt presents with adequate oral and pharyngeal function with all consistencies tested. No aspiration or penetration was observed though pt did  exhibit some coughing/throat clearing during testing. Pt does have some cognitive deficits that will require the pt to have full supervision initially with meals. Pt will also likely need to have her mds crushed as she masticated a barium tablet due to poor understanding of task. Pt may initate a regular diet with thin liquids with basic aspiraiton precuations. Pt should always wear her PMSV during PO intake.  Swallow Evaluation Recommendations Recommended Consults: MBS General Recommendation: NPO;Alternative means - temporary Solid Consistency: Regular Liquid Consistency: Thin Liquid Administration via: Cup;Straw Medication Administration: Crushed with puree Supervision: Full supervision/cueing for compensatory strategies Compensations: Slow rate;Small sips/bites Postural Changes and/or Swallow Maneuvers: Seated upright 90 degrees;Upright 30-60 min after meal Oral Care Recommendations: Oral care QID Other Recommendations: Place PMSV during PO intake Recommendations for Other Services: Rehab consult Follow up Recommendations: Inpatient Rehab Prognosis Barriers to Reach Goals: Cognitive deficits Individuals Consulted Consulted and Agree with Results and Recommendations: Patient unable/family or caregiver not available;MD  SLP Assessment/Plan Clinical Impression Statement: Pt presents with severe cognitive deficits consistent with right hemisphere hemorrhage. Left inattention, flat affect and intonation, reduced initiation, focused attention imparing particiaption in basic functional and verbal tasks. Suspect attention mildly improved with familiar speakers (family) though still with significant impariment. Pt will need SLP in acute setting to maximize participation in basic verbal and funcational task and complete family education. Pt would benefit from CIR at d/c.  Speech Therapy Frequency: min 2x/week (PMSV treatment) Duration: 2 weeks Potential  to Achieve Goals: Good  SLP Goals  SLP  Swallowing Goals Patient will consume recommended diet without observed clinical signs of aspiration with: Moderate assistance Swallow Study Goal #1 - Progress: Progressing toward goal Patient will utilize recommended strategies during swallow to increase swallowing safety with: Maximal cueing Swallow Study Goal #2 - Progress: Progressing toward goal  [facility], MA CCC-SLP [phone]  Claudine Mouton 07/06/2011, 11:55 AM

## 2011-07-06 NOTE — Progress Notes (Signed)
Speech Language/Pathology Clinical/Bedside Swallow Evaluation Patient Details  Name: Jacqueline Christian MRN: [id number] DOB: 23-Jun-1945 Today's Date: 07/06/2011  Past Medical History:  Past Medical History  Diagnosis Date  . Recurrent sinus infections     just finished erythromycin dosing 06/18/11  . Hypertension   . Bladder spasms     takes oxybutinin  . Headache   . Dizziness   . Vision changes     r/t brain tumor  . Arthritis   . Depression     situational  . Neoplasm of brain    Past Surgical History:  Past Surgical History  Procedure Date  . Total knee arthroplasty     R  . Abdominal hysterectomy   . Cystectomy     L wrist  . Stapedes surgery     repalced stapes  . Eye surgery 1977    retina repair  . Appendectomy   . Craniotomy 06/22/2011    Procedure: CRANIOTOMY TUMOR EXCISION;  Surgeon: Dorian Heckle, MD;  Location: [facility];  Service: Neurosurgery;  Laterality: Right;  RIGHT pterional Craniotomy for meningioma  . Craniotomy 06/22/2011    Procedure: CRANIOTOMY HEMATOMA EVACUATION SUBDURAL;  Surgeon: Dorian Heckle, MD;  Location: [facility];  Service: Neurosurgery;  Laterality: Right;   HPI:  Pt is a 66 year old female who underwent microdissection of right sphenoid wing meningioma on 1/14. Following procedure, pt developed subdural hemorrhage and underwent craniotomy and evacuation. Following this proccedure pt with difficulty weaning with VDRF. Pt was intubated from 1/15-1/21 and underwent tracheotomy on 1/25. Pt has been tolerating ATC since 1/25. PMSV evaluation 1/28, pt tolerated valve very well. Valve in place for bedside swallow.     Assessment/Recommendations/Treatment Plan    SLP Assessment Clinical Impression Statement: Pt with consistent throat clearing following all trials of thin liquids indicative of reduced airway protection during swallow. Given pts impaired respiratory status, an objective test is warranted prior to initiating any POs. Will  complete evaluation today.  Risk for Aspiration: Mild Other Related Risk Factors: Decreased respiratory status;Tracheostomy  Swallow Evaluation Recommendations Recommended Consults: MBS General Recommendation: NPO;Alternative means - temporary Oral Care Recommendations: Oral care QID Other Recommendations: Place PMSV during PO intake Recommendations for Other Services: Rehab consult  Treatment Plan Treatment Plan Recommendations: Other (Comment) (Defer treatment plan until completion of MBS. ) Speech Therapy Frequency: min 2x/week (PMSV treatment) Treatment Duration: 2 weeks     Individuals Consulted Consulted and Agree with Results and Recommendations: Patient unable/family or caregiver not available;MD  Harlon Ditty, MA CCC-SLP [phone]  Shatha Hooser, Riley Nearing 07/06/2011,9:40 AM

## 2011-07-06 NOTE — Progress Notes (Signed)
Physical Therapy Treatment Patient Details Name: Jacqueline Christian MRN: [id number] DOB: 1946/01/10 Today's Date: 07/06/2011  PT Assessment/Plan  PT - Assessment/Plan Comments on Treatment Session: 66 y/o woman with a Hx of HTN admitted with dizziness and visual disturbance. 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  Pt able to make great progress with mobility but continues to be distracted and needs constant cues for redirection to task. PT Plan: Discharge plan remains appropriate;Frequency remains appropriate PT Frequency: Min 3X/week Recommendations for Other Services: Rehab consult Follow Up Recommendations: Inpatient Rehab Equipment Recommended: Defer to next venue PT Goals  Acute Rehab PT Goals PT Goal Formulation: Patient unable to participate in goal setting Time For Goal Achievement: 2 weeks Pt will Roll Supine to Right Side: with mod assist PT Goal: Rolling Supine to Right Side - Progress: Progressing toward goal Pt will go Supine/Side to Sit: with mod assist PT Goal: Supine/Side to Sit - Progress: Progressing toward goal Pt will Sit at [facility] of Bed: with min assist;3-5 min;with bilateral upper extremity support PT Goal: Sit at Edge Of Bed - Progress: Progressing toward goal Pt will go Sit to Stand: with max assist;from elevated surface PT Goal: Sit to Stand - Progress: Progressing toward goal Pt will go Stand to Sit: with mod assist PT Goal: Stand to Sit - Progress: Progressing toward goal Pt will Transfer Bed to Chair/Chair to Bed: with mod assist PT Transfer Goal: Bed to Chair/Chair to Bed - Progress: Progressing toward goal Pt will Stand: with min assist;3 - 5 min;with bilateral upper extremity support PT Goal: Stand - Progress: Goal set today Pt will Ambulate: 16 - 50 feet;with +2 total assist;with least restrictive assistive device (pt 60%) PT Goal: Ambulate - Progress: Goal set  today  PT Treatment Precautions/Restrictions  Precautions Precautions: Fall Precaution Comments: Currently on trach collar (off vent since Friday 07/02/11) Required Braces or Orthoses: Yes Other Brace/Splint: BIl LE Prafo boots Restrictions Weight Bearing Restrictions: No Mobility (including Balance) Bed Mobility Bed Mobility: Yes Supine to Sit: 1: +2 Total assist;Patient percentage (comment) (pt 50%) Supine to Sit Details (indicate cue type and reason): Pt able to increase assistance and performed 50% of assistance.  (A) to elevate trunk OOB with max cues for technique.  Pt needs constant redirection and cues due to focus attention. Sitting - Scoot to Edge of Bed: 2: Max assist Sitting - Scoot to [place] of Bed Details (indicate cue type and reason): (A) to scoot hips to EOB with proper weight shift to advance hips. Transfers Transfers: Yes Sit to Stand: 1: +2 Total assist;Patient percentage (comment);From bed;From chair/3-in-1 (pt 60%) Sit to Stand Details (indicate cue type and reason): Max tactile and VCs for proper [place]ipp placement and LE placement.  (A) to initiate transfer and forward translation. Stand to Sit: 1: +2 Total assist;Patient percentage (comment);To chair/3-in-1 (pt 60%) Stand to Sit Details: (A) to slowly descend to recliner with tactile cues for [place] placement Stand Pivot Transfers: 1: +2 Total assist;Patient percentage (comment);With armrests (pt 60%) Stand Pivot Transfer Details (indicate cue type and reason): Max VCs and manual cues for [place] and LE placement during transfer.  Pt needed manual (A) to advance hips and LE toward recliner. Ambulation/Gait Ambulation/Gait: Yes Ambulation/Gait Assistance: 1: +2 Total assist;Patient percentage (comment) (pt 60%) Ambulation/Gait Assistance Details (indicate cue type and reason): (A) to maintain balance and to redirect pt to task.  Pt continue to attempt to bend over to  scratch LE during ambulation.  Pt able to walk 8' with use  of RW but continued cues to keep hands on RW.  Cues to maintain upright posture during ambulation. Ambulation Distance (Feet): 8 Feet Assistive device: Rolling walker Gait Pattern: Step-to pattern;Decreased stance time - left  Balance Balance Assessed: Yes Static Sitting Balance Static Sitting - Balance Support: Feet supported;Bilateral upper extremity supported Static Sitting - Level of Assistance: 4: Min assist Static Sitting - Comment/# of Minutes: Occasional min (A) to promote midline and needed for safety due to forward lean to attempt to scratch LE. Exercise    End of Session PT - End of Session Equipment Utilized During Treatment: Gait belt Activity Tolerance: Patient limited by fatigue Patient left: in chair;with call bell in reach Nurse Communication: Mobility status for transfers General Behavior During Session: Restless Cognition: Impaired Cognitive Impairment: Pt more alert with focused attention.  Pt fixated on scratching LE and back during session.  Pt needs constant cues for redirection to task.  Jacqueline Christian 07/06/2011, 1:59 PM [phone]

## 2011-07-06 NOTE — Progress Notes (Signed)
Speech Language/Pathology Speech Pathology:  Jacqueline Christian Speaking Valve Treatment Note  Subjective:  Pt sleeping on arrival, awakens easily and maintains alertness throughout session  Objective:  Cuff deflated, no visible or audible secretions, pt with ATC around neck but not covering tracheostomy tube, At baseline O2 sats 99-100%, HR: 88, RR: 14-20. PMSV placed with no significant change in vital signs, immediate phonation with pt statement that her legs hurt. PMSV left in place for 50 minutes during swallow evaluation and cognitive evaluation with no signs of intolerance, no CO2 trapping with multiple checks. Pt repeatedly removes ATC with no change in O2 Sats.   Assessment:  Pt tolerating PMSV. Little verbalization due to cognitive deficits. Unable to assess breath support as pt making no attempts to verbalize beyond short phrases despite max cues.  Recommendations:  Pt may wear PMSV during all waking hours. Education completed with RN regarding removal of valve with sleep, signs of intolerance, inflation of cuff if needed. Signs in place.   Pain:   none Intervention Required:   No   Goals: Goals Partially Met Harlon Ditty, MA CCC-SLP [phone]

## 2011-07-06 NOTE — PMR Pre-admission (Signed)
PMR Admission Coordinator Pre-Admission Assessment  Patient:  Jacqueline Christian is an 66 y.o., female MRN:  [id number] DOB:  22-Mar-1946 Height:  5\' 3"  (160 cm) Weight:  77.7 kg (171 lb 4.8 oz)  Insurance Information: HMO: NO HMO    PPO:      PCP:      IPA:      80/20:      OTHER:  PRIMARY:Medicare A/B      Policy#:[id number]      Subscriber:Saori Kirkwood CM Name:       Phone#:      Fax#:  Pre-Cert#:       Employer:Retired Benefits:  Phone #:      Name:[employer]. Date:01/05/10     Deduct:$1184      Out of Pocket Max:0      Life WUJ:[id number] CIR:100%      SNF:100 days   LBD = none Outpatient:80%     Co-Pay:20% Home Health:100%      Co-Pay:none DME:80%     Co-Pay:20% Providers:patient's choice  SECONDARY:UHC      Policy#:[id number]      Subscriber:Robert Rhoads CM Name:       Phone#:      Fax#:  Pre-Cert#:       Employer:[employer] Benefits:  Phone #:[phone]     Name:  Eff. Date:      Deduct:       Out of Pocket Max:       Life Max:  CIR:       SNF:  Outpatient:       Co-Pay:  Home Health:       Co-Pay:  DME:      Co-Pay: Providers:  In network  Current Medical History:   Patient Admitting Diagnosis: Meningioma status post resection with postoperative ICH History of Present Illness: Admitted 01/15 with history of dizziness and visual deficits.  Workup revealed a right anterior sphenoid wing meningioma which appears to be encasing the right carotid and circulation.  Appears to be encasement of the right optic nerve.  Patient underwent microdissection with debulking of tumor on 01/15 by Dr. Venetia Maxon.  Later that day, patient with decrease in LOC with difficulty following commands.  CT brain with right frontal edema and acute intraparenchymal hemorrhage and small SDH.  Patient taken to OR for right frontal crani for evacuation of ICH that pm.  Post op sedated and vent dependent.  Patient with difficulty with vent wean, required trach on 01/25, tolerating PMV trials.  Has LUE weakness.   Currently has foley catheter, panda tube feed and a #6 trach in place.  Patients Past Medical History:   Past Medical History  Diagnosis Date  . Recurrent sinus infections     just finished erythromycin dosing 06/18/11  . Hypertension   . Bladder spasms     takes oxybutinin  . Headache   . Dizziness   . Vision changes     r/t brain tumor  . Arthritis   . Depression     situational  . Neoplasm of brain    Family Medical History:  family history is not on file. Patients Current Diet: General  Prior Rehab/Hospitalizations: Had outpatient therapy after R TKR.  Current Medications: Current facility-administered medications:acetaminophen (TYLENOL) suppository 650 mg, 650 mg, Rectal, Q4H PRN, Dorian Heckle, MD;  acetaminophen-codeine (TYLENOL #3) 300-30 MG per tablet 1-2 tablet, 1-2 tablet, Oral, Q4H PRN, Dorian Heckle, MD, 2 tablet at 07/06/11 1146;  antiseptic oral rinse (BIOTENE) solution 15 mL,  15 mL, Mouth Rinse, QID, Max Fickle, MD, 15 mL at 07/06/11 1200 chlorhexidine (PERIDEX) 0.12 % solution 15 mL, 15 mL, Mouth Rinse, BID, Max Fickle, MD, 15 mL at 07/06/11 0804;  feeding supplement (OSMOLITE 1.2 CAL) liquid 1,000 mL, 1,000 mL, Per Tube, Continuous, Billy Fischer, MD, Last Rate: 55 mL/hr at 07/04/11 2305, 1,000 mL at 07/04/11 2305;  labetalol (NORMODYNE,TRANDATE) injection 10-40 mg, 10-40 mg, Intravenous, Q10 min PRN, Dorian Heckle, MD, 20 mg at 06/22/11 1726 levETIRAcetam (KEPPRA) 100 MG/ML solution 500 mg, 500 mg, Per Tube, BID, Reinaldo Meeker, MD, 500 mg at 07/06/11 1146;  ondansetron (ZOFRAN) injection 4 mg, 4 mg, Intravenous, Q4H PRN, Dorian Heckle, MD;  ondansetron [facility]) tablet 4 mg, 4 mg, Oral, Q4H PRN, Dorian Heckle, MD;  pantoprazole sodium (PROTONIX) 40 mg/20 mL oral suspension 40 mg, 40 mg, Per Tube, Q1200, Billy Fischer, MD, 40 mg at 07/06/11 1146 polyethylene glycol (MIRALAX / GLYCOLAX) packet 17 g, 17 g, Oral, Daily PRN, Nada Boozer. Harris, MD, 17 g at  07/03/11 2040;  sulfamethoxazole-trimethoprim (BACTRIM DS) 800-160 MG per tablet 1 tablet, 1 tablet, Oral, Q12H, Cristi Loron, MD, 1 tablet at 07/06/11 1000;  DISCONTD: phenylephrine (MYDFRIN) 2.5 % ophthalmic solution 1 drop, 1 drop, Both Eyes, Once, Donnel Saxon, MD  Additional Precautions/Restrictions: Precautions Precautions: Fall Precaution Comments: Currently on trach collar (off vent since Friday 07/02/11) Required Braces or Orthoses: Yes Other Brace/Splint: BIl LE Prafo boots Restrictions Weight Bearing Restrictions: No  Therapy Assessments Physical Therapy: Precautions Precautions: Fall Precaution Comments: Currently on trach collar (off vent since Friday 07/02/11) Required Braces or Orthoses: Yes Other Brace/Splint: BIl LE Prafo boots Home Living Lives With: Spouse Prior Function Level of Independence: Other (comment) (unknow at this time no family present) Coordination Gross Motor Movements are Fluid and Coordinated: Not tested Fine Motor Movements are Fluid and Coordinated: Not tested  Occupational Therapy: Precautions Precautions: Fall Precaution Comments: Currently on trach collar (off vent since Friday 07/02/11) Required Braces or Orthoses: Yes Other Brace/Splint: BIl LE Prafo boots Home Living Lives With: Spouse Prior Function Level of Independence: Other (comment) (unknow at this time no family present) Coordination Gross Motor Movements are Fluid and Coordinated: Not tested Fine Motor Movements are Fluid and Coordinated: Not tested Restrictions Weight Bearing Restrictions: No ADL Eating/Feeding: NPO Where Assessed - Eating/Feeding:  (Panda) Grooming: Performed;Wash/dry face;Moderate assistance Grooming Details (indicate cue type and reason): pt rubbing eyes with Lt UE and provided wash cloth. pt provided max v/c to wash face. Pt following commands intermittently less than 50% of times. Pt washing eyes with wash cloth without command. Pt using Lt Ue  feeling tubing of panda tube and RN (paul) made aware that pt touching and feeling tubing. Pt has not pulled tube yet to attempt removal during our session. Green mitten placed at this time as precaution. Where Assessed - Grooming: Sitting, chair;Supported Product manager: Not assessed Lower Body Bathing: Not assessed Upper Body Dressing: Not assessed Lower Body Dressing: +1 Total assistance Lower Body Dressing Details (indicate cue type and reason): doff prafo and don socks. Pt lifting Rt LE to command to don sock Where Assessed - Lower Body Dressing: Sitting, bed;Supported Ambulation Related to ADLs: none ADL Comments: Pt sitting EOB Min Guard A for ~45 seconds. Pt using BIL Ue to support trunk at EOB. Pt with posterior Lt lean with EOB sitting. Pt actively using Rt side to initiate sitting. Pt kicking Rt LE at EOB. Pt weight bearing through Lt  UE and reaching with Rt LE. Pt reaching for socks with Min v/c. Pt sat EOB ~15 minutes. Pt opening eyes intermittently with increased arousal at EOB. Pt with focused attention demonstrated. Pt with brief sustained attention ~2-3 seconds to reaching for therapist, sqeeze Febres, thumbs up. Pt requires verbal cues throughout session to keep arousal even at EOB.  SLP Recommendations: Recommendations for Other Services: Rehab consult Follow up Recommendations: Inpatient Rehab Equipment Recommended: Defer to next venue Recommended Consults: MBS General Recommendation: NPO;Alternative means - temporary Solid Consistency: Regular Liquid Consistency: Thin Liquid Administration via: Cup;Straw Medication Administration: Crushed with puree Supervision: Full supervision/cueing for compensatory strategies Compensations: Slow rate;Small sips/bites Postural Changes and/or Swallow Maneuvers: Seated upright 90 degrees;Upright 30-60 min after meal Oral Care Recommendations: Oral care QID Other Recommendations: Place PMSV during PO intake Recommendations for Other  Services: Rehab consult Follow up Recommendations: Inpatient Rehab  Prior Function: Level of Independence: Other (comment) (unknow at this time no family present) ADL Eating/Feeding: NPO Where Assessed - Eating/Feeding:  (Panda) Grooming: Performed;Wash/dry face;Moderate assistance Grooming Details (indicate cue type and reason): pt rubbing eyes with Lt UE and provided wash cloth. pt provided max v/c to wash face. Pt following commands intermittently less than 50% of times. Pt washing eyes with wash cloth without command. Pt using Lt Ue feeling tubing of panda tube and RN (paul) made aware that pt touching and feeling tubing. Pt has not pulled tube yet to attempt removal during our session. Green mitten placed at this time as precaution. Where Assessed - Grooming: Sitting, chair;Supported Product manager: Not assessed Lower Body Bathing: Not assessed Upper Body Dressing: Not assessed Lower Body Dressing: +1 Total assistance Lower Body Dressing Details (indicate cue type and reason): doff prafo and don socks. Pt lifting Rt LE to command to don sock Where Assessed - Lower Body Dressing: Sitting, bed;Supported Ambulation Related to ADLs: none ADL Comments: Pt sitting EOB Min Guard A for ~45 seconds. Pt using BIL Ue to support trunk at EOB. Pt with posterior Lt lean with EOB sitting. Pt actively using Rt side to initiate sitting. Pt kicking Rt LE at EOB. Pt weight bearing through Lt UE and reaching with Rt LE. Pt reaching for socks with Min v/c. Pt sat EOB ~15 minutes. Pt opening eyes intermittently with increased arousal at EOB. Pt with focused attention demonstrated. Pt with brief sustained attention ~2-3 seconds to reaching for therapist, sqeeze Mutch, thumbs up. Pt requires verbal cues throughout session to keep arousal even at EOB.  Additional Prior Functional Levels:  Bed Mobility: I Transfers: I Mobility - Walk/Wheelchair: I Upper Body Dressing: I Lower Body Dressing: I Grooming:  I Eating/Drinking: I Toilet Transfer: I Bladder Continence: WNL Bowel Management: WNL Stair Climbing: I Communication: WNL Memory: WNL Cooking/Meal Prep: I Housework: I Money Management: I Driving: yes (Until end of October 2012)  Prior Activity Level: Community (5-7x/wk): Active - went out 3-4 X per week  ADLs/Mobility: ADL Eating/Feeding: NPO Where Assessed - Eating/Feeding:  (Panda) Grooming: Performed;Wash/dry face;Moderate assistance Grooming Details (indicate cue type and reason): pt rubbing eyes with Lt UE and provided wash cloth. pt provided max v/c to wash face. Pt following commands intermittently less than 50% of times. Pt washing eyes with wash cloth without command. Pt using Lt Ue feeling tubing of panda tube and RN (paul) made aware that pt touching and feeling tubing. Pt has not pulled tube yet to attempt removal during our session. Green mitten placed at this time as precaution. Where Assessed -  Grooming: Sitting, chair;Supported Product manager: Not assessed Lower Body Bathing: Not assessed Upper Body Dressing: Not assessed Lower Body Dressing: +1 Total assistance Lower Body Dressing Details (indicate cue type and reason): doff prafo and don socks. Pt lifting Rt LE to command to don sock Where Assessed - Lower Body Dressing: Sitting, bed;Supported Ambulation Related to ADLs: none ADL Comments: Pt sitting EOB Min Guard A for ~45 seconds. Pt using BIL Ue to support trunk at EOB. Pt with posterior Lt lean with EOB sitting. Pt actively using Rt side to initiate sitting. Pt kicking Rt LE at EOB. Pt weight bearing through Lt UE and reaching with Rt LE. Pt reaching for socks with Min v/c. Pt sat EOB ~15 minutes. Pt opening eyes intermittently with increased arousal at EOB. Pt with focused attention demonstrated. Pt with brief sustained attention ~2-3 seconds to reaching for therapist, sqeeze Hitson, thumbs up. Pt requires verbal cues throughout session to keep arousal even at  EOB.  Bed Mobility Bed Mobility: Yes Supine to Sit: 1: +2 Total assist;Patient percentage (comment) (pt 50%) Supine to Sit Details (indicate cue type and reason): Pt able to increase assistance and performed 50% of assistance.  (A) to elevate trunk OOB with max cues for technique.  Pt needs constant redirection and cues due to focus attention. Sitting - Scoot to Edge of Bed: 2: Max assist Sitting - Scoot to [place] of Bed Details (indicate cue type and reason): (A) to scoot hips to EOB with proper weight shift to advance hips. Sit to Supine: 1: +2 Total assist;Patient percentage (comment) (0%) Scooting to HOB: 1: +2 Total assist;Patient percentage (comment) (0%) Scooting to [facility] Details (indicate cue type and reason): Pt able to maintain Rt. UE crossed over body but not Lt. Able to lift head.  Transfers Transfers: Yes Sit to Stand: 1: +2 Total assist;Patient percentage (comment);From bed;From chair/3-in-1 (pt 60%) Sit to Stand Details (indicate cue type and reason): Max tactile and VCs for proper Krauss placement and LE placement.  (A) to initiate transfer and forward translation. Stand to Sit: 1: +2 Total assist;Patient percentage (comment);To chair/3-in-1 (pt 60%) Stand to Sit Details: (A) to slowly descend to recliner with tactile cues for Stecklein placement Stand Pivot Transfers: 1: +2 Total assist;Patient percentage (comment);With armrests (pt 60%) Stand Pivot Transfer Details (indicate cue type and reason): Max VCs and manual cues for [place] and LE placement during transfer.  Pt needed manual (A) to advance hips and LE toward recliner. Ambulation/Gait Ambulation/Gait: Yes Ambulation/Gait Assistance: 1: +2 Total assist;Patient percentage (comment) (pt 60%) Ambulation/Gait Assistance Details (indicate cue type and reason): (A) to maintain balance and to redirect pt to task.  Pt continue to attempt to bend over to scratch LE during ambulation.  Pt able to walk 8' with use of RW but continued cues to keep  hands on RW.  Cues to maintain upright posture during ambulation. Ambulation Distance (Feet): 8 Feet Assistive device: Rolling walker Gait Pattern: Step-to pattern;Decreased stance time - left Balance Balance Assessed: Yes Static Sitting Balance Static Sitting - Balance Support: Feet supported;Bilateral upper extremity supported Static Sitting - Level of Assistance: 4: Min assist Static Sitting - Comment/# of Minutes: Occasional min (A) to promote midline and needed for safety due to forward lean to attempt to scratch LE.  Home Assistive Devices/Equipment:  Home Assistive Devices/Equipment Home Assistive Devices/Equipment: Crutches;Walker (specify type);Cane  Discharge Planning:  Living Arrangements: Spouse/significant other Support Systems: Spouse/significant other;Children Do you have any problems obtaining your medications?: No Type of  Residence: Private residence Home Care Services: No Case Management Consult Needed: No  Previous Home Environment:  Living Arrangements: Spouse/significant other Support Systems: Spouse/significant other;Children Do you have any problems obtaining your medications?: No Type of Residence: Private residence Home Care Services: No  Discharge Living Setting:  Plans for Discharge Living Setting: Patient's home;House;Lives with (comment) (Lives with husband) Discharge Living Setting Number of Levels: 1 Discharge Living Setting Number of Steps: 3 (Top step is a high step) Discharge Living Setting is Bedroom on Main Floor?: Yes Discharge Living Setting is Bathroom on Main Floor?: Yes  Social/Family/Support Systems:  Patient Roles: Spouse;Parent;Caregiver (Has dtr/son previous marriage, caregiver for mother/brother) Contact Information: Janeece Blok - spouse (h) [phone] (c) [phone] Anticipated Caregiver: spouse, son, dtr, sisters Anticipated Caregiver's Contact Information: Ysidro Evert (c) [phone], Clancy Gourd (c) [phone] from [place] Anda Kraft - sister (h) [phone] (c) [phone]) Ability/Limitations of Caregiver: Husband can assist, dtr/son taking FMLA to assist, sisters can help as well Caregiver Availability: 24/7 Discharge Plan Discussed with Primary Caregiver: Yes Is Caregiver In Agreement with Plan?: Yes Does Caregiver/Family have Issues with Lodging/Transportation while Pt is in Rehab?: No  Goals/Additional Needs:  Patient/Family Goal for Rehab: PT/ST S/min A, OT min/mod A goals (ELOS = 3+ weeks) Cultural Considerations: [employer]) Dietary Needs: Regular diet with thin liquids to begin 01/29 (Currently with panda tube feedings) Equipment Needs: TBD Pt/Family Agrees to Admission and willing to participate: Yes Program Orientation Provided & Reviewed with Pt/Caregiver Including Roles  & Responsibilities: Yes  Preadmission Screen Completed By:  Trish Mage, 07/06/2011 2:47 PM  Patient's condition:  This patient's condition remains as documented in the Consult dated 07/05/11, in which the Rehabilitation Physician determined and documented that the patient's condition is appropriate for intensive rehabilitative care in an inpatient rehabilitation facility.  Preadmission Screen Competed by: Roderic Palau, RN, Time/Date,1500/07/05/13.  Discussed status with Dr. Riley Kill on 07/07/11 at [phone] (time/date) and received telephone approval for admission today.  Admission Coordinator:  Trish Mage, time 0937/Date 07/07/11

## 2011-07-06 NOTE — Progress Notes (Signed)
Dr Tyson Alias states to discontinue osmolite as of now. If patient able to swallow po medication, then able to discontinue panda. Pt does not have po medications for rest of day shift. Will reassess on another shift, if appropriate.

## 2011-07-06 NOTE — Progress Notes (Signed)
Nutrition Follow-up  Patient is S/P swallow evaluation with SLP this morning; diet was upgraded to regular with thin liquids.  Diet Order:  Regular   TF continues via NG tube with Osmolite 1.2 at 55 ml/h providing 1584 kcals, 73 grams protein, 1069 ml free water daily.  Meds: Scheduled Meds:   . antiseptic oral rinse  15 mL Mouth Rinse QID  . chlorhexidine  15 mL Mouth Rinse BID  . levETIRAcetam  500 mg Per Tube BID  . pantoprazole sodium  40 mg Per Tube Q1200  . sulfamethoxazole-trimethoprim  1 tablet Oral Q12H  . DISCONTD: phenylephrine  1 drop Both Eyes Once   Continuous Infusions:   . feeding supplement (OSMOLITE 1.2 CAL) 1,000 mL (07/04/11 2305)   PRN Meds:.acetaminophen, acetaminophen-codeine, labetalol, ondansetron (ZOFRAN) IV, ondansetron, polyethylene glycol  Labs:  CMP     Component Value Date/Time   NA 142 07/05/2011 1639   K 4.0 07/05/2011 1639   CL 102 07/05/2011 1639   CO2 31 07/05/2011 1639   GLUCOSE 128* 07/05/2011 1639   BUN 16 07/05/2011 1639   CREATININE 0.63 07/05/2011 1639   CALCIUM 9.7 07/05/2011 1639   PROT 5.2* 06/23/2011 0430   ALBUMIN 3.0* 06/23/2011 0430   AST 31 06/23/2011 0430   ALT 54* 06/23/2011 0430   ALKPHOS 38* 06/23/2011 0430   BILITOT 0.9 06/23/2011 0430   GFRNONAA >90 07/05/2011 1639   GFRAA >90 07/05/2011 1639   CBG (last 3)   Basename 07/04/11 0816 07/04/11 0435 07/04/11 0030  GLUCAP 141* 127* 134*    Intake/Output Summary (Last 24 hours) at 07/06/11 1533 Last data filed at 07/06/11 1300  Gross per 24 hour  Intake    990 ml  Output   1625 ml  Net   -635 ml    Weight Status:  77.7 kg (170.9 lb) down from 179 lb 1/22  BMI=30.4  Re-estimated needs:  1400-1600 kcals, 80-90 grams protein daily  RN reports that patient consumed a piece of cake off of her lunch tray.  Family to bring in a milkshake per patient request.  Nutrition Dx:  Inadequate oral intake, ongoing.  New Goal:  Transition to Regular diet with adequate oral intake to  meet nutrition needs (>50% intake of meals and supplements)  Intervention:    Continue TF for now; can transition to nocturnal TF to help encourage intake during the day.  Osmolite 1.2 at 55 ml/h from 6 PM to 6 AM would provide 792 kcals, 37 grams protein, 541 ml free water daily (around half of estimated needs).  Ensure TID between meals to maximize oral intake.  Monitor:  PO intake, diet tolerance, weight trend.   Jacqueline Christian Pager #:  [phone]

## 2011-07-06 NOTE — Progress Notes (Signed)
VASCULAR LAB PRELIMINARY   No obvious evidence of deep vein thrombosis bilaterally. No evidence of a Baker's cyst.  Jacqueline Christian, 07/06/2011, 1:36 PM

## 2011-07-06 NOTE — Progress Notes (Signed)
Patient: Jacqueline Christian MRN: [id number] Date of admission: 06/22/2011 Date of service: 07/06/2011                                                                        PCCM PROGRESS NOTE  HPI:  66 y/o woman with a Hx of HTN admitted with dizziness and visual disturbance. 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 Subsequently Dr. Venetia Maxon (Neurosurgery) performed microdissection/debulking of hemangioma on 06/21/11 at 16:00 pm. At 21:00 the patient became unresponsive and a repeat imaging of her head demonstrated a postoperative subdural hematoma that required an emergent hematoma decompression. Patient tolerated second procedure well. PCCM was consulted for a continued ventilator management.  Antibiotics:   Cefazolin (post surgical prophylaxis only) 1/15 >> stopped Ceftriaxone 1/21 >>>off Bactrim 1/27>>>  Cultures/Sepsis Markers:   Resp 1/21 >> NOF Urine 1/27>>> staph species  Access/Protocols:  Left radial Aline 1/15>> 1/20 ETT (7.5 mm) 1/15 >> 1/21 (failed due to stridor and secretions) ETT 1/21 >>1/25 Perc trach [name] 1/25>>>  Best Practice: DVT: Coagulopathic, SCDs GI: Protonix  Subjective/ Overnight:  Trach collar continues successful, pmv successful, objective swallow excellent  Physical Exam: Filed Vitals:   07/06/11 1530  BP:   Pulse: 90  Temp:   Resp: 22    Intake/Output Summary (Last 24 hours) at 07/06/11 1558 Last data filed at 07/06/11 1300  Gross per 24 hour  Intake    990 ml  Output   1625 ml  Net   -635 ml   Vent Mode:  [-]  FiO2 (%):  [28 %] 28 %  Physical Exam: General: more awake Neuro: no longer drowsy,. Alert, appropriate CV: s1s2 rrr, distant, NSR PULM: CTA GI: abd soft, nt, +bs, tol TF Extremities:  Warm and dry, scant BLE edema    Labs:  Lab 07/05/11 1639 07/05/11 0545 07/04/11 0500  NA  142 141 139  K 4.0 3.7 3.7  CL 102 100 102  CO2 31 31 30   BUN 16 15 14   CREATININE 0.63 0.64 0.53  GLUCOSE 128* 123* 129*    Lab 07/06/11 0600 07/04/11 0500 07/03/11 0423  HGB 10.7* 10.2* 9.8*  HCT 31.9* 31.1* 29.5*  WBC 9.9 13.1* 13.1*  PLT 212 191 166    Lab 07/03/11 0423  MG 2.2   Lab Results  Component Value Date   CALCIUM 9.7 07/05/2011   PHOS 3.4 07/03/2011    CXR: Dg Chest Port 1 View  pcxr - improved atx daily   Assessment & Plan: 66 y/o female s/p resection of a meningioma who developed AMS due to an acute subdural and intracranial hemorrhage. Now s/p craniotomy and hematoma evacuation.   Acute respiratory failure -- s/p resection of meningioma with acute intracranial hemorrhage s/p craniotomy and hematoma evacuation. Failed extubation 1/21 due to stridor and secretions. Tol ATC wean s/p trach 1/25.  PLAN -  Cont ATC wean as tol PMV, cuff down, successful since early am , no pmv at night of course pcxr reviewed Stiches to remove Friday 2/1 IF she continues to progress so well, I see no reason to  downsize to 4 Secretions and airway control will be main issues of decannulation next week No ent needs as speach wnl No pcxr required in am   Meningioma, post op acute intracranial hemorrhage s/p craniotomy and hematoma evacuation.  PLAN -  - Mgmt per Dr Venetia Maxon  Hypokalemia - Normal renal function.  PLAN -  -resolved Na in future  Protein calorie malnutrition -  tol TF.  -I would consider dc TF, raises risk asp Can always replace panda in future in needed ppi slp eval swallow, diet added Dc panda after able to take meds oral  Thrombocytopenia resolved  PLAN -  -No heparin products  -SCD's for DVT prophylaxis  -f/u cbc in am   Hyperglycemia - No Hx of DM.  PLAN - ICU hyperglycemia protocol, controlled well TF  UTI? Bactrim, but UA not too impressive, urine culture staph contam likley crt while on bactrim Avoid foley when able Dc foley and eval  output again, i no urine 6-8 hrs, bladder scan and likely replace   [name]. Tyson Alias, MD, FACP Pgr: [phone]  Pulmonary & Critical Care  Nelda Bucks.   *Care during the described time interval was provided by me and/or other providers on the critical care team. I have reviewed this patient's available data, including medical history, events of note, physical examination and test results as part of my evaluation.

## 2011-07-07 ENCOUNTER — Inpatient Hospital Stay (HOSPITAL_COMMUNITY)
Admission: RE | Admit: 2011-07-07 | Discharge: 2011-07-28 | DRG: 945 | Disposition: A | Discharge status: Home Health | Payer: Medicare Other | Source: Ambulatory Visit | Attending: Physical Medicine & Rehabilitation | Admitting: Physical Medicine & Rehabilitation

## 2011-07-07 DIAGNOSIS — Z930 Tracheostomy status: Secondary | ICD-10-CM

## 2011-07-07 DIAGNOSIS — C700 Malignant neoplasm of cerebral meninges: Secondary | ICD-10-CM

## 2011-07-07 DIAGNOSIS — Z5189 Encounter for other specified aftercare: Principal | ICD-10-CM

## 2011-07-07 DIAGNOSIS — N390 Urinary tract infection, site not specified: Secondary | ICD-10-CM | POA: Diagnosis present

## 2011-07-07 DIAGNOSIS — B957 Other staphylococcus as the cause of diseases classified elsewhere: Secondary | ICD-10-CM | POA: Diagnosis present

## 2011-07-07 DIAGNOSIS — A498 Other bacterial infections of unspecified site: Secondary | ICD-10-CM | POA: Diagnosis not present

## 2011-07-07 DIAGNOSIS — D62 Acute posthemorrhagic anemia: Secondary | ICD-10-CM | POA: Diagnosis present

## 2011-07-07 DIAGNOSIS — I10 Essential (primary) hypertension: Secondary | ICD-10-CM | POA: Diagnosis present

## 2011-07-07 DIAGNOSIS — I619 Nontraumatic intracerebral hemorrhage, unspecified: Secondary | ICD-10-CM

## 2011-07-07 DIAGNOSIS — R443 Hallucinations, unspecified: Secondary | ICD-10-CM | POA: Diagnosis not present

## 2011-07-07 DIAGNOSIS — D320 Benign neoplasm of cerebral meninges: Secondary | ICD-10-CM | POA: Diagnosis present

## 2011-07-07 LAB — URINE CULTURE
Colony Count: 50000
Culture  Setup Time: 201301272348

## 2011-07-07 MED ORDER — PROMETHAZINE HCL 25 MG/ML IJ SOLN: 12.5000 mg | Freq: Four times a day (QID) | INTRAMUSCULAR | Status: DC | PRN

## 2011-07-07 MED ORDER — NITROFURANTOIN MONOHYD MACRO 100 MG PO CAPS
100.0000 mg | ORAL_CAPSULE | Freq: Two times a day (BID) | ORAL | Status: DC
  Administered 2011-07-07 – 2011-07-11 (×9): 100 mg via ORAL
  Filled 2011-07-07 (×10): qty 1

## 2011-07-07 MED ORDER — ACETAMINOPHEN 325 MG PO TABS
325.0000 mg | ORAL_TABLET | ORAL | Status: DC | PRN
  Administered 2011-07-08 – 2011-07-12 (×2): 650 mg via ORAL
  Filled 2011-07-07 (×4): qty 2

## 2011-07-07 MED ORDER — POLYETHYLENE GLYCOL 3350 17 G PO PACK
17.0000 g | PACK | Freq: Every day | ORAL | Status: DC | PRN
  Filled 2011-07-07: qty 1

## 2011-07-07 MED ORDER — ALUM & MAG HYDROXIDE-SIMETH 400-400-40 MG/5ML PO SUSP: 30.0000 mL | ORAL | Status: DC | PRN

## 2011-07-07 MED ORDER — TRAZODONE HCL 50 MG PO TABS
25.0000 mg | ORAL_TABLET | Freq: Every evening | ORAL | Status: DC | PRN
  Administered 2011-07-10 – 2011-07-27 (×11): 50 mg via ORAL
  Filled 2011-07-07 (×11): qty 1

## 2011-07-07 MED ORDER — LEVETIRACETAM 100 MG/ML PO SOLN
500.0000 mg | Freq: Two times a day (BID) | ORAL | Status: DC
  Administered 2011-07-07 – 2011-07-27 (×40): 500 mg
  Filled 2011-07-07 (×44): qty 5

## 2011-07-07 MED ORDER — OXYBUTYNIN CHLORIDE 5 MG PO TABS
5.0000 mg | ORAL_TABLET | Freq: Two times a day (BID) | ORAL | Status: DC
  Administered 2011-07-07 – 2011-07-11 (×9): 5 mg via ORAL
  Filled 2011-07-07 (×14): qty 1

## 2011-07-07 MED ORDER — FERROUS SULFATE 325 (65 FE) MG PO TABS
325.0000 mg | ORAL_TABLET | Freq: Two times a day (BID) | ORAL | Status: DC
  Administered 2011-07-07 – 2011-07-28 (×40): 325 mg via ORAL
  Filled 2011-07-07 (×43): qty 1

## 2011-07-07 MED ORDER — PROMETHAZINE HCL 12.5 MG PO TABS
12.5000 mg | ORAL_TABLET | Freq: Four times a day (QID) | ORAL | Status: DC | PRN
  Administered 2011-07-22: 12.5 mg via ORAL
  Filled 2011-07-07: qty 1

## 2011-07-07 MED ORDER — POLYETHYLENE GLYCOL 3350 17 G PO PACK
17.0000 g | PACK | Freq: Every day | ORAL | Status: DC | PRN
  Administered 2011-07-15: 17 g via ORAL
  Filled 2011-07-07: qty 1

## 2011-07-07 MED ORDER — BIOTENE DRY MOUTH MT LIQD
15.0000 mL | Freq: Four times a day (QID) | OROMUCOSAL | Status: DC
  Administered 2011-07-07 – 2011-07-27 (×44): 15 mL via OROMUCOSAL

## 2011-07-07 MED ORDER — DIPHENHYDRAMINE HCL 12.5 MG/5ML PO ELIX
12.5000 mg | ORAL_SOLUTION | Freq: Four times a day (QID) | ORAL | Status: DC | PRN
  Administered 2011-07-09: 12.5 mg via ORAL
  Administered 2011-07-10: 25 mg via ORAL
  Administered 2011-07-13: 12.5 mg via ORAL
  Administered 2011-07-22: 25 mg via ORAL
  Administered 2011-07-22: 12.5 mg via ORAL
  Filled 2011-07-07 (×8): qty 10

## 2011-07-07 MED ORDER — FLEET ENEMA 7-19 GM/118ML RE ENEM
1.0000 | ENEMA | Freq: Once | RECTAL | Status: AC | PRN
  Filled 2011-07-07: qty 1

## 2011-07-07 MED ORDER — FAMOTIDINE 20 MG PO TABS
20.0000 mg | ORAL_TABLET | Freq: Two times a day (BID) | ORAL | Status: DC
  Administered 2011-07-07 – 2011-07-28 (×42): 20 mg via ORAL
  Filled 2011-07-07 (×45): qty 1

## 2011-07-07 MED ORDER — METHYLPHENIDATE HCL 5 MG PO TABS
5.0000 mg | ORAL_TABLET | Freq: Two times a day (BID) | ORAL | Status: DC
  Administered 2011-07-08 – 2011-07-12 (×9): 5 mg via ORAL
  Filled 2011-07-07 (×9): qty 1

## 2011-07-07 MED ORDER — CHLORHEXIDINE GLUCONATE 0.12 % MT SOLN
15.0000 mL | Freq: Two times a day (BID) | OROMUCOSAL | Status: DC
  Administered 2011-07-07 – 2011-07-28 (×41): 15 mL via OROMUCOSAL
  Filled 2011-07-07 (×45): qty 15

## 2011-07-07 MED ORDER — HYDROCERIN EX CREA
TOPICAL_CREAM | Freq: Three times a day (TID) | CUTANEOUS | Status: DC | PRN
  Filled 2011-07-07: qty 113

## 2011-07-07 MED ORDER — HYDROCODONE-ACETAMINOPHEN 5-325 MG PO TABS
1.0000 | ORAL_TABLET | ORAL | Status: DC | PRN
  Administered 2011-07-08 (×2): 1 via ORAL
  Administered 2011-07-09 – 2011-07-10 (×2): 2 via ORAL
  Administered 2011-07-10: 1 via ORAL
  Administered 2011-07-11 – 2011-07-12 (×2): 2 via ORAL
  Administered 2011-07-12 – 2011-07-13 (×3): 1 via ORAL
  Administered 2011-07-13 – 2011-07-16 (×6): 2 via ORAL
  Administered 2011-07-16 (×2): 1 via ORAL
  Administered 2011-07-16 – 2011-07-18 (×3): 2 via ORAL
  Administered 2011-07-19 (×2): 1 via ORAL
  Administered 2011-07-19 – 2011-07-20 (×2): 2 via ORAL
  Administered 2011-07-21 – 2011-07-22 (×3): 1 via ORAL
  Administered 2011-07-22 – 2011-07-23 (×3): 2 via ORAL
  Administered 2011-07-24 – 2011-07-25 (×2): 1 via ORAL
  Administered 2011-07-25: 2 via ORAL
  Administered 2011-07-28: 1 via ORAL
  Filled 2011-07-07 (×4): qty 2
  Filled 2011-07-07 (×2): qty 1
  Filled 2011-07-07: qty 2
  Filled 2011-07-07: qty 1
  Filled 2011-07-07: qty 2
  Filled 2011-07-07 (×2): qty 1
  Filled 2011-07-07 (×6): qty 2
  Filled 2011-07-07: qty 1
  Filled 2011-07-07 (×2): qty 2
  Filled 2011-07-07: qty 1
  Filled 2011-07-07 (×4): qty 2
  Filled 2011-07-07 (×4): qty 1
  Filled 2011-07-07 (×2): qty 2
  Filled 2011-07-07 (×3): qty 1
  Filled 2011-07-07: qty 2
  Filled 2011-07-07: qty 1

## 2011-07-07 MED ORDER — ALUM & MAG HYDROXIDE-SIMETH 200-200-20 MG/5ML PO SUSP: 30.0000 mL | ORAL | Status: DC | PRN

## 2011-07-07 MED ORDER — BISACODYL 10 MG RE SUPP: 10.0000 mg | Freq: Every day | RECTAL | Status: DC | PRN

## 2011-07-07 MED ORDER — PROMETHAZINE HCL 12.5 MG RE SUPP: 12.5000 mg | Freq: Four times a day (QID) | RECTAL | Status: DC | PRN

## 2011-07-07 MED ORDER — GUAIFENESIN-DM 100-10 MG/5ML PO SYRP
5.0000 mL | ORAL_SOLUTION | Freq: Four times a day (QID) | ORAL | Status: DC | PRN
  Administered 2011-07-11: 10 mL via ORAL
  Filled 2011-07-07: qty 10

## 2011-07-07 MED ORDER — PANTOPRAZOLE SODIUM 40 MG PO PACK
40.0000 mg | PACK | Freq: Every day | ORAL | Status: DC
  Administered 2011-07-07 – 2011-07-08 (×2): 40 mg
  Filled 2011-07-07 (×3): qty 20

## 2011-07-07 MED ORDER — NYSTATIN 100000 UNIT/GM EX POWD
Freq: Three times a day (TID) | CUTANEOUS | Status: DC
  Administered 2011-07-07 – 2011-07-27 (×52): via TOPICAL
  Administered 2011-07-28: 2 g via TOPICAL
  Filled 2011-07-07 (×3): qty 15

## 2011-07-07 MED ORDER — NAPHAZOLINE-GLYCERIN 0.012-0.2 % OP SOLN
1.0000 [drp] | Freq: Four times a day (QID) | OPHTHALMIC | Status: DC
  Administered 2011-07-07: 2 [drp] via OPHTHALMIC
  Administered 2011-07-08: 1 [drp] via OPHTHALMIC
  Administered 2011-07-08 (×2): 2 [drp] via OPHTHALMIC
  Administered 2011-07-09 (×4): 1 [drp] via OPHTHALMIC
  Administered 2011-07-10 – 2011-07-12 (×8): 2 [drp] via OPHTHALMIC
  Administered 2011-07-12: 1 [drp] via OPHTHALMIC
  Administered 2011-07-12: 2 [drp] via OPHTHALMIC
  Administered 2011-07-13: 1 [drp] via OPHTHALMIC
  Administered 2011-07-13 (×2): 2 [drp] via OPHTHALMIC
  Administered 2011-07-14: 1 [drp] via OPHTHALMIC
  Administered 2011-07-14: 2 [drp] via OPHTHALMIC
  Administered 2011-07-14 – 2011-07-16 (×6): 1 [drp] via OPHTHALMIC
  Administered 2011-07-16 (×3): 2 [drp] via OPHTHALMIC
  Administered 2011-07-17 – 2011-07-18 (×5): 1 [drp] via OPHTHALMIC
  Administered 2011-07-19 (×2): 2 [drp] via OPHTHALMIC
  Administered 2011-07-19 – 2011-07-20 (×5): 1 [drp] via OPHTHALMIC
  Administered 2011-07-21: 2 [drp] via OPHTHALMIC
  Administered 2011-07-21: 1 [drp] via OPHTHALMIC
  Administered 2011-07-21 – 2011-07-22 (×5): 2 [drp] via OPHTHALMIC
  Administered 2011-07-22: 1 [drp] via OPHTHALMIC
  Administered 2011-07-23: 2 [drp] via OPHTHALMIC
  Administered 2011-07-23: 1 [drp] via OPHTHALMIC
  Administered 2011-07-24 – 2011-07-25 (×5): 2 [drp] via OPHTHALMIC
  Administered 2011-07-25: 1 [drp] via OPHTHALMIC
  Administered 2011-07-25 (×2): 2 [drp] via OPHTHALMIC
  Administered 2011-07-26 (×2): 1 [drp] via OPHTHALMIC
  Administered 2011-07-26: 2 [drp] via OPHTHALMIC
  Administered 2011-07-26: 1 [drp] via OPHTHALMIC
  Administered 2011-07-27 – 2011-07-28 (×4): 2 [drp] via OPHTHALMIC
  Filled 2011-07-07 (×3): qty 15

## 2011-07-07 NOTE — Progress Notes (Signed)
Report called to [employer] on 4000.  Pt transferred to [id number] with belongings and accompanied by family.  Roselie Awkward, RN

## 2011-07-07 NOTE — Progress Notes (Signed)
Occupational Therapy Treatment Patient Details Name: Jacqueline Christian MRN: [id number] DOB: 1946-05-06 Today's Date: 07/07/2011  OT Assessment/Plan OT Assessment/Plan OT Plan: Discharge plan remains appropriate OT Frequency: Min 2X/week Follow Up Recommendations: Inpatient Rehab Equipment Recommended: Defer to next venue OT Goals Acute Rehab OT Goals OT Goal Formulation: Patient unable to participate in goal setting Time For Goal Achievement: 2 weeks ADL Goals Pt Will Perform Grooming: with min assist;Supported;Sitting, chair;with cueing (comment type and amount) ADL Goal: Grooming - Progress: Progressing toward goals Pt Will Transfer to Toilet: with max assist;Stand pivot transfer;3-in-1 ADL Goal: Toilet Transfer - Progress: Progressing toward goals Miscellaneous OT Goals Miscellaneous OT Goal #2: Pt will demonstrate focused attention ~5 seconds on ADL tasks to demonstrate increased arousal and precursor to ADL tasks at sink level. OT Goal: Miscellaneous Goal #2 - Progress: Met  OT Treatment Precautions/Restrictions  Precautions Precautions: Fall Precaution Comments:  (trach collar with PMV) Restrictions Weight Bearing Restrictions: No   ADL ADL Grooming: Performed;Brushing hair;Wash/dry face;Minimal assistance Where Assessed - Grooming: Other (comment) (sitting on 3 in 1) Toilet Transfer: Performed;+2 Total assistance;Comment for patient % (60) Toilet Transfer Method: Stand pivot Toilet Transfer Equipment: Bedside commode Mobility  Bed Mobility Bed Mobility: Yes Supine to Sit:  (30) Sit to Supine: 1: +2 Total assist;Patient percentage (comment) (30) Scooting to HOB: 1: +2 Total assist;Patient percentage (comment) (0) Transfers Transfers: Yes Sit to Stand: 1: +2 Total assist;Patient percentage (comment) (60) Stand to Sit: 1: +2 Total assist;Patient percentage (comment);To chair/3-in-1 (60)  End of Session OT - End of Session Equipment Utilized During Treatment: Gait  belt Activity Tolerance: Patient tolerated treatment well Patient left: in bed;with call bell in reach Nurse Communication: Other (comment) (pt's pain) General Behavior During Session: Flat affect Cognition: Impaired  Evern Bio  07/07/2011, 12:38 PM [phone]

## 2011-07-07 NOTE — Progress Notes (Addendum)
Rehab admissions - Evaluated for possible admission.  I met with patient and numerous family members yesterday afternoon.  Husband and all family agreeable to inpatient rehab and can provide assist at home after rehab stay.  I have a bed available today and can potentially admit today if okay with MD.  I will check this morning to see if patient is medically ready for admit to inpatient rehab today.  Pager [phone]  Spoke with RN and will admit to rehab today.  Pager [phone]

## 2011-07-07 NOTE — H&P (Signed)
Physical Medicine and Rehabilitation Admission H&P  Chief complaint: Balance problems and visual difficulties  HPI: Jacqueline Christian is an 66 y.o. 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 There also appears to be encasement of the right optic nerve. This mass measures 20 x 24 x 31 mm. Patient underwent microdissection with debulking of tumor on 01/15 by Dr. Venetia Maxon. Later that day, patient with decrease in LOC with difficulty following commands. CT brain with right frontal edema and acute intraparenchymal hemorrhage 4.7 X 3.3cm with leftward shift and small amount of SDH. Patient taken to OR for right frontal crani for evacuation of ICH that pm. Post op sedated and vent dependent. Placed on decadron, keppra and IV antibiotics. Patient with difficulty with vent wean,required tracheostomy on 01/25 and currently tolerating ATC. MBS done on 01/29 and patient with some throat clearing but not aspiration or penetration. Started on regular diet. Has had complaints of BLE pain and BLE doppler done 01/29-negative for DVT. Patient with LUE weakness as well as visual deficits. Noted to have poor attention with distractibility.  Review of Systems  HENT: Positive for ear pain.  Eyes: Positive for blurred vision and double vision.  Respiratory: Negative for cough and shortness of breath.  Cardiovascular: Negative for chest pain.  Gastrointestinal: Negative for abdominal pain.  Neurological: Positive for headaches.   Past Medical History   Diagnosis  Date   .  Recurrent sinus infections      just finished erythromycin dosing 06/18/11   .  Hypertension    .  Bladder spasms      takes oxybutinin   .  Headache    .  Dizziness    .  Vision changes      r/t brain tumor   .  Arthritis    .  Depression      situational   .  Neoplasm of brain     Past Surgical History     Procedure  Date   .  Total knee arthroplasty      R   .  Abdominal hysterectomy    .  Cystectomy      L wrist   .  Stapedes surgery      repalced stapes   .  Eye surgery  1977     retina repair   .  Appendectomy    .  Craniotomy  06/22/2011     Procedure: CRANIOTOMY TUMOR EXCISION; Surgeon: Dorian Heckle, MD; Location: [facility]; Service: Neurosurgery; Laterality: Right; RIGHT pterional Craniotomy for meningioma   .  Craniotomy  06/22/2011     Procedure: CRANIOTOMY HEMATOMA EVACUATION SUBDURAL; Surgeon: Dorian Heckle, MD; Location: [facility]; Service: Neurosurgery; Laterality: Right;    No family history on file: patient unable to state.  Social History: Married. 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 Per reports she has never smoked. She does not have any smokeless tobacco history on file. Per reports that she does not drink alcohol or use illicit drugs. Husband retired and can provide assist past discharge. Reports patient with declining vision and balance problems since October and and been furniture walking.  Allergies   Allergen  Reactions   .  Morphine And Related  Other (See Comments)     Pt says it felt like her head was coming off.    Hospital Medications:   .  antiseptic oral rinse  15 mL  Mouth Rinse  QID   .  chlorhexidine  15 mL  Mouth Rinse  BID   .  levETIRAcetam  500 mg  Per Tube  BID   .  pantoprazole sodium  40 mg  Per Tube  Q1200   .  sulfamethoxazole-trimethoprim  1 tablet  Oral  Q12H   .  DISCONTD: feeding supplement  237 mL  Oral  TID BM    No current outpatient prescriptions on file as of 07/07/2011.   Home:  Home Living  Lives With: Spouse  Functional History:  Prior Function  Level of Independence: Other (comment) (unknow at this time no family present)  Functional Status:  Mobility:  Bed Mobility  Bed Mobility: Yes  Supine to Sit: 1: +2 Total assist;Patient percentage (comment) (pt 50%)  Supine to Sit Details (indicate cue  type and reason): Pt able to increase assistance and performed 50% of assistance. (A) to elevate trunk OOB with max cues for technique. Pt needs constant redirection and cues due to focus attention.  Sitting - Scoot to Edge of Bed: 2: Max assist  Sitting - Scoot to [place] of Bed Details (indicate cue type and reason): (A) to scoot hips to EOB with proper weight shift to advance hips.  Sit to Supine: 1: +2 Total assist;Patient percentage (comment) (0%)  Scooting to HOB: 1: +2 Total assist;Patient percentage (comment) (0%)  Scooting to [facility] Details (indicate cue type and reason): Pt able to maintain Rt. UE crossed over body but not Lt. Able to lift head.  Transfers  Transfers: Yes  Sit to Stand: 1: +2 Total assist;Patient percentage (comment);From bed;From chair/3-in-1 (pt 60%)  Sit to Stand Details (indicate cue type and reason): Max tactile and VCs for proper Salva placement and LE placement. (A) to initiate transfer and forward translation.  Stand to Sit: 1: +2 Total assist;Patient percentage (comment);To chair/3-in-1 (pt 60%)  Stand to Sit Details: (A) to slowly descend to recliner with tactile cues for [place] placement  Stand Pivot Transfers: 1: +2 Total assist;Patient percentage (comment);With armrests (pt 60%)  Stand Pivot Transfer Details (indicate cue type and reason): Max VCs and manual cues for [place] and LE placement during transfer. Pt needed manual (A) to advance hips and LE toward recliner.  Ambulation/Gait  Ambulation/Gait: Yes  Ambulation/Gait Assistance: 1: +2 Total assist;Patient percentage (comment) (pt 60%)  Ambulation/Gait Assistance Details (indicate cue type and reason): (A) to maintain balance and to redirect pt to task. Pt continue to attempt to bend over to scratch LE during ambulation. Pt able to walk 8' with use of RW but continued cues to keep hands on RW. Cues to maintain upright posture during ambulation.  Ambulation Distance (Feet): 8 Feet  Assistive device: Rolling walker    Gait Pattern: Step-to pattern;Decreased stance time - left   ADL:  ADL  Eating/Feeding: NPO  Where Assessed - Eating/Feeding: (Panda)  Grooming: Performed;Wash/dry face;Moderate assistance  Grooming Details (indicate cue type and reason): pt rubbing eyes with Lt UE and provided wash cloth. pt provided max v/c to wash face. Pt following commands intermittently less than 50% of times. Pt washing eyes with wash cloth without command. Pt using Lt Ue feeling tubing of panda tube and RN (paul) made aware that pt touching and feeling tubing. Pt has not pulled tube yet to attempt removal during our session. Green mitten placed at this time as precaution.  Where Assessed - Grooming: Sitting, chair;Supported  Location manager Bathing: Not assessed  Lower Body Bathing: Not assessed  Upper Body Dressing: Not assessed  Lower Body Dressing: +1 Total assistance  Lower Body Dressing Details (indicate cue type and reason): doff prafo and don socks. Pt lifting Rt LE to command to don sock  Where Assessed - Lower Body Dressing: Sitting, bed;Supported  Ambulation Related to ADLs: none  ADL Comments: Pt sitting EOB Min Guard A for ~45 seconds. Pt using BIL Ue to support trunk at EOB. Pt with posterior Lt lean with EOB sitting. Pt actively using Rt side to initiate sitting. Pt kicking Rt LE at EOB. Pt weight bearing through Lt UE and reaching with Rt LE. Pt reaching for socks with Min v/c. Pt sat EOB ~15 minutes. Pt opening eyes intermittently with increased arousal at EOB. Pt with focused attention demonstrated. Pt with brief sustained attention ~2-3 seconds to reaching for therapist, sqeeze [place], thumbs up. Pt requires verbal cues throughout session to keep arousal even at EOB.  Cognition:  Cognition  Overall Cognitive Status: Impaired  Arousal/Alertness: Awake/alert  Orientation Level: Oriented to person  Attention: Focused  Focused Attention: Impaired  Focused Attention Impairment: Verbal basic;Functional basic   Memory: (Unable to assess)  Awareness: Impaired  Awareness Impairment: Intellectual impairment;Emergent impairment  Problem Solving: Impaired  Problem Solving Impairment: Verbal basic;Functional basic  Executive Function: Initiating  Initiating: Impaired  Initiating Impairment: Verbal basic;Functional basic  Behaviors: Restless  Safety/Judgment: Impaired  Cognition  Arousal/Alertness: Awake/alert  Overall Cognitive Status: Difficult to assess  Difficult to assess due to: intubated;level of arousal  Orientation Level: Oriented to person  Following Commands: Follows one step commands consistently (pt require command repeated but completed simple task)  Cognition - Other Comments: pt required repetition of commands question due to delayed progessing / ability to hear therapist / cognitive impairment. Pt completed simple commands 2 out 2 trials for all.  Blood pressure 108/80, pulse 75, temperature 98.4 F (36.9 C), temperature source Oral, resp. rate 16, height 5\' 3"  (1.6 m), weight 77.7 kg (171 lb 4.8 oz), SpO2 100.00%.  Physical Exam  Constitutional: She is well-developed, well-nourished, and in no distress.  HENT:  Head: Normocephalic. Scalp wound clean and intact without drainage. Eyes: Pupils are equal, round, and reactive to light.  Neck:  #6 cuffed trach with sutures in place. Able to speak through the valve. Volume is poor however Cardiovascular: Normal rate and regular rhythm.  Pulmonary/Chest: Effort normal.  Abdominal: Soft. Bowel sounds are normal.  Neurological: She is alert. A cranial nerve deficit is present.  Oriented to self , place " Cataract", not to situation. Delayed processing with slow speech. Easily distracted needing frequent redirection. Left inattention and ? Peripheral field deficits. LUE weakness and left lower extremity weakness however a lot of this is inattention.  She may have sensory deficits on the left but does withdraw to pain in both the arm and  leg.  Poor awareness.  Skin: Skin is warm and dry except for perineal rash.Marland Kitchen  Psychiatric: Her affect is blunt. She expresses impulsivity.   Results for orders placed during the hospital encounter of 06/22/11 (from the past 48 hour(s))   BASIC METABOLIC PANEL Status: Abnormal    Collection Time    07/05/11 4:39 PM   Component  Value  Range  Comment    Sodium  142  135 - 145 (mEq/L)     Potassium  4.0  3.5 - 5.1 (mEq/L)     Chloride  102  96 - 112 (mEq/L)  CO2  31  19 - 32 (mEq/L)     Glucose, Bld  128 (*)  70 - 99 (mg/dL)     BUN  16  6 - 23 (mg/dL)     Creatinine, Ser  1.61  0.50 - 1.10 (mg/dL)     Calcium  9.7  8.4 - 10.5 (mg/dL)     GFR calc non Af Amer  >90  >90 (mL/min)     GFR calc Af Amer  >90  >90 (mL/min)    CBC Status: Abnormal    Collection Time    07/06/11 6:00 AM   Component  Value  Range  Comment    WBC  9.9  4.0 - 10.5 (K/uL)     RBC  3.53 (*)  3.87 - 5.11 (MIL/uL)     Hemoglobin  10.7 (*)  12.0 - 15.0 (g/dL)     HCT  09.6 (*)  04.5 - 46.0 (%)     MCV  90.4  78.0 - 100.0 (fL)     MCH  30.3  26.0 - 34.0 (pg)     MCHC  33.5  30.0 - 36.0 (g/dL)     RDW  40.9  81.1 - 15.5 (%)     Platelets  212  150 - 400 (K/uL)    DIFFERENTIAL Status: Normal    Collection Time    07/06/11 6:00 AM   Component  Value  Range  Comment    Neutrophils Relative  75  43 - 77 (%)     Neutro Abs  7.4  1.7 - 7.7 (K/uL)     Lymphocytes Relative  13  12 - 46 (%)     Lymphs Abs  1.3  0.7 - 4.0 (K/uL)     Monocytes Relative  6  3 - 12 (%)     Monocytes Absolute  0.6  0.1 - 1.0 (K/uL)     Eosinophils Relative  5  0 - 5 (%)     Eosinophils Absolute  0.5  0.0 - 0.7 (K/uL)     Basophils Relative  1  0 - 1 (%)     Basophils Absolute  0.1  0.0 - 0.1 (K/uL)     Dg Chest Port 1 View  07/06/2011 *RADIOLOGY REPORT* Clinical Data: Evaluate endotracheal tube position. PORTABLE CHEST - 1 VIEW Comparison: 07/03/2011 Findings: Tracheostomy appropriately positioned. A feeding tube extends beyond the  inferior aspect of the film. Normal heart size. No pleural effusion or pneumothorax. Low lung volumes. Mild patchy left base atelectasis. IMPRESSION: No change in patchy left sided atelectasis. No acute findings. Original Report Authenticated By: Consuello Bossier, M.D.  Dg Swallowing Func-no Report  07/06/2011 CLINICAL DATA: dysphagia FLUOROSCOPY FOR SWALLOWING FUNCTION STUDY: Fluoroscopy was provided for swallowing function study, which was administered by a speech pathologist. Final results and recommendations from this study are contained within the speech pathology report.   Post Admission Physician Evaluation:  1. Functional deficits secondary to meningioma status post resection with postoperative intracranial hemorrhage. 2. Patient is admitted to receive collaborative, interdisciplinary care between the physiatrist, rehab nursing staff, and therapy team. 3. Patient's level of medical complexity and substantial therapy needs in context of that medical necessity cannot be provided at a lesser intensity of care such as a SNF. 4. Patient has experienced substantial functional loss from his/her baseline which was documented above under the "Functional History" and "Functional Status" headings. Judging by the patient's diagnosis, physical exam, and functional history, the patient has potential  for functional progress which will result in measurable gains while on inpatient rehab. These gains will be of substantial and practical use upon discharge in facilitating mobility and self-care at the household level. 5. Physiatrist will provide 24 hour management of medical needs as well as oversight of the therapy plan/treatment and provide guidance as appropriate regarding the interaction of the two. 6. 24 hour rehab nursing will assist with bladder management, bowel management, safety, skin/wound care, disease management, medication administration, pain management and patient education and help integrate therapy  concepts, techniques,education, etc. 7. PT will assess and treat for: Lower extremity strength, neuromuscular reeducation, cognitive perceptual training, visual perceptual training, adaptive equipment, gait. Goals are: Supervision to minimal assistance. 8. OT will assess and treat for: Upper extremity strength, ADLs, neuromuscular reeducation, visual perceptual training, cognitive perceptual training, adaptive equipment and family had. Goals are: Supervision to minimal assistance. 9. SLP will assess and treat for: Cognition, swallowing, language and speech quality. Goals are: Supervision to minimal assistance. 10. Case Management and Social Worker will assess and treat for psychological issues and discharge planning. 11. Team conference will be held weekly to assess progress toward goals and to determine barriers to discharge. 12. Patient will receive at least 3 hours of therapy per day at least 5 days per week. 13. ELOS and Prognosis: 3 weeks excellent Medical Problem List and Plan:  1. DVT Prophylaxis/Anticoagulation: Mechanical: Sequential compression devices, below knee Bilateral lower extremities. BLE doppler negative.  2. Pain Management: Unable to determine due to cognitive status. Denies pain currently. ?Intermittent headaches per family.  3. Mood: Currently with flat affect and poor awareness. Will have LSW follow up with patient and husband for evaluation/support.  4. Sphenoid wing meningioma with vision loss- left crani for debulking/resection of tumor.  5. Resection of tumor with right frontal ICH- on keppra for seizure prophylaxis. Add ritalin to help with attention and initiation based on initiation levels with therapy.  -Normalize sleep cycle. 6. Respiratory failure requiring trach: trach sutures to be D/C Friday per PCCM. Downsize trach tomorrow to #4 if she has a reasonable night 7. Staph UTI: change antibiotic to macrodantin due to sensitivites. D# 1/5  8. ABLA: Add iron  supplement. Recheck in am.  9. HTN: Off tenormin currenty as blood pressures occasionally low. Monitor with bid checks and resume if blood pressures remain elevated.

## 2011-07-07 NOTE — Progress Notes (Signed)
Patient discussed at the Long Length of Stay Jacqueline Christian Weeks 07/07/2011  

## 2011-07-07 NOTE — Discharge Summary (Signed)
Physician Discharge Summary  Patient ID: Jacqueline Christian MRN: [id number] DOB/AGE: Nov 27, 1945 66 y.o.  Admit date: 06/22/2011 Discharge date: 07/07/2011  Admission Diagnoses:Sphenoid wing meningioma with vision loss  Discharge Diagnoses:  Left hemiparesis S/p tracheostomy for respiratory failure S/p Right frontal ICH  Discharged Condition: fair  Hospital Course: Uncomplicated gross total resection of meningioma followed by obtundation and right frontal ICH resulting in return to OR and prolonged ICU stay.  Patient developed upper airway obstruction requiring reintubation and then tracheostomy.  Patient had pneumonia.  Now improving and beginning to ambulate, recovering left sided strength and speaking with improving level of alertness and cognition.  Likely to benefit from Rehab stay  Consults: pulmonary/intensive care  Significant Diagnostic Studies: MRI, surgery, CT scans  Treatments: surgery: Craniotomy followed by tracheostomy  Discharge Exam: Blood pressure 108/80, pulse 75, temperature 98.4 F (36.9 C), temperature source Oral, resp. rate 16, height 5\' 3"  (1.6 m), weight 77.7 kg (171 lb 4.8 oz), SpO2 100.00%. Awake, alert, states name, following complex commands, improving left hemiparesis.  Incision CDI  Disposition:    Medication List  As of 07/07/2011  9:36 AM   ASK your doctor about these medications         atenolol 25 MG tablet   Commonly known as: TENORMIN   Take 25 mg by mouth daily.      cetirizine 10 MG tablet   Commonly known as: ZYRTEC   Take 10 mg by mouth daily as needed. For allergies      dexamethasone 4 MG tablet   Commonly known as: DECADRON   Take 4 mg by mouth 4 (four) times daily.      famotidine 20 MG tablet   Commonly known as: PEPCID   Take 20 mg by mouth 2 (two) times daily.      naproxen sodium 220 MG tablet   Commonly known as: ANAPROX   Take 440 mg by mouth daily as needed. For headache.      oxybutynin 5 MG tablet   Commonly known  as: DITROPAN   Take 5 mg by mouth 2 (two) times daily.             Signed: Aleane Wesenberg D 07/07/2011, 9:36 AM

## 2011-07-07 NOTE — Progress Notes (Signed)
Patient admitted onto rehab room 4033 accompanied by family, admission history obtained and questions answered. Safety plan and agreement discussed family verbalized understanding.

## 2011-07-07 NOTE — Progress Notes (Signed)
Subjective: Patient reports feeling better, speaking, ready for rehab  Objective: Vital signs in last 24 hours: Temp:  [98.2 F (36.8 C)-98.9 F (37.2 C)] 98.9 F (37.2 C) (01/30 0318) Pulse Rate:  [72-105] 75  (01/30 0427) Resp:  [15-22] 16  (01/30 0427) BP: (98-133)/(42-70) 104/42 mmHg (01/30 0318) SpO2:  [93 %-100 %] 100 % (01/30 0427) FiO2 (%):  [28 %] 28 % (01/30 0427) Weight:  [77.7 kg (171 lb 4.8 oz)] 77.7 kg (171 lb 4.8 oz) (01/30 0318)  Intake/Output from previous day: 01/29 0701 - 01/30 0700 In: 985 [P.O.:240; NG/GT:745] Out: 1525 [Urine:1525] Intake/Output this shift:    Physical Exam: Left hemiparesis persists, but improving.  Speaking with trach  Lab Results:  Basename 07/06/11 0600  WBC 9.9  HGB 10.7*  HCT 31.9*  PLT 212   BMET  Basename 07/05/11 1639 07/05/11 0545  NA 142 141  K 4.0 3.7  CL 102 100  CO2 31 31  GLUCOSE 128* 123*  BUN 16 15  CREATININE 0.63 0.64  CALCIUM 9.7 9.5    Studies/Results: Dg Chest Port 1 View  07/06/2011  *RADIOLOGY REPORT*  Clinical Data: Evaluate endotracheal tube position.  PORTABLE CHEST - 1 VIEW  Comparison: 07/03/2011  Findings: Tracheostomy appropriately positioned.  A feeding tube extends beyond the  inferior aspect of the film.  Normal heart size.  No pleural effusion or pneumothorax.  Low lung volumes. Mild patchy left base atelectasis.  IMPRESSION: No change in patchy left sided atelectasis. No acute findings.  Original Report Authenticated By: Consuello Bossier, M.D.   Dg Swallowing Func-no Report  07/06/2011  CLINICAL DATA: dysphagia   FLUOROSCOPY FOR SWALLOWING FUNCTION STUDY:  Fluoroscopy was provided for swallowing function study, which was  administered by a speech pathologist.  Final results and recommendations  from this study are contained within the speech pathology report.      Assessment/Plan: Transfer to Rehab    LOS: 15 days    Tenzin Edelman D, MD 07/07/2011, 7:20 AM

## 2011-07-08 DIAGNOSIS — C700 Malignant neoplasm of cerebral meninges: Secondary | ICD-10-CM

## 2011-07-08 DIAGNOSIS — Z5189 Encounter for other specified aftercare: Secondary | ICD-10-CM

## 2011-07-08 DIAGNOSIS — I619 Nontraumatic intracerebral hemorrhage, unspecified: Secondary | ICD-10-CM

## 2011-07-08 LAB — CBC
HCT: 32.9 % — ABNORMAL LOW (ref 36.0–46.0)
MCH: 29.8 pg (ref 26.0–34.0)
MCHC: 32.8 g/dL (ref 30.0–36.0)
MCV: 90.6 fL (ref 78.0–100.0)
Platelets: 265 10*3/uL (ref 150–400)
RDW: 13.5 % (ref 11.5–15.5)
WBC: 8.3 10*3/uL (ref 4.0–10.5)

## 2011-07-08 LAB — DIFFERENTIAL
Basophils Relative: 1 % (ref 0–1)
Lymphocytes Relative: 16 % (ref 12–46)
Monocytes Absolute: 0.7 10*3/uL (ref 0.1–1.0)
Monocytes Relative: 8 % (ref 3–12)
Neutro Abs: 5.9 10*3/uL (ref 1.7–7.7)
Neutrophils Relative %: 71 % (ref 43–77)

## 2011-07-08 LAB — COMPREHENSIVE METABOLIC PANEL
AST: 18 U/L (ref 0–37)
Albumin: 3.2 g/dL — ABNORMAL LOW (ref 3.5–5.2)
BUN: 13 mg/dL (ref 6–23)
Calcium: 9.9 mg/dL (ref 8.4–10.5)
Chloride: 103 mEq/L (ref 96–112)
Creatinine, Ser: 0.73 mg/dL (ref 0.50–1.10)
Total Bilirubin: 0.4 mg/dL (ref 0.3–1.2)
Total Protein: 7 g/dL (ref 6.0–8.3)

## 2011-07-08 MED ORDER — ENSURE CLINICAL ST REVIGOR PO LIQD
237.0000 mL | Freq: Every day | ORAL | Status: DC
  Administered 2011-07-09 – 2011-07-14 (×6): 237 mL via ORAL

## 2011-07-08 NOTE — Plan of Care (Signed)
Problem: RH Vision Goal: RH LTG Vision (Specify) Pt will visual attend to left side with min cuing during Basic ADLs.

## 2011-07-08 NOTE — Progress Notes (Signed)
INITIAL ADULT NUTRITION ASSESSMENT Date: 07/08/2011   Time: 10:28 AM  Reason for Assessment: Low Braden  ASSESSMENT: Female 66 y.o.  Dx: s/p dissection with debulking of R-sided anterior clinoid based meningioma  Hx:  Past Medical History  Diagnosis Date  . Recurrent sinus infections     just finished erythromycin dosing 06/18/11  . Hypertension   . Bladder spasms     takes oxybutinin  . Headache   . Dizziness   . Vision changes     r/t brain tumor  . Arthritis   . Depression     situational  . Neoplasm of brain    Related Meds:     . antiseptic oral rinse  15 mL Mouth Rinse QID  . chlorhexidine  15 mL Mouth Rinse BID  . famotidine  20 mg Oral BID  . ferrous sulfate  325 mg Oral BID  . levETIRAcetam  500 mg Per Tube BID  . methylphenidate  5 mg Oral BID  . naphazoline-glycerin  1-2 drop Both Eyes QID  . nitrofurantoin (macrocrystal-monohydrate)  100 mg Oral Q12H  . nystatin   Topical TID  . oxybutynin  5 mg Oral BID  . pantoprazole sodium  40 mg Per Tube Q1200   Ht: 5\' 3"  (160 cm)  Wt: 171 lb (77.565 kg)  Ideal Wt: 52.3 kg % Ideal Wt: 148%  Usual Wt: 80.6 kg on 1/17 % Usual Wt: 96%  Body mass index is 30.29 kg/(m^2). Meets criteria for Obesity Class I.  Food/Nutrition Related Hx: unable to obtain at this time  Labs:  CMP     Component Value Date/Time   NA 141 07/08/2011 0625   K 3.8 07/08/2011 0625   CL 103 07/08/2011 0625   CO2 27 07/08/2011 0625   GLUCOSE 103* 07/08/2011 0625   BUN 13 07/08/2011 0625   CREATININE 0.73 07/08/2011 0625   CALCIUM 9.9 07/08/2011 0625   PROT 7.0 07/08/2011 0625   ALBUMIN 3.2* 07/08/2011 0625   AST 18 07/08/2011 0625   ALT 37* 07/08/2011 0625   ALKPHOS 102 07/08/2011 0625   BILITOT 0.4 07/08/2011 0625   GFRNONAA 87* 07/08/2011 0625   GFRAA >90 07/08/2011 0625   Intake/Output: I/O last 3 completed shifts: In: 120 [P.O.:120] Out: 2350 [Urine:2350] Total I/O In: 240 [P.O.:240] Out: -   Diet Order:  General  Supplements/Tube Feeding: none  IVF:    Estimated Nutritional Needs:   Kcal:  1550 - 1700 kcal Protein:  78 - 90 grams Fluid:  1.6 - 1.8 L/d  Pt is s/p dissection with debulking of R-sided anterior clinoid based meningioma. Post-op, pt sedated and vent dependent; difficulty with vent wean, required trach on 1/25. Pt received TF of Osmolite 1.2 at 55 ml/hr during acute hospitalization. MBS on 1/29, recommending Regular diet. Intake since admission has been 50-100%. Eating well at this time.   NUTRITION DIAGNOSIS: Increased energy expenditure  RELATED TO: participation in rehab activities  AS EVIDENCE BY: estimated needs.  MONITORING/EVALUATION(Goals): Goal: Pt to consume >/= 75% of meals. Met. Monitor: PO intake, weights, labs, I/O's  EDUCATION NEEDS: -No education needs identified at this time  INTERVENTION: 1. Ensure Clinical Strength PO daily between meals 2. RD to follow nutrition care plan  Dietitian #: [phone]  DOCUMENTATION CODES Per approved criteria  -Obesity Unspecified    Adair Laundry 07/08/2011, 10:28 AM

## 2011-07-08 NOTE — Plan of Care (Signed)
Overall Plan of Care [facility]) Patient Details Name: Jacqueline Christian MRN: [id number] DOB: 04/11/1946  Diagnosis:  Meningioma status post resection  Primary Diagnosis:    <principal problem not specified> Co-morbidities: Intracranial hemorrhage, respiratory failure  Functional Problem List  Patient demonstrates impairments in the following areas: Balance, Behavior, Cognition, Endurance, Linguistic, Motor, Perception, Safety, Sensory  and Vision  Basic ADL's: eating, grooming, bathing, dressing and toileting Advanced ADL's: simple meal preparation  Transfers:  bed mobility, bed to chair, toilet, tub/shower, car, furniture and floor Locomotion:  ambulation and stairs  Additional Impairments:  Functional use of upper extremity, Communication  comprehension and expression, Social Cognition   social interaction, problem solving, memory, attention and awareness and Leisure Awareness  Anticipated Outcomes Item Anticipated Outcome  Eating/Swallowing  Min A  Basic self-care  supervision  Tolieting  supervision  Bowel/Bladder    Transfers  supervision  Locomotion    Communication  Min A  Cognition  Min A  Pain    Safety/Judgment  Min A  Other     Therapy Plan: PT Frequency: 2-3 X/day, 60-90 minutes OT Frequency: 1-2 X/day, 60-90 minutes SLP Frequency: 1-2 X/day, 30-60 minutes   Team Interventions: Item RN PT OT SLP SW TR Other  Self Care/Advanced ADL Retraining   x      Neuromuscular Re-Education  x x      Therapeutic Activities  x x x     UE/LE Strength Training/ROM  x x      UE/LE Coordination Activities  x x      Visual/Perceptual Remediation/Compensation  x x      DME/Adaptive Equipment Instruction  x x      Therapeutic Exercise  x x      Balance/Vestibular Training  x x      Patient/Family Education  x x x     Cognitive Remediation/Compensation  x x x     Functional Mobility Training  x x      Ambulation/Gait Training  x       Statistician Reintegration  x x      Dysphagia/Aspiration Film/video editor    x     Bladder Management         Bowel Management         Disease Management/Prevention         Pain Management         Medication Management         Skin Care/Wound Management         Splinting/Orthotics         Discharge Planning    x x    Psychosocial Support     x                       Team Discharge Planning: Destination:  Home Projected Follow-up:  PT, OT, SLP and Home Health Projected Equipment Needs: ? Walker Patient/family involved in discharge planning:  Yes  MD ELOS: 3-4 weeks Medical Rehab Prognosis:  Good Assessment: Patient has been admitted for inpatient rehabilitation. Patient will work with physical therapy occupational therapy and speech-language pathology to address deficits in cognition, mobility, self-care, swallowing and communication, adaptive equipment use and exercise tolerance. Patient's progress will be complicated by  her arousal status. Ritalin has been initiated to improve patient's attention and focus. Critical care medicine is follow along regarding patient's respiratory status. She currently still has a trach in place which will be potentially removed at the beginning of next week. Goals are set at min assist to supervision.

## 2011-07-08 NOTE — Evaluation (Addendum)
Speech Language Pathology Assessment and Plan, PMSV & BSE  Patient Details  Name: Jacqueline Christian MRN: [id number] Date of Birth: 29-Dec-1945  SLP Diagnosis: cognitive impairment, speech impairment  Rehab Potential: good ELOS: 3 weeks  Today's Date: 07/08/2011 Time: 1000-1100 Time Calculation (min): 60 min  Problem List: There is no problem list on file for this patient.   Past Medical History:  Past Medical History  Diagnosis Date  . Recurrent sinus infections     just finished erythromycin dosing 06/18/11  . Hypertension   . Bladder spasms     takes oxybutinin  . Headache   . Dizziness   . Vision changes     r/t brain tumor  . Arthritis   . Depression     situational  . Neoplasm of brain    Past Surgical History:  Past Surgical History  Procedure Date  . Total knee arthroplasty     R  . Abdominal hysterectomy   . Cystectomy     L wrist  . Stapedes surgery     repalced stapes  . Eye surgery 1977    retina repair  . Appendectomy   . Craniotomy 06/22/2011    Procedure: CRANIOTOMY TUMOR EXCISION;  Surgeon: Dorian Heckle, MD;  Location: [facility];  Service: Neurosurgery;  Laterality: Right;  RIGHT pterional Craniotomy for meningioma  . Craniotomy 06/22/2011    Procedure: CRANIOTOMY HEMATOMA EVACUATION SUBDURAL;  Surgeon: Dorian Heckle, MD;  Location: [facility];  Service: Neurosurgery;  Laterality: Right;    Skilled Therapeutic Intervention: Administered PMSV evaluation, BSE and cognitive linguistic evaluation. Please see below for details.  Short Term Goals:   Assessment & Plan Clinical Impression: 66 y.o. 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 There also appears to be encasement of the right optic nerve. This mass measures 20 x 24 x 31 mm. Patient underwent microdissection with debulking of tumor on 01/15 by Dr. Venetia Maxon.  Later that day, patient with decrease in LOC with difficulty following commands. CT brain with right frontal edema and acute intraparenchymal hemorrhage 4.7 X 3.3cm with leftward shift and small amount of SDH. Patient taken to OR for right frontal crani for evacuation of ICH that pm. Post op sedated and vent dependent. Placed on decadron, keppra and IV antibiotics. Patient with difficulty with vent wean,required tracheostomy on 01/25 and currently tolerating ATC. MBS done on 01/29 and patient with some throat clearing but not aspiration or penetration. Started on regular diet. Pt transferred to CIR on 07/07/11 and presents with severe cognitive impairments characterized by decreased verbal and functional initiation, left attention, sustained attention, and problem solving. Pt currently tolerating her PMSV during all waking hours without distress. Pt would benefit from skilled SLP services to maximize cognitive function and overall independence.    SLP - End of Session Patient left: in chair;with call bell in reach Nurse Communication: Diet recommendation Assessment SLP Recommendation/Assessment: Patient will need skilled Speech Lanaguage Pathology Services during CIR admission Rehab Potential: Good Barriers to Discharge: None (Son & daughter to assist husband with 24 hr @) Therapy Diagnosis: Cognitive Impairments;Speech and Language deficits SLP Plan SLP Frequency: 1-2 X/day, 30-60 minutes SLP Treatment/Interventions: Cognitive remediation/compensation;Internal/external aids;Therapeutic Activities;Patient/family education;Cueing hierarchy;Multimodal communication approach;Environmental controls;Functional tasks;Speech/Language facilitation Recommendation Follow up Recommendations: Home Health SLP Equipment Recommended: None recommended by SLP  SLP Evaluation Precautions/Restrictions  Precautions Precautions: Fall Precaution Comments: trach  care Required Braces or Orthoses:  No Restrictions Weight Bearing Restrictions: No General  Chart Reviewed: Yes Pain Pain Assessment Pain Assessment: No/denies pain Prior Functioning Cognitive/Linguistic Baseline: Within functional limits Type of Home: House Lives With: Spouse Receives Help From: Family Cognition Overall Cognitive Status: Impaired Arousal/Alertness: Lethargic Orientation Level: Oriented to person (reported she has a brain tumor) Attention: Focused;Sustained Focused Attention: Impaired Focused Attention Impairment: Verbal basic;Functional basic Sustained Attention: Impaired Sustained Attention Impairment: Verbal basic;Functional basic Memory: Impaired Memory Impairment: Decreased recall of new information;Decreased short term memory Decreased Short Term Memory: Verbal basic;Functional basic Awareness: Impaired Awareness Impairment: Intellectual impairment Problem Solving: Impaired Problem Solving Impairment: Verbal basic;Functional basic Executive Function: Initiating Initiating: Impaired Initiating Impairment: Verbal basic;Functional basic Behaviors: Perseveration Safety/Judgment: Impaired Comprehension Auditory Comprehension Overall Auditory Comprehension: Impaired Other Conversation Comments: Pt reposnded to ~50% of questions. Pt answered yes/no biorgraphical questions correctly and verbalized the names of her children and husband. Deficits impacted by attention and iniation.  Interfering Components: Attention;Processing speed EffectiveTechniques: Extra processing time;Repetition;Visual/Gestural cues Reading Comprehension Reading Status: Not tested Expression Expression Primary Mode of Expression: Verbal Verbal Expression Overall Verbal Expression: Impaired Initiation: Impaired Automatic Speech: Social Response Level of Generative/Spontaneous Verbalization: Word;Phrase Naming: No impairment Pragmatics: Impairment Impairments: Abnormal affect;Turn Taking;Eye contact Interfering  Components: Attention Effective Techniques: Open ended questions (about biographical information) Other Verbal Expression Comments: Pt will initiate basic needs. "I need to go to the bathroom."  Written Expression Written Expression: Not tested Oral/Motor Oral Motor/Sensory Function Overall Oral Motor/Sensory Function: Impaired Labial ROM: Reduced left Labial Symmetry: Within Functional Limits Labial Strength: Reduced Lingual ROM:  (generalized weakness/poor effort) Lingual Symmetry: Within Functional Limits Lingual Strength: Reduced Facial ROM: Within Functional Limits Facial Symmetry: Within Functional Limits Facial Strength: Within Functional Limits Facial Sensation: Within Functional Limits Velum: Within Functional Limits Mandible: Within Functional Limits   Recommendations for other services: None  Discharge Criteria: Patient will be discharged from SLP if patient refuses treatment 3 consecutive times without medical reason, if treatment goals not met, if there is a change in medical status, if patient makes no progress towards goals or if patient is discharged from hospital.  The above assessment, treatment plan, treatment alternatives and goals were discussed and mutually agreed upon: by patient  Kimbria Camposano 07/08/2011, 4:46 PM  Clinical/Bedside Swallow Evaluation Patient Details  Name: Jacqueline Christian MRN: [id number] DOB: February 21, 1946 Today's Date: 07/08/2011  Past Medical History:  Past Medical History  Diagnosis Date  . Recurrent sinus infections     just finished erythromycin dosing 06/18/11  . Hypertension   . Bladder spasms     takes oxybutinin  . Headache   . Dizziness   . Vision changes     r/t brain tumor  . Arthritis   . Depression     situational  . Neoplasm of brain    Past Surgical History:  Past Surgical History  Procedure Date  . Total knee arthroplasty     R  . Abdominal hysterectomy   . Cystectomy     L wrist  . Stapedes surgery      repalced stapes  . Eye surgery 1977    retina repair  . Appendectomy   . Craniotomy 06/22/2011    Procedure: CRANIOTOMY TUMOR EXCISION;  Surgeon: Dorian Heckle, MD;  Location: [facility];  Service: Neurosurgery;  Laterality: Right;  RIGHT pterional Craniotomy for meningioma  . Craniotomy 06/22/2011    Procedure: CRANIOTOMY HEMATOMA EVACUATION SUBDURAL;  Surgeon: Dorian Heckle, MD;  Location: [facility];  Service: Neurosurgery;  Laterality: Right;   HPI:  Transferred to CIR 07/07/11. BSE today to assess swallow safety with current diet.    Assessment/Recommendations/Treatment Plan  SLP Assessment Clinical Impression Statement: Pt with throat clear X 1 but no other overt s/s of aspiration noted with thin liquids via straw. RN reports pt is demonstrating efficient PO intake and NG tuve removed today. Pt  did not want to pariticpate in trials of solids textures depspite multiple attemtps. Recommended to coninue with recommended diet of regfular textures and thin liquids.  Risk for Aspiration: Mild Other Related Risk Factors: Decreased respiratory status;Cognitive impairment;Tracheostomy  Swallow Evaluation Recommendations Solid Consistency: Regular Liquid Consistency: Thin Medication Administration: Crushed with puree Supervision: Full supervision/cueing for compensatory strategies Compensations: Slow rate;Small sips/bites Postural Changes and/or Swallow Maneuvers: Seated upright 90 degrees Oral Care Recommendations: Oral care BID Other Recommendations: Place PMSV during PO intake Follow up Recommendations: Home health SLP  Treatment Plan Treatment Plan Recommendations: Therapy as outlined in treatment plan below Speech Therapy Frequency: min 4x/week Treatment Duration: 3 weeks Interventions: Aspiration precaution training;Compensatory techniques;Diet toleration management by SLP;Patient/family education;Group dysphagia treatment  Prognosis Prognosis for Safe Diet Advancement:  Good Barriers to Reach Goals: Cognitive deficits  Individuals Consulted Consulted and Agree with Results and Recommendations: Patient;Family member/caregiver  Swallowing Goals  SLP Swallowing Goals Patient will consume recommended diet without observed clinical signs of aspiration with: Moderate assistance Patient will utilize recommended strategies during swallow to increase swallowing safety with: Maximum assistance  Swallow Study Prior Functional Status  Cognitive/Linguistic Baseline: Within functional limits Type of Home: House Lives With: Spouse Receives Help From: Family  General  HPI: Transferred to CIR 07/07/11. BSE today to assess swallow safety with current diet.  Type of Study: Bedside swallow evaluation Diet Prior to this Study: Regular;Thin liquids Temperature Spikes Noted: No Respiratory Status: Trach Trach Size and Type: Cuff;#6;With PMSV in place;Deflated History of Intubation: Yes Length of Intubations (days): 6 days Date extubated: 07/02/11 Behavior/Cognition: Alert;Distractible;Requires cueing Oral Cavity - Dentition: Adequate natural dentition Vision: Impaired for self-feeding Patient Positioning: Upright in chair Baseline Vocal Quality: Clear Volitional Cough: Weak Volitional Swallow: Able to elicit  Oral Motor/Sensory Function  Overall Oral Motor/Sensory Function: Impaired Labial ROM: Reduced left Labial Symmetry: Within Functional Limits Labial Strength: Reduced Lingual ROM:  (generalized weakness/poor effort) Lingual Symmetry: Within Functional Limits Lingual Strength: Reduced Facial ROM: Within Functional Limits Facial Symmetry: Within Functional Limits Facial Strength: Within Functional Limits Facial Sensation: Within Functional Limits Velum: Within Functional Limits Mandible: Within Functional Limits  Consistency Results  Ice Chips Ice chips: Not tested  Thin Liquid Presentation: Cup;Straw;Self Fed Pharyngeal  Phase Impairments:  Throat Clearing - Immediate Other Comments: Throat clear X 1 with straw  Nectar Thick Liquid Nectar Thick Liquid: Not tested  Honey Thick Liquid Honey Thick Liquid: Not tested  Puree Puree: Not tested  Solid Other Comments: Pt did not want to participate in trials of regular textures despite multiple efforts. RN and nurse tech report no difficulty with breakfast.    Nathanael Krist 07/08/2011,4:46 PM     PMSV (Passy-Muir Speaking Valve) Evaluation   I. HPI: Pt is a 66 year old female who underwent microdissection of right sphenoid wing meningioma on 1/14. Following procedure, pt developed subdural hemorrhage and underwnt craniotomy and evacuation. Following this procedure pt with difficulty weakning with VDRF. Pt was intubated from 1/15-1/21 and underwent tracheotomy on 1/25. Pt has been tolerating PMSV during all waking hours and with meals. Transferred to CIR 07/07/11.  II. Tracheostomy Tube  Initial trach Placement date: 1/25 Trach collar period: 24/7 (Room air only during session) Type: shiley  Size: 6 FiO2: 24% (When trach collar is on) Cuff: yes  Fenestration: no  Secretion description: minimal to none.   Frequency of tracheal suctioning: 0  Level of secretion expectoration: none V. PMSV Trial  PMSV is being worn during all waking hours Able to redirect subglottic air through upper airway: yes  Able to attain phonation with PMSV: yes  Able to expectorate secretions with PMSV: none  Breath Support for phonation: adequate though phonation attempts limited due to poor attention and initiation  Intelligibiity: 100 at word level  SpO2: 97% HR:86  Behavior: calm  VI. Clinical Impression:  Pt tolerate valve with adequate redirection of air to upper airway for phonation and stable vital signs. No CO2 trapping observed. However, pt with little verbalization due to cognitive impairments at this time. Recommend to wear PMSV during all waking hours and with all POs.  Therapy  Diagnosis:  Cognitive deficits, speech impairment  VII. Recommendations  1. Patient to use PMSV during all waking hours and during all PO. VIII. Treatment Plan  Frequency/Duration: 5x week   Pain: no

## 2011-07-08 NOTE — Evaluation (Signed)
Physical Therapy Assessment and Plan  Patient Details  Name: Jacqueline Christian MRN: [id number] Date of Birth: 1946-05-04  PT Diagnosis: Cognitive deficits, Difficulty walking and Muscle weakness Rehab Potential: Good ELOS: 14-18 days   Today's Date: 07/08/2011 Time: 8:40-9:33 Time Calculation (min): 53 min  Assessment & Plan Clinical Impression: Patient is a 66 y.o. year old female with recent admission to the hospital on 06/22/11 with diagnosis of anterior wing sphenoid tumor and right frontal edema with intraparanchymal hemorrhage.  Patient transferred to CIR on 07/07/2011 .  Patient's past medical history is significant for right knee replacement, left meniscus tear and fibromyalgia.    Patient currently requires total with mobility secondary to muscle weakness, impaired timing and sequencing and decreased motor planning, vision needs to be assessed, decreased initiation, decreased attention, decreased awareness, decreased memory and delayed processing and decreased sitting balance, decreased standing balance, decreased postural control and decreased balance strategies.  Prior to hospitalization, patient was independent and active in the community with mobility and lived with Spouse in a House home.  Home access is 3-7 Stairs to enter.  Patient will benefit from skilled PT intervention to maximize safe functional mobility, minimize fall risk and decrease caregiver burden for planned discharge home with 24 hour assist.  Anticipate patient will benefit from follow up [facility] at discharge.  PT - End of Session Activity Tolerance: Tolerates 30+ min activity with multiple rests PT Assessment Rehab Potential: Good Barriers to Discharge: None (Son & daughter to assist husband with 24 hr @) PT Plan PT Frequency: 2-3 X/day, 60-90 minutes Estimated Length of Stay: 14-18 days PT Treatment/Interventions: Ambulation/gait training;Balance/vestibular training;Cognitive remediation/compensation;Community  reintegration;Functional mobility training;Neuromuscular re-education;Patient/family education;Stair training;Therapeutic Activities;Therapeutic Exercise;UE/LE Strength taining/ROM;UE/LE Coordination activities;Visual/perceptual remediation/compensation PT Recommendation Follow Up Recommendations: Home health PT  Precautions/Restrictions Precautions Precautions: Fall Precaution Comments: trach care Required Braces or Orthoses: No Restrictions Weight Bearing Restrictions: No Therapy Vitals Pulse Rate: 103  Resp: 18  Patient Position, if appropriate: Sitting Oxygen Therapy SpO2: 98 % O2 Device: None (Room air) Pain Pain Assessment Pain Assessment: No/denies pain Home Living/Prior Functioning Home Living Lives With: Spouse Receives Help From: Family Type of Home: House Home Layout: One level Home Access: Stairs to enter [employer] of Steps: 3-7, no rails Prior Function Level of Independence: Other (comment) (unable to obtain from pt.) Vision/Perception  Vision - History Baseline Vision: Other (comment) (unable to test) Cognition Overall Cognitive Status: Impaired Arousal/Alertness: Lethargic Orientation Level: Oriented to person (reported she has a brain tumor) Attention: Focused;Sustained Focused Attention: Impaired Focused Attention Impairment: Verbal basic;Functional basic Sustained Attention: Impaired Sustained Attention Impairment: Verbal basic;Functional basic Memory: Impaired Memory Impairment: Decreased recall of new information;Decreased short term memory Decreased Short Term Memory: Verbal basic;Functional basic Awareness: Impaired Awareness Impairment: Intellectual impairment Problem Solving: Impaired Problem Solving Impairment: Verbal basic;Functional basic Executive Function: Initiating Initiating: Impaired Initiating Impairment: Verbal basic;Functional basic Behaviors: Perseveration Safety/Judgment: Impaired Sensation Sensation Light  Touch: Not tested (pt unable to process/answer) Proprioception: Not tested Coordination Gross Motor Movements are Fluid and Coordinated: Yes Finger Nose Finger Test: unable to complete Motor  Motor Motor: Motor perseverations  Mobility Bed Mobility Bed Mobility: Yes Supine to Sit: 3: Mod assist Supine to Sit Details: Tactile cues for initiation;Manual facilitation for weight shifting Sitting - Scoot to Edge of Bed: 3: Mod assist Transfers Sit to Stand: 3: Mod assist Sit to Stand Details: Tactile cues for initiation;Manual facilitation for weight shifting Stand to Sit: 3: Mod assist Stand to Sit Details (indicate cue type and reason):  Tactile cues for initiation Stand Pivot Transfer Details (indicate cue type and reason): mod@ to initiate and guide pt. Locomotion  Ambulation Ambulation: Yes Ambulation/Gait Assistance: 1: +2 Total assist Ambulation Distance (Feet): 40 Feet Assistive device: 2 person Salm held assist Ambulation/Gait Assistance Details: Tactile cues for initiation Ambulation/Gait Assistance Details (indicate cue type and reason): no gross gait abnormalities noted  Trunk/Postural Assessment  Cervical Assessment Cervical Assessment: Within Functional Limits Thoracic Assessment Thoracic Assessment: Within Functional Limits Lumbar Assessment Lumbar Assessment: Within Functional Limits Postural Control Postural Control: Within Functional Limits  Balance Static Sitting Balance Static Sitting - Balance Support: Bilateral upper extremity supported Static Sitting - Level of Assistance: 4: Min assist Static Standing Balance Static Standing - Balance Support: Bilateral upper extremity supported Static Standing - Level of Assistance: 3: Mod assist Extremity Assessment  RUE Assessment RUE Assessment: Within Functional Limits LUE Assessment LUE Assessment: Within Functional Limits      Recommendations for other services: Neuropsych  Discharge Criteria: Patient  will be discharged from PT if patient refuses treatment 3 consecutive times without medical reason, if treatment goals not met, if there is a change in medical status, if patient makes no progress towards goals or if patient is discharged from hospital.  The above assessment, treatment plan, treatment alternatives and goals were discussed and mutually agreed upon: by family  Georges Mouse 07/08/2011, 3:45 PM

## 2011-07-08 NOTE — Evaluation (Signed)
Occupational Therapy Assessment and Plan  Patient Details  Name: Jacqueline Christian MRN: [id number] Date of Birth: [birth year]  OT Diagnosis: cognitive deficits, disturbance of vision and muscle weakness (generalized) Rehab Potential: Rehab Potential: Good ELOS: 3 weeks   Today's Date: 07/08/2011 Time: 11:15-12:00 Time Calculation (min): 45 min  Assessment & Plan Clinical Impression: Patient is a 66 y.o. year old female history of dizziness and visual deficits with work up revealing a right anterior sphenoid wing meningioma which appears to be encasing the right carotid and circulation. There also appears to be encasement of the right optic nerve. This mass measures 20 x 24 x 31 mm. Patient underwent microdissection with debulking of tumor on 01/15 by Dr. Venetia Maxon. Later that day, patient with decrease in LOC with difficulty following commands. CT brain with right frontal edema and acute intraparenchymal hemorrhage 4.7 X 3.3cm with leftward shift and small amount of SDH. Patient taken to OR for right frontal crani for evacuation of ICH that pm. Post op sedated and vent dependent. Placed on decadron, keppra and IV antibiotics. Patient with difficulty with vent wean,required tracheostomy on 01/25 and currently tolerating ATC. MBS done on 01/29 and patient with some throat clearing but not aspiration or penetration. Started on regular diet. Has had complaints of BLE pain and BLE doppler done 01/29-negative for DVT. Patient with LUE weakness as well as visual deficits. Noted to have poor attention with distractibility.   Patient transferred to CIR on 07/07/2011 .     Patient currently requires total with basic self-care skills secondary to muscle weakness, decreased cardiorespiratoy endurance, impaired timing and sequencing, unbalanced muscle activation, ?? motor apraxia (when more alert), decreased coordination and decreased motor planning, decreased visual acuity, decreased visual perceptual skills and  decreased visual motor skills, decreased midline orientation, decreased attention to left and decreased motor planning, decreased initiation, decreased attention, decreased awareness, decreased problem solving, decreased safety awareness, decreased memory and delayed processing and decreased sitting balance, decreased standing balance, decreased postural control and decreased balance strategies.  Prior to hospitalization, patient could complete ADLs with supervision.  Patient will benefit from skilled intervention to increase independence with basic self-care skills prior to discharge. Anticipate patient will require 24 hour supervision and minimal physical assistance and follow up home health.  OT - End of Session Activity Tolerance: Tolerates < 10 min activity, no significant change in vital signs Endurance Deficit: Yes OT Assessment Rehab Potential: Good OT Plan OT Frequency: 1-2 X/day, 60-90 minutes Estimated Length of Stay: 3 weeks OT Treatment/Interventions: Cognitive remediation/compensation;DME/adaptive equipment instruction;Functional mobility training;Self Care/advanced ADL retraining;Therapeutic Activities;UE/LE Coordination activities;Visual/perceptual remediation/compensation;Therapeutic Exercise;UE/LE Strength taining/ROM;Patient/family education;Neuromuscular re-education;Community reintegration;Balance/vestibular training OT Recommendation Follow Up Recommendations: Home health OT  Precautions/Restrictions  Precautions Precautions: Fall Precaution Comments: trach care Required Braces or Orthoses: No Restrictions Weight Bearing Restrictions: No General Chart Reviewed: Yes Family/Caregiver Present: No Vital Signs Therapy Vitals Pulse Rate: 78  Resp: 16  Patient Position, if appropriate: Lying Oxygen Therapy SpO2: 97 % O2 Device: Trach collar FiO2 (%): 28 % O2 Flow Rate (L/min): 5 L/min Pain Pain Assessment Pain Assessment: No/denies pain Home Living/Prior  Functioning Home Living Lives With: Spouse Receives Help From: Family Type of Home: House Home Layout: One level Home Access: Stairs to enter Secretary/administrator of Steps: pt would not answer Prior Function Level of Independence: Other (comment) (unable to obtain from pt.) ADL ADL Eating: Dependent Grooming: Dependent Upper Body Bathing: Dependent Lower Body Bathing: Dependent Upper Body Dressing: Dependent Lower Body Dressing: Dependent Vision/Perception  Vision -  History Baseline Vision: Other (comment) (unable to test) Visual History:  (difficulty from [facility]) Patient Visual Report: Diplopia;Blurring of vision;Undershooting Vision - Assessment Eye Alignment: Impaired (comment) Vision Assessment: Vision impaired - to be further tested in functional context Perception Perception: Impaired Inattention/Neglect: Does not attend to left visual field;Does not attend to left side of body Spatial Orientation: favors the right   Cognition Overall Cognitive Status: Impaired Arousal/Alertness: Lethargic Orientation Level: Oriented to person (reported she has a brain tumor) Attention: Focused;Sustained Focused Attention: Impaired Focused Attention Impairment: Verbal basic;Functional basic Sustained Attention: Impaired Sustained Attention Impairment: Verbal basic;Functional basic Memory: Impaired Memory Impairment: Decreased recall of new information;Decreased short term memory Decreased Short Term Memory: Verbal basic;Functional basic Awareness: Impaired Awareness Impairment: Intellectual impairment Problem Solving: Impaired Problem Solving Impairment: Verbal basic;Functional basic Executive Function: Initiating Initiating: Impaired Initiating Impairment: Verbal basic;Functional basic Behaviors: Perseveration Safety/Judgment: Impaired Sensation Sensation Light Touch: Not tested (pt unable to process/answer) Proprioception: Not tested Coordination Gross Motor  Movements are Fluid and Coordinated: Yes Finger Nose Finger Test: unable to complete Motor  Motor Motor: Motor perseverations Mobility  Bed Mobility Bed Mobility: Yes Supine to Sit: 3: Mod assist Supine to Sit Details: Tactile cues for initiation;Manual facilitation for weight shifting Sitting - Scoot to Edge of Bed: 3: Mod assist Transfers Sit to Stand: 3: Mod assist Sit to Stand Details: Tactile cues for initiation;Manual facilitation for weight shifting Stand to Sit: 3: Mod assist Stand to Sit Details (indicate cue type and reason): Tactile cues for initiation  Trunk/Postural Assessment  Cervical Assessment Cervical Assessment: Within Functional Limits Thoracic Assessment Thoracic Assessment: Within Functional Limits Lumbar Assessment Lumbar Assessment: Within Functional Limits Postural Control Postural Control: Within Functional Limits  Balance Static Sitting Balance Static Sitting - Balance Support: Bilateral upper extremity supported Static Sitting - Level of Assistance: 4: Min assist Static Standing Balance Static Standing - Balance Support: No upper extremity supported Static Standing - Level of Assistance: 2: Max assist Extremity/Trunk Assessment RUE Assessment RUE Assessment: Within Functional Limits LUE Assessment LUE Assessment: Within Functional Limits LUE Strength Left Shoulder Flexion: 3-/5  Recommendations for other services: None  Discharge Criteria: Patient will be discharged from OT if patient refuses treatment 3 consecutive times without medical reason, if treatment goals not met, if there is a change in medical status, if patient makes no progress towards goals or if patient is discharged from hospital.  The above assessment, treatment plan, treatment alternatives and goals were discussed and mutually agreed upon: by patient  1:1 Treatment : OT eval initiated, OT role, purpose and goals discussed with patient (however dont think pt fully understood).  Focus on performing familiar basic ADL tasks with backward chaining with max to total cuing, pt with delay with all responses (verbal and motorically), sit to stand with mod A with counting verbal cue to initiate, pt with preservative behaviors (picking at hands and at trach collar). Pt presented with increased lethargy then reported in PT and SLP earlier sessions- unable to answer orientation questions.  Melonie Florida 07/08/2011, 4:01 PM

## 2011-07-08 NOTE — Progress Notes (Signed)
Recreational Therapy Assessment and Plan  Patient Details  Name: Jacqueline Christian MRN: [id number] Date of Birth: 26-Jul-1945  Rehab Potential: Good ELOS: 3 weeks   Assessment Clinical Impression:Patient is a 67 y.o. year old female with recent admission to the hospital on 06/22/11 with diagnosis of anterior wing sphenoid tumor and right frontal edema with intraparanchymal hemorrhage. Patient transferred to CIR on 07/07/2011 . Patient's past medical history is significant for right knee replacement, left meniscus tear and fibromyalgia.  Patient currently requires total with mobility secondary to muscle weakness, impaired timing and sequencing and decreased motor planning, vision needs to be assessed, decreased initiation, decreased attention, decreased awareness, decreased memory and delayed processing and decreased sitting balance, decreased standing balance, decreased postural control and decreased balance strategies. Prior to hospitalization, patient was independent and active in the community with mobility and lived with Spouse in a House home. Home access is 3-7 Stairs to enter.    Pt presents with decreased activity tolerance, decreased functional mobility, decreased balance, decreased cognition/safety limiting pt's independence with leisure/communty pursuits.    Recreational Therapy Leisure History/Participation Premorbid leisure interest/current participation: Community - Press photographer - Grocery store;Community - Travel (Comment);Ashby Dawes - Flower gardening Other Leisure Interests: Television;Reading Leisure Participation Style: With Family/Friends Awareness of Community Resources: Good-identify 3 post discharge leisure resources [employer] Appropriate for Education?: Yes Patient agreeable to Pet Therapy: Yes Does patient have pets?: Yes (1 dog, 1 cat) Social interaction - Mood/Behavior: Cooperative Recreational Therapy Orientation Orientation -Reviewed with patient:  Available activity resources Strengths/Weaknesses Patient Strengths/Abilities: Willingness to participate;Active premorbidly Patient weaknesses: Physical limitations  Plan Rec Therapy Plan Is patient appropriate for Therapeutic Recreation?: Yes Rehab Potential: Good Treatment times per week: Min 1 time per week >20 minutes Estimated Length of Stay: 3 weeks Therapy Goals Achieved By:: Recreation/leisure participation;Community reintegration/education;1:1 session;Group participation (Comment);Adaptive equipment instruction;Patient/family education  Recommendations for other services: None  Discharge Criteria: Patient will be discharged from TR if patient refuses treatment 3 consecutive times without medical reason.  If treatment goals not met, if there is a change in medical status, if patient makes no progress towards goals or if patient is discharged from hospital.  The above assessment, treatment plan, treatment alternatives and goals were discussed and mutually agreed upon: by patient  Suni Jarnagin 07/08/2011, 1:45 PM

## 2011-07-08 NOTE — Progress Notes (Signed)
[facility] Individual Statement of Services  Patient Name:  Jacqueline Christian  Date:  07/08/2011  Welcome to the [facility].  Our goal is to provide you with an individualized program based on your diagnosis and situation, designed to meet your specific needs.  With this comprehensive rehabilitation program, you will be expected to participate in at least 3 hours of rehabilitation therapies Monday-Friday, with modified therapy programming on the weekends.  Your rehabilitation program will include the following services:  Physical Therapy (PT), Occupational Therapy (OT), Speech Therapy (ST), 24 hour per day rehabilitation nursing, Therapeutic Recreaction (TR), Case Management (RN and Child psychotherapist), Rehabilitation Medicine, Nutrition Services and Pharmacy Services  Weekly team conferences will be held on  Tuesday  to discuss your progress.  Your RN Case Designer, television/film set will talk with you frequently to get your input and to update you on team discussions.  Team conferences with you and your family in attendance may also be held.  Estimated Length of Stay: about 3 weeks                          Goals: Min Assist-Supervision  Depending on your progress and recovery, your program may change.  Your RN Case Estate agent will coordinate services and will keep you informed of any changes.  Your RN Sports coach and SW names and contact numbers are listed  below.  The following services may also be recommended but are not provided by the [facility]:   Driving Evaluations  Home Health Rehabiltiation Services  Outpatient Rehabilitatation [facility]  Vocational Rehabilitation   Arrangements will be made to provide these services after discharge if needed.  Arrangements include referral to agencies that provide these services.  Your insurance has been verified to be:  Medicare + [facility] Your primary doctor is:  Dr. Wyvonnia Lora  Pertinent information will be shared with your doctor and your insurance company.  Case Manager: Melanee Spry, [facility], [place] [phone]  Social Worker:  [place], [place] [phone]  Information discussed with and copy given to patient by: Brock Ra, 07/08/2011, 12:27 PM

## 2011-07-08 NOTE — Progress Notes (Signed)
Physical Therapy Note  Patient Details  Name: KYNZLI REASE MRN: [id number] Date of Birth: Jul 14, 1945 Today's Date: 07/08/2011  Time:  3:30  Unable to wake pt for PT session.  Pt's son and daughter in room with lots of questions and able to provide d/c info that pt was unable to provide.  Treatment deferred, pt missed 45 minutes of therapy.   Georges Mouse 07/08/2011, 3:58 PM

## 2011-07-08 NOTE — Progress Notes (Signed)
Patient ID: Jacqueline Christian, female   DOB: Jul 27, 1945, 66 y.o.   MRN: [id number] Subjective/Complaints: Review of Systems  Constitutional: Positive for malaise/fatigue.  All other systems reviewed and are negative.  slept well.  Ate almost entire tray today.  Objective: Vital Signs: Blood pressure 105/68, pulse 87, temperature 98.5 F (36.9 C), temperature source Oral, resp. rate 16, height 5\' 3"  (1.6 m), weight 77.565 kg (171 lb), SpO2 97.00%. Dg Swallowing Func-no Report  07/06/2011  CLINICAL DATA: dysphagia   FLUOROSCOPY FOR SWALLOWING FUNCTION STUDY:  Fluoroscopy was provided for swallowing function study, which was  administered by a speech pathologist.  Final results and recommendations  from this study are contained within the speech pathology report.      Basename 07/08/11 0625 07/06/11 0600  WBC 8.3 9.9  HGB 10.8* 10.7*  HCT 32.9* 31.9*  PLT 265 212    Basename 07/08/11 0625 07/05/11 1639  NA 141 142  K 3.8 4.0  CL 103 102  CO2 27 31  GLUCOSE 103* 128*  BUN 13 16  CREATININE 0.73 0.63  CALCIUM 9.9 9.7   CBG (last 3)  No results found for this basename: GLUCAP:3 in the last 72 hours  Wt Readings from Last 3 Encounters:  07/07/11 77.565 kg (171 lb)  07/07/11 77.7 kg (171 lb 4.8 oz)  07/07/11 77.7 kg (171 lb 4.8 oz)    Physical Exam:  General appearance: distracted, no distress and slowed mentation Head: Normocephalic, without obvious abnormality, atraumatic Eyes: conjunctivae/corneas clear. PERRL, EOM's intact. Fundi benign. Ears: normal TM's and external ear canals both ears Nose: Nares normal. Septum midline. Mucosa normal. No drainage or sinus tenderness. Throat: lips, mucosa, and tongue normal; teeth and gums normal Neck: no adenopathy, no carotid bruit, no JVD, supple, symmetrical, trachea midline and thyroid not enlarged, symmetric, no tenderness/mass/nodules Back: symmetric, no curvature. ROM normal. No CVA tenderness. Resp: clear to auscultation  bilaterally Cardio: heart regular without murmur. GI: soft, non-tender; bowel sounds normal; no masses,  no organomegaly Extremities: extremities normal, atraumatic, no cyanosis or edema Pulses: 2+ and symmetric Skin: Skin color, texture, turgor normal. No rashes or lesions Neurologic: left field cut. Inattentive to left.  Does move left arm and leg.  Withdraws to pain on left. Able to identify that she's in  and follows simple commands. Incision/Wound: wound on scalp clean and intact   Assessment/Plan: 1. Functional deficits secondary to meningioma with post op ICH which require 3+ hours per day of interdisciplinary therapy in a comprehensive inpatient rehab setting. Physiatrist is providing close team supervision and 24 hour management of active medical problems listed below. Physiatrist and rehab team continue to assess barriers to discharge/monitor patient progress toward functional and medical goals. FIM:                   Comprehension Comprehension Mode: Auditory Comprehension: 2-Understands basic 25 - 49% of the time/requires cueing 51 - 75% of the time  Expression Expression Mode: Verbal;Nonverbal Expression: 2-Expresses basic 25 - 49% of the time/requires cueing 50 - 75% of the time. Uses single words/gestures.  Social Interaction Social Interaction: 2-Interacts appropriately 25 - 49% of time - Needs frequent redirection.  Problem Solving Problem Solving: 2-Solves basic 25 - 49% of the time - needs direction more than half the time to initiate, plan or complete simple activities  Memory Memory: 2-Recognizes or recalls 25 - 49% of the time/requires cueing 51 - 75% of the time   1. DVT Prophylaxis/Anticoagulation: Mechanical: Sequential  compression devices, below knee Bilateral lower extremities. BLE doppler negative.  2. Pain Management: Unable to determine due to cognitive status. Denies pain currently. ?Intermittent headaches per family.  3.  Mood: Currently with flat affect and poor awareness. Will have LSW follow up with patient and husband for evaluation/support. A lot of her appearance is likely neurologically related.  Monitor closely   -ritalin trial to boost attention and arousal. 4. Sphenoid wing meningioma with vision loss- left crani for debulking/resection of tumor.  5. Resection of tumor with right frontal ICH- on keppra for seizure prophylaxis. -Normalize sleep cycle.  6. Respiratory failure requiring trach: trach sutures to be D/C Friday per PCCM. She is managing secretions well and maintaining sats. Downsize to #4 trach tomorrow. 7. Staph UTI: change antibiotic to macrodantin due to sensitivites. D# 2/5  8. ABLA: Add iron supplement. Recheck hgb stable. 9. HTN: Off tenormin currenty as blood pressures occasionally low. Monitor with bid checks and resume if blood pressures remain elevated. 10. FEN-eating well. D/c panda as no need.   LOS (Days) 1 A FACE TO FACE EVALUATION WAS PERFORMED  SWARTZ,ZACHARY T 07/08/2011, 8:45 AM

## 2011-07-09 ENCOUNTER — Encounter (HOSPITAL_COMMUNITY): Payer: Self-pay | Admitting: *Deleted

## 2011-07-09 DIAGNOSIS — C729 Malignant neoplasm of central nervous system, unspecified: Secondary | ICD-10-CM

## 2011-07-09 DIAGNOSIS — I619 Nontraumatic intracerebral hemorrhage, unspecified: Secondary | ICD-10-CM

## 2011-07-09 DIAGNOSIS — C700 Malignant neoplasm of cerebral meninges: Secondary | ICD-10-CM

## 2011-07-09 DIAGNOSIS — N179 Acute kidney failure, unspecified: Secondary | ICD-10-CM

## 2011-07-09 DIAGNOSIS — Z5189 Encounter for other specified aftercare: Secondary | ICD-10-CM

## 2011-07-09 DIAGNOSIS — J9600 Acute respiratory failure, unspecified whether with hypoxia or hypercapnia: Secondary | ICD-10-CM

## 2011-07-09 DIAGNOSIS — Z930 Tracheostomy status: Secondary | ICD-10-CM

## 2011-07-09 MED ORDER — PANTOPRAZOLE SODIUM 40 MG PO TBEC
40.0000 mg | DELAYED_RELEASE_TABLET | Freq: Every day | ORAL | Status: DC
  Administered 2011-07-09 – 2011-07-28 (×19): 40 mg via ORAL
  Filled 2011-07-09 (×19): qty 1

## 2011-07-09 NOTE — Progress Notes (Signed)
Occupational Therapy Session Note  Patient Details  Name: Jacqueline Christian MRN: [id number] Date of Birth: 1946-02-28  Today's Date: 07/09/2011 Time: 0900-1000 Time Calculation (min): 60 min  Precautions: Precautions Precautions: Fall Precaution Comments: trach care Required Braces or Orthoses: No Restrictions Weight Bearing Restrictions: No  Short Term Goals: OT Short Term Goal 1: Pt will don shirt with min A with cuing  OT Short Term Goal 2: Pt will perform sit to stand in an automatic situation (ie toileting)  with min A OT Short Term Goal 3: Pt will maintain standing with min A with at least one UE support during functional tasks OT Short Term Goal 4: Pt will initate with mod cuing with a grooming tasks at the sink OT Short Term Goal 5: Pt will attend to left side with mod cuing during functional task  Skilled Therapeutic Interventions/Progress Updates:    Pt in bed but alert.  Assisted pt to sit EOB for stand pivot transfer to w/c for dressing activities at sink.  Pt required increased time to initiate tasks with max verbal cues.  Pt required tactile cues and verbal cues to initiate sit to stand to pull up pants.  Pt attempted to thread underpants and pants but required assistance.  Pt pulled up underpants and pants while standing with steady assist.  Pt perseverates with fidgeting with trach collar and requires max verbal and tactile cues for redirection. Pt verbalizes that trach collar is uncomfortable.  O2 sats >98% on RA throughout session.  Focus on task initiation and activity tolerance.  Pain Pain Assessment Pain Assessment: No/denies pain (c/o discomfort under trach collar)   Therapy/Group: Individual Therapy  Rich Brave 07/09/2011, 10:52 AM

## 2011-07-09 NOTE — Progress Notes (Signed)
Physical Therapy Note  Patient Details  Name: Jacqueline Christian MRN: [id number] Date of Birth: 08-Mar-1946 Today's Date: 07/09/2011  13:30-14:15 individual therapy pt unable to rate pain states she itches.  Cognitive rehab in sitting in kitchen environment to sort silverware to work on sustained attention. Pt with 90% accuracy with sorting and required vc and visual cues for task. Pt. Able to sustain attention 2 minutes with mod assist. Gait training with bil. HHA 10' pt with difficulty sidestepping to left. Some facilitation for weight shifts used for turning to aid directional cues.amily educated on cognitive rehab and how to change environment to decrease distractions for better results.   [name] 07/09/2011, 4:48 PM

## 2011-07-09 NOTE — Progress Notes (Signed)
Patient ID: Jacqueline Christian, female   DOB: [birth year], 66 y.o.   MRN: [id number] Patient ID: Jacqueline Christian, female   DOB: [birth year], 66 y.o.   MRN: [id number] Subjective/Complaints: Review of Systems  Constitutional: Positive for malaise/fatigue.  All other systems reviewed and are negative.  Drowsy yesterday especially later in day. No respiratory issues 2/1.  Objective: Vital Signs: Blood pressure 131/67, pulse 81, temperature 98.5 F (36.9 C), temperature source Oral, resp. rate 18, height 5\' 3"  (1.6 m), weight 77.565 kg (171 lb), SpO2 98.00%. No results found.  Basename 07/08/11 0625  WBC 8.3  HGB 10.8*  HCT 32.9*  PLT 265    Basename 07/08/11 0625  NA 141  K 3.8  CL 103  CO2 27  GLUCOSE 103*  BUN 13  CREATININE 0.73  CALCIUM 9.9   CBG (last 3)  No results found for this basename: GLUCAP:3 in the last 72 hours  Wt Readings from Last 3 Encounters:  07/07/11 77.565 kg (171 lb)  07/07/11 77.7 kg (171 lb 4.8 oz)  07/07/11 77.7 kg (171 lb 4.8 oz)    Physical Exam:  General appearance: distracted, no distress and slowed mentation Head: Normocephalic, without obvious abnormality, atraumatic Eyes: conjunctivae/corneas clear. PERRL, EOM's intact. Fundi benign. Ears: normal TM's and external ear canals both ears Nose: Nares normal. Septum midline. Mucosa normal. No drainage or sinus tenderness. Throat: lips, mucosa, and tongue normal; teeth and gums normal Neck: no adenopathy, no carotid bruit, no JVD, supple, symmetrical, trachea midline and thyroid not enlarged, symmetric, no tenderness/mass/nodules Back: symmetric, no curvature. ROM normal. No CVA tenderness. Resp: clear to auscultation bilaterally Cardio: heart regular without murmur. GI: soft, non-tender; bowel sounds normal; no masses,  no organomegaly Extremities: extremities normal, atraumatic, no cyanosis or edema Pulses: 2+ and symmetric Skin: Skin color, texture, turgor normal. No rashes or  lesions Neurologic: left field cut. Inattentive to left.  Does move left arm and leg.  Withdraws to pain on left. Able to identify that she's in [place] and follows simple commands. Difficult to keep awake. Incision/Wound: wound on scalp clean and intact   Assessment/Plan: 1. Functional deficits secondary to meningioma with post op ICH which require 3+ hours per day of interdisciplinary therapy in a comprehensive inpatient rehab setting. Physiatrist is providing close team supervision and 24 hour management of active medical problems listed below. Physiatrist and rehab team continue to assess barriers to discharge/monitor patient progress toward functional and medical goals. FIM: FIM - Bathing Bathing: 0: Activity did not occur  FIM - Upper Body Dressing/Undressing Upper body dressing/undressing: 0: Activity did not occur FIM - Lower Body Dressing/Undressing Lower body dressing/undressing: 0: Activity did not occur  FIM - Toileting Toileting: 0: Activity did not occur  FIM - Archivist Transfers: 0-Activity did not occur  FIM - Bed/Chair Transfer Bed/Chair Transfer: 3: Supine > Sit: Mod A (lifting assist/Pt. 50-74%/lift 2 legs  FIM - Locomotion: Wheelchair Locomotion: Wheelchair: 1: Total Assistance/staff pushes wheelchair (Pt<25%) FIM - Locomotion: Ambulation Ambulation/Gait Assistance: 1: +2 Total assist Locomotion: Ambulation: 0: Activity did not occur  Comprehension Comprehension Mode: Auditory Comprehension: 2-Understands basic 25 - 49% of the time/requires cueing 51 - 75% of the time  Expression Expression Mode: Verbal Expression: 2-Expresses basic 25 - 49% of the time/requires cueing 50 - 75% of the time. Uses single words/gestures.  Social Interaction Social Interaction: 1-Interacts appropriately less than 25% of the time. May be withdrawn or combative.  Problem Solving Problem Solving: 1-Solves basic less  than 25% of the time - needs direction  nearly all the time or does not effectively solve problems and may need a restraint for safety  Memory Memory: 1-Recognizes or recalls less than 25% of the time/requires cueing greater than 75% of the time   1. DVT Prophylaxis/Anticoagulation: Mechanical: Sequential compression devices, below knee Bilateral lower extremities. BLE doppler negative.  2. Pain Management: Unable to determine due to cognitive status. Denies pain currently. ?Intermittent headaches per family.  3. Mood: Currently with flat affect and poor awareness. Will have LSW follow up with patient and husband for evaluation/support. A lot of her appearance is likely neurologically related.  Monitor closely   -ritalin trial to boost attention and arousal.  -re-image brain if any further decline in arousal is seen 4. Sphenoid wing meningioma with vision loss- left crani for debulking/resection of tumor.  5. Resection of tumor with right frontal ICH- on keppra for seizure prophylaxis. -Normalize sleep cycle.  6. Respiratory failure requiring trach: Downsize to #4 trach today 7. Staph UTI: macrodantin due to sensitivites. D# 3/5  8. ABLA: Add iron supplement. Recheck hgb stable. 9. HTN: Off tenormin currenty as blood pressures occasionally low. Monitor with bid checks and resume if blood pressures remain elevated. 10. FEN-eating well. D/c panda as no need.   LOS (Days) 2 A FACE TO FACE EVALUATION WAS PERFORMED  SWARTZ,ZACHARY T 07/09/2011, 7:44 AM

## 2011-07-09 NOTE — Progress Notes (Signed)
Occupational Therapy Session Note  Patient Details  Name: Jacqueline Christian MRN: [id number] Date of Birth: 09-19-1945  Today's Date: 07/09/2011 Time: 4098-1191 Time Calculation (min): 30 min  Precautions: Precautions Precautions: Fall Precaution Comments: trach care Required Braces or Orthoses: No Restrictions Weight Bearing Restrictions: No  Short Term Goals: OT Short Term Goal 1: Pt will don shirt with min A with cuing  OT Short Term Goal 2: Pt will perform sit to stand in an automatic situation (ie toileting)  with min A OT Short Term Goal 3: Pt will maintain standing with min A with at least one UE support during functional tasks OT Short Term Goal 4: Pt will initate with mod cuing with a grooming tasks at the sink OT Short Term Goal 5: Pt will attend to left side with mod cuing during functional task  Skilled Therapeutic Interventions/Progress Updates:    Pt in bed but alert.  Son and dtr present during therapy. Pt agreed to sit on side of bed but exhibited decreased initiation requiring max verbal and tactile cues to initiate.  Pt perseverated on scratching right foot.  Pt applied lotion to right foot after therapist applied lotion in pt's [place].  Pt donned right sock after therapist initiated task.  Pt engaged in ambulation in room with [place] held assist and verbal cues to advance left foot and hold head up.  Pt exhibited improved initiation compared to morning session.  Pt attempted to remove shirt X 3 while sitting in w/c but agreed to keep it on while other people were in room.  Pt returned to bed with son in room.  Pain Assessment Pain Assessment: No/denies pain (c/o itching - RN aware)   Therapy/Group: Individual Therapy  Rich Brave 07/09/2011, 3:20 PM

## 2011-07-09 NOTE — Progress Notes (Signed)
Social Work  Assessment and Plan  Patient Name: Jacqueline Christian  Today's Date: 07/09/2011  Problem List: There is no problem list on file for this patient.   Past Medical History:  Past Medical History  Diagnosis Date  . Recurrent sinus infections     just finished erythromycin dosing 06/18/11  . Hypertension   . Bladder spasms     takes oxybutinin  . Headache   . Dizziness   . Vision changes     r/t brain tumor  . Arthritis   . Depression     situational  . Neoplasm of brain     Past Surgical History:  Past Surgical History  Procedure Date  . Total knee arthroplasty     R  . Abdominal hysterectomy   . Cystectomy     L wrist  . Stapedes surgery     repalced stapes  . Eye surgery 1977    retina repair  . Appendectomy   . Craniotomy 06/22/2011    Procedure: CRANIOTOMY TUMOR EXCISION;  Surgeon: Dorian Heckle, MD;  Location: [facility];  Service: Neurosurgery;  Laterality: Right;  RIGHT pterional Craniotomy for meningioma  . Craniotomy 06/22/2011    Procedure: CRANIOTOMY HEMATOMA EVACUATION SUBDURAL;  Surgeon: Dorian Heckle, MD;  Location: [facility];  Service: Neurosurgery;  Laterality: Right;    Discharge Planning  Discharge Planning Living Arrangements: Spouse/significant other (2nd marriage for both - married ~ 14 yrs.) Support Systems: Spouse/significant other;Children;Family members (spouse, 2 children out of state and 2 sisters) Assistance Needed: none PTA - anticipate will need 24/7 assistance at d/c Do you have any problems obtaining your medications?: No Type of Residence: Private residence Home Care Services: No Patient expects to be discharged to:: home with husband as primary caregiver Expected Discharge Date:  (3-4 weeks) Case Management Consult Needed: Yes (Comment) (following)  Social/Family/Support Systems Social/Family/Support Systems Patient Roles: Scientist, product/process development (Has dtr/son previous marriage, caregiver for mother/brother) Contact  Information: Molly Maduro Higher education careers adviser - spouse (h) [phone] (c) [phone] Anticipated Caregiver: spouse, son, dtr, sisters Anticipated Caregiver's Contact Information: *correction to daughter's # [phone] Ability/Limitations of Caregiver: Husband can assist, dtr/son taking FMLA to assist, sisters can help as well Caregiver Availability: 24/7  Employment Status Employment Status Employment Status: Retired Date Retired/Disabled/Unemployed: Advice worker Issues: none Guardian/Conservator: none  Abuse/Neglect    Emotional Status Emotional Status Pt's affect, behavior adn adjustment status: pt lying in bed and very sleepy - does answer a few personal questions accurately but has difficulty staying awake.  Flat affect and slow responses. Cannot assess emotional status at this point due to decreased alertness, attention and overall cognition but will monitor Recent Psychosocial Issues: none Pyschiatric History: adds...no antidepressant meds either  Patient/Family Perceptions, Expectations & Goals Pt/Family Perceptions, Expectations and Goals Pt/Family understanding of illness & functional limitations: family with general understanding of diagnosis of meningioma, surgery performed and then hemorrhage and second surgery performed; good awareness of current functional limitations Premorbid pt/family roles/activities: family notes that pt was independent overall, but "slowed down by her back and knee problems...didn't move very fast"; pt did share caregiver duties with sisters for their elderly mother and brother Anticipated changes in roles/activities/participation: husband and family to assume caregiver roles for pt - sisters to now assume the duties of caring for their mother and brother. Pt/family expectations/goals: "just don't want ya'll to put her out of here too early" - Husband not specific on what he hopes for pt functionally.    [facility]  Community  Resources [employer]: None Premorbid Home Care/DME Agencies: None Transportation available at discharge: yes Resource referrals recommended: Neuropsychology;Support group (specify);Advocacy groups (Brain Injury vs Stroke groups)  Discharge Assessment Discharge Planning Insurance Resources: Medicare;Private Insurance (specify) [facility]) Financial Resources: Social Security Financial Screen Referred: No Living Expenses: Own Money Management: Spouse Home Management: pt and spouse Patient/Family Preliminary Plans: home with husband to be primary caregiver supplemented by children (taking FMLA) and local sisters Barriers to Discharge: Family Support (strained dynamics between husband and pt's children)  Clinical Impression:  Unfortunate woman here after surgery for meningioma and subsequent hemorrhage following.  Husband and family at bedside.  Two adult children here from [place] (these are pt's children from first marriage).  All appear very supportive and asking questions.  Pt very lethargic and really unable to engage in interview due to decreased alertness and overall cognition.  Will monitor emotional adjustment as she proceeds through tx program.  Megan Salon, Giorgio Chabot 07/09/2011

## 2011-07-09 NOTE — Progress Notes (Signed)
Speech Language Pathology Daily Session Note  Patient Details  Name: Jacqueline Christian MRN: [id number] Date of Birth: [birth year]  Today's Date: 07/09/2011 Time: 1130-1225 Time Calculation (min): 55 min  Short Term Goals: Week 1: SLP Short Term Goal 1 (Week 1): Pt will demonstrate sustained attention for 1 minute during self-feeding task.  SLP Short Term Goal 2 (Week 1): Pt will answer yes/no questions with 75% of opportunities  SLP Short Term Goal 3 (Week 1): Pt will follow 1 step commands during a functional task with Max A verbal and gestural cues SLP Short Term Goal 4 (Week 1): Pt will initiate with Max verbal cues during a functional task.   Skilled Therapeutic Interventions: Treatment focus on sustained attention and initiation of functional tasks. Max A with visual and verbal cues to initiate opening utensils for meal. Pt answered 50% of yes/no questions throughout session. Initiated conversation X 1 during calendar/attention task (My moms birthday is next week). Increased verbal expression and social interaction today, especially when talking about her dog, "molly." pt demonstrated increased facial expressions and humor. Max focused attention to task was ~30secs.    FIM:  Comprehension Comprehension: 2-Understands basic 25 - 49% of the time/requires cueing 51 - 75% of the time Expression Expression: 2-Expresses basic 25 - 49% of the time/requires cueing 50 - 75% of the time. Uses single words/gestures. Social Interaction Social Interaction: 2-Interacts appropriately 25 - 49% of time - Needs frequent redirection. Problem Solving Problem Solving: 1-Solves basic less than 25% of the time - needs direction nearly all the time or does not effectively solve problems and may need a restraint for safety Memory Memory: 2-Recognizes or recalls 25 - 49% of the time/requires cueing 51 - 75% of the time Pain Pain Assessment Pain Assessment: No/denies pain (c/o itching - RN aware) Pain Score:    7 Pain Type: Acute pain Pain Location: Leg Pain Orientation: Right;Left Pain Descriptors: Aching Pain Onset: On-going Pain Intervention(s): Medication (See eMAR);RN made aware  Therapy/Group: Individual Therapy  Brie Eppard 07/09/2011, 3:50 PM

## 2011-07-09 NOTE — Progress Notes (Signed)
Patient: Jacqueline Christian MRN: [id number] Date of admission: 07/07/2011 Date of service: 07/09/2011                                                                        PCCM PROGRESS NOTE  HPI:  66 y/o woman with a Hx of HTN admitted with dizziness and visual disturbance. 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 Subsequently Dr. Venetia Maxon (Neurosurgery) performed microdissection/debulking of hemangioma on 06/21/11 at 16:00 pm. At 21:00 the patient became unresponsive and a repeat imaging of her head demonstrated a postoperative subdural hematoma that required an emergent hematoma decompression. Patient tolerated second procedure well. PCCM was consulted for a continued ventilator management.  Antibiotics:   Cefazolin (post surgical prophylaxis only) 1/15 >> stopped Ceftriaxone 1/21 >>>off Bactrim 1/27>>>  Cultures/Sepsis Markers:   Resp 1/21 >> NOF Urine 1/27>>> staph species  Access/Protocols:  Left radial Aline 1/15>> 1/20 ETT (7.5 mm) 1/15 >> 1/21 (failed due to stridor and secretions) ETT 1/21 >>1/25 Perc trach [name] 1/25>>>  Best Practice: DVT: Coagulopathic, SCDs GI: Protonix  Subjective/ Overnight:  Trach collar continues, lethargic intermittent, pmv limited  Physical Exam: Filed Vitals:   07/09/11 1239  BP:   Pulse: 80  Temp:   Resp: 14    Intake/Output Summary (Last 24 hours) at 07/09/11 1554 Last data filed at 07/09/11 1500  Gross per 24 hour  Intake    360 ml  Output    502 ml  Net   -142 ml   Vent Mode:  [-]  FiO2 (%):  [28 %] 28 %  Physical Exam: General: more awake, lethargic now Neuro: no longer drowsy,. Alert, appropriate CV: s1s2 rrr, distant, NSR PULM: CTA GI: abd soft, nt, +bs, tol TF Extremities:  Warm and dry, scant BLE edema    Labs:  Lab 07/08/11 0625 07/05/11 1639 07/05/11 0545  NA 141  142 141  K 3.8 4.0 3.7  CL 103 102 100  CO2 27 31 31   BUN 13 16 15   CREATININE 0.73 0.63 0.64  GLUCOSE 103* 128* 123*    Lab 07/08/11 0625 07/06/11 0600 07/04/11 0500  HGB 10.8* 10.7* 10.2*  HCT 32.9* 31.9* 31.1*  WBC 8.3 9.9 13.1*  PLT 265 212 191    Lab 07/03/11 0423  MG 2.2   Lab Results  Component Value Date   CALCIUM 9.9 07/08/2011   PHOS 3.4 07/03/2011    CXR: Dg Chest Port 1 View  pcxr - improved atx daily   Assessment & Plan: 66 y/o female s/p resection of a meningioma who developed AMS due to an acute subdural and intracranial hemorrhage. Now s/p craniotomy and hematoma evacuation.   Acute respiratory failure -- s/p resection of meningioma with acute intracranial hemorrhage s/p craniotomy and hematoma evacuation. Failed extubation 1/21 due to stridor and secretions. Tol ATC wean s/p trach 1/25.  PLAN -  Day 7 trach, dc sutures I would not downsize as of yet  To 4 Consider change trach to cuffless 6 She needs to improve her Neurostatus prior to consideration decannulation and needs to improve her PMV  status  And strength  Meningioma, post op acute intracranial hemorrhage s/p craniotomy and hematoma evacuation.  PLAN -  - Mgmt per Dr Venetia Maxon and Hermelinda Medicus  Will follow Monday Updated fmaily  Jacqueline Christian. Tyson Alias, MD, FACP Pgr: [phone] [facility]  Jacqueline Christian.

## 2011-07-09 NOTE — Progress Notes (Signed)
Patient noted scratching legs and arms and hands this afternoon . Patient distracted with itching and could not tolerate therapy . eucerin lotion applied to extremities and benadryl 12.5mg  elixir given at 1452 with relief of itching. Trach care provided at 1800 . No secretions noted . o2 sat at 94% on room air. Continue with plan of care .      [name]

## 2011-07-10 NOTE — Progress Notes (Signed)
Speech Language Pathology Daily Session Note  Patient Details  Name: Jacqueline Christian MRN: [id number] Date of Birth: August 29, 1945  Today's Date: 07/10/2011 Time: 1100-1200 Time Calculation (min): 60 min  Short Term Goals: Week 1: SLP Short Term Goal 1 (Week 1): Pt will demonstrate sustained attention for 1 minute during self-feeding task.  SLP Short Term Goal 2 (Week 1): Pt will answer yes/no questions with 75% of opportunities  SLP Short Term Goal 3 (Week 1): Pt will follow 1 step commands during a functional task with Max A verbal and gestural cues SLP Short Term Goal 4 (Week 1): Pt will initiate with Max verbal cues during a functional task.   Skilled Therapeutic Interventions: Treatment focused on sustained attention in various functional, basic, familiar tasks.  Overall, required total assist via verbal, tactile, auditory and contextual cues to sustain attention greater than 8-10 seconds.  Perseverative behaviors of face rubbing, arm scratching and finger manipulation of IV line was a significant distractor, at times with no response to any of SLP's cues.  SLP involved a "new rule" of keeping hands placed on table in front of her with a mildly increased verbal response time to basic yes/no questions, however patient only able to maintain static hands for 10-15 seconds.  Lunch tray assistance primarily for initiation of unwrapping silverware, and total assist level verbal, tactile, auditory cues to initiate picking up fork to self feed.  Patient did verbally initiate with SLP on 2 occasions, asking about how long employed at [employer] and then she wanted [place] lotion.  Several instances of verbalizations that did not match context, however did match previously asked questions from several minutes prior.  FIM:  Comprehension Comprehension Mode: Auditory Comprehension: 3-Understands basic 50 - 74% of the time/requires cueing 25 - 50%  of the time Expression Expression Mode: Verbal Expression: 2-Expresses  basic 25 - 49% of the time/requires cueing 50 - 75% of the time. Uses single words/gestures. Social Interaction Social Interaction: 1-Interacts appropriately less than 25% of the time. May be withdrawn or combative. Problem Solving Problem Solving: 2-Solves basic 25 - 49% of the time - needs direction more than half the time to initiate, plan or complete simple activities Memory Memory: 1-Recognizes or recalls less than 25% of the time/requires cueing greater than 75% of the time FIM - Eating Eating Activity: 5: Supervision/cues General    Pain Pain Assessment Pain Assessment: No/denies pain  Therapy/Group: Individual Therapy  Myra Rude, M.S.,CCC-SLP Pager [phone]  07/10/2011, 12:31 PM

## 2011-07-10 NOTE — Progress Notes (Signed)
Physical Therapy Note  Patient Details  Name: NIKETA TURNER MRN: [id number] Date of Birth: [birth year] Today's Date: 07/10/2011  1000-1055 (55 minutes) individual treatment session Pain - no reported pain Focus of treatment: Gait training with appropriate assistive device; therapeutic activities to improve tolerance to activity Treatment: Oxygen SATS with PMV 99% RA pulse  81; supine to sit SBA with vcs; Transfer - SPT min/mod assist; Gait X 2 with RW min/mod assist to steer AD 30 feet (oxygen sats > 92% post ambulation RA; Nustep (crosstrainer) goal of 5 minutes or 150 steps- pt required max cues for goal but could not recall    Keiland Pickering,JIM 07/10/2011, 10:38 AM

## 2011-07-10 NOTE — Procedures (Signed)
Tracheostomy Change Note  Patient Details:   Name: Jacqueline Christian DOB: 1945-08-08 MRN: [id number]    Airway Documentation:    Pt. Trach changed to 6cfs per MD order pt sats 99% on room air HR 86. Scant blood around stoma Evaluation  O2 sats: stable throughout Complications: No apparent complications Patient did tolerate procedure well. Bilateral Breath Sounds: Diminished    Georgeanna Lea 07/10/2011, 3:47 PM

## 2011-07-10 NOTE — Progress Notes (Signed)
Pt confused prn, bed alarm set, side rails up x3,

## 2011-07-10 NOTE — Progress Notes (Signed)
Patient ID: Jacqueline Christian, female   DOB: 1945-09-03, 66 y.o.   MRN: [id number] Patient ID: Jacqueline Christian, female   DOB: 1945-09-15, 66 y.o.   MRN: [id number] Patient ID: Jacqueline Christian, female   DOB: [birth year], 66 y.o.   MRN: [id number] Subjective/Complaints: Review of Systems  Constitutional: Positive for malaise/fatigue.  All other systems reviewed and are negative.  2/2.  Comfortable;   receiving humidified oxygen to trach.  Exam revealed the patient to be  alert and responds to questioning with nodding of the head; chest is clear anterolaterally. Cardiovascular exam revealed normal heart sounds without tachycardia. Abdomen soft flat nontender. Extremities no edema Objective: Vital Signs: Blood pressure 135/77, pulse 75, temperature 98.3 F (36.8 C), temperature source Axillary, resp. rate 17, height 5\' 3"  (1.6 m), weight 171 lb (77.565 kg), SpO2 95.00%. No results found.  Basename 07/08/11 0625  WBC 8.3  HGB 10.8*  HCT 32.9*  PLT 265    Basename 07/08/11 0625  NA 141  K 3.8  CL 103  CO2 27  GLUCOSE 103*  BUN 13  CREATININE 0.73  CALCIUM 9.9   CBG (last 3)  No results found for this basename: GLUCAP:3 in the last 72 hours  Wt Readings from Last 3 Encounters:  07/07/11 171 lb (77.565 kg)  07/07/11 171 lb 4.8 oz (77.7 kg)  07/07/11 171 lb 4.8 oz (77.7 kg)   BP Readings from Last 3 Encounters:  07/10/11 135/77  07/07/11 120/48  07/07/11 120/48   Physical Exam:  General appearance: distracted, no distress and slowed mentation Head: Normocephalic, without obvious abnormality, atraumatic Eyes: conjunctivae/corneas clear. PERRL, EOM's intact. Fundi benign. Ears: normal TM's and external ear canals both ears Nose: Nares normal. Septum midline. Mucosa normal. No drainage or sinus tenderness. Throat: lips, mucosa, and tongue normal; teeth and gums normal Neck: no adenopathy, no carotid bruit, no JVD, supple, symmetrical, trachea midline and thyroid not enlarged, symmetric,  no tenderness/mass/nodules Back: symmetric, no curvature. ROM normal. No CVA tenderness. Resp: clear to auscultation bilaterally Cardio: heart regular without murmur. GI: soft, non-tender; bowel sounds normal; no masses,  no organomegaly Extremities: extremities normal, atraumatic, no cyanosis or edema Pulses: 2+ and symmetric Skin: Skin color, texture, turgor normal. No rashes or lesions Neurologic: left field cut. Inattentive to left.  Does move left arm and leg.  Withdraws to pain on left. Able to identify that she's in [place] and follows simple commands. Difficult to keep awake. Incision/Wound: wound on scalp clean and intact   Assessment/Plan: 1. Functional deficits secondary to meningioma with post op ICH which require 3+ hours per day of interdisciplinary therapy in a comprehensive inpatient rehab setting. Physiatrist is providing close team supervision and 24 hour management of active medical problems listed below. Physiatrist and rehab team continue to assess barriers to discharge/monitor patient progress toward functional and medical goals. FIM: FIM - Bathing Bathing: 0: Activity did not occur  FIM - Upper Body Dressing/Undressing Upper body dressing/undressing steps patient completed: Put head through opening of pull over shirt/dress;Pull shirt over trunk Upper body dressing/undressing: 0: Activity did not occur FIM - Lower Body Dressing/Undressing Lower body dressing/undressing steps patient completed: Pull pants up/down;Pull underwear up/down Lower body dressing/undressing: 0: Activity did not occur  FIM - Hotel manager Devices: Grab bar or rail for support Toileting: 1: Two helpers  FIM - Diplomatic Services operational officer Devices: Psychiatrist Transfers: 1-Two helpers  FIM - Games developer Transfer: 3: Supine > Sit: Mod  A (lifting assist/Pt. 50-74%/lift 2 legs;1: Two helpers  FIM - Locomotion:  Wheelchair Locomotion: Wheelchair: 0: Activity did not occur FIM - Locomotion: Ambulation Ambulation/Gait Assistance: 1: +2 Total assist Locomotion: Ambulation: 0: Activity did not occur  Comprehension Comprehension Mode: Auditory Comprehension: 3-Understands basic 50 - 74% of the time/requires cueing 25 - 50%  of the time  Expression Expression Mode: Verbal Expression: 3-Expresses basic 50 - 74% of the time/requires cueing 25 - 50% of the time. Needs to repeat parts of sentences.  Social Interaction Social Interaction: 2-Interacts appropriately 25 - 49% of time - Needs frequent redirection.  Problem Solving Problem Solving: 2-Solves basic 25 - 49% of the time - needs direction more than half the time to initiate, plan or complete simple activities  Memory Memory: 2-Recognizes or recalls 25 - 49% of the time/requires cueing 51 - 75% of the time   1. DVT Prophylaxis/Anticoagulation: Mechanical: Sequential compression devices, below knee Bilateral lower extremities. BLE doppler negative.  2. Pain Management: Unable to determine due to cognitive status. Denies pain currently. ?Intermittent headaches per family.  3. Mood: Currently with flat affect and poor awareness. Will have LSW follow up with patient and husband for evaluation/support. A lot of her appearance is likely neurologically related.  Monitor closely   -ritalin trial to boost attention and arousal.  -re-image brain if any further decline in arousal is seen 4. Sphenoid wing meningioma with vision loss- left crani for debulking/resection of tumor.  5. Resection of tumor with right frontal ICH- on keppra for seizure prophylaxis. -Normalize sleep cycle.  6. Respiratory failure requiring trach: Downsize to #4 trach today 7. Staph UTI: macrodantin due to sensitivites. D# 3/5  8. ABLA: Add iron supplement. Recheck hgb stable. 9. HTN: Off tenormin currenty as blood pressures occasionally low. Monitor with bid checks and resume if  blood pressures remain elevated. 10. FEN-eating well. D/c panda as no need.   LOS (Days) 3 A FACE TO FACE EVALUATION WAS PERFORMED  Rogelia Boga 07/10/2011, 9:33 AM

## 2011-07-10 NOTE — Progress Notes (Signed)
Occupational Therapy Session Note  Patient Details  Name: Jacqueline Christian MRN: [id number] Date of Birth: 1945/11/12  Today's Date: 07/10/2011 Time: 1725-1810 Time Calculation (min): 45 min and 45 minutes= 90 minutes  Precautions: Precautions Precautions: Fall Precaution Comments: trach care Required Braces or Orthoses: No Restrictions Weight Bearing Restrictions: No  Short Term Goals: OT Short Term Goal 1: Pt will don shirt with min A with cuing  OT Short Term Goal 2: Pt will perform sit to stand in an automatic situation (ie toileting)  with min A OT Short Term Goal 3: Pt will maintain standing with min A with at least one UE support during functional tasks OT Short Term Goal 4: Pt will initate with mod cuing with a grooming tasks at the sink OT Short Term Goal 5: Pt will attend to left side with mod cuing during functional task  Skilled Therapeutic Interventions/Progress Updates: Patient seen for therapeutic self care and cognitive tasks today.  Patient able to complete toileting to minimal assistance.  Overall, today patient demonstrated the need for extra time to process instructions or receptive language and exhibited expressive responses     Vital Signs Therapy Vitals Temp: 98.3 F (36.8 C) Temp src: Oral Pulse Rate: 86  Resp: 20  BP: 129/59 mmHg Patient Position, if appropriate: Lying Oxygen Therapy SpO2: 99 % O2 Device: Trach collar FiO2 (%): 28 % O2 Flow Rate (L/min): 5 L/min  Pain no c/os   ADL Eating: Dependent Grooming: Dependent Upper Body Bathing: Dependent Lower Body Bathing: Dependent Upper Body Dressing: Dependent Lower Body Dressing: Dependent  Therapy/Group: Individual Therapy  Bud Face [facility] 07/10/2011, 8:08 PM

## 2011-07-10 NOTE — Progress Notes (Signed)
Pt complaint of pain to legs and rubbing head at incision site, medicated with Vicodin see mar,,

## 2011-07-11 NOTE — Progress Notes (Signed)
Patient ID: Jacqueline Christian, female   DOB: April 10, 1946, 66 y.o.   MRN: [id number] Patient ID: Jacqueline Christian, female   DOB: 01/29/1946, 66 y.o.   MRN: [id number] Patient ID: Jacqueline Christian, female   DOB: 03/25/1946, 66 y.o.   MRN: [id number] Patient ID: Jacqueline Christian, female   DOB: Jan 10, 1946, 66 y.o.   MRN: [id number] Subjective/Complaints: Review of Systems  Constitutional: Positive for malaise/fatigue.  All other systems reviewed and are negative.  2/3.   Comfortable;   receiving humidified oxygen to trach.  Exam revealed the patient to be  alert and responds to questioning with nodding of the head; chest is clear anterolaterally. Cardiovascular exam revealed normal heart sounds without tachycardia. Abdomen soft flat nontender. Extremities no edema,  SCD in place  Objective: Vital Signs: Blood pressure 145/78, pulse 76, temperature 98.3 F (36.8 C), temperature source Axillary, resp. rate 16, height 5\' 3"  (1.6 m), weight 171 lb (77.565 kg), SpO2 98.00%. No results found. No results found for this basename: WBC:2,HGB:2,HCT:2,PLT:2 in the last 72 hours No results found for this basename: NA:2,K:2,CL:2,CO2:2,GLUCOSE:2,BUN:2,CREATININE:2,CALCIUM:2 in the last 72 hours CBG (last 3)  No results found for this basename: GLUCAP:3 in the last 72 hours  Wt Readings from Last 3 Encounters:  07/07/11 171 lb (77.565 kg)  07/07/11 171 lb 4.8 oz (77.7 kg)  07/07/11 171 lb 4.8 oz (77.7 kg)   BP Readings from Last 3 Encounters:  07/11/11 145/78  07/07/11 120/48  07/07/11 120/48   Physical Exam:  General appearance: distracted, no distress and slowed mentation Head: Normocephalic, without obvious abnormality, atraumatic Eyes: conjunctivae/corneas clear. PERRL, EOM's intact. Fundi benign. Ears: normal TM's and external ear canals both ears Nose: Nares normal. Septum midline. Mucosa normal. No drainage or sinus tenderness. Throat: lips, mucosa, and tongue normal; teeth and gums normal Neck: no  adenopathy, no carotid bruit, no JVD, supple, symmetrical, trachea midline and thyroid not enlarged, symmetric, no tenderness/mass/nodules Back: symmetric, no curvature. ROM normal. No CVA tenderness. Resp: clear to auscultation bilaterally Cardio: heart regular without murmur. GI: soft, non-tender; bowel sounds normal; no masses,  no organomegaly Extremities: extremities normal, atraumatic, no cyanosis or edema Pulses: 2+ and symmetric Skin: Skin color, texture, turgor normal. No rashes or lesions Neurologic: left field cut. Inattentive to left.  Does move left arm and leg.  Withdraws to pain on left. Able to identify that she's in  and follows simple commands. Difficult to keep awake. Incision/Wound: wound on scalp clean and intact   Assessment/Plan: 1. Functional deficits secondary to meningioma with post op ICH which require 3+ hours per day of interdisciplinary therapy in a comprehensive inpatient rehab setting. Physiatrist is providing close team supervision and 24 hour management of active medical problems listed below. Physiatrist and rehab team continue to assess barriers to discharge/monitor patient progress toward functional and medical goals. FIM: FIM - Bathing Bathing: 0: Activity did not occur  FIM - Upper Body Dressing/Undressing Upper body dressing/undressing steps patient completed: Put head through opening of pull over shirt/dress;Pull shirt over trunk Upper body dressing/undressing: 0: Activity did not occur FIM - Lower Body Dressing/Undressing Lower body dressing/undressing steps patient completed: Pull pants up/down;Pull underwear up/down Lower body dressing/undressing: 0: Activity did not occur  FIM - Toileting Toileting steps completed by patient: Adjust clothing prior to toileting;Adjust clothing after toileting;Performs perineal hygiene Toileting Assistive Devices: Grab bar or rail for support Toileting: 4: Steadying assist (required extra time to  complete the steps of toileting)  FIM - Toilet  Transfers Toilet Transfers Assistive Devices: Elevated toilet seat Toilet Transfers: 1-Two helpers  FIM - Games developer Transfer: 3: Bed > Chair or W/C: Mod A (lift or lower assist);5: Supine > Sit: Supervision (verbal cues/safety issues)  FIM - Locomotion: Wheelchair Locomotion: Wheelchair: 0: Activity did not occur FIM - Locomotion: Ambulation Locomotion: Ambulation Assistive Devices: Designer, industrial/product Ambulation/Gait Assistance: 3: Mod assist Locomotion: Ambulation: 1: Travels less than 50 ft with moderate assistance (Pt: 50 - 74%)  Comprehension Comprehension Mode: Auditory Comprehension: 3-Understands basic 50 - 74% of the time/requires cueing 25 - 50%  of the time  Expression Expression Mode: Verbal Expression: 2-Expresses basic 25 - 49% of the time/requires cueing 50 - 75% of the time. Uses single words/gestures.  Social Interaction Social Interaction: 1-Interacts appropriately less than 25% of the time. May be withdrawn or combative.  Problem Solving Problem Solving: 2-Solves basic 25 - 49% of the time - needs direction more than half the time to initiate, plan or complete simple activities  Memory Memory: 1-Recognizes or recalls less than 25% of the time/requires cueing greater than 75% of the time   1. DVT Prophylaxis/Anticoagulation: Mechanical: Sequential compression devices, below knee Bilateral lower extremities. BLE doppler negative.  2. Pain Management: Unable to determine due to cognitive status. Denies pain currently. ?Intermittent headaches per family.  3. Mood: Currently with flat affect and poor awareness. Will have LSW follow up with patient and husband for evaluation/support. A lot of her appearance is likely neurologically related.  Monitor closely   -ritalin trial to boost attention and arousal.  -re-image brain if any further decline in arousal is seen 4. Sphenoid wing meningioma with vision  loss- left crani for debulking/resection of tumor.  5. Resection of tumor with right frontal ICH- on keppra for seizure prophylaxis. -Normalize sleep cycle.  6. Respiratory failure requiring trach: Downsize to #4 trach today 7. Staph UTI: macrodantin due to sensitivites. D# 3/5  8. ABLA: Add iron supplement. Recheck hgb stable. 9. HTN: Off tenormin currenty as blood pressures occasionally low. Monitor with bid checks and resume if blood pressures remain elevated. 10. FEN-eating well. D/c panda as no need.   LOS (Days) 4 A FACE TO FACE EVALUATION WAS PERFORMED  Rogelia Boga 07/11/2011, 9:33 AM

## 2011-07-11 NOTE — Progress Notes (Deleted)
Occupational Therapy Session Note  Patient Details  Name: Jacqueline Christian MRN: [id number] Date of Birth: [birth year]  Today's Date: 07/11/2011 Time:  -    and 45 minutes= 90 minutes  Precautions: Precautions Precautions: Fall Precaution Comments: trach care Required Braces or Orthoses: No Restrictions Weight Bearing Restrictions: No  Short Term Goals: OT Short Term Goal 1: Pt will don shirt with min A with cuing  OT Short Term Goal 2: Pt will perform sit to stand in an automatic situation (ie toileting)  with min A OT Short Term Goal 3: Pt will maintain standing with min A with at least one UE support during functional tasks OT Short Term Goal 4: Pt will initate with mod cuing with a grooming tasks at the sink OT Short Term Goal 5: Pt will attend to left side with mod cuing during functional task  Skilled Therapeutic Interventions/Progress Updates: Patient seen for therapeutic self care and cognitive tasks today.  Patient able to complete toileting to minimal assistance.  Overall, today patient demonstrated the need for extra time to process instructions or receptive language and exhibited expressive responses    Pain no c/os   Therapy/Group: Individual Therapy  Rozelle Logan 07/11/2011, 5:19 PM

## 2011-07-11 NOTE — Progress Notes (Signed)
1055 RT note  Rt called to patients room because trach was dislodged. Trach reinserted with help from another RT. No apparent complication. Inner cannula and ties changed. Pt stable throughtout with no desat issues.  BBS cl. Positive end tidal CO2 .

## 2011-07-11 NOTE — Progress Notes (Signed)
Pt with trach 1/2 way dislodged, sats at 97% , respiratory notifed and to room, trach to become 100% dislodged while resp at bedside, RT to replace trach with no noted distress, will continue to monitor

## 2011-07-11 NOTE — Progress Notes (Signed)
Called to patient room due to dislodgement of #6 cuffless trach.  Patient is in no obvious distress, rr 16-20, sats 93-97% on room air.  RT unable to reinsert trach due to resistance in the airway.  Dr Amador Cunas paged and he recommended calling CCM for guidance.  The CCM in-house doc called and is on the way up from ED.  RN at the bedside, pt continues to be in no distress.  35% atc applied, hr 95.

## 2011-07-11 NOTE — Progress Notes (Signed)
Occupational Therapy Session Note  Patient Details  Name: Jacqueline Christian MRN: [id number] Date of Birth: 11/19/1945  Today's Date: 07/11/2011 Time:  -   1530-1430  ( )  Pain:  None Individual Session  Precautions: Precautions Precautions: Fall Precaution Comments: trach care Required Braces or Orthoses: No Restrictions Weight Bearing Restrictions: No  Short Term Goals: OT Short Term Goal 1: Pt will don shirt with min A with cuing  OT Short Term Goal 2: Pt will perform sit to stand in an automatic situation (ie toileting)  with min A OT Short Term Goal 3: Pt will maintain standing with min A with at least one UE support during functional tasks OT Short Term Goal 4: Pt will initate with mod cuing with a grooming tasks at the sink OT Short Term Goal 5: Pt will attend to left side with mod cuing during functional task  Skilled Therapeutic Interventions/Progress Updates:    Pt. Sitting in chair with husband and sister.  Sister reported that pt has been itching due to some allergic reaction.  No one has the reason yet as for the itching.  Addressed functional balance, standing tolerance, attention to the left, LUE gross and fine motor coordination, attention to tasks.  Pt. Engaged in therapeutic activities at Hi/Lo table with max gestural cues  And manual facilitation.  She stood  For 10-30 seconds x 6 to do activities.  Family present.  Discussed her dog Molly with better engagement and sustained attention.  Delayed processing in responses to questions.  Used counting to initiate movement.  Attended to left to put up towels and fold.    Therapy/Group: Individual Therapy  Humberto Seals 07/11/2011, 3:44 PM

## 2011-07-11 NOTE — Progress Notes (Signed)
Occupational Therapy Session Note  Patient Details  Name: Jacqueline Christian MRN: [id number] Date of Birth: 12-Aug-1945  Today's Date: 07/10/2011 Time:  -    and 45 minutes= 90 minutes  Precautions: Precautions Precautions: Fall Precaution Comments: trach care Required Braces or Orthoses: No Restrictions Weight Bearing Restrictions: No  Short Term Goals: OT Short Term Goal 1: Pt will don shirt with min A with cuing  OT Short Term Goal 2: Pt will perform sit to stand in an automatic situation (ie toileting)  with min A OT Short Term Goal 3: Pt will maintain standing with min A with at least one UE support during functional tasks OT Short Term Goal 4: Pt will initate with mod cuing with a grooming tasks at the sink OT Short Term Goal 5: Pt will attend to left side with mod cuing during functional task  Skilled Therapeutic Interventions/Progress Updates: Patient seen for therapeutic self care and cognitive tasks today.  Patient able to complete toileting to minimal assistance.  Overall, today patient demonstrated the need for extra time to process instructions or receptive language and exhibited expressive responses    Pain no c/os   Therapy/Group: Individual Therapy  Rozelle Logan 07/11/2011, 5:20 PM

## 2011-07-11 NOTE — Progress Notes (Signed)
Trach care given at this time, pt tolerated well

## 2011-07-12 DIAGNOSIS — C729 Malignant neoplasm of central nervous system, unspecified: Secondary | ICD-10-CM

## 2011-07-12 DIAGNOSIS — N179 Acute kidney failure, unspecified: Secondary | ICD-10-CM

## 2011-07-12 DIAGNOSIS — Z930 Tracheostomy status: Secondary | ICD-10-CM

## 2011-07-12 MED ORDER — METHYLPHENIDATE HCL 5 MG PO TABS
10.0000 mg | ORAL_TABLET | Freq: Two times a day (BID) | ORAL | Status: DC
  Administered 2011-07-12 – 2011-07-28 (×33): 10 mg via ORAL
  Filled 2011-07-12: qty 2
  Filled 2011-07-12: qty 1
  Filled 2011-07-12 (×2): qty 2
  Filled 2011-07-12: qty 1
  Filled 2011-07-12 (×10): qty 2
  Filled 2011-07-12: qty 1
  Filled 2011-07-12 (×4): qty 2
  Filled 2011-07-12: qty 1
  Filled 2011-07-12 (×3): qty 2
  Filled 2011-07-12: qty 1
  Filled 2011-07-12 (×10): qty 2

## 2011-07-12 NOTE — Progress Notes (Signed)
Physical Therapy Note  Patient Details  Name: Jacqueline Christian MRN: [id number] Date of Birth: September 17, 1945 Today's Date: 07/12/2011  8:30-9:30 individual therapy pt denied pain. Pt tired this morning due to bad night last night per nursing. Performed PROM to bil. Legs. pt did not appear to be tight in any muscle group. Pt stated she needed to use the bathroom when asked. Attempted to go on [facility] without result after gait into bathroom pt urinated immediately . Pt able to perform toileting task and perseverated with wiping x 3. vc needed to redirect. Gait into bathroom min assist with left HHA with vc for left inattention to space. Pt able to demonstrate sustained attention to wash hands with auditory cues for items located on left side. Bed mobility mod assist with visual cues to lay to left. Pt attempting to lay towards foot of bed on rt.  O2 reamined over 90% with PMV on RA. Pt oriented to place and required 15 seconds and 2 repititions of asking to respond.   [name] 07/12/2011, 10:06 AM

## 2011-07-12 NOTE — Progress Notes (Signed)
Occupational Therapy Session Note  Patient Details  Name: Jacqueline Christian MRN: [id number] Date of Birth: [birth year]  Today's Date: 07/12/2011 Time: 1000-1100 Time Calculation (min): 60 min  Precautions: Precautions Precautions: Fall Precaution Comments: trach care Required Braces or Orthoses: No Restrictions Weight Bearing Restrictions: No  Short Term Goals: OT Short Term Goal 1: Pt will don shirt with min A with cuing  OT Short Term Goal 1 - Progress: Met OT Short Term Goal 2: Pt will perform sit to stand in an automatic situation (ie toileting)  with min A OT Short Term Goal 3: Pt will maintain standing with min A with at least one UE support during functional tasks OT Short Term Goal 3 - Progress: Met OT Short Term Goal 4: Pt will initate with mod cuing with a grooming tasks at the sink OT Short Term Goal 4 - Progress: Met OT Short Term Goal 5: Pt will attend to left side with mod cuing during functional task  Skilled Therapeutic Interventions/Progress Updates:    Pt in bed with husband feeding her ice cream.  Pt alert and greeted therapist when spoken to.  Husband present to observe therapy session.  ADL retraining w/c level at sink with sit to stand to bathe buttocks/front perineal area and pull up pants.  Pt required min verbal cues for initiation of bathing tasks with increased time to respond.  Pt required mod max verbal cues for Ishler placement with sit to stand.  Pt stood at sink without UE support to bathe front perineal area and pull up pants.  Pt threads right leg of underpants and pants but requires assistance to thread left.  When pt initially attempted to pull up underpants and pants she didn't pull up underpants.  Pt required max verbal cues to correct problem.  Pt stated that she fatigued quickly and required multiple rest breaks throughout session.  Focus on task initiation, attention, activity tolerance, and standing balance.  Pain Pain Assessment Pain Assessment:  0-10 Pain Score:  (pt c/o discomfort in throat from trach) Pain Intervention(s): RN made aware ADL  Therapy/Group: Individual Therapy  Rich Brave, COTA/L 07/12/2011, 11:23 AM

## 2011-07-12 NOTE — Progress Notes (Signed)
Speech Language Pathology Daily Session Note  Patient Details  Name: Jacqueline Christian MRN: [id number] Date of Birth: 03-11-1946  Today's Date: 07/12/2011 Time: 1130-1230 Time Calculation (min): 60 min  Skilled Therapeutic Interventions: Treatment focus on sustained attention and initiation during with functional and familiar tasks. Pt demonstrates increased verbalizations with increased responses to both yes/no questions and open-ended questions with 75% of opportunities. Max A verbal and visual cues needed to initiate meal, however, sustained attention to meal for ~15 mins with supervision verbal cues. Clinician modified environment by eliminating distractions and maintaining a quiet environment. Total A with [place] over [place] assist for a slow pace and small bites throughout meal. Husband present and educated on current cognitive function and strategies to utilize.   Daily Session FIM:  Comprehension Comprehension: 3-Understands basic 50 - 74% of the time/requires cueing 25 - 50%  of the time Expression Expression: 2-Expresses basic 25 - 49% of the time/requires cueing 50 - 75% of the time. Uses single words/gestures. Social Interaction Social Interaction: 2-Interacts appropriately 25 - 49% of time - Needs frequent redirection. Problem Solving Problem Solving: 3-Solves basic 50 - 74% of the time/requires cueing 25 - 49% of the time Memory Memory: 1-Recognizes or recalls less than 25% of the time/requires cueing greater than 75% of the time FIM - Eating Eating Activity: 5: Set-up assist for open containers Pain No/Denies Pain Oral/Motor: Oral Motor/Sensory Function Overall Oral Motor/Sensory Function: Impaired Labial ROM: Reduced left Labial Symmetry: Within Functional Limits Labial Strength: Reduced Lingual ROM:  (generalized weakness/poor effort) Lingual Symmetry: Within Functional Limits Lingual Strength: Reduced Facial ROM: Within Functional Limits Facial Symmetry: Within Functional  Limits Facial Strength: Within Functional Limits Facial Sensation: Within Functional Limits Velum: Within Functional Limits Mandible: Within Functional Limits Comprehension: Auditory Comprehension Overall Auditory Comprehension: Impaired Other Conversation Comments: Pt reposnded to ~50% of questions. Pt answered yes/no biorgraphical questions correctly and verbalized the names of her children and husband. Deficits impacted by attention and iniation.  Interfering Components: Attention;Processing speed EffectiveTechniques: Extra processing time;Repetition;Visual/Gestural cues Reading Comprehension Reading Status: Not tested Expression: Expression Primary Mode of Expression: Verbal Verbal Expression Overall Verbal Expression: Impaired Initiation: Impaired Automatic Speech: Social Response Level of Generative/Spontaneous Verbalization: Word;Phrase Naming: No impairment Pragmatics: Impairment Impairments: Abnormal affect;Turn Taking;Eye contact Interfering Components: Attention Effective Techniques: Open ended questions (about biographical information) Other Verbal Expression Comments: Pt will initiate basic needs. "I need to go to the bathroom."  Written Expression Written Expression: Not tested  Therapy/Group: Individual Therapy  Tere Mcconaughey 07/12/2011, 3:24 PM

## 2011-07-12 NOTE — Progress Notes (Signed)
Occupational Therapy Session Note  Patient Details  Name: MAKINA SKOW MRN: [id number] Date of Birth: 04/04/1946  Today's Date: 07/12/2011 Time: 1410-1450 Time Calculation (min): 40 min  Precautions: Precautions Precautions: Fall Precaution Comments: trach care Required Braces or Orthoses: No Restrictions Weight Bearing Restrictions: No   Skilled Therapeutic Interventions/Progress Updates:    Husband present for therapy session.  Focus on attention to cognitive task (sorting cards by color).  Pt required max verbal cues to initiate task to pick up each card but was able to sort them correctly 90% of time.  Pt exhibited increased error with sorting as patient fatigued near the end of the session.  Pt stated (husband verified) that she sees snakes and spiders in room.  Pt asked appropriately if her brain would get better.  Educated pt and husband regarding cognitive rehab and therapy goals/therapy process.    Pain  Pt denies pain   Therapy/Group: Individual Therapy  Rich Brave 07/12/2011, 3:19 PM

## 2011-07-12 NOTE — Progress Notes (Signed)
CCM dr was actually in the [facility] ED, and would be a while in getting here, so RT placed a #4 cuffless trach without difficulty.  Positive color change on easy cap.  Patient tolerated well, sats above 95% throughout.  Dr Detterding notified, CCM dr to check in on patient.

## 2011-07-12 NOTE — Progress Notes (Signed)
Subjective/Complaints: Review of Systems  Constitutional: Positive for malaise/fatigue.  All other systems reviewed and are negative.  2/4---pulled trached out a couple times yesterday  Objective: Vital Signs: Blood pressure 102/79, pulse 79, temperature 97.9 F (36.6 C), temperature source Axillary, resp. rate 16, height 5\' 3"  (1.6 m), weight 77.565 kg (171 lb), SpO2 94.00%. No results found. No results found for this basename: WBC:2,HGB:2,HCT:2,PLT:2 in the last 72 hours No results found for this basename: NA:2,K:2,CL:2,CO2:2,GLUCOSE:2,BUN:2,CREATININE:2,CALCIUM:2 in the last 72 hours CBG (last 3)  No results found for this basename: GLUCAP:3 in the last 72 hours  Wt Readings from Last 3 Encounters:  07/07/11 77.565 kg (171 lb)  07/07/11 77.7 kg (171 lb 4.8 oz)  07/07/11 77.7 kg (171 lb 4.8 oz)   BP Readings from Last 3 Encounters:  07/11/11 102/79  07/07/11 120/48  07/07/11 120/48   Physical Exam:  General appearance: distracted, no distress and slowed mentation Head: Normocephalic, without obvious abnormality, atraumatic Eyes: conjunctivae/corneas clear. PERRL, EOM's intact. Fundi benign. Ears: normal TM's and external ear canals both ears Nose: Nares normal. Septum midline. Mucosa normal. No drainage or sinus tenderness. Throat: lips, mucosa, and tongue normal; teeth and gums normal. #4 trach hardly in airway Neck: no adenopathy, no carotid bruit, no JVD, supple, symmetrical, trachea midline and thyroid not enlarged, symmetric, no tenderness/mass/nodules Back: symmetric, no curvature. ROM normal. No CVA tenderness. Resp: clear to auscultation bilaterally Cardio: heart regular without murmur. GI: soft, non-tender; bowel sounds normal; no masses,  no organomegaly Extremities: extremities normal, atraumatic, no cyanosis or edema Pulses: 2+ and symmetric Skin: Skin color, texture, turgor normal. No rashes or lesions Neurologic: left field cut. Inattentive to left.  Does  move left arm and leg.  Withdraws to pain on left. Able to identify that she's in [place] and follows simple commands. Difficult to keep awake. Incision/Wound: wound on scalp clean and intact Exam 2/4  Assessment/Plan: 1. Functional deficits secondary to meningioma with post op ICH which require 3+ hours per day of interdisciplinary therapy in a comprehensive inpatient rehab setting. Physiatrist is providing close team supervision and 24 hour management of active medical problems listed below. Physiatrist and rehab team continue to assess barriers to discharge/monitor patient progress toward functional and medical goals. FIM: FIM - Bathing Bathing: 1: Total-Patient completes 0-2 of 10 parts or less than 25%  FIM - Upper Body Dressing/Undressing Upper body dressing/undressing steps patient completed: Put head through opening of pull over shirt/dress;Pull shirt over trunk;Thread/unthread left sleeve of front closure shirt/dress;Thread/unthread right sleeve of front closure shirt/dress Upper body dressing/undressing: 2: Max-Patient completed 25-49% of tasks FIM - Lower Body Dressing/Undressing Lower body dressing/undressing steps patient completed: Pull underwear up/down;Pull pants up/down Lower body dressing/undressing: 2: Max-Patient completed 25-49% of tasks  FIM - Toileting Toileting steps completed by patient: Adjust clothing after toileting;Performs perineal hygiene;Adjust clothing prior to toileting Toileting Assistive Devices: Grab bar or rail for support;Toilet Aid/prosthesis/orthosis Toileting: 3: Mod-Patient completed 2 of 3 steps  FIM - Diplomatic Services operational officer Devices: Psychiatrist Transfers: 3-From toilet/BSC: Mod A (lift or lower assist)  FIM - Games developer Transfer: 3: Chair or W/C > Bed: Mod A (lift or lower assist)  FIM - Locomotion: Wheelchair Locomotion: Wheelchair: 1: Total Assistance/staff pushes wheelchair  (Pt<25%) FIM - Locomotion: Ambulation Locomotion: Ambulation Assistive Devices: Designer, industrial/product Ambulation/Gait Assistance: 3: Mod assist Locomotion: Ambulation: 1: Travels less than 50 ft with maximal assistance (Pt: 25 - 49%)  Comprehension Comprehension Mode: Auditory Comprehension: 3-Understands  basic 50 - 74% of the time/requires cueing 25 - 50%  of the time  Expression Expression Mode: Verbal Expression: 2-Expresses basic 25 - 49% of the time/requires cueing 50 - 75% of the time. Uses single words/gestures.  Social Interaction Social Interaction: 1-Interacts appropriately less than 25% of the time. May be withdrawn or combative.  Problem Solving Problem Solving: 2-Solves basic 25 - 49% of the time - needs direction more than half the time to initiate, plan or complete simple activities  Memory Memory: 2-Recognizes or recalls 25 - 49% of the time/requires cueing 51 - 75% of the time   1. DVT Prophylaxis/Anticoagulation: Mechanical: Sequential compression devices, below knee Bilateral lower extremities. BLE doppler negative.  2. Pain Management: Unable to determine due to cognitive status. Denies pain currently. ?Intermittent headaches per family.  3. Mood: Currently with flat affect and poor awareness. Will have LSW follow up with patient and husband for evaluation/support. A lot of her appearance is likely neurologically related.  Monitor closely   -ritalin trial to boost attention and arousal. Increase to 10mg   -re-image brain if any further decline in arousal is seen 4. Sphenoid wing meningioma with vision loss- left crani for debulking/resection of tumor.  5. Resection of tumor with right frontal ICH- on keppra for seizure prophylaxis. -Normalize sleep cycle.  6. Respiratory failure requiring trach: has #4 trach. Would like to remove today.  The current trach is hardly in the airway at this point. 7. Staph UTI: abx completed  8. ABLA: Add iron supplement. Recheck hgb  stable. 9. HTN: Off tenormin currenty as blood pressures occasionally low. Monitor with bid checks and resume if blood pressures remain elevated. 10. FEN-eating well. D/c panda as no need.   LOS (Days) 5 A FACE TO FACE EVALUATION WAS PERFORMED  SWARTZ,ZACHARY T 07/12/2011, 8:29 AM

## 2011-07-12 NOTE — Progress Notes (Signed)
Patient: Jacqueline Christian MRN: [id number] Date of admission: 07/07/2011 Date of service: 07/12/2011                                                                        PCCM PROGRESS NOTE  HPI:  66 y/o woman with a Hx of HTN admitted with dizziness and visual disturbance. 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 Subsequently Dr. Venetia Maxon (Neurosurgery) performed microdissection/debulking of hemangioma on 06/21/11 at 16:00 pm. At 21:00 the patient became unresponsive and a repeat imaging of her head demonstrated a postoperative subdural hematoma that required an emergent hematoma decompression. Patient tolerated second procedure well. PCCM was consulted for a continued ventilator management.  Antibiotics:   Cefazolin (post surgical prophylaxis only) 1/15 >> stopped Ceftriaxone 1/21 >>>off Bactrim 1/27>>>  Cultures/Sepsis Markers:   Resp 1/21 >> NOF Urine 1/27>>> staph species  Access/Protocols:  Left radial Aline 1/15>> 1/20 ETT (7.5 mm) 1/15 >> 1/21 (failed due to stridor and secretions) ETT 1/21 >>1/25 Perc trach [name] 1/25>>>  Best Practice: DVT: Coagulopathic, SCDs GI: Protonix  Subjective/ Overnight:  Trach collar continues, lethargic intermittent, pmv limited  Physical Exam: Filed Vitals:   07/12/11 0948  BP:   Pulse: 89  Temp:   Resp: 18    Intake/Output Summary (Last 24 hours) at 07/12/11 1323 Last data filed at 07/12/11 0939  Gross per 24 hour  Intake    240 ml  Output      3 ml  Net    237 ml   Vent Mode:  [-]  FiO2 (%):  [28 %] 28 %  Physical Exam: General: more awake, lethargic now Neuro: no longer drowsy,. Alert, appropriate CV: s1s2 rrr, distant, NSR PULM: CTA GI: abd soft, nt, +bs, tol TF Extremities:  Warm and dry, scant BLE edema    Labs:  Lab 07/08/11 0625 07/05/11 1639  NA 141 142  K 3.8 4.0   CL 103 102  CO2 27 31  BUN 13 16  CREATININE 0.73 0.63  GLUCOSE 103* 128*    Lab 07/08/11 0625 07/06/11 0600  HGB 10.8* 10.7*  HCT 32.9* 31.9*  WBC 8.3 9.9  PLT 265 212   No results found for this basename: MG in the last 168 hours Lab Results  Component Value Date   CALCIUM 9.9 07/08/2011   PHOS 3.4 07/03/2011    CXR: Dg Chest Port 1 View  pcxr - improved atx daily   Assessment & Plan: 66 y/o female s/p resection of a meningioma who developed AMS due to an acute subdural and intracranial hemorrhage. Now s/p craniotomy and hematoma evacuation.   Acute respiratory failure -- s/p resection of meningioma with acute intracranial hemorrhage s/p craniotomy and hematoma evacuation. Failed extubation 1/21 due to stridor and secretions. Tol ATC wean s/p trach 1/25.  PLAN -  Day 10 tracheostomy Self removed and 4 placed cuff less overweekend Trach currently with plug She has excellent phonation, more alert and appropriate No secretions to speak of Her neurostatus, has progressed well On RA sat 97%  Agree cap , in am if doing well,  then decannulation, call if needed further    Updated husband  Jacqueline Christian. Tyson Alias, MD, FACP Pgr: [phone] [facility]  Nelda Bucks.

## 2011-07-13 NOTE — Progress Notes (Signed)
Patient alert/oriented to self and place with no hallucinations.  Patient had some confusion at hs attempting to stretch to pull out trach. Simple commands are followed with a decrease in delay. Patient was able to make needs known to staff. Trach site capped during shift. O2 saturations ranging from 91-97%. Patient voided on bedside commode with 2+ mod assist.

## 2011-07-13 NOTE — Progress Notes (Signed)
Patient ID: Jacqueline Christian, female   DOB: Feb 02, 1946, 66 y.o.   MRN: [id number] Subjective/Complaints: Review of Systems  Constitutional: Positive for malaise/fatigue.  All other systems reviewed and are negative.  2/5-  Good night.  Minimal headache "i'm thirsty"  Objective: Vital Signs: Blood pressure 130/81, pulse 87, temperature 98.1 F (36.7 C), temperature source Oral, resp. rate 16, height 5\' 3"  (1.6 m), weight 77.565 kg (171 lb), SpO2 93.00%. No results found. No results found for this basename: WBC:2,HGB:2,HCT:2,PLT:2 in the last 72 hours No results found for this basename: NA:2,K:2,CL:2,CO2:2,GLUCOSE:2,BUN:2,CREATININE:2,CALCIUM:2 in the last 72 hours CBG (last 3)  No results found for this basename: GLUCAP:3 in the last 72 hours  Wt Readings from Last 3 Encounters:  07/07/11 77.565 kg (171 lb)  07/07/11 77.7 kg (171 lb 4.8 oz)  07/07/11 77.7 kg (171 lb 4.8 oz)   BP Readings from Last 3 Encounters:  07/13/11 130/81  07/07/11 120/48  07/07/11 120/48   Physical Exam:  General appearance: distracted, no distress and slowed mentation Head: Normocephalic, without obvious abnormality, atraumatic Eyes: conjunctivae/corneas clear. PERRL, EOM's intact. Fundi benign. Ears: normal TM's and external ear canals both ears Nose: Nares normal. Septum midline. Mucosa normal. No drainage or sinus tenderness. Throat: lips, mucosa, and tongue normal; teeth and gums normal. #4 trach plugged.  Good phonation. Neck: no adenopathy, no carotid bruit, no JVD, supple, symmetrical, trachea midline and thyroid not enlarged, symmetric, no tenderness/mass/nodules Back: symmetric, no curvature. ROM normal. No CVA tenderness. Resp: clear to auscultation bilaterally Cardio: heart regular without murmur. GI: soft, non-tender; bowel sounds normal; no masses,  no organomegaly Extremities: extremities normal, atraumatic, no cyanosis or edema Pulses: 2+ and symmetric Skin: Skin color, texture, turgor  normal. No rashes or lesions Neurologic: left field cut. Much more alert today. Answers biographical questions with a little delay.  Knows she's in the hospital. Incision/Wound: wound on scalp clean and intact Exam 2/5  Assessment/Plan: 1. Functional deficits secondary to meningioma with post op ICH which require 3+ hours per day of interdisciplinary therapy in a comprehensive inpatient rehab setting. Physiatrist is providing close team supervision and 24 hour management of active medical problems listed below. Physiatrist and rehab team continue to assess barriers to discharge/monitor patient progress toward functional and medical goals.  DC wrist restraints today.  FIM: FIM - Bathing Bathing Steps Patient Completed: Chest;Right Arm;Left Arm;Abdomen;Front perineal area Bathing: 0: Activity did not occur  FIM - Upper Body Dressing/Undressing Upper body dressing/undressing steps patient completed: Thread/unthread right sleeve of pullover shirt/dresss;Pull shirt over trunk;Put head through opening of pull over shirt/dress;Thread/unthread left sleeve of pullover shirt/dress Upper body dressing/undressing: 0: Activity did not occur FIM - Lower Body Dressing/Undressing Lower body dressing/undressing steps patient completed: Thread/unthread right underwear leg;Pull underwear up/down;Thread/unthread right pants leg;Don/Doff left sock;Pull pants up/down Lower body dressing/undressing: 0: Activity did not occur  FIM - Toileting Toileting steps completed by patient: Performs perineal hygiene Toileting Assistive Devices: Grab bar or rail for support Toileting: 0: Activity did not occur  FIM - Diplomatic Services operational officer Devices: Psychiatrist Transfers: 0-Activity did not occur  FIM - Games developer Transfer: 3: Sit > Supine: Mod A (lifting assist/Pt. 50-74%/lift 2 legs);3: Bed > Chair or W/C: Mod A (lift or lower assist)  FIM - Locomotion:  Wheelchair Locomotion: Wheelchair: 0: Activity did not occur FIM - Locomotion: Ambulation Locomotion: Ambulation Assistive Devices: Designer, industrial/product Ambulation/Gait Assistance: 4: Min assist Locomotion: Ambulation: 0: Activity did not occur  Comprehension Comprehension  Mode: Auditory Comprehension: 4-Understands basic 75 - 89% of the time/requires cueing 10 - 24% of the time  Expression Expression Mode: Verbal Expression: 5-Expresses basic needs/ideas: With no assist  Social Interaction Social Interaction: 2-Interacts appropriately 25 - 49% of time - Needs frequent redirection.  Problem Solving Problem Solving: 1-Solves basic less than 25% of the time - needs direction nearly all the time or does not effectively solve problems and may need a restraint for safety  Memory Memory: 2-Recognizes or recalls 25 - 49% of the time/requires cueing 51 - 75% of the time   1. DVT Prophylaxis/Anticoagulation: Mechanical: Sequential compression devices, below knee Bilateral lower extremities. BLE doppler negative.  2. Pain Management: Unable to determine due to cognitive status. Denies pain currently. ?Intermittent headaches per family.  3. Mood: Currently with flat affect and poor awareness. Will have LSW follow up with patient and husband for evaluation/support. A lot of her appearance is likely neurologically related.  Monitor closely   -ritalin trial to boost attention and arousal. Increased to 10mg  yesterday and it's helping. 4. Sphenoid wing meningioma with vision loss- left crani for debulking/resection of tumor.  5. Resection of tumor with right frontal ICH- on keppra for seizure prophylaxis. -Normalize sleep cycle.  6. Respiratory failure requiring trach: has #4 trach. No problems with capping last night. DC trach today. 7. Staph UTI: abx completed  8. ABLA: Add iron supplement. Recheck hgb stable. 9. HTN: Off tenormin currenty as blood pressures occasionally low. Monitor with bid checks  and resume if blood pressures remain elevated. 10. FEN-eating well.    LOS (Days) 6 A FACE TO FACE EVALUATION WAS PERFORMED  Baby Stairs T 07/13/2011, 7:43 AM

## 2011-07-13 NOTE — Progress Notes (Signed)
Occupational Therapy Session Note  Patient Details  Name: Jacqueline Christian MRN: [id number] Date of Birth: 21-May-1946  Today's Date: 07/13/2011  1st Session Time: 0800-0900 Time Calculation (min): 60 min Pt denies pain  Precautions: Precautions Precautions: Fall Precaution Comments: trach care Required Braces or Orthoses: No Restrictions Weight Bearing Restrictions: No  Short Term Goals: OT Short Term Goal 1: Pt will don shirt with min A with cuing  OT Short Term Goal 1 - Progress: Met OT Short Term Goal 2: Pt will perform sit to stand in an automatic situation (ie toileting)  with min A OT Short Term Goal 3: Pt will maintain standing with min A with at least one UE support during functional tasks OT Short Term Goal 3 - Progress: Met OT Short Term Goal 4: Pt will initate with mod cuing with a grooming tasks at the sink OT Short Term Goal 4 - Progress: Met OT Short Term Goal 5: Pt will attend to left side with mod cuing during functional task  Skilled Therapeutic Interventions/Progress Updates:    Pt in alert and in bed at beginning of session.  Pt quickly sat edge of bed and transferred to w/c when requested.  Pt engaged in ADL retraining including bathing and dressing w/c level at sink.  Pt continues to required mod max verbal cues for sequencing and initiation of tasks but initiating tasks with decreased time lapse after request by therapist.  Pt requires max verbal cues for LE and UE positioning prior to standing.  Pt sits back in chair after ~10 seconds standing stating that she is tired.  Pt required less physical assistance with dressing tasks this morning.  Pt engaged in conversation throughout session.  Therapy/Group: Individual Therapy   2nd Session Time: 1345-1420 Individual Therapy Pt denies pain  Functional amb with r/w for home management tasks and standing activities to water plants in day room.  Pt requires mod verbal cues for initiation to stand up with mod verbal  cues for UE and LE positioning.  Pt required max encouragement to participate in watering plants.  Pt continually requested to sit down during activity.  Pt engaged in determining appropriately if plants were watered correctly.  Pt stated that she continues to see "things" in her room but she is not sure what they are.  Therapist unable to determine what she was looking at in room.  Husband present during session.  Lavone Neri [facility] 07/13/2011, 1:38 PM

## 2011-07-13 NOTE — Progress Notes (Signed)
Speech Language Pathology Daily Session Note  Patient Details  Name: Jacqueline Christian MRN: [id number] Date of Birth: 1945-09-02  Today's Date: 07/13/2011 Time: 1130-1230 Time Calculation (min): 60 min  Skilled Therapeutic Interventions: Treatment focus on initiation and sustained attention. Clinician facilitated by eliminating external distractions and utilizing a quiet environment. Max A verbal and visual cues needed for pt to initiate self-feeding with 90% of opportunities. Total A verbal and visual cues needed to apply pressure to stoma during functional conversation.     Daily Session FIM:  Comprehension Comprehension: 4-Understands basic 75 - 89% of the time/requires cueing 10 - 24% of the time Expression Expression Mode: Verbal Expression: 5-Expresses basic 90% of the time/requires cueing < 10% of the time. Social Interaction Social Interaction: 2-Interacts appropriately 25 - 49% of time - Needs frequent redirection. Problem Solving Problem Solving: 2-Solves basic 25 - 49% of the time - needs direction more than half the time to initiate, plan or complete simple activities Memory Memory: 2-Recognizes or recalls 25 - 49% of the time/requires cueing 51 - 75% of the time Pain Pain Assessment Pain Assessment: No/denies pain  Therapy/Group: Individual Therapy  Jacqueline Christian 07/13/2011, 3:25 PM

## 2011-07-13 NOTE — Progress Notes (Signed)
Patient ID: Jacqueline Christian, female   DOB: 27-Sep-1945, 66 y.o.   MRN: [id number]  Social visit.  Up in w/c, currently between therapy sessions. Alert, turning head toward nurse upon entry. Answers questions more readily. Flat affect persists. Denies discomfort.  Thank you to CIR team for following and Tx.  Oris Drone Ashok Sawaya, RN, BSN

## 2011-07-13 NOTE — Progress Notes (Signed)
Pt decanulated, 4X4 guaze placed over site. Pt able to talk, states it feels so much better without it. Pt on RA with oxygen saturations at 98%. No complications noted. RT will monitor stoma for 24 hours

## 2011-07-13 NOTE — Progress Notes (Signed)
Physical Therapy Note  Patient Details  Name: Jacqueline Christian MRN: [id number] Date of Birth: 05-10-1946 Today's Date: 07/13/2011  9:00- 10:00 individual therapy pt denied pain.  Gait training with rw x 20', 30', 10' min assist. Up and down 4 steps x 2 with min assist with 2 rails for strengthening. O2 and HR stayed WNL above 98% on RA. Pt required rest and vc for initiation. Performed cognitive rehab to spell name with letters focusing on initiation and sustatined attention.   [name] 07/13/2011, 12:50 PM

## 2011-07-14 DIAGNOSIS — I619 Nontraumatic intracerebral hemorrhage, unspecified: Secondary | ICD-10-CM

## 2011-07-14 DIAGNOSIS — C700 Malignant neoplasm of cerebral meninges: Secondary | ICD-10-CM

## 2011-07-14 DIAGNOSIS — Z5189 Encounter for other specified aftercare: Secondary | ICD-10-CM

## 2011-07-14 NOTE — Progress Notes (Signed)
Per State Regulation 482.30 This chart was reviewed for medical necessity with respect to the patient's Admission/Duration of stay. Pt participating txs.  [name] has been d/c'd & pt is tolerating well.  Ritalin increased per MD.  See Pt Care Conf note for further info.   Brock Ra                 Nurse Care Manager              Next Review Date: 07/19/11

## 2011-07-14 NOTE — Progress Notes (Signed)
dressing to trach site changed, site with no redness, swelling or drainage noted, to right and left of trach site scabbed where sutures where dc'd, cleaned with saline and 2/2 placed, medi pore tape placed, pt tolerated well

## 2011-07-14 NOTE — Progress Notes (Signed)
Pt co itching, medicated with po benadryl at 1645

## 2011-07-14 NOTE — Patient Care Conference (Signed)
Inpatient RehabilitationTeam Conference Note Date: 07/13/2011   Time: 3:10 PM    Patient Name: Jacqueline Christian      Medical Record Number: [id number]  Date of Birth: [birth year] Sex: Female         Room/Bed: 4028/4028-02 Payor Info: Payor: MEDICARE  Plan: MEDICARE PART A AND B  Product Type: *No Product type*     Admitting Diagnosis: Meningioma Resection, Crani for SDH  Admit Date/Time:  07/07/2011  2:34 PM Admission Comments: No comment available   Primary Diagnosis:  ICH (intracerebral hemorrhage) Principal Problem: ICH (intracerebral hemorrhage)  Patient Active Problem List  Diagnoses Date Noted  . ICH (intracerebral hemorrhage) 07/09/2011    Expected Discharge Date: Expected Discharge Date: 07/28/11  Team Members Present: Physician: Dr. Faith Rogue Case Manager Present: Melanee Spry, RN Social Worker Present: Amada Jupiter, LCSW Nurse Present: Carlean Purl, RN PT Present: Edson Snowball, PT OT Present: Roney Mans, OT;Ardis Rowan, Corky Crafts, OT SLP Present: Feliberto Gottron, SLP Other (Discipline and Name): Tora Duck, PPS Coordinator RN Present: Carlean Purl    Current Status/Progress Goal Weekly Team Focus  Medical   trach out.  arousal improving. neurologically stable  increased attention and awareness.  improved communication skills  airway maintenance, encourage adequate sleep and nutrition   Bowel/Bladder   Occasional incontience  urine at HS  Continent bowel and bladder  Timed toileting   Swallow/Nutrition/ Hydration             ADL's   bathing - mod A, UB dsg - min A, LB dsg - max A, decreased attention requiring mod/max verbal cues for initiation  supervision overall  attention, task initiation, activity tolerance   Mobility     min A transfers  Min A ambulating less than 30'   S transfers  Min A ambulating 150'     Communication   Mod-Max A  Min A  expression of wants/needs   Safety/Cognition/ Behavioral Observations  Max A  Min A  attention,  initiation   Pain   C/O sore throat, legs "aching"  managed, 2/10  Offer PRN's; observe for non-verbal s/s pain.   Skin   Mild redness at groin area.  resolving at d/c.   No breakdown, no s/s infection.  Nystatin powder to groin. Frequent checks for incontinence      *See Interdisciplinary Assessment and Plan and progress notes for long and short-term goals  Barriers to Discharge: cognitive defiicts    Possible Resolutions to Barriers:  supervision at home    Discharge Planning/Teaching Needs:  Home with husband as primary caregiver - supplemental assistance from pt's sisters and adult children (who both live in [place] area)      Team Discussion: Txs report pt w/ visual hallucinations--example: "snakes in the curtains", but, that she does not act upset by these.  Initiation slightly improved, but, slow--given time, she will eat more. Fatigue is a factor.  Goals are supervision, except cognition, which will require min assist.   Revisions to Treatment Plan: none    Continued Need for Acute Rehabilitation Level of Care: The patient requires daily medical management by a physician with specialized training in physical medicine and rehabilitation for the following conditions: Daily direction of a multidisciplinary physical rehabilitation program to ensure safe treatment while eliciting the highest outcome that is of practical value to the patient.: Yes Daily medical management of patient stability for increased activity during participation in an intensive rehabilitation regime.: Yes Daily analysis of laboratory values and/or radiology reports with  any subsequent need for medication adjustment of medical intervention for : Post surgical problems;Pulmonary problems;Neurological problems  Brock Ra 07/14/2011, 10:41 AM

## 2011-07-14 NOTE — Progress Notes (Signed)
Occupational Therapy Session Note  Patient Details  Name: ESME DURKIN MRN: [id number] Date of Birth: [birth year]  Today's Date: 07/14/2011 Time: 0800-0900 Time Calculation (min):60 min  Precautions: Precautions Precautions: Fall Precaution Comments: trach care Required Braces or Orthoses: No Restrictions Weight Bearing Restrictions: No   Skilled Therapeutic Interventions/Progress Updates:    Pt engaged in ADL retraining including bathing and dressing w/c level at sink.  Pt required max verbal cues for initiation of all tasks with increased time to respond.  Pt continues to perseverate on tasks and requires physical redirection.  Pt physically completes tasks but does not initiate next task in a sequential manner.  Pt informed pt that she thought her family was disgusted with her because of her situation and she was disappointed with herself for letting this happen to her.  Therapist discussed with pt that her family was very proud of her for participating and doing as well as she was.  Notified LCSW regarding conversation and LCSW stated she would talk to patient.  Focus on task initiation and sequencing with sit to stand at sink.    Pt c/o BLE pain but would not rate. RN aware   Therapy/Group: Individual Therapy  Rich Brave 07/14/2011, 2:55 PM

## 2011-07-14 NOTE — Progress Notes (Signed)
Physical Therapy Session Note  Patient Details  Name: Jacqueline Christian MRN: [id number] Date of Birth: 1945-06-09  Today's Date: 07/14/2011 Time: 0905-1000 and 1308-6578 Time Calculation (min): 55 min and 25 min  Precautions: Precautions Precautions: Fall Precaution Comments: trach care Required Braces or Orthoses: No Restrictions Weight Bearing Restrictions: No   Skilled Therapeutic Interventions/Progress Updates: AM treatment focused on gait training, bed mobility.  Rolling L<>R with extra time for initiation, tactile cues.  Sit><supine with min A.  Stand pivot tranfers w/c>< bed or mat with min A, VCs for safe use of walker. Gait with RW x 75' x 87' with min A, VCs for upright posture, forward gaze, safe use of walker.  Gait speed for 10 m timed test with RW= 23 seconds, = 1.43 feet/second, risk for recurrent falls.  Pt left in room with orange safety belt on.  PM treatment: bed mobility with min A.  Pt stated she needed to use toilet.  Gait to BR in room x 25' with RW, min A.  Toilet transfer with min A.  Toileting with A for hygiene.  Pt complained of dizziness, which resolved with rest.  Cognition: pt had very short attention span for familiar functional tasks, needed extra processing time for one step commands, and perseverated on use of soap during Fedorchak washing.  She did remember this therapist's name from AM to PM treatment. Pt left in room with orange safety belt on.      Vital Signs Therapy Vitals Pulse Rate: 102  Resp: 20  Oxygen Therapy SpO2: 98 % O2 Device: None (Room air) Pain Pain Assessment Pain Assessment: 0-10 Pain Score:  10, bil knees; premedicated    Exercises:AM treatment: bil bridging over bolster , bil ankle pumps, 10 x 1 each; Nu Step at level 4 x 6 minutes with multiple short rest breaks when pt lost focus; mod tactile and VCs to resume.   PM treatment: NuStep at level 4 x 5 minutes, with multiple short rest breaks, as above.     Therapy/Group: Individual  Therapy  Jacqueline Christian 07/14/2011, 1:04 PM

## 2011-07-14 NOTE — Progress Notes (Signed)
Speech Language Pathology Daily Session Note  Patient Details  Name: Jacqueline Christian MRN: [id number] Date of Birth: 1946-04-27  Today's Date: 07/14/2011 Time: 1015-1110 Time Calculation (min): 55 min  Short Term Goals:  SLP Short Term Goal 1 (Week 1): Pt will demonstrate sustained attention for 1 minute during self-feeding task.  SLP Short Term Goal 2 (Week 1): Pt will answer yes/no questions with 75% of opportunities  SLP Short Term Goal 3 (Week 1): Pt will follow 1 step commands during a functional task with Max A verbal and gestural cues SLP Short Term Goal 4 (Week 1): Pt will initiate with Max verbal cues during a functional task.   Skilled Therapeutic Interventions: Treatment focus on sustained attention to task, initiation and functional problem solving. Pt very fatigued today secondary to morning therapy sessions. Max-Total A verbal, visual, tactile, and demonstration cues needed to initiate and sustain attention to a task. Pt both internally and externally distracted throughout session and reported, "my brain just wants to sleep." Pt perseverative during grooming task and needed Max A tactile cueing and extra time to transfer from the wheelchair to the bed.    Daily Session Precautions/Restrictions  Restrictions Weight Bearing Restrictions: No FIM:  Comprehension Comprehension: 4-Understands basic 75 - 89% of the time/requires cueing 10 - 24% of the time Expression Expression: 3-Expresses basic 50 - 74% of the time/requires cueing 25 - 50% of the time. Needs to repeat parts of sentences. Social Interaction Social Interaction: 2-Interacts appropriately 25 - 49% of time - Needs frequent redirection. Problem Solving Problem Solving: 2-Solves basic 25 - 49% of the time - needs direction more than half the time to initiate, plan or complete simple activities Memory Memory: 2-Recognizes or recalls 25 - 49% of the time/requires cueing 51 - 75% of the time Pain Pain Assessment Pain  Assessment: 0-10 Pain Score:  (Would not rate, says "it's ok")  Therapy/Group: Individual Therapy  Aki Abalos 07/14/2011, 2:20 PM

## 2011-07-14 NOTE — Progress Notes (Signed)
Patient ID: Jacqueline Christian, female   DOB: Jan 08, 1946, 66 y.o.   MRN: [id number] Patient ID: Jacqueline Christian, female   DOB: 06-09-1945, 66 y.o.   MRN: [id number] Subjective/Complaints: Review of Systems  Constitutional: Positive for malaise/fatigue.  All other systems reviewed and are negative.  2/6--HAS no complaints. Slept well. Did not mention any hallucinations although staff reports these at times.  Pt seems to be aware they are not real to some extent.  Objective: Vital Signs: Blood pressure 130/81, pulse 80, temperature 98.4 F (36.9 C), temperature source Oral, resp. rate 16, height 5\' 3"  (1.6 m), weight 77.565 kg (171 lb), SpO2 93.00%. No results found. No results found for this basename: WBC:2,HGB:2,HCT:2,PLT:2 in the last 72 hours No results found for this basename: NA:2,K:2,CL:2,CO2:2,GLUCOSE:2,BUN:2,CREATININE:2,CALCIUM:2 in the last 72 hours CBG (last 3)  No results found for this basename: GLUCAP:3 in the last 72 hours  Wt Readings from Last 3 Encounters:  07/07/11 77.565 kg (171 lb)  07/07/11 77.7 kg (171 lb 4.8 oz)  07/07/11 77.7 kg (171 lb 4.8 oz)   BP Readings from Last 3 Encounters:  07/14/11 130/81  07/07/11 120/48  07/07/11 120/48   Physical Exam:  General appearance: distracted, no distress and slowed mentation Head: Normocephalic, without obvious abnormality, atraumatic Eyes: conjunctivae/corneas clear. PERRL, EOM's intact. Fundi benign. Ears: normal TM's and external ear canals both ears Nose: Nares normal. Septum midline. Mucosa normal. No drainage or sinus tenderness. Throat: lips, mucosa, and tongue normal; teeth and gums normal. Trach site already closing.. Neck: no adenopathy, no carotid bruit, no JVD, supple, symmetrical, trachea midline and thyroid not enlarged, symmetric, no tenderness/mass/nodules Back: symmetric, no curvature. ROM normal. No CVA tenderness. Resp: clear to auscultation bilaterally Cardio: heart regular without murmur. GI: soft,  non-tender; bowel sounds normal; no masses,  no organomegaly Extremities: extremities normal, atraumatic, no cyanosis or edema Pulses: 2+ and symmetric Skin: Skin color, texture, turgor normal. No rashes or lesions Neurologic: left field cut. Alert considering time of exam. Answers biographical questions with a little delay.  Knows she's in the hospital. Incision/Wound: wound on scalp clean and intact Exam 2/6  Assessment/Plan: 1. Functional deficits secondary to meningioma with post op ICH which require 3+ hours per day of interdisciplinary therapy in a comprehensive inpatient rehab setting. Physiatrist is providing close team supervision and 24 hour management of active medical problems listed below. Physiatrist and rehab team continue to assess barriers to discharge/monitor patient progress toward functional and medical goals.   FIM: FIM - Bathing Bathing Steps Patient Completed: Chest;Right Arm;Left Arm;Abdomen;Front perineal area;Right upper leg;Left upper leg;Buttocks Bathing: 0: Activity did not occur  FIM - Upper Body Dressing/Undressing Upper body dressing/undressing steps patient completed: Thread/unthread right bra strap;Thread/unthread left bra strap;Thread/unthread right sleeve of pullover shirt/dresss;Thread/unthread left sleeve of pullover shirt/dress;Put head through opening of pull over shirt/dress;Pull shirt over trunk Upper body dressing/undressing: 0: Activity did not occur FIM - Lower Body Dressing/Undressing Lower body dressing/undressing steps patient completed: Thread/unthread right underwear leg;Thread/unthread left underwear leg;Pull underwear up/down;Thread/unthread right pants leg;Thread/unthread left pants leg;Pull pants up/down Lower body dressing/undressing: 0: Activity did not occur  FIM - Toileting Toileting steps completed by patient: Performs perineal hygiene Toileting Assistive Devices: Grab bar or rail for support Toileting: 0: Activity did not  occur  FIM - Diplomatic Services operational officer Devices: Psychiatrist Transfers: 0-Activity did not occur  FIM - Banker Devices: Bed rails Bed/Chair Transfer: 0: Activity did not occur  FIM - Locomotion:  Wheelchair Locomotion: Wheelchair: 0: Activity did not occur FIM - Locomotion: Ambulation Locomotion: Ambulation Assistive Devices: Designer, industrial/product Ambulation/Gait Assistance: 4: Min assist Locomotion: Ambulation: 0: Activity did not occur  Comprehension Comprehension Mode: Auditory;Visual Comprehension: 4-Understands basic 75 - 89% of the time/requires cueing 10 - 24% of the time  Expression Expression Mode: Verbal Expression: 3-Expresses basic 50 - 74% of the time/requires cueing 25 - 50% of the time. Needs to repeat parts of sentences.  Social Interaction Social Interaction: 4-Interacts appropriately 75 - 89% of the time - Needs redirection for appropriate language or to initiate interaction.  Problem Solving Problem Solving: 2-Solves basic 25 - 49% of the time - needs direction more than half the time to initiate, plan or complete simple activities  Memory Memory: 1-Recognizes or recalls less than 25% of the time/requires cueing greater than 75% of the time   1. DVT Prophylaxis/Anticoagulation: Mechanical: Sequential compression devices, below knee Bilateral lower extremities. BLE doppler negative.  2. Pain Management: Unable to determine due to cognitive status. Denies pain currently. ?Intermittent headaches per family.  3. Mood: Currently with flat affect and poor awareness. Will have LSW follow up with patient and husband for evaluation/support. A lot of her appearance is likely neurologically related.  Monitor closely   -ritalin trial to boost attention and arousal. Increased to 10mg  yesterday and it's helping. 4. Sphenoid wing meningioma with vision loss- left crani for debulking/resection of tumor.  5.  Resection of tumor with right frontal ICH- on keppra for seizure prophylaxis. -Normalize sleep cycle.  6. Respiratory failure requiring trach: has #4 trach. No problems with capping last night. DC trach today. 7. Staph UTI: abx completed  8. ABLA: Add iron supplement. Recheck hgb stable. 9. HTN: Off tenormin currenty as blood pressures occasionally low. Monitor with bid checks and resume if blood pressures remain elevated. 10. FEN-eating well.  11. Hallucinations likely are a combination of cognitive impairments as well as visual deficits. She has awareness to some extent that they aren't reality. She is non-agitated and isn't psychotic.  Continue to observe only for now. These should gradually resolve.  LOS (Days) 7 A FACE TO FACE EVALUATION WAS PERFORMED  Teion Ballin T 07/14/2011, 7:05 AM

## 2011-07-15 MED ORDER — ENSURE CLINICAL ST REVIGOR PO LIQD
237.0000 mL | Freq: Two times a day (BID) | ORAL | Status: DC
  Administered 2011-07-15 – 2011-07-22 (×13): 237 mL via ORAL
  Administered 2011-07-22: 21:00:00 via ORAL
  Administered 2011-07-23 – 2011-07-24 (×4): 237 mL via ORAL
  Administered 2011-07-25: 21:00:00 via ORAL
  Administered 2011-07-25: 237 mL via ORAL
  Administered 2011-07-26: 09:00:00 via ORAL
  Administered 2011-07-26 – 2011-07-28 (×4): 237 mL via ORAL

## 2011-07-15 MED ORDER — ACETAMINOPHEN 500 MG PO TABS
500.0000 mg | ORAL_TABLET | Freq: Three times a day (TID) | ORAL | Status: DC
  Administered 2011-07-15 – 2011-07-28 (×36): 500 mg via ORAL
  Filled 2011-07-15 (×44): qty 1

## 2011-07-15 MED ORDER — ENSURE PUDDING PO PUDG
1.0000 | Freq: Two times a day (BID) | ORAL | Status: DC
  Administered 2011-07-17 – 2011-07-27 (×17): 1 via ORAL

## 2011-07-15 NOTE — Progress Notes (Signed)
Patient Details  Name: Jacqueline Christian MRN: [id number] Date of Birth: 19-Apr-1946  Today's Date: 07/15/2011 Time: 1330-1400  Skilled Therapeutic Interventions/Progress Updates: Ambulated with RW from Family Room to room with min assist.  Session focused on family education pertaining to ambulation, community pursuits, energy conservation, and overall safety.  C/o LE pain, RN aware. Therapy/Group: Individual Therapy  Activity Level: Simple:  Level of assist: Min Assist  Sandro Burgo 07/15/2011, 4:24 PM

## 2011-07-15 NOTE — Progress Notes (Signed)
Patient ID: Jacqueline Christian, female   DOB: 1945/08/22, 66 y.o.   MRN: [id number] Subjective/Complaints: Review of Systems  Constitutional: Positive for malaise/fatigue.  All other systems reviewed and are negative.  2/7- legs sore/throbbing. Slept ok.  Objective: Vital Signs: Blood pressure 124/78, pulse 86, temperature 98.4 F (36.9 C), temperature source Oral, resp. rate 16, height 5\' 3"  (1.6 m), weight 77.565 kg (171 lb), SpO2 92.00%. No results found. No results found for this basename: WBC:2,HGB:2,HCT:2,PLT:2 in the last 72 hours No results found for this basename: NA:2,K:2,CL:2,CO2:2,GLUCOSE:2,BUN:2,CREATININE:2,CALCIUM:2 in the last 72 hours CBG (last 3)  No results found for this basename: GLUCAP:3 in the last 72 hours  Wt Readings from Last 3 Encounters:  07/07/11 77.565 kg (171 lb)  07/07/11 77.7 kg (171 lb 4.8 oz)  07/07/11 77.7 kg (171 lb 4.8 oz)   BP Readings from Last 3 Encounters:  07/15/11 124/78  07/07/11 120/48  07/07/11 120/48   Physical Exam:  General appearance: distracted, no distress and slowed mentation Head: Normocephalic, without obvious abnormality, atraumatic Eyes: conjunctivae/corneas clear. PERRL, EOM's intact. Fundi benign. Ears: normal TM's and external ear canals both ears Nose: Nares normal. Septum midline. Mucosa normal. No drainage or sinus tenderness. Throat: lips, mucosa, and tongue normal; teeth and gums normal. Trach site closed.. Neck: no adenopathy, no carotid bruit, no JVD, supple, symmetrical, trachea midline and thyroid not enlarged, symmetric, no tenderness/mass/nodules Back: symmetric, no curvature. ROM normal. No CVA tenderness. Resp: clear to auscultation bilaterally Cardio: heart regular without murmur. GI: soft, non-tender; bowel sounds normal; no masses,  no organomegaly Extremities: extremities normal, atraumatic, no cyanosis or edema Pulses: 2+ and symmetric Skin: Skin color, texture, turgor normal. No rashes or  lesions Neurologic:doesn't appear to have field cut as she attended to stimuli on either side. Alert but flat. Answers biographical questions with a little delay. Moves all 4's. Processing delays  Knows she's in the hospital. Incision/Wound: wound on scalp clean and intact Musc: legs non edematous and really minimally tender to touch. Exam 2/7  Assessment/Plan: 1. Functional deficits secondary to meningioma with post op ICH which require 3+ hours per day of interdisciplinary therapy in a comprehensive inpatient rehab setting. Physiatrist is providing close team supervision and 24 hour management of active medical problems listed below. Physiatrist and rehab team continue to assess barriers to discharge/monitor patient progress toward functional and medical goals.   FIM: FIM - Bathing Bathing Steps Patient Completed: Chest;Right Arm;Left Arm;Abdomen;Front perineal area;Buttocks;Right upper leg;Left upper leg Bathing: 4: Min-Patient completes 8-9 42f 10 parts or 75+ percent  FIM - Upper Body Dressing/Undressing Upper body dressing/undressing steps patient completed: Thread/unthread right bra strap;Thread/unthread left bra strap;Hook/unhook bra;Thread/unthread right sleeve of pullover shirt/dresss;Thread/unthread left sleeve of pullover shirt/dress;Put head through opening of pull over shirt/dress;Pull shirt over trunk Upper body dressing/undressing: 5: Supervision: Safety issues/verbal cues FIM - Lower Body Dressing/Undressing Lower body dressing/undressing steps patient completed: Thread/unthread right underwear leg;Thread/unthread left underwear leg;Pull underwear up/down;Thread/unthread right pants leg;Thread/unthread left pants leg;Pull pants up/down Lower body dressing/undressing: 4: Min-Patient completed 75 plus % of tasks  FIM - Toileting Toileting steps completed by patient: Adjust clothing prior to toileting;Performs perineal hygiene;Adjust clothing after toileting Toileting Assistive  Devices: Grab bar or rail for support Toileting: 3: Mod-Patient completed 2 of 3 steps  FIM - Diplomatic Services operational officer Devices: Art gallery manager Transfers: 4-From toilet/BSC: Min A (steadying Pt. > 75%)  FIM - Bed/Chair Transfer Bed/Chair Transfer Assistive Devices: Manufacturing systems engineer Transfer: 4: Supine > Sit: Min A (steadying  Pt. > 75%/lift 1 leg);3: Sit > Supine: Mod A (lifting assist/Pt. 50-74%/lift 2 legs);4: Bed > Chair or W/C: Min A (steadying Pt. > 75%)  FIM - Locomotion: Wheelchair Locomotion: Wheelchair: 0: Activity did not occur FIM - Locomotion: Ambulation Locomotion: Ambulation Assistive Devices: Designer, industrial/product Ambulation/Gait Assistance: 4: Min assist Locomotion: Ambulation: 2: Travels 50 - 149 ft with minimal assistance (Pt.>75%)  Comprehension Comprehension Mode: Auditory;Visual Comprehension: 4-Understands basic 75 - 89% of the time/requires cueing 10 - 24% of the time  Expression Expression Mode: Verbal Expression: 5-Expresses basic 90% of the time/requires cueing < 10% of the time.  Social Interaction Social Interaction: 2-Interacts appropriately 25 - 49% of time - Needs frequent redirection.  Problem Solving Problem Solving: 2-Solves basic 25 - 49% of the time - needs direction more than half the time to initiate, plan or complete simple activities  Memory Memory: 2-Recognizes or recalls 25 - 49% of the time/requires cueing 51 - 75% of the time   1. DVT Prophylaxis/Anticoagulation: Mechanical: Sequential compression devices, below knee Bilateral lower extremities. BLE doppler negative.  2. Pain Management: add tylenol scheduled for leg symptoms, likely muscular 3. Mood: Currently with flat affect and poor awareness. Ego support by team  -ritalin trial to boost attention and arousal. Increased to 10mg  yesterday and it's helping. 4. Sphenoid wing meningioma with vision loss- left crani for debulking/resection of tumor.  5. Resection of tumor  with right frontal ICH- on keppra for seizure prophylaxis. -Normalize sleep cycle.  6. Respiratory failure requiring trach: trach out without issues. 7. Staph UTI: abx completed  8. ABLA: Add iron supplement. Recheck hgb stable. 9. HTN: Off tenormin currenty as blood pressures occasionally low. Monitor with bid checks and resume if blood pressures remain elevated. 10. FEN-eating well.  11. Hallucinations likely are a combination of cognitive impairments as well as visual deficits. She has awareness to some extent that they aren't reality. She is non-agitated and isn't psychotic.  Continue to observe only for now. These should gradually resolve.  LOS (Days) 8 A FACE TO FACE EVALUATION WAS PERFORMED  Shellia Hartl T 07/15/2011, 8:25 AM

## 2011-07-15 NOTE — Progress Notes (Signed)
Speech Language Pathology Weekly Progress Note  Patient Details  Name: Jacqueline Christian MRN: [id number] Date of Birth: 02/24/1946  Today's Date: 07/15/2011 Time: 0800-0900 Time Calculation (min): 60 min  Short Term Goals: Week 1: SLP Short Term Goal 1 (Week 1): Pt will demonstrate sustained attention for 1 minute during self-feeding task.  SLP Short Term Goal 1 - Progress (Week 1): Progressing toward goal SLP Short Term Goal 2 (Week 1): Pt will answer yes/no questions with 75% of opportunities  SLP Short Term Goal 2 - Progress (Week 1): Met SLP Short Term Goal 3 (Week 1): Pt will follow 1 step commands during a functional task with Max A verbal and gestural cues SLP Short Term Goal 3 - Progress (Week 1): Met SLP Short Term Goal 4 (Week 1): Pt will initiate with Max verbal cues during a functional task.  SLP Short Term Goal 4 - Progress (Week 1): Met  New Short Term Goals: Week 2: SLP Short Term Goal 1 (Week 2): Pt will sustain attention to a functional task (grooming, self-feeding) for 1 minute with Max verbal cues SLP Short Term Goal 2 (Week 2): Pt will initiate with Mod verbal and visual cues during a functional task. SLP Short Term Goal 3 (Week 2): Pt will demonstrate functional problem solving with basic and familiar tasks with Mod A verbal and visual cues.  SLP Short Term Goal 4 (Week 2): Pt will follow 1 step commands with Mod verbal and gestural cues.   Weekly Progress Updates: Pt has made functional gains this week and has met 3 out of 4 STG's. Currently, pt is overall Max A verbal, visual, tactile and gestural cueing for sustained attention and initiation of functional tasks. Pt has overall improved working memory and can recall daily therapy events and therapists. Pt also demonstrating improved intellectual and emergent awareness and insight into deficits during functional tasks. Pt has been decannulated and is verbalizing wants/needs with Min questioning cues and tolerating a regular  diet with thin liquids. Pt would benefit from continued skilled SLP services to maximize cognitive function and overall independence for discharge plan to home.  Daily Session Therapeutic Intervention: Treatment focus on sustained attention and initiation of a functional task. Max A verbal, visual and gestural cues needed for initiation of bites of breakfast. Pt independent for initiation of liquid. Pt needed Max verbal and visual cues for redirection to task secondary to internally distracted by pain in legs and itching. Pt able to recall/carryover newly learned information to end of session with supervision semantic questioning cues.  FIM:  Comprehension Comprehension Mode: Auditory Comprehension: 4-Understands basic 75 - 89% of the time/requires cueing 10 - 24% of the time Expression Expression Mode: Verbal Expression: 4-Expresses basic 75 - 89% of the time/requires cueing 10 - 24% of the time. Needs helper to occlude trach/needs to repeat words. Social Interaction Social Interaction: 2-Interacts appropriately 25 - 49% of time - Needs frequent redirection. Problem Solving Problem Solving: 2-Solves basic 25 - 49% of the time - needs direction more than half the time to initiate, plan or complete simple activities Memory Memory: 3-Recognizes or recalls 50 - 74% of the time/requires cueing 25 - 49% of the time FIM - Eating Eating Activity: 4: Helper occasionally scoops food on utensil Pain Pain Assessment Pain Assessment: 0-10 Pain Score:   6 Pain Type: Acute pain Pain Location: Leg Pain Orientation: Right;Left Pain Descriptors: Aching Pain Onset: Gradual Patients Stated Pain Goal: 2 Pain Intervention(s): RN made aware Cognition: Overall Cognitive  Status: Impaired Arousal/Alertness: Awake/alert Orientation Level: Disoriented to time Attention: Sustained Focused Attention: Appears intact Sustained Attention: Impaired Sustained Attention Impairment: Verbal basic;Functional  basic Memory: Impaired Memory Impairment: Decreased recall of new information;Decreased short term memory Decreased Short Term Memory: Verbal basic;Functional basic Problem Solving: Impaired Problem Solving Impairment: Verbal basic;Functional basic Executive Function: Initiating Initiating: Impaired Initiating Impairment: Verbal basic;Functional basic Behaviors: Perseveration Safety/Judgment: Impaired Comments: Pt easily distracted by enternal and external factors Oral/Motor: Oral Motor/Sensory Function Overall Oral Motor/Sensory Function: Impaired Labial ROM: Within Functional Limits Labial Symmetry: Within Functional Limits Labial Strength: Within Functional Limits Lingual ROM: Within Functional Limits Lingual Symmetry: Within Functional Limits Lingual Strength: Reduced Facial ROM: Within Functional Limits Facial Symmetry: Within Functional Limits Facial Strength: Within Functional Limits Facial Sensation: Within Functional Limits Velum: Within Functional Limits Mandible: Within Functional Limits Motor Speech Overall Motor Speech: Appears within functional limits for tasks assessed Respiration: Within functional limits Phonation: Normal Resonance: Within functional limits Articulation: Within functional limitis Intelligibility: Intelligible Motor Planning: Witnin functional limits Comprehension: Auditory Comprehension Overall Auditory Comprehension: Impaired Yes/No Questions: Within Functional Limits Basic Biographical Questions: 76-100% accurate Basic Immediate Environment Questions: 75-100% accurate Commands: Impaired One Step Basic Commands: 75-100% accurate Two Step Basic Commands: 50-74% accurate Conversation: Simple Other Conversation Comments: Pt reposnded to ~50% of questions. Pt answered yes/no biorgraphical questions correctly and verbalized the names of her children and husband. Deficits impacted by attention and iniation.  Interfering Components:  Attention;Pain;Processing speed;Working Radio broadcast assistant: Buyer, retail: Within [employer] Reading Comprehension Reading Status: Not tested Expression: Expression Primary Mode of Expression: Verbal Verbal Expression Overall Verbal Expression: Impaired Initiation: Impaired Automatic Speech: Social Response Level of Generative/Spontaneous Verbalization: Sentence Repetition: No impairment Naming: No impairment Pragmatics: Impairment Impairments: Abnormal affect;Eye contact Interfering Components: Attention Effective Techniques: Open ended questions Other Verbal Expression Comments: Pt will initiate basic needs. "I need to go to the bathroom."  Written Expression Written Expression: Not tested  Therapy/Group: Individual Therapy   [place] 07/15/2011, 9:35 AM

## 2011-07-15 NOTE — Progress Notes (Signed)
Nutrition Follow-up  Intake poor. Husband feels that intake is poor 2/2 appetite and attention. Pt refused breakfast today. Pt states that appetite has been "bad since this brain tumor." Pt also says she consumed Ensure supplements PTA.  Diet Order:  Regular Supplements: Ensure Clinical Strength daily  Meds: Scheduled Meds:   . acetaminophen  500 mg Oral TID  . antiseptic oral rinse  15 mL Mouth Rinse QID  . chlorhexidine  15 mL Mouth Rinse BID  . famotidine  20 mg Oral BID  . feeding supplement  237 mL Oral Daily  . ferrous sulfate  325 mg Oral BID  . levETIRAcetam  500 mg Per Tube BID  . methylphenidate  10 mg Oral BID  . naphazoline-glycerin  1-2 drop Both Eyes QID  . nystatin   Topical TID  . pantoprazole  40 mg Oral Q1200   Continuous Infusions:  PRN Meds:.acetaminophen, alum & mag hydroxide-simeth, bisacodyl, diphenhydrAMINE, guaiFENesin-dextromethorphan, hydrocerin, HYDROcodone-acetaminophen, polyethylene glycol, polyethylene glycol, promethazine, promethazine, promethazine, traZODone  Labs:  CMP     Component Value Date/Time   NA 141 07/08/2011 0625   K 3.8 07/08/2011 0625   CL 103 07/08/2011 0625   CO2 27 07/08/2011 0625   GLUCOSE 103* 07/08/2011 0625   BUN 13 07/08/2011 0625   CREATININE 0.73 07/08/2011 0625   CALCIUM 9.9 07/08/2011 0625   PROT 7.0 07/08/2011 0625   ALBUMIN 3.2* 07/08/2011 0625   AST 18 07/08/2011 0625   ALT 37* 07/08/2011 0625   ALKPHOS 102 07/08/2011 0625   BILITOT 0.4 07/08/2011 0625   GFRNONAA 87* 07/08/2011 0625   GFRAA >90 07/08/2011 0625     Intake/Output Summary (Last 24 hours) at 07/15/11 1021 Last data filed at 07/15/11 0900  Gross per 24 hour  Intake    720 ml  Output      0 ml  Net    720 ml    Weight Status:  77.6 kg, no new wt  Estimated needs:  1550 - 1700 kcal, 78 - 90 grams protein  Nutrition Dx:  Increased energy expenditure - persists.  Goal:  Pt to consume >/= 75% of meals. Not met.  Intervention:   1. Increase Ensure  Clinical Strength to PO BID between meals 2. Ensure Pudding PO BID 3. RD discussed food preferences, will add yogurt to trays 4. RD to follow nutrition care plan  Monitor:  Weights, labs, PO intake, tolerance of supplements  [place] [facility] Pager #:   [phone]

## 2011-07-15 NOTE — Progress Notes (Signed)
Physical Therapy Weekly Progress Note  Patient Details  Name: Jacqueline Christian MRN: [id number] Date of Birth: January 24, 1946  Today's Date: 07/15/2011 Time: 13:00- 13:30  Patient has met 3 of 3 short term goals.  Pt is currently ambulating with rw min assist with vc for left inattention. Pt presents with decreaed activity tolerance, but vital signs have remained in safe limits with activities with O2 at 100% on RA. Pt requires mod to max question or direct cues for initiation and sustatined attention for all activities.  Patient continues to demonstrate the following deficits due to decreased sustained attention, difficulty initiating, decreased safety awareness and decreased activity tolerance,  and therefore will continue to benefit from skilled PT intervention to enhance overall performance with activity tolerance, attention and awareness.  Patient progressing toward long term goals..  Continue plan of care.  PT Short Term Goals PT Short Term Goal 1: pt will ambulate 42' with min assist PT Short Term Goal 1 - Progress: Met PT Short Term Goal 2: pt will ambulate 4 steps with min assist PT Short Term Goal 2 - Progress: Met PT Short Term Goal 3: pt will demonstrate sustained attention for 1 minute with max vc PT Short Term Goal 3 - Progress: Met  Therapy Documentation Precautions: Precautions Precautions: Fall Precaution Comments: trach care Required Braces or Orthoses: No Restrictions Weight Bearing Restrictions: No       l   Vital Signs BP 140/78, SaO2 100% on RA HR93bpm   Pain: unrated pain in LEs requested pain medication from nursing. Mobility gait training in home environment with min assist initially using RW, however pt with difficulty with divided attention while attempting to gather clothing and put RW to the side.  Gait training in hallway 150' with RW, min guard assist with vc for left inattention to space.                   Other Treatments  family education:  discussed with husband and sister use of counting aloud to initiate pt moving sit to stand; also discussed left inattention, and perseveration of topics and activities  Therapy/Group: Individual Therapy  [name] 07/15/2011, 3:10 PM

## 2011-07-15 NOTE — Progress Notes (Signed)
Occupational Therapy Session Note and Weekly Progress Note  Patient Details  Name: SIMONA ROCQUE MRN: [id number] Date of Birth: [birth year]  Today's Date: 07/15/2011 Time: 0900-1000 Time Calculation (min): 60 min   Session Note Pt in recliner and responded positively when engaged.  Pt participated in ADL retraining with bathing at shower level and dressing with sit to stand from EOB.  Pt completed bathing tasks will decreased verbal cues for initiation but continues to require min verbal cues for initiation.  Pt continues to perseverate washing legs and brushing teeth, requiring max verbal cues for redirection.  Pt stated that she sometimes forget what she is doing or where she is going when ambulating with therapists.  Pt stood at sink to brush teeth.  Weekly Progress Note Patient has met 5 of 5 short term goals.  Pt is min A for bathing, supervision for UB dsg, steady assist for LB dsg, and min A/steady A for toilet transfers and shower transfers.  Pt continues to require min to max verbal cues for task initiation and redirection.    Patient continues to demonstrate the following deficits: decreased initiation, internal distraction, decreased attention, simple problem solving, awareness, perseveration, limited verbal input from decreased initiation impacting her ability to carry out basic functional ADLs,  therefore will continue to benefit from skilled OT intervention to enhance overall performance with ADL.  Patient progressing toward long term goals..  Continue plan of care.  OT Short Term Goals OT Short Term Goal 1: Pt will don shirt with min A with cuing  OT Short Term Goal 1 - Progress: Met OT Short Term Goal 2: Pt will perform sit to stand in an automatic situation (ie toileting)  with min A OT Short Term Goal 2 - Progress: Met OT Short Term Goal 3: Pt will maintain standing with min A with at least one UE support during functional tasks OT Short Term Goal 3 - Progress: Met OT Short  Term Goal 4: Pt will initate with mod cuing with a grooming tasks at the sink OT Short Term Goal 4 - Progress: Met OT Short Term Goal 5: Pt will attend to left side with mod cuing during functional task  Therapy Documentation Precautions: Precautions Precautions: Fall Precaution Comments: trach care Required Braces or Orthoses: No Restrictions Weight Bearing Restrictions: No  Skilled Therapeutic Interventions/Progress Updates:    1:1 Participated in kitchen task of making soup and grilled cheese focus on: sustained to selective attention, following one step commands, simple problem solving, awareness, intellectual awareness, initiation. Pt needed mod to max cuing throughout session to complete eating grilled cheese and soup. Used contextual cues of tapping spoon on bowel, picking up her sandwich, verbal cue for her to initiate to complete eating her lunch, however required less cuing during preperation of food. Played soft music during session  Pain  No c/o Therapy/Group: Individual Therapy  Rich Brave 07/15/2011, 12:16 PM

## 2011-07-16 MED ORDER — WHITE PETROLATUM GEL
Status: AC
  Filled 2011-07-16: qty 5

## 2011-07-16 NOTE — Progress Notes (Signed)
Physical Therapy Note  Patient Details  Name: Jacqueline Christian MRN: [id number] Date of Birth: [birth year] Today's Date: 07/16/2011  9:30- 10:15 individual therapy pt complained of pain in legs. TED hose applied to decrease pain. Gait training with RW focusing on gathering horseshoes with min assist both sides and dynamic balance to hang towels on line with min assist. Pt required min cues today for susatined attention and did not require counting for sit to stand. Pt complained of SOB O2 was 100% and HR was 130bpm.    [name] 07/16/2011, 12:28 PM

## 2011-07-16 NOTE — Plan of Care (Signed)
Problem: RH BOWEL ELIMINATION Goal: RH STG MANAGE BOWEL W/MEDICATION W/ASSISTANCE STG Manage Bowel with Medication with Assistance. Mod I  Outcome: Progressing Goal changed to min. assist  Problem: RH BLADDER ELIMINATION Goal: RH STG MANAGE BLADDER WITH ASSISTANCE STG Manage Bladder With Assistance mod I  Outcome: Progressing Goal changed to min assist

## 2011-07-16 NOTE — Progress Notes (Signed)
Physical Therapy Note  Patient Details  Name: Jacqueline Christian MRN: [id number] Date of Birth: 06-14-1945 Today's Date: 07/16/2011  13:00-13:35 individual therapy pt complained of pain in legs unrated. Pt premedicated. Gait training x 120' x2 with Christian assist with RW. Attempted to [facility] around obstacles but pt stated she was nauseous and dizzy and needed to return to the room.    [name] 07/16/2011, 3:04 PM

## 2011-07-16 NOTE — Progress Notes (Signed)
Patient ID: [name], female   DOB: [birth year], 66 y.o.   MRN: [id number] Patient ID: ASHIMA SHRAKE, female   DOB: 1945-06-13, 66 y.o.   MRN: [id number] Subjective/Complaints: Review of Systems  Constitutional: Positive for malaise/fatigue.  All other systems reviewed and are negative.  2/8--some leg pain. .  Objective: Vital Signs: Blood pressure 116/58, pulse 79, temperature 98.5 F (36.9 C), temperature source Oral, resp. rate 18, height 5\' 3"  (1.6 m), weight 77.565 kg (171 lb), SpO2 96.00%. No results found. No results found for this basename: WBC:2,HGB:2,HCT:2,PLT:2 in the last 72 hours No results found for this basename: NA:2,K:2,CL:2,CO2:2,GLUCOSE:2,BUN:2,CREATININE:2,CALCIUM:2 in the last 72 hours CBG (last 3)  No results found for this basename: GLUCAP:3 in the last 72 hours  Wt Readings from Last 3 Encounters:  07/07/11 77.565 kg (171 lb)  07/07/11 77.7 kg (171 lb 4.8 oz)  07/07/11 77.7 kg (171 lb 4.8 oz)   BP Readings from Last 3 Encounters:  07/16/11 116/58  07/07/11 120/48  07/07/11 120/48   Physical Exam:  General appearance: distracted, no distress and slowed mentation Head: Normocephalic, without obvious abnormality, atraumatic Eyes: conjunctivae/corneas clear. PERRL, EOM's intact. Fundi benign. Ears: normal TM's and external ear canals both ears Nose: Nares normal. Septum midline. Mucosa normal. No drainage or sinus tenderness. Throat: lips, mucosa, and tongue normal; teeth and gums normal. Trach site closed.. Neck: no adenopathy, no carotid bruit, no JVD, supple, symmetrical, trachea midline and thyroid not enlarged, symmetric, no tenderness/mass/nodules Back: symmetric, no curvature. ROM normal. No CVA tenderness. Resp: clear to auscultation bilaterally Cardio: heart regular without murmur. GI: soft, non-tender; bowel sounds normal; no masses,  no organomegaly Extremities: extremities normal, atraumatic, no cyanosis or edema Pulses: 2+ and  symmetric Skin: Skin color, texture, turgor normal. No rashes or lesions Neurologic:doesn't appear to have field cut as she attended to stimuli on either side. Alert but flat. Answers biographical questions with a little delay. Moves all 4's. Processing delays  Knows she's in the hospital. Incision/Wound: wound on scalp clean and intact Musc: legs non edematous and really minimally tender to touch. Exam 2/8  Assessment/Plan: 1. Functional deficits secondary to meningioma with post op ICH which require 3+ hours per day of interdisciplinary therapy in a comprehensive inpatient rehab setting. Physiatrist is providing close team supervision and 24 hour management of active medical problems listed below. Physiatrist and rehab team continue to assess barriers to discharge/monitor patient progress toward functional and medical goals.   FIM: FIM - Bathing Bathing Steps Patient Completed: Chest;Right Arm;Left Arm;Abdomen;Front perineal area;Buttocks;Right upper leg;Left upper leg Bathing: 4: Min-Patient completes 8-9 55f 10 parts or 75+ percent  FIM - Upper Body Dressing/Undressing Upper body dressing/undressing steps patient completed: Thread/unthread right bra strap;Thread/unthread left bra strap;Thread/unthread right sleeve of pullover shirt/dresss;Thread/unthread left sleeve of pullover shirt/dress;Put head through opening of pull over shirt/dress;Pull shirt over trunk Upper body dressing/undressing: 5: Supervision: Safety issues/verbal cues FIM - Lower Body Dressing/Undressing Lower body dressing/undressing steps patient completed: Thread/unthread right underwear leg;Thread/unthread left underwear leg;Pull underwear up/down;Thread/unthread right pants leg;Thread/unthread left pants leg;Pull pants up/down;Don/Doff left sock;Don/Doff right shoe Lower body dressing/undressing: 4: Steadying Assist  FIM - Toileting Toileting steps completed by patient: Adjust clothing prior to toileting;Performs  perineal hygiene;Adjust clothing after toileting Toileting Assistive Devices: Grab bar or rail for support Toileting: 4: Steadying assist  FIM - Diplomatic Services operational officer Devices: Bedside commode Toilet Transfers: 4-From toilet/BSC: Min A (steadying Pt. > 75%)  FIM - Banker Devices:  Walker Bed/Chair Transfer: 4: Supine > Sit: Min A (steadying Pt. > 75%/lift 1 leg);3: Sit > Supine: Mod A (lifting assist/Pt. 50-74%/lift 2 legs);4: Bed > Chair or W/C: Min A (steadying Pt. > 75%)  FIM - Locomotion: Wheelchair Locomotion: Wheelchair: 0: Activity did not occur FIM - Locomotion: Ambulation Locomotion: Ambulation Assistive Devices: Designer, industrial/product Ambulation/Gait Assistance: 4: Min assist Locomotion: Ambulation: 4: Travels 150 ft or more with minimal assistance (Pt.>75%)  Comprehension Comprehension Mode: Auditory Comprehension: 4-Understands basic 75 - 89% of the time/requires cueing 10 - 24% of the time  Expression Expression Mode: Verbal Expression: 3-Expresses basic 50 - 74% of the time/requires cueing 25 - 50% of the time. Needs to repeat parts of sentences.  Social Interaction Social Interaction: 2-Interacts appropriately 25 - 49% of time - Needs frequent redirection.  Problem Solving Problem Solving: 2-Solves basic 25 - 49% of the time - needs direction more than half the time to initiate, plan or complete simple activities  Memory Memory: 2-Recognizes or recalls 25 - 49% of the time/requires cueing 51 - 75% of the time   1. DVT Prophylaxis/Anticoagulation: Mechanical: Sequential compression devices, below knee Bilateral lower extremities. BLE doppler negative.  2. Pain Management: added tylenol scheduled for leg symptoms, likely muscular. Exam inconsistent. 3. Mood: Currently with flat affect and poor awareness. Ego support by team  -ritalin trial to boost attention and arousal. Increased to 10mg  with positive  results. 4. Sphenoid wing meningioma with vision loss- left crani for debulking/resection of tumor.  5. Resection of tumor with right frontal ICH- on keppra for seizure prophylaxis. -Normalize sleep cycle.  6. Respiratory failure requiring trach: trach out without issues. 7. Staph UTI: abx completed  8. ABLA: Add iron supplement. Recheck hgb stable. 9. HTN: Off tenormin currenty as blood pressures occasionally low. Monitor with bid checks and resume if blood pressures remain elevated. 10. FEN-eating well.  11. Hallucinations likely are a combination of cognitive impairments as well as visual deficits. She has awareness to some extent that they aren't reality. She is non-agitated and isn't psychotic.  Should gradually improve.  LOS (Days) 9 A FACE TO FACE EVALUATION WAS PERFORMED  SWARTZ,ZACHARY T 07/16/2011, 6:54 AM

## 2011-07-16 NOTE — Progress Notes (Signed)
Note reviewed and accurately reflects treatment session.   

## 2011-07-16 NOTE — Progress Notes (Signed)
Occupational Therapy Session Note  Patient Details  Name: Jacqueline Christian MRN: [id number] Date of Birth: 1945-11-10  Today's Date: 07/16/2011 Time: 0700-0800 Time Calculation (min): 60 min  Precautions: Precautions Precautions: Fall Precaution Comments: trach care Required Braces or Orthoses: No Restrictions Weight Bearing Restrictions: No  Short Term Goals: OT Short Term Goal 1: Pt will don shirt with min A with cuing  OT Short Term Goal 1 - Progress: Met OT Short Term Goal 2: Pt will perform sit to stand in an automatic situation (ie toileting)  with min A OT Short Term Goal 2 - Progress: Met OT Short Term Goal 3: Pt will maintain standing with min A with at least one UE support during functional tasks OT Short Term Goal 3 - Progress: Met OT Short Term Goal 4: Pt will initate with mod cuing with a grooming tasks at the sink OT Short Term Goal 4 - Progress: Met OT Short Term Goal 5: Pt will attend to left side with mod cuing during functional task  Skilled Therapeutic Interventions/Progress Updates:  ADL retraining in room. Pt ambulated with RW to toilet/shower SteadyA. Showered using tub bench. Ambulated to EOB for dressing. Required mod verbal/tactile cues for initiation and extra time to respond to cues given (responds well to counting before sit/stand). Pt c/o legs hurting x 3 (RN made aware). Focus on activity tolerance, initiation of tasks, and functional ambulation.    Pain Pain Assessment Pain Assessment: 0-10 Pain Score:   9 Pain Type: Acute pain Pain Location: Leg Pain Orientation: Right;Left  Therapy/Group: Individual Therapy  Loleta Chance, OTAS 07/16/2011, 10:13 AM

## 2011-07-16 NOTE — Progress Notes (Signed)
Speech Language Pathology Daily Session Note  Patient Details  Name: Jacqueline Christian MRN: [id number] Date of Birth: [birth year]  Today's Date: 07/16/2011 Time: 0800-0900 Time Calculation (min): 60 min  Short Term Goals: Week 2: SLP Short Term Goal 1 (Week 2): Pt will sustain attention to a functional task (grooming, self-feeding) for 1 minute with Max verbal cues SLP Short Term Goal 2 (Week 2): Pt will initiate with Mod verbal and visual cues during a functional task. SLP Short Term Goal 3 (Week 2): Pt will demonstrate functional problem solving with basic and familiar tasks with Mod A verbal and visual cues.  SLP Short Term Goal 4 (Week 2): Pt will follow 1 step commands with Mod verbal and gestural cues.   Skilled Therapeutic Interventions:Treatment focus on sustained attention and initiation with functional tasks. Pt's overall verbal initiation is appropriate but requires Max-Total verbal, visual, tactile and eventual [place] over [place] assist for initiation of bites for self-feeding but independent for initiation of liquids (difficulty determining if lack of intake is due to decreased initiation or decreased appetite or both). Demonstrated increase intellectual awareness of situation and deficits and increased emergent awareness during functional tasks by reporting, "my brain just wants to go to [place]."    Daily Session FIM:  Comprehension Comprehension: 4-Understands basic 75 - 89% of the time/requires cueing 10 - 24% of the time Expression Expression: 3-Expresses basic 50 - 74% of the time/requires cueing 25 - 50% of the time. Needs to repeat parts of sentences. Social Interaction Social Interaction: 2-Interacts appropriately 25 - 49% of time - Needs frequent redirection. Problem Solving Problem Solving: 2-Solves basic 25 - 49% of the time - needs direction more than half the time to initiate, plan or complete simple activities Memory Memory: 3-Recognizes or recalls 50 - 74% of the  time/requires cueing 25 - 49% of the time  Pain Headache: 9/10; RN aware and medications given   Therapy/Group: Individual Therapy  Miangel Flom 07/16/2011, 12:16 PM

## 2011-07-17 DIAGNOSIS — Z5189 Encounter for other specified aftercare: Secondary | ICD-10-CM

## 2011-07-17 DIAGNOSIS — C700 Malignant neoplasm of cerebral meninges: Secondary | ICD-10-CM

## 2011-07-17 DIAGNOSIS — I619 Nontraumatic intracerebral hemorrhage, unspecified: Secondary | ICD-10-CM

## 2011-07-17 MED ORDER — MUSCLE RUB 10-15 % EX CREA
TOPICAL_CREAM | Freq: Two times a day (BID) | CUTANEOUS | Status: DC
  Administered 2011-07-17 – 2011-07-20 (×7): via TOPICAL
  Administered 2011-07-20: 1 via TOPICAL
  Administered 2011-07-21 – 2011-07-28 (×15): via TOPICAL
  Filled 2011-07-17 (×2): qty 85

## 2011-07-17 NOTE — Progress Notes (Signed)
Occupational Therapy Session Note  Patient Details  Name: Jacqueline Christian MRN: [id number] Date of Birth: 25-Jan-1946  Today's Date: 07/17/2011 Time: 1330-1403 Time Calculation (min): 33 min  Skilled Therapeutic Interventions/Progress Updates: Individual session. Patient denies pain but stated she thinks she has a UTI.  Agreed to work with OT to get dressed, do grooming and sit up in recliner chair.  Skilled treatment focused on sequencing, organization, sustained attention, sit to stand, standing balance, safety, use of rolling walker, standing tolerance, increasing activity tolerance.  Patient continues to describe seeing snakes in her room.  Patient's sister was present and observed session.  Sister would like to learn how to transfer and ambulate patient with walker next week - instructed her to follow up directly with patient's primary therapists.  Sister and patient agreeable.     General   Pain  Denies pain ADL:  See FIM  Exercises    Other Treatments   Norton Pastel 07/17/2011, 2:08 PM

## 2011-07-17 NOTE — Progress Notes (Signed)
Patient ID: Jacqueline Christian, female   DOB: 1945/12/12, 66 y.o.   MRN: [id number] Subjective/Complaints: Review of Systems  Constitutional: Positive for malaise/fatigue.  All other systems reviewed and are negative.    Objective: Vital Signs: Blood pressure 130/76, pulse 92, temperature 97.8 F (36.6 C), temperature source Oral, resp. rate 18, height 5\' 3"  (1.6 m), weight 77.565 kg (171 lb), SpO2 96.00%. No results found. No results found for this basename: WBC:2,HGB:2,HCT:2,PLT:2 in the last 72 hours No results found for this basename: NA:2,K:2,CL:2,CO2:2,GLUCOSE:2,BUN:2,CREATININE:2,CALCIUM:2 in the last 72 hours CBG (last 3)  No results found for this basename: GLUCAP:3 in the last 72 hours  Wt Readings from Last 3 Encounters:  07/07/11 77.565 kg (171 lb)  07/07/11 77.7 kg (171 lb 4.8 oz)  07/07/11 77.7 kg (171 lb 4.8 oz)   BP Readings from Last 3 Encounters:  07/17/11 130/76  07/07/11 120/48  07/07/11 120/48   Physical Exam:  General appearance: distracted, no distress and slowed mentation Head: Normocephalic, without obvious abnormality, atraumatic Eyes: conjunctivae/corneas clear. PERRL, EOM's intact. Fundi benign. Ears: normal TM's and external ear canals both ears Nose: Nares normal. Septum midline. Mucosa normal. No drainage or sinus tenderness. Throat: lips, mucosa, and tongue normal; teeth and gums normal. Trach site closed.. Neck: no adenopathy, no carotid bruit, no JVD, supple, symmetrical, trachea midline and thyroid not enlarged, symmetric, no tenderness/mass/nodules Back: symmetric, no curvature. ROM normal. No CVA tenderness. Resp: clear to auscultation bilaterally Cardio: heart regular without murmur. GI: soft, non-tender; bowel sounds normal; no masses,  no organomegaly Extremities: extremities normal, atraumatic, no cyanosis or edema Pulses: 2+ and symmetric Skin: Skin color, texture, turgor normal. No rashes or lesions Neurologic:doesn't appear to have  field cut as she attended to stimuli on either side. Alert but flat. Answers biographical questions with a little delay. Moves all 4's. Processing delays  Knows she's in the hospital. Incision/Wound: wound on scalp clean and intact Musc: legs non edematous and really minimally tender to touch. Exam 2/8  Assessment/Plan: 1. Functional deficits secondary to meningioma with post op ICH which require 3+ hours per day of interdisciplinary therapy in a comprehensive inpatient rehab setting. Physiatrist is providing close team supervision and 24 hour management of active medical problems listed below. Physiatrist and rehab team continue to assess barriers to discharge/monitor patient progress toward functional and medical goals.   FIM: FIM - Bathing Bathing Steps Patient Completed: Chest;Right Arm;Left Arm;Abdomen;Front perineal area;Buttocks;Right upper leg;Left upper leg;Right lower leg (including foot);Left lower leg (including foot) Bathing: 4: Steadying assist  FIM - Upper Body Dressing/Undressing Upper body dressing/undressing steps patient completed: Thread/unthread right bra strap;Thread/unthread left bra strap;Thread/unthread right sleeve of pullover shirt/dresss;Thread/unthread left sleeve of pullover shirt/dress;Put head through opening of pull over shirt/dress;Pull shirt over trunk Upper body dressing/undressing: 4: Min-Patient completed 75 plus % of tasks FIM - Lower Body Dressing/Undressing Lower body dressing/undressing steps patient completed: Pull underwear up/down;Thread/unthread right pants leg;Thread/unthread left pants leg;Pull pants up/down;Don/Doff right sock;Don/Doff left sock;Don/Doff right shoe;Don/Doff left shoe Lower body dressing/undressing: 4: Min-Patient completed 75 plus % of tasks  FIM - Toileting Toileting steps completed by patient: Adjust clothing prior to toileting;Performs perineal hygiene;Adjust clothing after toileting Toileting Assistive Devices: Grab bar or  rail for support Toileting: 4: Steadying assist  FIM - Diplomatic Services operational officer Devices: Art gallery manager Transfers: 4-To toilet/BSC: Min A (steadying Pt. > 75%)  FIM - Bed/Chair Transfer Bed/Chair Transfer Assistive Devices: Therapist, occupational: 5: Sit > Supine: Supervision (verbal cues/safety issues)  FIM -  Locomotion: Wheelchair Locomotion: Wheelchair: 0: Activity did not occur FIM - Locomotion: Ambulation Locomotion: Ambulation Assistive Devices: Designer, industrial/product Ambulation/Gait Assistance: 4: Min assist Locomotion: Ambulation: 2: Travels 50 - 149 ft with minimal assistance (Pt.>75%)  Comprehension Comprehension Mode: Auditory Comprehension: 4-Understands basic 75 - 89% of the time/requires cueing 10 - 24% of the time  Expression Expression Mode: Verbal Expression: 3-Expresses basic 50 - 74% of the time/requires cueing 25 - 50% of the time. Needs to repeat parts of sentences.  Social Interaction Social Interaction: 4-Interacts appropriately 75 - 89% of the time - Needs redirection for appropriate language or to initiate interaction.  Problem Solving Problem Solving: 2-Solves basic 25 - 49% of the time - needs direction more than half the time to initiate, plan or complete simple activities  Memory Memory: 2-Recognizes or recalls 25 - 49% of the time/requires cueing 51 - 75% of the time   1. DVT Prophylaxis/Anticoagulation: Mechanical: Sequential compression devices, below knee Bilateral lower extremities. BLE doppler negative.  2. Pain Management: added tylenol scheduled for leg symptoms, likely muscular. Exam inconsistent.Kpad and sports creme ordered 3. Mood: Currently with flat affect and poor awareness. Ego support by team  -ritalin trial to boost attention and arousal. Increased to 10mg  with positive results. 4. Sphenoid wing meningioma with vision loss- left crani for debulking/resection of tumor.  5. Resection of tumor with right frontal ICH-  on keppra for seizure prophylaxis. -Normalize sleep cycle.  6. Respiratory failure requiring trach: trach out without issues. 7. Staph UTI: abx completed still with lower abd discomfort will reculture 8. ABLA: Add iron supplement. Recheck hgb stable. 9. HTN: Off tenormin currenty as blood pressures occasionally low. Monitor with bid checks and resume if blood pressures remain elevated. 10. FEN-eating well.  11. Hallucinations likely are a combination of cognitive impairments as well as visual deficits. She has awareness to some extent that they aren't reality. She is non-agitated and isn't psychotic.  Should gradually improve.  LOS (Days) 10 A FACE TO FACE EVALUATION WAS PERFORMED  Leahann Lempke E 07/17/2011, 8:25 AM

## 2011-07-18 NOTE — Progress Notes (Signed)
Physical Therapy Session Note  Patient Details  Name: Jacqueline Christian MRN: [id number] Date of Birth: 06-Jun-1946  Today's Date: 07/18/2011 Time: 0800-0839 Time Calculation (min): 39 min  Precautions: Precautions Precautions: Fall Precaution Comments: decreased safety awareness, quick relaease belt Required Braces or Orthoses: No Restrictions Weight Bearing Restrictions: No  Short Term Goals: PT Short Term Goal 1: pt will ambulate 56' with min assist PT Short Term Goal 1 - Progress: Met PT Short Term Goal 2: pt will ambulate 4 steps with min assist PT Short Term Goal 2 - Progress: Met PT Short Term Goal 3: pt will demonstrate sustained attention for 1 minute with max vc PT Short Term Goal 3 - Progress: Met  Skilled Therapeutic Interventions/Progress Updates:    cognitive rehab for awareness, attention, problem solving- pt with difficulty following commands for BErg and finishing each d/t decreased sustained attention to mobility task; she verbalizes deficits (balance and memory) with some prompting but doesn't realize them in the moment;she is unable to answer questions while walking;  gait training and therapeutic activity: balance testing; pt hallucinating seeing things on walls, MD and RN informed - MD states this is normal for her   General Chart Reviewed: Yes Family/Caregiver Present: No Vitals hr 96 O2 98  Reports heat and elevation helps leg pain, denied pain at rest, c/o leg pain once activity started; Kpad applied and legs elevated in recliner at end of session Pain Pain Assessment Pain Assessment: Faces Faces Pain Scale: Hurts even more Pain Type:  (new since surgery) Pain Location: Leg Pain Orientation: Right;Left;Distal Pain Descriptors: Throbbing Pain Intervention(s): RN made aware Multiple Pain Sites: No Mobility S bed mobiluty and transfers with UE assist   Locomotion  Ambulation Ambulation/Gait Assistance: 4: Min assist Assistive device: Rolling  walker Ambulation/Gait Assistance Details (indicate cue type and reason): decreased safety awareness, let's go to scratch for example but keeps walking; unable to answer questions and walk (poor divided attention)      Balance Balance Balance Assessed: Yes Standardized Balance Assessment Standardized Balance Assessment: Berg Balance Test Berg Balance Test Sit to Stand: Able to stand  independently using hands Standing Unsupported: Able to stand 2 minutes with supervision Sitting with Back Unsupported but Feet Supported on Floor or Stool: Able to sit safely and securely 2 minutes Stand to Sit: Controls descent by using hands Transfers: Able to transfer with verbal cueing and /or supervision Standing Unsupported with Eyes Closed: Unable to keep eyes closed 3 seconds but stays steady Standing Ubsupported with Feet Together: Able to place feet together independently but unable to hold for 30 seconds From Standing, Reach Forward with Outstretched Arm: Reaches forward but needs supervision From Standing Position, Pick up Object from Floor: Unable to try/needs assist to keep balance (assist to come back up) From Standing Position, Turn to Look Behind Over each Shoulder: Needs supervision when turning Turn 360 Degrees: Needs assistance while turning Standing Unsupported, Alternately Place Feet on Step/Stool: Needs assistance to keep from falling or unable to try Standing Unsupported, One Foot in Front: Needs help to step but can hold 15 seconds Standing on One Leg: Unable to try or needs assist to prevent fall Total Score: 21   Patient demonstrates increased fall risk as noted by score of  21 /56 on Berg Balance Scale.  (<36= high risk for falls, close to 100%; 37-45 significant >80%; 46-51 moderate >50%; 52-55 lower >25%)    Other Treatments  pt needed cues to determine R vs left when putting on  shoes; was able to locate room from gym with min A  Therapy/Group: Individual Therapy  Michaelene Song 07/18/2011, 8:40 AM

## 2011-07-18 NOTE — Progress Notes (Signed)
Patient ID: Jacqueline Christian, female   DOB: 07-11-1945, 66 y.o.   MRN: [id number] Subjective/Complaints: Review of Systems  Constitutional: Positive for malaise/fatigue.  All other systems reviewed and are negative.    Objective: Vital Signs: Blood pressure 131/76, pulse 96, temperature 98.2 F (36.8 C), temperature source Oral, resp. rate 16, height 5\' 3"  (1.6 m), weight 77.565 kg (171 lb), SpO2 95.00%. No results found. No results found for this basename: WBC:2,HGB:2,HCT:2,PLT:2 in the last 72 hours No results found for this basename: NA:2,K:2,CL:2,CO2:2,GLUCOSE:2,BUN:2,CREATININE:2,CALCIUM:2 in the last 72 hours CBG (last 3)  No results found for this basename: GLUCAP:3 in the last 72 hours  Wt Readings from Last 3 Encounters:  07/07/11 77.565 kg (171 lb)  07/07/11 77.7 kg (171 lb 4.8 oz)  07/07/11 77.7 kg (171 lb 4.8 oz)   BP Readings from Last 3 Encounters:  07/18/11 131/76  07/07/11 120/48  07/07/11 120/48   Physical Exam:  General appearance: distracted, no distress and slowed mentation Head: Normocephalic, without obvious abnormality, atraumatic Eyes: conjunctivae/corneas clear. PERRL, EOM's intact. Fundi benign. Ears: normal TM's and external ear canals both ears Nose: Nares normal. Septum midline. Mucosa normal. No drainage or sinus tenderness. Throat: lips, mucosa, and tongue normal; teeth and gums normal. Trach site closed.. Neck: no adenopathy, no carotid bruit, no JVD, supple, symmetrical, trachea midline and thyroid not enlarged, symmetric, no tenderness/mass/nodules Back: symmetric, no curvature. ROM normal. No CVA tenderness. Resp: clear to auscultation bilaterally Cardio: heart regular without murmur. GI: soft, non-tender; bowel sounds normal; no masses,  no organomegaly Extremities: extremities normal, atraumatic, no cyanosis or edema Pulses: 2+ and symmetric Skin: Skin color, texture, turgor normal. No rashes or lesions Neurologic:doesn't appear to have  field cut as she attended to stimuli on either side. Alert but flat. Answers biographical questions with a little delay. Moves all 4's. Processing delays  Knows she's in the hospital. Incision/Wound: wound on scalp clean and intact Musc: legs non edematous and really minimally tender to touch. Exam 2/8  Assessment/Plan: 1. Functional deficits secondary to meningioma with post op ICH which require 3+ hours per day of interdisciplinary therapy in a comprehensive inpatient rehab setting. Physiatrist is providing close team supervision and 24 hour management of active medical problems listed below. Physiatrist and rehab team continue to assess barriers to discharge/monitor patient progress toward functional and medical goals.   FIM: FIM - Bathing Bathing Steps Patient Completed: Chest;Right Arm;Left Arm;Abdomen;Front perineal area;Buttocks;Right upper leg;Left upper leg;Right lower leg (including foot);Left lower leg (including foot) Bathing: 0: Activity did not occur  FIM - Upper Body Dressing/Undressing Upper body dressing/undressing steps patient completed: Thread/unthread right bra strap;Thread/unthread left bra strap;Thread/unthread right sleeve of pullover shirt/dresss;Thread/unthread left sleeve of pullover shirt/dress;Put head through opening of pull over shirt/dress;Pull shirt over trunk Upper body dressing/undressing: 4: Min-Patient completed 75 plus % of tasks FIM - Lower Body Dressing/Undressing Lower body dressing/undressing steps patient completed: Thread/unthread right underwear leg;Thread/unthread left underwear leg;Pull underwear up/down;Thread/unthread right pants leg;Thread/unthread left pants leg;Pull pants up/down;Don/Doff right shoe;Don/Doff left shoe Lower body dressing/undressing: 5: Supervision: Safety issues/verbal cues  FIM - Toileting Toileting steps completed by patient: Adjust clothing prior to toileting;Performs perineal hygiene;Adjust clothing after  toileting Toileting Assistive Devices: Grab bar or rail for support Toileting: 0: Activity did not occur  FIM - Diplomatic Services operational officer Devices: Art gallery manager Transfers: 0-Activity did not occur  FIM - Banker Devices: Therapist, occupational: 4: Supine > Sit: Min A (steadying Pt. > 75%/lift 1  leg);5: Bed > Chair or W/C: Supervision (verbal cues/safety issues)  FIM - Locomotion: Wheelchair Locomotion: Wheelchair: 0: Activity did not occur FIM - Locomotion: Ambulation Locomotion: Ambulation Assistive Devices: Designer, industrial/product Ambulation/Gait Assistance: 4: Min assist Locomotion: Ambulation: 2: Travels 50 - 149 ft with minimal assistance (Pt.>75%)  Comprehension Comprehension Mode: Auditory Comprehension: 4-Understands basic 75 - 89% of the time/requires cueing 10 - 24% of the time  Expression Expression Mode: Verbal Expression: 5-Expresses basic needs/ideas: With extra time/assistive device (pt continues to see hallucinations (snakes))  Social Interaction Social Interaction: 5-Interacts appropriately 90% of the time - Needs monitoring or encouragement for participation or interaction.  Problem Solving Problem Solving: 5-Solves basic 90% of the time/requires cueing < 10% of the time  Memory Memory: 4-Recognizes or recalls 75 - 89% of the time/requires cueing 10 - 24% of the time   1. DVT Prophylaxis/Anticoagulation: Mechanical: Sequential compression devices, below knee Bilateral lower extremities. BLE doppler negative.  2. Pain Management: added tylenol scheduled for leg symptoms, likely muscular. Exam inconsistent.Kpad and sports creme ordered with positive result      3. Mood: Currently with flat affect and poor awareness. Ego support by team  -ritalin trial to boost attention and arousal. Increased to 10mg  with positive results. 4. Sphenoid wing meningioma with vision loss- left crani for debulking/resection of  tumor.  5. Resection of tumor with right frontal ICH- on keppra for seizure prophylaxis. -Normalize sleep cycle.  6. Respiratory failure requiring trach: trach out without issues. 7. Staph UTI: abx completed still with lower abd discomfort will reculture 8. ABLA: Add iron supplement. Recheck hgb stable. 9. HTN: Off tenormin currenty as blood pressures occasionally low. Monitor with bid checks and resume if blood pressures remain elevated. 10. FEN-eating well.  11. Hallucinations likely are a combination of cognitive impairments as well as visual deficits. She has awareness to some extent that they aren't reality. She is non-agitated and isn't psychotic.  Should gradually improve.  LOS (Days) 11 A FACE TO FACE EVALUATION WAS PERFORMED  Gaynor Genco E 07/18/2011, 7:52 AM

## 2011-07-19 MED ORDER — METHOCARBAMOL 500 MG PO TABS
500.0000 mg | ORAL_TABLET | Freq: Four times a day (QID) | ORAL | Status: DC | PRN
  Administered 2011-07-19 – 2011-07-22 (×2): 500 mg via ORAL
  Filled 2011-07-19 (×2): qty 1

## 2011-07-19 NOTE — Progress Notes (Signed)
Speech Language Pathology Daily Session Note  Patient Details  Name: Jacqueline Christian MRN: [id number] Date of Birth: 12/02/1945  Today's Date: 07/19/2011 Time: 0800-0900 Time Calculation (min): 60 min  Short Term Goals:  SLP Short Term Goal 1 (Week 2): Pt will sustain attention to a functional task (grooming, self-feeding) for 1 minute with Max verbal cues SLP Short Term Goal 2 (Week 2): Pt will initiate with Mod verbal and visual cues during a functional task. SLP Short Term Goal 3 (Week 2): Pt will demonstrate functional problem solving with basic and familiar tasks with Mod A verbal and visual cues.  SLP Short Term Goal 4 (Week 2): Pt will follow 1 step commands with Mod verbal and gestural cues.   Skilled Therapeutic Interventions: Treatment focus on initiation and sustained attention to task. Pt demonstrates moderate confusion and perseveration this morning in regards to the "active shooter drill" and pt needed Max verbal cues for redirection to self-feeding task. Pt initiated meal today with Min verbal and visual cues and demonstrated divided attention between meal and conversation for ~1 minute. Moderate verbal and visual cues needed to utilize schedule to anticipate therapy and recall morning events.    Daily Session FIM:  Comprehension Comprehension Mode: Auditory Comprehension: 5-Follows basic conversation/direction: With extra time/assistive device Expression Expression Mode: Verbal Expression: 4-Expresses basic 75 - 89% of the time/requires cueing 10 - 24% of the time. Needs helper to occlude trach/needs to repeat words. Social Interaction Social Interaction: 5-Interacts appropriately 90% of the time - Needs monitoring or encouragement for participation or interaction. Problem Solving Problem Solving: 3-Solves basic 50 - 74% of the time/requires cueing 25 - 49% of the time Memory Memory: 3-Recognizes or recalls 50 - 74% of the time/requires cueing 25 - 49% of the time FIM -  Eating Eating Activity: 5: Supervision/cues Pain 9/10 with headache: Pt pre-medicated   Therapy/Group: Individual Therapy  Treonna Klee 07/19/2011, 11:50 AM

## 2011-07-19 NOTE — Progress Notes (Signed)
Physical Therapy Note  Patient Details  Name: Jacqueline Christian MRN: [id number] Date of Birth: 1946-03-26 Today's Date: 07/19/2011  13:00- 13:30 individual therapy pt complained of pain in legs unrated, nurse was made aware/.  Gait training 2 x 200' with RW minguard assist. Addressed pt's husband's questions about showering with OT.   [name] 07/19/2011, 4:14 PM

## 2011-07-19 NOTE — Progress Notes (Signed)
Physical Therapy Note  Patient Details  Name: Jacqueline Christian MRN: [id number] Date of Birth: 1946/03/22 Today's Date: 07/19/2011  9:20- 10:00 individual therapy pt complained of pain in legs and at scar on head. Nurse brought pain medication.  Session focused on gait without walker and dynamic balance including walking around cones, stepping over objects, curb without UE support, walking while bouncing ball all min assist with 1 self corrected LOB. Pt required mod direct cues for divided attention with mobility and balance and max cues for recall of activity.  Gait training with [phone]' min assist controlled halllway   [name] 07/19/2011, 9:53 AM

## 2011-07-19 NOTE — Progress Notes (Signed)
Note reviewed and accurately reflects treatment session.   

## 2011-07-19 NOTE — Progress Notes (Signed)
Patient ID: Jacqueline Christian, female   DOB: 10-07-1945, 66 y.o.   MRN: [id number] Patient ID: Jacqueline Christian, female   DOB: 09-26-1945, 66 y.o.   MRN: [id number] Subjective/Complaints: Review of Systems  Constitutional: Positive for malaise/fatigue.  All other systems reviewed and are negative.  2/11- in good spirits.  Was confused by "shooter" drill last night  Objective: Vital Signs: Blood pressure 137/70, pulse 86, temperature 97.6 F (36.4 C), temperature source Oral, resp. rate 18, height 5\' 3"  (1.6 m), weight 77.565 kg (171 lb), SpO2 97.00%. No results found. No results found for this basename: WBC:2,HGB:2,HCT:2,PLT:2 in the last 72 hours No results found for this basename: NA:2,K:2,CL:2,CO2:2,GLUCOSE:2,BUN:2,CREATININE:2,CALCIUM:2 in the last 72 hours CBG (last 3)  No results found for this basename: GLUCAP:3 in the last 72 hours  Wt Readings from Last 3 Encounters:  07/07/11 77.565 kg (171 lb)  07/07/11 77.7 kg (171 lb 4.8 oz)  07/07/11 77.7 kg (171 lb 4.8 oz)   BP Readings from Last 3 Encounters:  07/19/11 137/70  07/07/11 120/48  07/07/11 120/48   Physical Exam:  General appearance: distracted, no distress and slowed mentation Head: Normocephalic, without obvious abnormality, atraumatic Eyes: conjunctivae/corneas clear. PERRL, EOM's intact. Fundi benign. Ears: normal TM's and external ear canals both ears Nose: Nares normal. Septum midline. Mucosa normal. No drainage or sinus tenderness. Throat: lips, mucosa, and tongue normal; teeth and gums normal. Trach site closed.. Neck: no adenopathy, no carotid bruit, no JVD, supple, symmetrical, trachea midline and thyroid not enlarged, symmetric, no tenderness/mass/nodules Back: symmetric, no curvature. ROM normal. No CVA tenderness. Resp: clear to auscultation bilaterally Cardio: heart regular without murmur. GI: soft, non-tender; bowel sounds normal; no masses,  no organomegaly Extremities: extremities normal, atraumatic, no  cyanosis or edema Pulses: 2+ and symmetric Skin: Skin color, texture, turgor normal. No rashes or lesions Neurologic:very alert.  Still a little delusional about this weekend's drill. Knows she's in the hospital. Initiating nicely with breakfast Incision/Wound: wound on scalp clean and intact Musc: legs non edematous and really minimally tender to touch. Exam 2/11  Assessment/Plan: 1. Functional deficits secondary to meningioma with post op ICH which require 3+ hours per day of interdisciplinary therapy in a comprehensive inpatient.  rehab setting. Physiatrist is providing close team supervision and 24 hour management of active medical problems listed below. Physiatrist and rehab team continue to assess barriers to discharge/monitor patient progress toward functional and medical goals.   FIM: FIM - Bathing Bathing Steps Patient Completed: Chest;Right Arm;Left Arm;Abdomen;Front perineal area;Buttocks;Right upper leg;Left upper leg;Right lower leg (including foot);Left lower leg (including foot) Bathing: 0: Activity did not occur  FIM - Upper Body Dressing/Undressing Upper body dressing/undressing steps patient completed: Thread/unthread right bra strap;Thread/unthread left bra strap;Thread/unthread right sleeve of pullover shirt/dresss;Thread/unthread left sleeve of pullover shirt/dress;Put head through opening of pull over shirt/dress;Pull shirt over trunk Upper body dressing/undressing: 4: Min-Patient completed 75 plus % of tasks FIM - Lower Body Dressing/Undressing Lower body dressing/undressing steps patient completed: Thread/unthread right underwear leg;Thread/unthread left underwear leg;Pull underwear up/down;Thread/unthread right pants leg;Thread/unthread left pants leg;Pull pants up/down;Don/Doff right shoe;Don/Doff left shoe Lower body dressing/undressing: 5: Supervision: Safety issues/verbal cues  FIM - Toileting Toileting steps completed by patient: Adjust clothing prior to  toileting;Performs perineal hygiene;Adjust clothing after toileting Toileting Assistive Devices: Grab bar or rail for support Toileting: 0: Activity did not occur  FIM - Diplomatic Services operational officer Devices: Art gallery manager Transfers: 0-Activity did not occur  FIM - Banker Devices: Environmental consultant  Bed/Chair Transfer: 5: Supine > Sit: Supervision (verbal cues/safety issues);5: Bed > Chair or W/C: Supervision (verbal cues/safety issues)  FIM - Locomotion: Wheelchair Locomotion: Wheelchair: 0: Activity did not occur FIM - Locomotion: Ambulation Locomotion: Ambulation Assistive Devices: Designer, industrial/product Ambulation/Gait Assistance: 4: Min assist Locomotion: Ambulation: 4: Travels 150 ft or more with minimal assistance (Pt.>75%)  Comprehension Comprehension Mode: Auditory Comprehension: 4-Understands basic 75 - 89% of the time/requires cueing 10 - 24% of the time  Expression Expression Mode: Verbal Expression: 5-Expresses basic needs/ideas: With no assist  Social Interaction Social Interaction: 5-Interacts appropriately 90% of the time - Needs monitoring or encouragement for participation or interaction.  Problem Solving Problem Solving: 4-Solves basic 75 - 89% of the time/requires cueing 10 - 24% of the time  Memory Memory: 4-Recognizes or recalls 75 - 89% of the time/requires cueing 10 - 24% of the time   1. DVT Prophylaxis/Anticoagulation: Mechanical: Sequential compression devices, below knee Bilateral lower extremities. BLE doppler negative.  2. Pain Management: added tylenol scheduled for leg symptoms, likely muscular. Exam inconsistent.Kpad and sports creme ordered with positive result. 3.Mood: affect improving. Ego support by team  -ritalin trial to boost attention and arousal. Increased to 10mg  with positive results. 4. Sphenoid wing meningioma with vision loss- left crani for debulking/resection of tumor.  5. Resection of tumor  with right frontal ICH- on keppra for seizure prophylaxis. -Normalize sleep cycle.  6. Respiratory failure requiring trach: trach out without issues. 7. Staph UTI: abx completed still with lower abd discomfort will reculture 8. ABLA: Add iron supplement. Recheck hgb stable. 9. HTN: Off tenormin currenty as blood pressures occasionally low. Monitor with bid checks and resume if blood pressures remain elevated. 10. FEN-eating well.  11. Hallucinations and delusions likely are a combination of cognitive impairments as well as visual deficits. She has awareness to some extent that they aren't reality. She is non-agitated and isn't psychotic.  These are gradually improving. Needs regular "reality" reinforcment  LOS (Days) 12 A FACE TO FACE EVALUATION WAS PERFORMED  SWARTZ,ZACHARY T 07/19/2011, 8:15 AM

## 2011-07-19 NOTE — Progress Notes (Signed)
Occupational Therapy Session Note  Patient Details  Name: Jacqueline Christian MRN: [id number] Date of Birth: [birth year]  Today's Date: 07/19/2011 Time: 0700-0800 Time Calculation (min): 60 min  Precautions: Precautions Precautions: Fall Precaution Comments: decreased safety awareness, quick relaease belt Required Braces or Orthoses: No Restrictions Weight Bearing Restrictions: No  Short Term Goals: OT Short Term Goal 1: Pt will don shirt with min A with cuing  OT Short Term Goal 1 - Progress: Met OT Short Term Goal 2: Pt will perform sit to stand in an automatic situation (ie toileting)  with min A OT Short Term Goal 2 - Progress: Met OT Short Term Goal 3: Pt will maintain standing with min A with at least one UE support during functional tasks OT Short Term Goal 3 - Progress: Met OT Short Term Goal 4: Pt will initate with mod cuing with a grooming tasks at the sink OT Short Term Goal 4 - Progress: Met OT Short Term Goal 5: Pt will attend to left side with mod cuing during functional task  Skilled Therapeutic Interventions/Progress Updates:   ADL retraining in room. Pt ambulated with RW to toilet/shower SteadyA. Showered using tub bench. Ambulated to EOB for dressing. Required mod verbal/tactile cues for initiation of tasks. Pt responds well to counting and tactile cues (on lower back) for sit to stand. Pt perseverated on drill that was completed at hospital this weekend. Focus on activity tolerance, initiation of tasks, and functional ambulation.    Pain None stated   Therapy/Group: Individual Therapy  Loleta Chance, OTAS 07/19/2011, 12:06 PM

## 2011-07-20 NOTE — Progress Notes (Signed)
Physical Therapy Note  Patient Details  Name: Jacqueline Christian MRN: [id number] Date of Birth: 1945/06/28 Today's Date: 07/20/2011  13:00- 13:30 individual therapy pt did not complain of pain.  Session spent discussing cognition and awareness with pt's husband. Pt has been very tearfull today stating she was hallucinating at night and she is demonstrating more awareness of her deficits. This was discussed with husbandand he plans to spend the night to see if that eased pt's fears at night.   [name] 07/20/2011, 2:04 PM

## 2011-07-20 NOTE — Progress Notes (Signed)
Occupational Therapy Session Note  Patient Details  Name: Jacqueline Christian MRN: [id number] Date of Birth: Oct 11, 1945  Today's Date: 07/20/2011 Time: 0700-0800 Time Calculation (min): 60 min  Short Term Goals:   Skilled Therapeutic Interventions/Progress Updates:  ADL retraining in room. Pt supervision for sup/sit and then ambulated with RW to gather clothes. Pt ambulated with RW to toilet and then to shower SteadyA. Pt was supervision for toileting. Showered using tub bench, bathing 10/10 parts. Pt perseverated on rinsing LE's while in shower. Ambulated to EOB for dressing. Continuously requiring mod/max verbal cues for sustained attention and initiation. Pt reported that her legs were feeling better today. Pt also reported seeing a snake in her plate of chocolate cake yesterday. Focus on activity tolerance, initiation of tasks, functional ambulation, and sequencing.   Pain: None stated  See FIM for current functional status  Therapy/Group: Individual Therapy  Loleta Chance, OTAS 07/20/2011, 11:02 AM

## 2011-07-20 NOTE — Progress Notes (Signed)
Note reviewed and accurately reflects treatment session.   

## 2011-07-20 NOTE — Progress Notes (Signed)
Patient Details  Name: CELESTIAL BARNFIELD MRN: [id number] Date of Birth: Sep 09, 1945  Today's Date: 07/20/2011 Time:9-930 Skilled Therapeutic Interventions/Progress Updates: Discussed leisure interesst with pt while seated.  Pt questioning what she will do when she goes home... "I like to stay on the go and I can't"  Discussed option of remaining active and participating in her interests but not alone, but as a partner.  Pt agreeable, but tearful.  Ambulated with RW from room to gym with close supervision-contact guard assist.  No c/o pain.  Therapy/Group: Individual Therapy  Activity Level: Simple:  Level of assist: Min Assist  Brittin Belnap 07/20/2011, 5:07 PM

## 2011-07-20 NOTE — Progress Notes (Signed)
Subjective/Complaints: Review of Systems  Constitutional: Positive for malaise/fatigue.  All other systems reviewed and are negative.  2/12---alert this am.  Still having some hallucinations but is non-agitated.  Objective: Vital Signs: Blood pressure 97/53, pulse 74, temperature 98.7 F (37.1 C), temperature source Oral, resp. rate 18, height 5\' 3"  (1.6 m), weight 77.565 kg (171 lb), SpO2 96.00%. No results found. No results found for this basename: WBC:2,HGB:2,HCT:2,PLT:2 in the last 72 hours No results found for this basename: NA:2,K:2,CL:2,CO2:2,GLUCOSE:2,BUN:2,CREATININE:2,CALCIUM:2 in the last 72 hours CBG (last 3)  No results found for this basename: GLUCAP:3 in the last 72 hours  Wt Readings from Last 3 Encounters:  07/07/11 77.565 kg (171 lb)  07/07/11 77.7 kg (171 lb 4.8 oz)  07/07/11 77.7 kg (171 lb 4.8 oz)   BP Readings from Last 3 Encounters:  07/20/11 97/53  07/07/11 120/48  07/07/11 120/48   Physical Exam:  General appearance: distracted, no distress and slowed mentation Head: Normocephalic, without obvious abnormality, atraumatic Eyes: conjunctivae/corneas clear. PERRL, EOM's intact. Fundi benign. Ears: normal TM's and external ear canals both ears Nose: Nares normal. Septum midline. Mucosa normal. No drainage or sinus tenderness. Throat: lips, mucosa, and tongue normal; teeth and gums normal. Trach site closed.. Neck: no adenopathy, no carotid bruit, no JVD, supple, symmetrical, trachea midline and thyroid not enlarged, symmetric, no tenderness/mass/nodules Back: symmetric, no curvature. ROM normal. No CVA tenderness. Resp: clear to auscultation bilaterally Cardio: heart regular without murmur. GI: soft, non-tender; bowel sounds normal; no masses,  no organomegaly Extremities: extremities normal, atraumatic, no cyanosis or edema Pulses: 2+ and symmetric Skin: Skin color, texture, turgor normal. No rashes or lesions Neurologic:very alert.  Still a little  delusional about this weekend's drill. Knows she's in the hospital. Initiating nicely with breakfast Incision/Wound: wound on scalp clean and intact Musc: legs non edematous and really minimally tender to touch. Exam 2/12--  Assessment/Plan: 1. Functional deficits secondary to meningioma with post op ICH which require 3+ hours per day of interdisciplinary therapy in a comprehensive inpatient.  rehab setting. Physiatrist is providing close team supervision and 24 hour management of active medical problems listed below. Physiatrist and rehab team continue to assess barriers to discharge/monitor patient progress toward functional and medical goals.   FIM: FIM - Bathing Bathing Steps Patient Completed: Chest;Right Arm;Left Arm;Abdomen;Front perineal area;Buttocks;Right upper leg;Left upper leg;Right lower leg (including foot);Left lower leg (including foot) Bathing: 4: Steadying assist  FIM - Upper Body Dressing/Undressing Upper body dressing/undressing steps patient completed: Thread/unthread right bra strap;Thread/unthread left bra strap;Hook/unhook bra;Thread/unthread right sleeve of pullover shirt/dresss;Thread/unthread left sleeve of pullover shirt/dress;Put head through opening of pull over shirt/dress;Pull shirt over trunk Upper body dressing/undressing: 5: Set-up assist to: Obtain clothing/put away FIM - Lower Body Dressing/Undressing Lower body dressing/undressing steps patient completed: Thread/unthread right underwear leg;Thread/unthread left underwear leg;Pull underwear up/down;Thread/unthread right pants leg;Pull pants up/down Lower body dressing/undressing: 3: Mod-Patient completed 50-74% of tasks  FIM - Toileting Toileting steps completed by patient: Adjust clothing prior to toileting;Performs perineal hygiene;Adjust clothing after toileting Toileting Assistive Devices: Grab bar or rail for support Toileting: 4: Steadying assist  FIM - Scientist, research (physical sciences) Devices: Grab bars Toilet Transfers: 4-To toilet/BSC: Min A (steadying Pt. > 75%)  FIM - Bed/Chair Transfer Bed/Chair Transfer Assistive Devices: Therapist, occupational: 4: Supine > Sit: Min A (steadying Pt. > 75%/lift 1 leg)  FIM - Locomotion: Wheelchair Locomotion: Wheelchair: 0: Activity did not occur FIM - Locomotion: Ambulation Locomotion: Ambulation Assistive Devices: Designer, industrial/product Ambulation/Gait  Assistance: 4: Min assist Locomotion: Ambulation: 4: Travels 150 ft or more with minimal assistance (Pt.>75%)  Comprehension Comprehension Mode: Auditory Comprehension: 5-Follows basic conversation/direction: With extra time/assistive device  Expression Expression Mode: Verbal Expression: 4-Expresses basic 75 - 89% of the time/requires cueing 10 - 24% of the time. Needs helper to occlude trach/needs to repeat words.  Social Interaction Social Interaction: 5-Interacts appropriately 90% of the time - Needs monitoring or encouragement for participation or interaction.  Problem Solving Problem Solving: 3-Solves basic 50 - 74% of the time/requires cueing 25 - 49% of the time  Memory Memory: 3-Recognizes or recalls 50 - 74% of the time/requires cueing 25 - 49% of the time   1. DVT Prophylaxis/Anticoagulation: Mechanical: Sequential compression devices, below knee Bilateral lower extremities. BLE doppler negative.  2. Pain Management: added tylenol scheduled for leg symptoms, likely muscular. Exam inconsistent.Kpad and sports creme ordered with positive result. 3.Mood: affect improving. Ego support by team  -ritalin trial to boost attention and arousal. Increased to 10mg  with positive results. 4. Sphenoid wing meningioma with vision loss- left crani for debulking/resection of tumor.  5. Resection of tumor with right frontal ICH- on keppra for seizure prophylaxis. -Normalize sleep cycle.  6. Respiratory failure requiring trach: trach out without issues. 7. Staph UTI: abx  completed still with lower abd discomfort will reculture 8. ABLA: Add iron supplement. Recheck hgb stable. 9. HTN: Off tenormin currenty as blood pressures occasionally low. Monitor with bid checks and resume if blood pressures remain elevated. 10. FEN-eating well but occasionally needs cues for iniation. 11. Hallucinations and delusions likely are a combination of cognitive impairments as well as visual deficits. She has awareness to some extent that they aren't reality. She is non-agitated and isn't psychotic.  I wouldn't treat these hallucinations or delusions at this point.   LOS (Days) 13 A FACE TO FACE EVALUATION WAS PERFORMED  Jacqueline Christian T 07/20/2011, 8:13 AM

## 2011-07-20 NOTE — Progress Notes (Signed)
Social Work   Met yesterday with patient at request of OT.  Concern that pt showing s/s depression.  Spoke with pt at length about her current situation as well as past losses/ events that had been at the root of her chronic depression.  She has received counseling in the past, yet none in several years but is on an antidepressant prescribed by her primary doctor.  Pt agreed to a formal depression screen and the Beck Depression Inventory was administered and was scored at "4".  This is not indicative of an automatic referral to neuropsychology, yet given her physical presentation, description of "feel depressed" and tearfulness, I will ask for a referral.    Will follow up with Dr. Riley Kill at team conference today.  Adelyn Roscher

## 2011-07-20 NOTE — Progress Notes (Signed)
Speech Language Pathology Daily Session Note  Patient Details  Name: Jacqueline Christian MRN: [id number] Date of Birth: 1945/10/28  Today's Date: 07/20/2011 Time: 1100-1200 Time Calculation (min): 60 min  Short Term Goals:  SLP Short Term Goal 1 (Week 2): Pt will sustain attention to a functional task (grooming, self-feeding) for 1 minute with Max verbal cues SLP Short Term Goal 2 (Week 2): Pt will initiate with Mod verbal and visual cues during a functional task. SLP Short Term Goal 3 (Week 2): Pt will demonstrate functional problem solving with basic and familiar tasks with Mod A verbal and visual cues.  SLP Short Term Goal 4 (Week 2): Pt will follow 1 step commands with Mod verbal and gestural cues.   Skilled Therapeutic Interventions: Pt asleep in bed with Mod verbal cues and extra time needed to initiate getting into wheelchair. Pt very emotional and tearful today and reports she is feeling "sad." Clinician provided emotional support. Pt able to recall numbers of family members independently and dialed numbers with supervision verbal and visual cues. Initiated functional task with supervision verbal cues but unable to complete secondary to tearfulness. Redirected with conversation about her dog/animals and pt able to sustain attention to conversation for ~10 minutes.   Daily Session FIM:  Comprehension Comprehension: 4-Understands basic 75 - 89% of the time/requires cueing 10 - 24% of the time Expression Expression: 4-Expresses basic 75 - 89% of the time/requires cueing 10 - 24% of the time. Needs helper to occlude trach/needs to repeat words. Social Interaction Social Interaction: 3-Interacts appropriately 50 - 74% of the time - May be physically or verbally inappropriate. Problem Solving Problem Solving: 2-Solves basic 25 - 49% of the time - needs direction more than half the time to initiate, plan or complete simple activities Memory Memory: 3-Recognizes or recalls 50 - 74% of the  time/requires cueing 25 - 49% of the time Pain Pain Assessment Pain Assessment: No/denies pain Pain Score: 0-No pain Faces Pain Scale: No hurt PAINAD (Pain Assessment in Advanced Dementia) Breathing: normal  Therapy/Group: Individual Therapy  Corneshia Hines 07/20/2011, 12:25 PM

## 2011-07-20 NOTE — Progress Notes (Signed)
Physical Therapy Note  Patient Details  Name: WINOLA DRUM MRN: [id number] Date of Birth: 11/30/1945 Today's Date: 07/20/2011  9:15- 10:00 individual therapy pt denied pain this session.  Gait training without walker focusing on turns, stops , and walking backwards. Pt stated walking backwards made her dizzy and nauseous. Min assist overall/. Pt holding arms close to body to balance herself. Stair training x 8 with 2 rails for strengthening min assist. Gait training 2 x 150' with RW min assist. Pt tends to swirve. vc needed for safety with manuvering aroung obstacles and to keep RW on the floor with sidestepping. Pt tearfull today about defecits and hallucinations at night. Plan to discuss with nursing.   [name] 07/20/2011, 10:04 AM

## 2011-07-20 NOTE — Progress Notes (Signed)
Pt continues with visual hallucinations, increasing at HS; " snakes, men in window, sister in room."  Difficult to re-orient.Carlean Purl

## 2011-07-21 DIAGNOSIS — Z5189 Encounter for other specified aftercare: Secondary | ICD-10-CM

## 2011-07-21 DIAGNOSIS — F4323 Adjustment disorder with mixed anxiety and depressed mood: Secondary | ICD-10-CM

## 2011-07-21 DIAGNOSIS — F060 Psychotic disorder with hallucinations due to known physiological condition: Secondary | ICD-10-CM

## 2011-07-21 DIAGNOSIS — C700 Malignant neoplasm of cerebral meninges: Secondary | ICD-10-CM

## 2011-07-21 DIAGNOSIS — I619 Nontraumatic intracerebral hemorrhage, unspecified: Secondary | ICD-10-CM

## 2011-07-21 NOTE — Progress Notes (Signed)
Physical Therapy Note  Patient Details  Name: KRUTI HORACEK MRN: [id number] Date of Birth: 12/10/1945 Today's Date: 07/21/2011  9:00-10:00 individual therapy pt complained of pain in her legs at the end of the session.  Gait training 2 x 200' with RW minguard to supervision assist stair training x 8 with 2 rails min assist, dynamic balance to pitch and gather horseshoes including cognitive therapy with sequencing colors of horseshoes with max direct cues for task. Performed functional task for sustained attention to put dishes in dishwasher with mod vc to attend to task . pt does not demonstratee divided attention to talk and work at the same time.   [name] 07/21/2011, 10:15 AM

## 2011-07-21 NOTE — Patient Care Conference (Signed)
Inpatient RehabilitationTeam Conference Note Date: 07/20/2011   Time: 2:55 PM    Patient Name: Jacqueline Christian      Medical Record Number: [id number]  Date of Birth: 08/21/1945 Sex: Female         Room/Bed: 4028/4028-02 Payor Info: Payor: MEDICARE  Plan: MEDICARE PART A AND B  Product Type: *No Product type*     Admitting Diagnosis: Meningioma Resection, Crani for SDH  Admit Date/Time:  07/07/2011  2:34 PM Admission Comments: No comment available   Primary Diagnosis:  ICH (intracerebral hemorrhage) Principal Problem: ICH (intracerebral hemorrhage)  Patient Active Problem List  Diagnoses Date Noted  . ICH (intracerebral hemorrhage) 07/09/2011    Expected Discharge Date: Expected Discharge Date: 07/28/11  Team Members Present: Physician: Dr. Faith Rogue Case Manager Present: Melanee Spry, RN Social Worker Present: Amada Jupiter, LCSW Nurse Present: Carlean Purl, RN PT Present: Reggy Eye, PT OT Present: Ardis Rowan, COTA;Jennifer Marlis Edelson, OT SLP Present: Feliberto Gottron, SLP Other (Discipline and Name): Tora Duck, PPS Coordinator     Current Status/Progress Goal Weekly Team Focus  Medical   hallucinating still but non-agitated. needs cues to eat, slow to initiate  improved attention an cognitive awareness  see above   Bowel/Bladder   Continent; Verbalizing needs  Anticipates needs; uses call bell for needs  Timed toileting   Swallow/Nutrition/ Hydration   Regular textures and thin liquids, supervision  supervision-Min A  increased initiation   ADL's   supervision/steady A overall, min/mod verbal cues for initiation and attention to task  supervision overall  attenttion, sequencing, activity tolerance, family ed   Mobility   min assist overall  supervision overall except min assist in community  balance activity tolerance , sustained attention.   Communication   Min A  Min A  utilization of call bell for expression of wants/needs   Safety/Cognition/  Behavioral Observations  Mod-Max A  Min A  attention, initiation, problem solving    Pain   C/O of "aching" legs and occasional headache  managed, 2/10  offer prn's; observes for non-verbal s/s pain   Skin   mild irritation at groin  resolve upon discharge.  Skin will be free of breakdown and no s/s of infection  Nystatin powder to groin.  Frequent checks for incontinence      *See Interdisciplinary Assessment and Plan and progress notes for long and short-term goals  Barriers to Discharge: cog-behavioral issues    Possible Resolutions to Barriers:  rx above    Discharge Planning/Teaching Needs:  Home with husband as primary caregiver.  Husband states on 2/11, "I hope ya'll don't send her home too soon".        Team Discussion: Crying more-order for neuropsych to see.  Physically moving along, not so much cognitively.  Need family ed w/ husband & sister, Dewayne Hatch.   Revisions to Treatment Plan: none    Continued Need for Acute Rehabilitation Level of Care: The patient requires daily medical management by a physician with specialized training in physical medicine and rehabilitation for the following conditions: Daily direction of a multidisciplinary physical rehabilitation program to ensure safe treatment while eliciting the highest outcome that is of practical value to the patient.: Yes Daily medical management of patient stability for increased activity during participation in an intensive rehabilitation regime.: Yes Daily analysis of laboratory values and/or radiology reports with any subsequent need for medication adjustment of medical intervention for : Neurological problems;Other;Post surgical problems  Brock Ra 07/21/2011, 2:42 PM

## 2011-07-21 NOTE — Progress Notes (Signed)
Discussed with husband pt's progress and answered questions concerning discharge and prognosis regarding continued progress.  Husband stated he is anxious. Note reviewed and accurately reflects treatment session.

## 2011-07-21 NOTE — Progress Notes (Signed)
Speech Language Pathology Daily Session Note  Patient Details  Name: Jacqueline Christian MRN: [id number] Date of Birth: 04/05/1946  Today's Date: 07/21/2011 Time: 1100-1200 Time Calculation (min): 60 min  Short Term Goals:  SLP Short Term Goal 1 (Week 2): Pt will sustain attention to a functional task (grooming, self-feeding) for 1 minute with Max verbal cues SLP Short Term Goal 2 (Week 2): Pt will initiate with Mod verbal and visual cues during a functional task. SLP Short Term Goal 3 (Week 2): Pt will demonstrate functional problem solving with basic and familiar tasks with Mod A verbal and visual cues.  SLP Short Term Goal 4 (Week 2): Pt will follow 1 step commands with Mod verbal and gestural cues.   Skilled Therapeutic Interventions: Treatment focus on sustained attention and initiation of functional tasks. Treatment took place with another patient in the session. Pt demonstrated initiation of social interaction and conversation with the other pt independently but need Mod A verbal and questioning cues to appropriately demonstrate turn taking and topic maintenance. Mod A semantic questioning cues needed to verbally demonstrate anticipatory awareness for tasks she will need assistance with at home. Mod I for intellectual awareness of current situation and physical deficits and Min A verbal and questioning cues for intellectual awareness of cognitive deficits. Min-Mod A verbal and visual cues needed for initiation of tasks (transfer to wheelchair, etc.) which is overall improved from previous sessions.    Daily Session FIM:  Comprehension Comprehension: 4-Understands basic 75 - 89% of the time/requires cueing 10 - 24% of the time Expression Expression: 4-Expresses basic 75 - 89% of the time/requires cueing 10 - 24% of the time. Needs helper to occlude trach/needs to repeat words. Social Interaction Social Interaction: 4-Interacts appropriately 75 - 89% of the time - Needs redirection for  appropriate language or to initiate interaction. Problem Solving Problem Solving: 2-Solves basic 25 - 49% of the time - needs direction more than half the time to initiate, plan or complete simple activities Memory Memory: 3-Recognizes or recalls 50 - 74% of the time/requires cueing 25 - 49% of the time FIM - Eating Eating Activity: 5: Supervision/cues  Pain Pain Assessment Pain Assessment: No/denies pain  Therapy/Group: Individual Therapy  Jacqueline Christian 07/21/2011, 3:32 PM

## 2011-07-21 NOTE — Progress Notes (Signed)
Nutrition Follow-up  Intake variable, ranging from 25 - 75%. Per MD note, pt eating well but occasionally needs cues for initiation. Pt states that her intake is continuing to improve and she's satisfied with the meals she is receiving. Taking her supplements as scheduled.  Diet Order:  Regular Supplements: Ensure Clinical Strength BID and Ensure Pudding BID  Meds: Scheduled Meds:   . acetaminophen  500 mg Oral TID  . antiseptic oral rinse  15 mL Mouth Rinse QID  . chlorhexidine  15 mL Mouth Rinse BID  . famotidine  20 mg Oral BID  . feeding supplement  237 mL Oral BID  . feeding supplement  1 Container Oral BID WC  . ferrous sulfate  325 mg Oral BID  . levETIRAcetam  500 mg Per Tube BID  . methylphenidate  10 mg Oral BID  . Muscle Rub   Topical BID  . naphazoline-glycerin  1-2 drop Both Eyes QID  . nystatin   Topical TID  . pantoprazole  40 mg Oral Q1200   Continuous Infusions:  PRN Meds:.acetaminophen, alum & mag hydroxide-simeth, bisacodyl, diphenhydrAMINE, guaiFENesin-dextromethorphan, hydrocerin, HYDROcodone-acetaminophen, methocarbamol, polyethylene glycol, polyethylene glycol, promethazine, promethazine, promethazine, traZODone  Labs:  CMP     Component Value Date/Time   NA 141 07/08/2011 0625   K 3.8 07/08/2011 0625   CL 103 07/08/2011 0625   CO2 27 07/08/2011 0625   GLUCOSE 103* 07/08/2011 0625   BUN 13 07/08/2011 0625   CREATININE 0.73 07/08/2011 0625   CALCIUM 9.9 07/08/2011 0625   PROT 7.0 07/08/2011 0625   ALBUMIN 3.2* 07/08/2011 0625   AST 18 07/08/2011 0625   ALT 37* 07/08/2011 0625   ALKPHOS 102 07/08/2011 0625   BILITOT 0.4 07/08/2011 0625   GFRNONAA 87* 07/08/2011 0625   GFRAA >90 07/08/2011 0625     Intake/Output Summary (Last 24 hours) at 07/21/11 0956 Last data filed at 07/21/11 0900  Gross per 24 hour  Intake    480 ml  Output      0 ml  Net    480 ml    Weight Status:  77.6 kg, no new wt  Estimated needs:  1550 - 1700 kcal, 78 - 90 grams  protein  Nutrition Dx:  Increased energy expenditure - persists.  Goal:  Pt to consume >/= 75% of meals. Not met.  Intervention:   1. Please obtain new wt as able 2. Continue current interventions, pt continuing to improve 3. RD to follow nutrition care plan  Monitor:  Weight, labs, PO intake, supplement tolerance  Adair Laundry Pager #:  [phone]

## 2011-07-21 NOTE — Progress Notes (Signed)
Occupational Therapy Session Note  Patient Details  Name: Jacqueline Christian MRN: [id number] Date of Birth: [birth year]  Today's Date: 07/21/2011 Time:  1400-1430 60 minutes  Skilled Therapeutic Interventions/Progress Updates:  Pt participated in therapeutic activity group. Ambulated with RW to/from gym (supervision). In gym pt engaged in dynamic standing activities reaching out of base of support. Also engaged in cognitive activity (naming game using letter ball). Pt required max verbal cues for sustained attention and recall of how to play game. When asked to name an item, pt successful 75% of the time. Focus on activity tolerance, balance, and memory recall.    Pain: None stated  See FIM for current functional status  Therapy/Group: Group Therapy  Loleta Chance, OTAS 07/21/2011, 3:07 PM

## 2011-07-21 NOTE — Progress Notes (Signed)
Note reviewed and accurately reflects treatment session.   

## 2011-07-21 NOTE — Progress Notes (Signed)
Per State Regulation 482.30 This chart was reviewed for medical necessity with respect to the patient's Admission/Duration of stay. Pt, her sister & her husband [by phone] given team conf report:  ELOS 2/20  Supervision-min assist;  Husband plans to spend tonight & stay in am to continue family ed; sister plans to come in Friday 8am-12noon to continue her education.    Brock Ra                 Nurse Care Manager              Next Review Date: 07/23/11

## 2011-07-21 NOTE — Progress Notes (Signed)
Patient ID: Jacqueline Christian, female   DOB: 18-Oct-1945, 66 y.o.   MRN: [id number]  Subjective/Complaints: Review of Systems  Constitutional: Positive for malaise/fatigue.  All other systems reviewed and are negative.   Subjective/Complaints: Review of Systems  Constitutional: Positive for malaise/fatigue.  All other systems reviewed and are negative.  2/13- no new issues  Objective: Vital Signs: Blood pressure 97/53, pulse 74, temperature 98.7 F (37.1 C), temperature source Oral, resp. rate 18, height 5\' 3"  (1.6 m), weight 77.565 kg (171 lb), SpO2 96.00%. No results found. No results found for this basename: WBC:2,HGB:2,HCT:2,PLT:2 in the last 72 hours No results found for this basename: NA:2,K:2,CL:2,CO2:2,GLUCOSE:2,BUN:2,CREATININE:2,CALCIUM:2 in the last 72 hours CBG (last 3)  No results found for this basename: GLUCAP:3 in the last 72 hours  Wt Readings from Last 3 Encounters:  07/07/11 77.565 kg (171 lb)  07/07/11 77.7 kg (171 lb 4.8 oz)  07/07/11 77.7 kg (171 lb 4.8 oz)   BP Readings from Last 3 Encounters:  07/20/11 97/53  07/07/11 120/48  07/07/11 120/48   Physical Exam:  General appearance: distracted, no distress and slowed mentation Head: Normocephalic, without obvious abnormality, atraumatic Eyes: conjunctivae/corneas clear. PERRL, EOM's intact. Fundi benign. Ears: normal TM's and external ear canals both ears Nose: Nares normal. Septum midline. Mucosa normal. No drainage or sinus tenderness. Throat: lips, mucosa, and tongue normal; teeth and gums normal. Trach site closed.. Neck: no adenopathy, no carotid bruit, no JVD, supple, symmetrical, trachea midline and thyroid not enlarged, symmetric, no tenderness/mass/nodules Back: symmetric, no curvature. ROM normal. No CVA tenderness. Resp: clear to auscultation bilaterally Cardio: heart regular without murmur. GI: soft, non-tender; bowel sounds normal; no masses,  no organomegaly Extremities: extremities normal,  atraumatic, no cyanosis or edema Pulses: 2+ and symmetric Skin: Skin color, texture, turgor normal. No rashes or lesions Neurologic:very alert.  Still a little delusional about this weekend's drill. Knows she's in the hospital. Initiating nicely with breakfast Incision/Wound: wound on scalp clean and intact Musc: legs non edematous and really minimally tender to touch. Exam 2/13  Assessment/Plan: 1. Functional deficits secondary to meningioma with post op ICH which require 3+ hours per day of interdisciplinary therapy in a comprehensive inpatient.  rehab setting. Physiatrist is providing close team supervision and 24 hour management of active medical problems listed below. Physiatrist and rehab team continue to assess barriers to discharge/monitor patient progress toward functional and medical goals.   FIM: FIM - Bathing Bathing Steps Patient Completed: Chest;Right Arm;Left Arm;Abdomen;Front perineal area;Buttocks;Right upper leg;Left upper leg;Right lower leg (including foot);Left lower leg (including foot) Bathing: 4: Steadying assist  FIM - Upper Body Dressing/Undressing Upper body dressing/undressing steps patient completed: Thread/unthread right bra strap;Thread/unthread left bra strap;Hook/unhook bra;Thread/unthread right sleeve of pullover shirt/dresss;Thread/unthread left sleeve of pullover shirt/dress;Put head through opening of pull over shirt/dress;Pull shirt over trunk Upper body dressing/undressing: 5: Set-up assist to: Obtain clothing/put away FIM - Lower Body Dressing/Undressing Lower body dressing/undressing steps patient completed: Thread/unthread right underwear leg;Thread/unthread left underwear leg;Pull underwear up/down;Thread/unthread right pants leg;Pull pants up/down Lower body dressing/undressing: 3: Mod-Patient completed 50-74% of tasks  FIM - Toileting Toileting steps completed by patient: Adjust clothing prior to toileting;Performs perineal hygiene;Adjust  clothing after toileting Toileting Assistive Devices: Grab bar or rail for support Toileting: 4: Steadying assist  FIM - Diplomatic Services operational officer Devices: Grab bars Toilet Transfers: 4-To toilet/BSC: Min A (steadying Pt. > 75%)  FIM - Bed/Chair Transfer Bed/Chair Transfer Assistive Devices: Manufacturing systems engineer Transfer: 4: Supine > Sit: Min A (steadying  Pt. > 75%/lift 1 leg)  FIM - Locomotion: Wheelchair Locomotion: Wheelchair: 0: Activity did not occur FIM - Locomotion: Ambulation Locomotion: Ambulation Assistive Devices: Designer, industrial/product Ambulation/Gait Assistance: 4: Min assist Locomotion: Ambulation: 4: Travels 150 ft or more with minimal assistance (Pt.>75%)  Comprehension Comprehension Mode: Auditory Comprehension: 5-Follows basic conversation/direction: With extra time/assistive device  Expression Expression Mode: Verbal Expression: 4-Expresses basic 75 - 89% of the time/requires cueing 10 - 24% of the time. Needs helper to occlude trach/needs to repeat words.  Social Interaction Social Interaction: 5-Interacts appropriately 90% of the time - Needs monitoring or encouragement for participation or interaction.  Problem Solving Problem Solving: 3-Solves basic 50 - 74% of the time/requires cueing 25 - 49% of the time  Memory Memory: 3-Recognizes or recalls 50 - 74% of the time/requires cueing 25 - 49% of the time   1. DVT Prophylaxis/Anticoagulation: Mechanical: Sequential compression devices, below knee Bilateral lower extremities. BLE doppler negative.  2. Pain Management: added tylenol scheduled for leg symptoms, likely muscular. Exam inconsistent.Kpad and sports creme ordered with positive result 3.Mood: affect improving. Ego support by team  -ritalin trial at 10mg  4. Sphenoid wing meningioma with vision loss- left crani for debulking/resection of tumor.  5. Resection of tumor with right frontal ICH- on keppra for seizure prophylaxis. -Normalize sleep  cycle.  6. Respiratory failure requiring trach: trach out without issues. 7. Staph UTI: abx completed still with lower abd discomfort will reculture 8. ABLA: iron supplement. 9. HTN: Off tenormin currenty as blood pressures occasionally low. Monitor with bid checks and resume if blood pressures remain elevated. 10. FEN-eating well but occasionally needs cues for iniation. 11. Hallucinations and delusions likely are a combination of cognitive impairments as well as visual deficits. She has awareness to some extent that they aren't reality. She is non-agitated and isn't psychotic.  I wouldn't treat these hallucinations or delusions at this point.   -spoke with husband regarding them today as well.  -neuropsych input  LOS (Days) 13 A FACE TO FACE EVALUATION WAS PERFORMED  Lourine Alberico T 07/21/2011, 8:13 AM                  2/12---alert this am.  Still having some hallucinations but is non-agitated.  Objective: Vital Signs: Blood pressure 129/71, pulse 88, temperature 98.3 F (36.8 C), temperature source Axillary, resp. rate 16, height 5\' 3"  (1.6 m), weight 77.565 kg (171 lb), SpO2 95.00%. No results found. No results found for this basename: WBC:2,HGB:2,HCT:2,PLT:2 in the last 72 hours No results found for this basename: NA:2,K:2,CL:2,CO2:2,GLUCOSE:2,BUN:2,CREATININE:2,CALCIUM:2 in the last 72 hours CBG (last 3)  No results found for this basename: GLUCAP:3 in the last 72 hours  Wt Readings from Last 3 Encounters:  07/07/11 77.565 kg (171 lb)  07/07/11 77.7 kg (171 lb 4.8 oz)  07/07/11 77.7 kg (171 lb 4.8 oz)   BP Readings from Last 3 Encounters:  07/21/11 129/71  07/07/11 120/48  07/07/11 120/48   Physical Exam:  General appearance: distracted, no distress and slowed mentation Head: Normocephalic, without obvious abnormality, atraumatic Eyes: conjunctivae/corneas clear. PERRL, EOM's intact. Fundi benign. Ears: normal TM's and external ear canals both ears Nose:  Nares normal. Septum midline. Mucosa normal. No drainage or sinus tenderness. Throat: lips, mucosa, and tongue normal; teeth and gums normal. Trach site closed.. Neck: no adenopathy, no carotid bruit, no JVD, supple, symmetrical, trachea midline and thyroid not enlarged, symmetric, no tenderness/mass/nodules Back: symmetric, no curvature. ROM normal. No CVA tenderness. Resp: clear to auscultation bilaterally  Cardio: heart regular without murmur. GI: soft, non-tender; bowel sounds normal; no masses,  no organomegaly Extremities: extremities normal, atraumatic, no cyanosis or edema Pulses: 2+ and symmetric Skin: Skin color, texture, turgor normal. No rashes or lesions Neurologic:very alert.  Still a little delusional about this weekend's drill. Knows she's in the hospital. Initiating nicely with breakfast Incision/Wound: wound on scalp clean and intact Musc: legs non edematous and really minimally tender to touch. Exam 2/12--  Assessment/Plan: 1. Functional deficits secondary to meningioma with post op ICH which require 3+ hours per day of interdisciplinary therapy in a comprehensive inpatient.  rehab setting. Physiatrist is providing close team supervision and 24 hour management of active medical problems listed below. Physiatrist and rehab team continue to assess barriers to discharge/monitor patient progress toward functional and medical goals.   FIM: FIM - Bathing Bathing Steps Patient Completed: Chest;Right Arm;Left Arm;Abdomen;Front perineal area;Buttocks;Right upper leg;Left upper leg;Right lower leg (including foot);Left lower leg (including foot) Bathing: 4: Steadying assist  FIM - Upper Body Dressing/Undressing Upper body dressing/undressing steps patient completed: Thread/unthread right bra strap;Thread/unthread left bra strap;Hook/unhook bra;Thread/unthread right sleeve of pullover shirt/dresss;Thread/unthread left sleeve of pullover shirt/dress;Put head through opening of pull  over shirt/dress;Pull shirt over trunk Upper body dressing/undressing: 5: Set-up assist to: Obtain clothing/put away FIM - Lower Body Dressing/Undressing Lower body dressing/undressing steps patient completed: Thread/unthread right underwear leg;Thread/unthread left underwear leg;Pull underwear up/down;Thread/unthread right pants leg;Thread/unthread left pants leg;Pull pants up/down;Don/Doff right sock;Don/Doff left sock;Don/Doff right shoe;Don/Doff left shoe Lower body dressing/undressing: 5: Set-up assist to: Obtain clothing  FIM - Toileting Toileting steps completed by patient: Adjust clothing prior to toileting;Adjust clothing after toileting;Performs perineal hygiene Toileting Assistive Devices: Grab bar or rail for support Toileting: 5: Supervision: Safety issues/verbal cues  FIM - Diplomatic Services operational officer Devices: Grab bars Toilet Transfers: 4-To toilet/BSC: Min A (steadying Pt. > 75%)  FIM - Bed/Chair Transfer Bed/Chair Transfer Assistive Devices: Therapist, occupational: 5: Supine > Sit: Supervision (verbal cues/safety issues);5: Sit > Supine: Supervision (verbal cues/safety issues);4: Bed > Chair or W/C: Min A (steadying Pt. > 75%);4: Chair or W/C > Bed: Min A (steadying Pt. > 75%)  FIM - Locomotion: Wheelchair Locomotion: Wheelchair: 0: Activity did not occur FIM - Locomotion: Ambulation Locomotion: Ambulation Assistive Devices: Designer, industrial/product Ambulation/Gait Assistance: 4: Min assist Locomotion: Ambulation: 4: Travels 150 ft or more with minimal assistance (Pt.>75%)  Comprehension Comprehension Mode: Auditory Comprehension: 4-Understands basic 75 - 89% of the time/requires cueing 10 - 24% of the time  Expression Expression Mode: Verbal Expression: 4-Expresses basic 75 - 89% of the time/requires cueing 10 - 24% of the time. Needs helper to occlude trach/needs to repeat words.  Social Interaction Social Interaction: 4-Interacts appropriately 75 -  89% of the time - Needs redirection for appropriate language or to initiate interaction.  Problem Solving Problem Solving: 2-Solves basic 25 - 49% of the time - needs direction more than half the time to initiate, plan or complete simple activities  Memory Memory: 3-Recognizes or recalls 50 - 74% of the time/requires cueing 25 - 49% of the time   1. DVT Prophylaxis/Anticoagulation: Mechanical: Sequential compression devices, below knee Bilateral lower extremities. BLE doppler negative.  2. Pain Management: added tylenol scheduled for leg symptoms, likely muscular. Exam inconsistent.Kpad and sports creme ordered with positive result. 3.Mood: affect improving. Ego support by team  -ritalin trial to boost attention and arousal. Increased to 10mg  with positive results. 4. Sphenoid wing meningioma with vision loss- left crani for debulking/resection  of tumor.  5. Resection of tumor with right frontal ICH- on keppra for seizure prophylaxis. -Normalize sleep cycle.  6. Respiratory failure requiring trach: trach out without issues. 7. Staph UTI: abx completed still with lower abd discomfort will reculture 8. ABLA: Add iron supplement. Recheck hgb stable. 9. HTN: Off tenormin currenty as blood pressures occasionally low. Monitor with bid checks and resume if blood pressures remain elevated. 10. FEN-eating well but occasionally needs cues for iniation. 11. Hallucinations and delusions likely are a combination of cognitive impairments as well as visual deficits. She has awareness to some extent that they aren't reality. She is non-agitated and isn't psychotic.  I wouldn't treat these hallucinations or delusions at this point.   LOS (Days) 14 A FACE TO FACE EVALUATION WAS PERFORMED  Simranjit Thayer T 07/21/2011, 7:30 AM

## 2011-07-21 NOTE — Progress Notes (Signed)
Occupational Therapy Session Note  Patient Details  Name: Jacqueline Christian MRN: [id number] Date of Birth: Apr 12, 1946  Today's Date: 07/21/2011 Time: 0700-0800 Time Calculation (min): 60 min   Skilled Therapeutic Interventions/Progress Updates:  ADL retraining in room. Pt's husband present during 1/2 of session. Pt awake when entering room. Ambulated with RW to bathroom with supervision. Toileting, toilet transfer, and shower transfer with supervision. Bathed 10/10 parts sit/stand supervision. Shower and toileting required decreased verbal/tactile cues for initiation. Ambulated with RW to EOB for dressing. Dressing required max verbal/tactile cues for initiation and sustained attention. Focus on activity tolerance, initiation of tasks, functional ambulation, and sequencing.    Precautions:  Precautions Precautions: Fall Precaution Comments: decreased safety awareness, quick relaease belt Required Braces or Orthoses: No Restrictions Weight Bearing Restrictions: No    See FIM for current functional status  Therapy/Group: Individual Therapy  Loleta Chance, OTAS 07/21/2011, 10:06 AM

## 2011-07-22 ENCOUNTER — Encounter (HOSPITAL_COMMUNITY): Payer: Self-pay | Admitting: Psychology

## 2011-07-22 MED ORDER — LORATADINE 10 MG PO TABS
10.0000 mg | ORAL_TABLET | Freq: Every day | ORAL | Status: DC
  Administered 2011-07-22 – 2011-07-28 (×7): 10 mg via ORAL
  Filled 2011-07-22 (×9): qty 1

## 2011-07-22 MED ORDER — AZELASTINE HCL 0.1 % NA SOLN
2.0000 | Freq: Two times a day (BID) | NASAL | Status: DC
  Administered 2011-07-22 – 2011-07-28 (×13): 2 via NASAL
  Filled 2011-07-22: qty 30

## 2011-07-22 MED ORDER — PSEUDOEPHEDRINE HCL ER 120 MG PO TB12
120.0000 mg | ORAL_TABLET | Freq: Two times a day (BID) | ORAL | Status: AC
  Administered 2011-07-22 – 2011-07-24 (×6): 120 mg via ORAL
  Filled 2011-07-22 (×6): qty 1

## 2011-07-22 NOTE — Progress Notes (Signed)
Occupational Therapy Session Note  Patient Details  Name: Jacqueline Christian MRN: [id number] Date of Birth: 1946/04/16  Today's Date: 07/22/2011 Time: 1400-1430 Time Calculation (min): 30 min   Skilled Therapeutic Interventions/Progress Updates:  Pt's husband present during session. Pt completed oral hygiene standing at sink. Pt ambulated with RW to/from ADL apartment with close supervision. In apartment engaged in simulated shower transfer with shower seat. Husband participated in transfer. Family education and safety recommendations given. Focus on activity tolerance, transfers, functional ambulation, and family education.   Pain: Pain Assessment Pain Assessment: No/denies pain  See FIM for current functional status  Therapy/Group: Individual Therapy  Loleta Chance, OTAS 07/22/2011, 2:42 PM

## 2011-07-22 NOTE — Progress Notes (Signed)
Physical Therapy Note  Patient Details  Name: Jacqueline Christian MRN: [id number] Date of Birth: 29-Sep-1945 Today's Date: 07/22/2011  1000-1040 (40 minutes) individual Pain- no complaint of pain  Focus of treatment: Husband to be instructed in and redemonstrate safe bed transfers, car transfers, up/down 3 steps , safe ambulation with RW Treatment: Husband instructed in and redemonstrated safe guarding during the following: up/ down 3 steps HHA (both hands secondary to no rail to enter home); car transfer SBA with RW, bed mobility SBA . Husband rteports he may add rail to enter home secondary to patients apprehension on steps; Gait 150 feet RW SBA for safety.    Jacqueline Christian,JIM 07/22/2011, 10:55 AM

## 2011-07-22 NOTE — Progress Notes (Signed)
Patient ID: Jacqueline Christian, female   DOB: 11-11-1945, 66 y.o.   MRN: [id number] Patient ID: Jacqueline Christian, female   DOB: February 24, 1946, 66 y.o.   MRN: [id number]  Subjective/Complaints: Review of Systems  Constitutional: Positive for malaise/fatigue.  All other systems reviewed and are negative.   2/14---complains of congestion and ear pain. Less hallucinations last night (one only)  Objective: Vital Signs: Blood pressure 151/68, pulse 96, temperature 98.4 F (36.9 C), temperature source Oral, resp. rate 16, height 5\' 3"  (1.6 m), weight 77.32 kg (170 lb 7.4 oz), SpO2 98.00%. No results found. No results found for this basename: WBC:2,HGB:2,HCT:2,PLT:2 in the last 72 hours No results found for this basename: NA:2,K:2,CL:2,CO2:2,GLUCOSE:2,BUN:2,CREATININE:2,CALCIUM:2 in the last 72 hours CBG (last 3)  No results found for this basename: GLUCAP:3 in the last 72 hours  Wt Readings from Last 3 Encounters:  07/21/11 77.32 kg (170 lb 7.4 oz)  07/07/11 77.7 kg (171 lb 4.8 oz)  07/07/11 77.7 kg (171 lb 4.8 oz)   BP Readings from Last 3 Encounters:  07/22/11 151/68  07/07/11 120/48  07/07/11 120/48   Physical Exam:  General appearance: distracted, no distress and slowed mentation Head: Normocephalic, without obvious abnormality, atraumatic Eyes: conjunctivae/corneas clear. PERRL, EOM's intact. Fundi benign. Ears: normal TM's and external ear canals both ears Nose: Nares normal. Septum midline. Mucosa normal. No drainage or sinus tenderness. Throat: lips, mucosa, and tongue normal; teeth and gums normal. Trach site closed.. Neck: no adenopathy, no carotid bruit, no JVD, supple, symmetrical, trachea midline and thyroid not enlarged, symmetric, no tenderness/mass/nodules Back: symmetric, no curvature. ROM normal. No CVA tenderness. Resp: clear to auscultation bilaterally Cardio: heart regular without murmur. GI: soft, non-tender; bowel sounds normal; no masses,  no organomegaly Extremities:  extremities normal, atraumatic, no cyanosis or edema Pulses: 2+ and symmetric Skin: Skin color, texture, turgor normal. No rashes or lesions Neurologic:very alert.  Still a little delusional about this weekend's drill. Knows she's in the hospital. Initiating nicely with breakfast Incision/Wound: wound on scalp clean and intact Musc: legs non edematous and really minimally tender to touch. Exam 2/14  Assessment/Plan: 1. Functional deficits secondary to meningioma with post op ICH which require 3+ hours per day of interdisciplinary therapy in a comprehensive inpatient.  rehab setting. Physiatrist is providing close team supervision and 24 hour management of active medical problems listed below. Physiatrist and rehab team continue to assess barriers to discharge/monitor patient progress toward functional and medical goals.   FIM: FIM - Bathing Bathing Steps Patient Completed: Chest;Left upper leg;Right Arm;Left Arm;Abdomen;Front perineal area;Right lower leg (including foot);Buttocks;Left lower leg (including foot);Right upper leg Bathing: 5: Supervision: Safety issues/verbal cues  FIM - Upper Body Dressing/Undressing Upper body dressing/undressing steps patient completed: Thread/unthread right bra strap;Thread/unthread left bra strap;Hook/unhook bra;Thread/unthread right sleeve of pullover shirt/dresss;Thread/unthread left sleeve of pullover shirt/dress;Put head through opening of pull over shirt/dress;Pull shirt over trunk Upper body dressing/undressing: 5: Set-up assist to: Obtain clothing/put away FIM - Lower Body Dressing/Undressing Lower body dressing/undressing steps patient completed: Thread/unthread right underwear leg;Thread/unthread left underwear leg;Pull underwear up/down;Thread/unthread right pants leg;Thread/unthread left pants leg;Pull pants up/down;Don/Doff right shoe;Don/Doff left shoe Lower body dressing/undressing: 4: Min-Patient completed 75 plus % of tasks  FIM -  Toileting Toileting steps completed by patient: Adjust clothing prior to toileting;Performs perineal hygiene;Adjust clothing after toileting Toileting Assistive Devices: Grab bar or rail for support Toileting: 5: Supervision: Safety issues/verbal cues  FIM - Diplomatic Services operational officer Devices: Grab bars Toilet Transfers: 5-To toilet/BSC: Supervision (verbal  cues/safety issues)  FIM - Banker Devices: Therapist, occupational: 5: Supine > Sit: Supervision (verbal cues/safety issues)  FIM - Locomotion: Wheelchair Locomotion: Wheelchair: 0: Activity did not occur FIM - Locomotion: Ambulation Locomotion: Ambulation Assistive Devices: Designer, industrial/product Ambulation/Gait Assistance: 4: Min assist Locomotion: Ambulation: 4: Travels 150 ft or more with minimal assistance (Pt.>75%)  Comprehension Comprehension Mode: Auditory Comprehension: 4-Understands basic 75 - 89% of the time/requires cueing 10 - 24% of the time  Expression Expression Mode: Verbal Expression: 4-Expresses basic 75 - 89% of the time/requires cueing 10 - 24% of the time. Needs helper to occlude trach/needs to repeat words.  Social Interaction Social Interaction: 4-Interacts appropriately 75 - 89% of the time - Needs redirection for appropriate language or to initiate interaction.  Problem Solving Problem Solving: 2-Solves basic 25 - 49% of the time - needs direction more than half the time to initiate, plan or complete simple activities  Memory Memory: 3-Recognizes or recalls 50 - 74% of the time/requires cueing 25 - 49% of the time   1. DVT Prophylaxis/Anticoagulation: Mechanical: Sequential compression devices, below knee Bilateral lower extremities. BLE doppler negative.  2. Pain Management: added tylenol scheduled for leg symptoms, likely muscular. Exam inconsistent.Kpad and sports creme ordered with positive result.  3.Mood: affect improving. Ego support by  team  -ritalin trial to boost attention and arousal. Increased to 10mg  with positive results. 4. Sphenoid wing meningioma with vision loss- left crani for debulking/resection of tumor.  5. Resection of tumor with right frontal ICH- on keppra for seizure prophylaxis. -Normalize sleep cycle.  6. Respiratory failure requiring trach: trach out without issues. 7. Staph UTI: abx completed 8. ABLA: Added iron supplement. Recheck hgb stable. 9. HTN: Off tenormin currenty as blood pressures occasionally low. Monitor with bid checks and resume if blood pressures remain elevated. 10. FEN-eating well but occasionally needs cues for iniation. 11. Hallucinations and delusions likely are a combination of cognitive impairments as well as visual deficits. She has awareness to some extent that they aren't reality. These are slowly improving. i have discussed with the patient and her husband as well. 12. Nasal congestion: treat symptomatically LOS (Days) 15 A FACE TO FACE EVALUATION WAS PERFORMED  Tayjon Halladay T 07/22/2011, 7:59 AM

## 2011-07-22 NOTE — Progress Notes (Signed)
Occupational Therapy Session Note  Patient Details  Name: KENNDRA MORRIS MRN: [id number] Date of Birth: 14-Feb-1946  Today's Date: 07/22/2011 Time: 0800-0900 Time Calculation (min): 60 min   Skilled Therapeutic Interventions/Progress Updates:  ADL retraining/family education in room. Husband present and involved in session. Ambulated with RW to bathroom with SteadyA from husband. Bathed 10/10 parts, sit/stand StreadyA from husband. Required decreased verbal/tactile cues for initiation but still modA/maxA. Ambulated with RW to EOB for dressing. Dressing with husbands help for threading pants/underwear. Discussion with husband on safety at home. Focus on activity tolerance, family education, initiation of tasks, functional ambulation, and safety awareness.  Pain: Pain Assessment Pain Assessment: No/denies pain Pain Score:   9 Pain Type: Acute pain Pain Location: Ear Pain Orientation: Right;Left  See FIM for current functional status  Therapy/Group: Individual Therapy  Loleta Chance, OTAS 07/22/2011, 12:12 PM

## 2011-07-22 NOTE — Progress Notes (Signed)
Note reviewed and accurately reflects treatment session.   

## 2011-07-22 NOTE — Progress Notes (Signed)
Psychology Consultation  Name: Jacqueline Christian MRN: [id number] Date of Visit: 07/21/11  History: 66 year-old woman with a history of hypertension initially admitted with dizziness and visual disturbance. 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 Underwent a microdissection/debulking of hemangioma on 06/21/11 and required an emergent hematoma decompression for a postoperative subdural hematoma. She was admitted to [facility] on 06/3011. Has shown distractibility, slowed mentation, left field cut. She has reported visual hallucinations She has been on a Ritalin trial since 07/12/11 to enhance attention and arousal.   Reason for Referral:  Concern expressed by her MSW that she has been showing signs and symptoms of depression within the past few days manifested by increased tearfulness and statements of sad mood in context of emerging awareness of her situation.   Interview: Jacqueline Christian was interviewed with her husband present. She appeared alert and was cooperative with this examination. She was oriented in all spheres but relied on a calendar to state the date. She displayed occasional attentional lapses resulting in tangential responses to questions. Her demeanor was pleasant. Mood was anxious without signs of agitation/restlessness. Affect appeared constricted in range. No signs of lability. She could state her medical diagnosis and cited decreased balance and concentration as her only deficits. She reported a diffuse sense of unease (e.g., "things are not right here", "I'm scared") since a "shooter drill" that occurred in the hospital four days ago as well specific fear due to multiple hallucinations of a snake in her room. She stated apparent delusional or confabulatory thoughts regarding staff comments she has "overheard" that confirm the idea of a real snake or that the shooter drill will reoccur. She denied experiencing  thoughts of persecution or reference, feeling in danger from others or having been mistreated in any manner. Her husband stated his belief that she has been most prone to hallucinations at night. No stressors other than her health condition were reported.  She has been married to her second husband since 1999. She has two adult children with her first husband, who committed suicide in 1989. She reported a prior history of depression with onset in 1989 as triggered by her first husband's suicide. She was evaluated by a psychiatrist in 1990 and prescribed antidepressant medications. No further mental health contacts after 1991. Her personal physician has reportedly prescribed her antidepressants since then. No reported history of suicidal behavior, mood instability or psychotic symptoms. According to her husband of 14 years, she had been functioning well and relatively free of emotional symptoms for as long as he has known her.   Impressions: Acute increase in anxiety and dysphoria coupled with associated mild paranoia triggered by her misunderstanding of a "shooter drill" four days ago as well as by ongoing visual hallucinations of a snake. She has had delusional or confabulatory thoughts regarding staff's response to her hallucinations that have confirmed her fears. She has partial insight into her hallucinations but no insight into her confabulated or delusional ideas. She is also experiencing a sense of diminished control in life secondary to her emerging awareness of her neurological deficits. She reported periodic sad mood over the past four days associated with her emerging awareness of deficits though without hopelessness, loss of motivation or suicidal ideation. Her possible tendency to perseverate to ideas may be exacerbating her emotional distress. No report of thoughts of harming self or others, nor any plans to elope.  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 Reiterated that her perceptions are hallucinations caused by her brain disorder. Reassured her that visual hallucinations are often a symptom of brain dysfunction. 2. Encouraged her to avoid hypervigilant scanning of her environment for threats. Recommended that she listen to music to distract her from her fears. Husband's presence at night likely to be calming. 3. Psychology will monitor while on unit. Need for any follow-up mental health services as outpatient to be determined after consultation with MSW and treatment team when closer to discharge.    Gladstone Pih, Ph.D 07/22/2011, Time: 10:45 a.m.

## 2011-07-22 NOTE — Progress Notes (Signed)
Speech Language Pathology Daily Session Note  Patient Details  Name: Jacqueline Christian MRN: [id number] Date of Birth: April 24, 1946  Today's Date: 07/22/2011 Time: 1100-1200 Time Calculation (min): 60 min  Short Term Goals: Week 2: SLP Short Term Goal 1 (Week 2): Pt will sustain attention to a functional task (grooming, self-feeding) for 1 minute with Max verbal cues SLP Short Term Goal 2 (Week 2): Pt will initiate with Mod verbal and visual cues during a functional task. SLP Short Term Goal 3 (Week 2): Pt will demonstrate functional problem solving with basic and familiar tasks with Mod A verbal and visual cues.  SLP Short Term Goal 4 (Week 2): Pt will follow 1 step commands with Mod verbal and gestural cues.   Skilled Therapeutic Interventions: Treatment focused on functional cognition and family education with patient's husband (Donnie).  Patient much more initiative, socially and verbally as compared to the last time this SLP treated her.  Patient discussed how she has been more sad over the last few days and recalled "Dr. Herma Carson" (neuropsychologist) meeting her to address these issues as well as visual hallucinations.  All of this information deemed accurate in terms of short term memory.  Functional time planning task with initial minimal verbal prompts for sustained attention for 30-40 seconds, however as task progress, patient required max verbal prompts to sustain attention, initiate writing the next item and progressing forward.  Patient unaware of this skilled need.  Husband educated on how her attention and initiation will continue to be challenged in all tasks once home.  Donnie verbalized his understanding and shared several examples of similar issues he has witnessed in the last 2 days.  Further education with husband is warranted to ensure he can appropriately and timely prompt his wife through basic, sustained attention functional tasks.  Daily Session Precautions/Restrictions : none   FIM:    Comprehension Comprehension Mode: Auditory Comprehension: 5-Understands basic 90% of the time/requires cueing < 10% of the time Expression Expression Mode: Verbal Expression: 5-Expresses basic 90% of the time/requires cueing < 10% of the time. Social Interaction Social Interaction: 4-Interacts appropriately 75 - 89% of the time - Needs redirection for appropriate language or to initiate interaction. Problem Solving Problem Solving: 2-Solves basic 25 - 49% of the time - needs direction more than half the time to initiate, plan or complete simple activities Memory Memory: 3-Recognizes or recalls 50 - 74% of the time/requires cueing 25 - 49% of the time General    Pain Pain Assessment Pain Assessment: No/denies pain Pain Score:   9 Pain Type: Acute pain Pain Location: Ear Pain Orientation: Right;Left  Therapy/Group: Individual Therapy  Myra Rude, M.S.,CCC-SLP Pager [phone]  07/22/2011, 12:16 PM

## 2011-07-22 NOTE — Progress Notes (Signed)
Patient Details  Name: Jacqueline Christian MRN: [id number] Date of Birth: 16-Oct-1945  Today's Date: 07/22/2011 Time: 4098-1191 Skilled Therapeutic Interventions/Progress Updates: Family education with pt, pt's husband, & pt's sister on types of leisure activities for pt to participate in post discharge.  Discussed safety concerns, setting reasonable expectations, energy conservation, identification of over-stimulation and fatigue.  PT required mod cues for safety. C/o BLE pain, RN aware. Therapy/Group: Individual Therapy  Activity Level: Simple:  Level of assist: mod verbal cues  [name] 07/22/2011, 3:49 PM

## 2011-07-23 DIAGNOSIS — C700 Malignant neoplasm of cerebral meninges: Secondary | ICD-10-CM

## 2011-07-23 DIAGNOSIS — I619 Nontraumatic intracerebral hemorrhage, unspecified: Secondary | ICD-10-CM

## 2011-07-23 DIAGNOSIS — Z5189 Encounter for other specified aftercare: Secondary | ICD-10-CM

## 2011-07-23 NOTE — Progress Notes (Signed)
Occupational Therapy Session Note  Patient Details  Name: JEANICE DEMPSEY MRN: [id number] Date of Birth: Mar 16, 1946  Today's Date: 07/23/2011 Time: 0900-0955 Time Calculation (min): 55 min   Skilled Therapeutic Interventions/Progress Updates:  ADL retraining/family education in room. Sister present and involved in session. Ambulated with RW to bathroom with SteadyA from sister. Bathed 10/10 parts, sit/stand supervision from sister. Ambulated with RW to EOB for dressing. Sister verbalized understanding of current functional status and that she felt comfortable with caring for [facility]. Focus on activity tolerance, family education, initiation of tasks, functional ambulation, and safety awareness.   Pain: Pain Assessment None stated   See FIM for current functional status  Therapy/Group: Individual Therapy  Loleta Chance, OTAS 07/23/2011, 11:14 AM

## 2011-07-23 NOTE — Progress Notes (Signed)
Per State Regulation 482.30 This chart was reviewed for medical necessity with respect to the patient's Admission/Duration of stay. Pt continues to progress-husband & sister receiving hands on family ed.   Brock Ra                 Nurse Care Manager              Next Review Date: 07/26/11

## 2011-07-23 NOTE — Progress Notes (Signed)
Note reviewed and accurately reflects treatment session.   

## 2011-07-23 NOTE — Progress Notes (Signed)
Speech Language Pathology Weekly Progress & Session Note  Patient Details  Name: Jacqueline Christian MRN: [id number] Date of Birth: [birth year]  Today's Date: 07/23/2011 Time: 1115-1200 Time Calculation (min): 45 min  Short Term Goals:  SLP Short Term Goal 1 (Week 2): Pt will sustain attention to a functional task (grooming, self-feeding) for 1 minute with Max verbal cues SLP Short Term Goal 1 - Progress (Week 2): Met SLP Short Term Goal 2 (Week 2): Pt will initiate with Mod verbal and visual cues during a functional task. SLP Short Term Goal 2 - Progress (Week 2): Met SLP Short Term Goal 3 (Week 2): Pt will demonstrate functional problem solving with basic and familiar tasks with Mod A verbal and visual cues.  SLP Short Term Goal 3 - Progress (Week 2): Met SLP Short Term Goal 4 (Week 2): Pt will follow 1 step commands with Mod verbal and gestural cues.  SLP Short Term Goal 4 - Progress (Week 2): Met  New Short Term Goals:  SLP Short Term Goal 1 (Week 3): Pt will sustain attention to a functional task for ~5 minutes with Min A verbal and visual cues.  SLP Short Term Goal 2 (Week 3): Pt will demonstrate initiation with functional and familiar tasks with Min A verbal and visual cues. SLP Short Term Goal 3 (Week 3): Pt will demonstrate functional problem solving for basic and familiar tasks with Min A verbal and visual cues.   Weekly Progress Updates: Pt has made great progress and has met all STG's this reporting period. Currently, pt is overall Min-Mod A for initiation, sustained attention to tasks, functional problem solving, and anticipatory awareness and Min A-Supervision for recall of newly learned information. Pt/family education ongoing. Pt would benefit from continued skilled SLP services to maximize overall cognitive function and impendence for discharge home.   Daily Session Therapeutic Intervention: Treatment focus on family education with pt's sister Dewayne Hatch in regards to current cognitive  function and strategies to utilize at home to increase overall problem solving, initiation, attention, memory and safety. Pt and sister verbalized and demonstrated understanding and handouts given.  FIM:  Comprehension Comprehension Mode: Auditory Comprehension: 5-Follows basic conversation/direction: With extra time/assistive device Expression Expression Mode: Verbal Expression: 5-Expresses basic needs/ideas: With extra time/assistive device Social Interaction Social Interaction: 5-Interacts appropriately 90% of the time - Needs monitoring or encouragement for participation or interaction. Problem Solving Problem Solving: 4-Solves basic 75 - 89% of the time/requires cueing 10 - 24% of the time Memory Memory: 5-Recognizes or recalls 90% of the time/requires cueing < 10% of the time FIM - Eating Eating Activity: 5: Supervision/cues Pain Pain Assessment Pain Assessment: No/denies pain Pain Score:   1 Cognition: Overall Cognitive Status: Impaired Arousal/Alertness: Awake/alert Orientation Level: Oriented X4 Attention: Sustained Focused Attention: Appears intact Sustained Attention: Impaired Sustained Attention Impairment: Functional basic;Verbal basic Memory: Impaired Memory Impairment: Decreased recall of new information Awareness: Impaired Awareness Impairment: Anticipatory impairment Problem Solving: Impaired Problem Solving Impairment: Functional basic;Verbal basic Executive Function: Initiating;Organizing;Sequencing Sequencing: Impaired Sequencing Impairment: Functional basic Organizing: Impaired Organizing Impairment: Functional basic Initiating: Impaired Initiating Impairment: Functional basic Behaviors: Perseveration Safety/Judgment: Impaired Oral/Motor: Oral Motor/Sensory Function Overall Oral Motor/Sensory Function: Appears within functional limits for tasks assessed Labial ROM: Within Functional Limits Labial Symmetry: Within Functional Limits Labial Strength:  Within Functional Limits Lingual ROM: Within Functional Limits Lingual Symmetry: Within Functional Limits Lingual Strength: Reduced Facial ROM: Within Functional Limits Facial Symmetry: Within Functional Limits Facial Strength: Within Functional Limits Facial Sensation: Within Functional Limits  Velum: Within Functional Limits Mandible: Within Functional Limits Motor Speech Overall Motor Speech: Appears within functional limits for tasks assessed Respiration: Within functional limits Phonation: Normal Resonance: Within functional limits Articulation: Within functional limitis Intelligibility: Intelligible Motor Planning: Witnin functional limits Comprehension: Auditory Comprehension Overall Auditory Comprehension: Impaired Yes/No Questions: Within Functional Limits Basic Biographical Questions: 76-100% accurate Basic Immediate Environment Questions: 75-100% accurate Commands: Within Functional Limits One Step Basic Commands: 75-100% accurate Two Step Basic Commands: 75-100% accurate Conversation: Simple Other Conversation Comments: Pt reposnded to ~50% of questions. Pt answered yes/no biorgraphical questions correctly and verbalized the names of her children and husband. Deficits impacted by attention and iniation.  Interfering Components: Attention;Processing speed EffectiveTechniques: Extra processing time;Visual/Gestural cues Visual Recognition/Discrimination Discrimination: Within Function Limits Reading Comprehension Reading Status: Within funtional limits Expression: Expression Primary Mode of Expression: Verbal Verbal Expression Overall Verbal Expression: Impaired Initiation: No impairment Automatic Speech: Social Response Level of Generative/Spontaneous Verbalization: Conversation Repetition: No impairment Naming: No impairment Pragmatics: Impairment Impairments: Topic maintenance;Turn Taking Interfering Components: Attention Effective Techniques: Open ended  questions;Semantic cues Other Verbal Expression Comments: Pt will initiate basic needs. "I need to go to the bathroom."  Written Expression Written Expression: Not tested  Therapy/Group: Individual Therapy  Dilcia Rybarczyk 07/23/2011, 3:36 PM

## 2011-07-23 NOTE — Progress Notes (Signed)
Subjective/Complaints: Review of Systems  Constitutional: Positive for malaise/fatigue.  All other systems reviewed and are negative.   2/15- slept well. Congestion better  Objective: Vital Signs: Blood pressure 129/78, pulse 86, temperature 97.9 F (36.6 C), temperature source Oral, resp. rate 18, height 5\' 3"  (1.6 m), weight 77.32 kg (170 lb 7.4 oz), SpO2 94.00%. No results found. No results found for this basename: WBC:2,HGB:2,HCT:2,PLT:2 in the last 72 hours No results found for this basename: NA:2,K:2,CL:2,CO2:2,GLUCOSE:2,BUN:2,CREATININE:2,CALCIUM:2 in the last 72 hours CBG (last 3)  No results found for this basename: GLUCAP:3 in the last 72 hours  Wt Readings from Last 3 Encounters:  07/21/11 77.32 kg (170 lb 7.4 oz)  07/07/11 77.7 kg (171 lb 4.8 oz)  07/07/11 77.7 kg (171 lb 4.8 oz)   BP Readings from Last 3 Encounters:  07/23/11 129/78  07/07/11 120/48  07/07/11 120/48   Physical Exam:  General appearance: distracted, no distress and slowed mentation Head: Normocephalic, without obvious abnormality, atraumatic Eyes: conjunctivae/corneas clear. PERRL, EOM's intact. Fundi benign. Ears: normal TM's and external ear canals both ears Nose: Nares normal. Septum midline. Mucosa normal. No drainage or sinus tenderness. less nasal sounding. Throat: lips, mucosa, and tongue normal; teeth and gums normal. Trach site closed.. Neck: no adenopathy, no carotid bruit, no JVD, supple, symmetrical, trachea midline and thyroid not enlarged, symmetric, no tenderness/mass/nodules Back: symmetric, no curvature. ROM normal. No CVA tenderness. Resp: clear to auscultation bilaterally Cardio: heart regular without murmur. GI: soft, non-tender; bowel sounds normal; no masses,  no organomegaly Extremities: extremities normal, atraumatic, no cyanosis or edema Pulses: 2+ and symmetric Skin: Skin color, texture, turgor normal. No rashes or lesions Neurologic:very alert.  Still a little  delusional about this weekend's drill. Knows she's in the hospital. Initiating nicely with breakfast Incision/Wound: skin clean. Musc: legs non edematous and really minimally tender to touch. Exam 2/15  Assessment/Plan: 1. Functional deficits secondary to meningioma with post op ICH which require 3+ hours per day of interdisciplinary therapy in a comprehensive inpatient.  rehab setting. Physiatrist is providing close team supervision and 24 hour management of active medical problems listed below. Physiatrist and rehab team continue to assess barriers to discharge/monitor patient progress toward functional and medical goals.   FIM: FIM - Bathing Bathing Steps Patient Completed: Right Arm;Left Arm;Abdomen;Front perineal area;Buttocks;Right upper leg;Right lower leg (including foot);Left upper leg;Left lower leg (including foot) Bathing: 5: Set-up assist to: Obtain items  FIM - Upper Body Dressing/Undressing Upper body dressing/undressing steps patient completed: Thread/unthread right bra strap;Thread/unthread left bra strap;Hook/unhook bra;Thread/unthread right sleeve of pullover shirt/dresss;Thread/unthread left sleeve of pullover shirt/dress;Put head through opening of pull over shirt/dress;Pull shirt over trunk Upper body dressing/undressing: 5: Set-up assist to: Obtain clothing/put away FIM - Lower Body Dressing/Undressing Lower body dressing/undressing steps patient completed: Pull underwear up/down;Pull pants up/down Lower body dressing/undressing: 2: Max-Patient completed 25-49% of tasks  FIM - Toileting Toileting steps completed by patient: Adjust clothing prior to toileting Toileting Assistive Devices: Grab bar or rail for support Toileting: 5: Supervision: Safety issues/verbal cues  FIM - Diplomatic Services operational officer Devices: Therapist, music Transfers: 5-To toilet/BSC: Supervision (verbal cues/safety issues)  FIM - Landscape architect Devices: Therapist, occupational: 4: Supine > Sit: Min A (steadying Pt. > 75%/lift 1 leg)  FIM - Locomotion: Wheelchair Locomotion: Wheelchair: 0: Activity did not occur FIM - Locomotion: Ambulation Locomotion: Ambulation Assistive Devices: Designer, industrial/product Ambulation/Gait Assistance: 5: Supervision Locomotion: Ambulation: 5: Travels 150 ft or more with supervision/safety  issues  Comprehension Comprehension Mode: Auditory Comprehension: 5-Understands basic 90% of the time/requires cueing < 10% of the time  Expression Expression Mode: Verbal Expression: 5-Expresses basic 90% of the time/requires cueing < 10% of the time.  Social Interaction Social Interaction: 4-Interacts appropriately 75 - 89% of the time - Needs redirection for appropriate language or to initiate interaction.  Problem Solving Problem Solving: 2-Solves basic 25 - 49% of the time - needs direction more than half the time to initiate, plan or complete simple activities  Memory Memory: 3-Recognizes or recalls 50 - 74% of the time/requires cueing 25 - 49% of the time   1. DVT Prophylaxis/Anticoagulation: Mechanical: Sequential compression devices, below knee Bilateral lower extremities. BLE doppler negative.  2. Pain Management: added tylenol scheduled for leg symptoms, likely muscular. Exam inconsistent.Kpad and sports creme ordered with positive result.  3.Mood: affect improving. Ego support by team  -ritalin trial helpful. 10mg . 4. Sphenoid wing meningioma with vision loss- left crani for debulking/resection of tumor.  5. Resection of tumor with right frontal ICH- on keppra for seizure prophylaxis. -Normalize sleep cycle.  6. Respiratory failure requiring trach: trach out without issues. 7. Staph UTI: abx completed 8. ABLA: Added iron supplement. Recheck hgb stable. 9. HTN: Off tenormin currenty as blood pressures occasionally low. Monitor with bid checks and resume if blood pressures remain  elevated. 10. FEN-eating well but occasionally needs cues for iniation. 11. Hallucinations and delusions likely are a combination of cognitive impairments as well as visual deficits. She has awareness to some extent that they aren't reality. These are slowly improving. i have discussed with the patient and her husband as well. i don't see that she had any last night 12. Nasal congestion: treat symptomatically. Better today LOS (Days) 16 A FACE TO FACE EVALUATION WAS PERFORMED  Wilhelm Ganaway T 07/23/2011, 6:27 AM

## 2011-07-23 NOTE — Progress Notes (Signed)
Occupational Therapy Weekly Progress Note  Patient Details  Name: Jacqueline Christian MRN: [id number] Date of Birth: 1945-06-20  Today's Date: 07/23/2011  Patient has met 5 of 5 short term goals. She is currently requiring min cues for UB dressing, supervision for sit/stand, initiating grooming tasks at the sink with mod cueing, attending to her left side, and maintaining standing balance with supervision. Pt consistently requires verbal cues for initiation and sustained attention. Pt appears to be feeling better emotionally since her husband and sister have been around.  FAmily education is ongoing with multiple family members.   Patient continues to demonstrate the following deficits:muscle weakness, impaired timing and sequencing, decreased initiation, decreased attention, decreased awareness, decreased problem solving, decreased safety awareness, decreased memory and delayed processing and decreased balance strategies and therefore will continue to benefit from skilled OT intervention to enhance overall performance with BADL.  Patient progressing toward long term goals..  Continue plan of care.   See FIM for current functional status  Therapy/Group: Individual Therapy  Loleta Chance, OTAS 07/23/2011, 11:31 AM

## 2011-07-23 NOTE — Progress Notes (Signed)
Physical Therapy Note  Patient Details  Name: Jacqueline Christian MRN: [id number] Date of Birth: [birth year] Today's Date: 07/23/2011  10:00- 11:00 individual therapy pt complain of head ache and nursing gave tylenol earlier.   Family education with pt's sister for gait with HHA and RW, car transfer, and stair training and cognition. suggested using a schedule and performing functional task for home task verses leg exercises.   [name] 07/23/2011, 12:05 PM

## 2011-07-23 NOTE — Progress Notes (Signed)
Physical Therapy Note  Patient Details  Name: Jacqueline Christian MRN: [id number] Date of Birth: 1946/01/25 Today's Date: 07/23/2011  Time: 1400-1430 30 minutes  No c/o pain.  Treatment focus on gait training with and without RW in household environment with close supervision with RW, min A without RW, pt with delayed balance reactions.  Pt requires min-mod cues for sequencing and safety with obstacle negotiation in tight spaces in home environment.  Controlled environment gait with close supervision with RW >150'.  Stair training with min A, cues for safety and step to pattern to decrease fatigue.  Pt able to negotiate flight of stairs with 1 handrail with min A.  Overall pt with much improved initiation and activity tolerance today.  Individual therapy   Lourdez Mcgahan 07/23/2011, 3:13 PM

## 2011-07-24 LAB — URINALYSIS, ROUTINE W REFLEX MICROSCOPIC
Bilirubin Urine: NEGATIVE
Glucose, UA: NEGATIVE mg/dL
Hgb urine dipstick: NEGATIVE
Specific Gravity, Urine: 1.011 (ref 1.005–1.030)
Urobilinogen, UA: 0.2 mg/dL (ref 0.0–1.0)

## 2011-07-24 NOTE — Progress Notes (Signed)
Patient ID: Jacqueline Christian, female   DOB: Sep 08, 1945, 66 y.o.   MRN: [id number]  Subjective/Complaints: Review of Systems  Constitutional: Positive for malaise/fatigue.  All other systems reviewed and are negative.   2/16-  A little pain in bladder region.  A couple hallucinations last night.  Objective: Vital Signs: Blood pressure 154/72, pulse 87, temperature 98.3 F (36.8 C), temperature source Oral, resp. rate 17, height 5\' 3"  (1.6 m), weight 77.32 kg (170 lb 7.4 oz), SpO2 94.00%. No results found. No results found for this basename: WBC:2,HGB:2,HCT:2,PLT:2 in the last 72 hours No results found for this basename: NA:2,K:2,CL:2,CO2:2,GLUCOSE:2,BUN:2,CREATININE:2,CALCIUM:2 in the last 72 hours CBG (last 3)  No results found for this basename: GLUCAP:3 in the last 72 hours  Wt Readings from Last 3 Encounters:  07/21/11 77.32 kg (170 lb 7.4 oz)  07/07/11 77.7 kg (171 lb 4.8 oz)  07/07/11 77.7 kg (171 lb 4.8 oz)   BP Readings from Last 3 Encounters:  07/24/11 154/72  07/07/11 120/48  07/07/11 120/48   Physical Exam:  General appearance: distracted, no distress and slowed mentation Head: Normocephalic, without obvious abnormality, atraumatic Eyes: conjunctivae/corneas clear. PERRL, EOM's intact. Fundi benign. Ears: normal TM's and external ear canals both ears Nose: Nares normal. Septum midline. Mucosa normal. No drainage or sinus tenderness. less nasal sounding. Throat: lips, mucosa, and tongue normal; teeth and gums normal. Trach site closed.. Neck: no adenopathy, no carotid bruit, no JVD, supple, symmetrical, trachea midline and thyroid not enlarged, symmetric, no tenderness/mass/nodules Back: symmetric, no curvature. ROM normal. No CVA tenderness. Resp: clear to auscultation bilaterally Cardio: heart regular without murmur. GI: soft, non-tender; bowel sounds normal; no masses,  no organomegaly Extremities: extremities normal, atraumatic, no cyanosis or edema Pulses: 2+ and  symmetric Skin: Skin color, texture, turgor normal. No rashes or lesions Neurologic:very alert.  Still a little delusional about this weekend's drill. Knows she's in the hospital. Initiating nicely with breakfast Incision/Wound: skin clean. Musc: legs non edematous and really minimally tender to touch. Exam 2/16  Assessment/Plan: 1. Functional deficits secondary to meningioma with post op ICH which require 3+ hours per day of interdisciplinary therapy in a comprehensive inpatient.  rehab setting. Physiatrist is providing close team supervision and 24 hour management of active medical problems listed below. Physiatrist and rehab team continue to assess barriers to discharge/monitor patient progress toward functional and medical goals.   FIM: FIM - Bathing Bathing Steps Patient Completed: Right Arm;Left Arm;Abdomen;Front perineal area;Buttocks;Right upper leg;Right lower leg (including foot);Left upper leg;Left lower leg (including foot);Chest Bathing: 5: Set-up assist to: Obtain items  FIM - Upper Body Dressing/Undressing Upper body dressing/undressing steps patient completed: Thread/unthread right bra strap;Thread/unthread left bra strap;Hook/unhook bra;Thread/unthread right sleeve of pullover shirt/dresss;Thread/unthread left sleeve of pullover shirt/dress;Put head through opening of pull over shirt/dress;Pull shirt over trunk Upper body dressing/undressing: 5: Set-up assist to: Obtain clothing/put away FIM - Lower Body Dressing/Undressing Lower body dressing/undressing steps patient completed: Pull underwear up/down;Pull pants up/down;Thread/unthread right underwear leg;Thread/unthread left underwear leg;Thread/unthread right pants leg;Thread/unthread left pants leg;Don/Doff right sock;Don/Doff left sock;Don/Doff right shoe;Don/Doff left shoe Lower body dressing/undressing: 5: Set-up assist to: Obtain clothing  FIM - Toileting Toileting steps completed by patient: Performs perineal  hygiene;Adjust clothing prior to toileting;Adjust clothing after toileting Toileting Assistive Devices: Grab bar or rail for support Toileting: 5: Supervision: Safety issues/verbal cues  FIM - Diplomatic Services operational officer Devices: Art gallery manager Transfers: 5-To toilet/BSC: Supervision (verbal cues/safety issues)  FIM - Banker Devices: Manufacturing systems engineer  Transfer: 6: Supine > Sit: No assist;6: Sit > Supine: No assist;4: Bed > Chair or W/C: Min A (steadying Pt. > 75%)  FIM - Locomotion: Wheelchair Locomotion: Wheelchair: 0: Activity did not occur FIM - Locomotion: Ambulation Locomotion: Ambulation Assistive Devices: Designer, industrial/product Ambulation/Gait Assistance: 4: Min assist Locomotion: Ambulation: 4: Travels 150 ft or more with minimal assistance (Pt.>75%)  Comprehension Comprehension Mode: Auditory Comprehension: 5-Understands basic 90% of the time/requires cueing < 10% of the time  Expression Expression Mode: Verbal Expression: 5-Expresses basic 90% of the time/requires cueing < 10% of the time.  Social Interaction Social Interaction: 5-Interacts appropriately 90% of the time - Needs monitoring or encouragement for participation or interaction.  Problem Solving Problem Solving: 3-Solves basic 50 - 74% of the time/requires cueing 25 - 49% of the time  Memory Memory: 3-Recognizes or recalls 50 - 74% of the time/requires cueing 25 - 49% of the time   1. DVT Prophylaxis/Anticoagulation: Mechanical: Sequential compression devices, below knee Bilateral lower extremities. BLE doppler negative.  2. Pain Management: added tylenol scheduled for leg symptoms, likely muscular. Exam inconsistent.Kpad and sports creme ordered with positive result.  3.Mood: affect improving. Ego support by team  -ritalin trial helpful. 10mg . 4. Sphenoid wing meningioma with vision loss- left crani for debulking/resection of tumor.  5. Resection of tumor  with right frontal ICH- on keppra for seizure prophylaxis. -Normalize sleep cycle.  6. Respiratory failure requiring trach: trach out without issues. 7. Staph UTI: abx completed  -some recurrent sx.  Repeat ua culture 8. ABLA: Added iron supplement. Recheck hgb stable. 9. HTN: Off tenormin currenty as blood pressures occasionally low. Monitor with bid checks and resume if blood pressures remain elevated. 10. FEN-eating well but occasionally needs cues for iniation. 11. Hallucinations and delusions likely are a combination of cognitive impairments as well as visual deficits. She has awareness to some extent that they aren't reality. These are slowly improving. i have discussed with the patient and her husband as well. i don't see that she had any last night 12. Nasal congestion: treat symptomatically. Better today LOS (Days) 17 A FACE TO FACE EVALUATION WAS PERFORMED  Jamiyla Ishee T 07/24/2011, 7:25 AM

## 2011-07-25 MED ORDER — CIPROFLOXACIN HCL 250 MG PO TABS
250.0000 mg | ORAL_TABLET | Freq: Two times a day (BID) | ORAL | Status: DC
  Administered 2011-07-25 – 2011-07-28 (×6): 250 mg via ORAL
  Filled 2011-07-25 (×8): qty 1

## 2011-07-25 NOTE — Progress Notes (Signed)
Urine culture results called to dr, new orders recieved

## 2011-07-25 NOTE — Progress Notes (Signed)
Patient ID: [name], female   DOB: 08/16/1945, 66 y.o.   MRN: [id number] Patient ID: LANINA LARRANAGA, female   DOB: 08/14/1945, 66 y.o.   MRN: [id number]  Subjective/Complaints: Review of Systems  Constitutional: Positive for malaise/fatigue.  All other systems reviewed and are negative.   2/17- occasional hallucinations. Less bladder sx today.  Objective: Vital Signs: Blood pressure 145/80, pulse 82, temperature 98 F (36.7 C), temperature source Oral, resp. rate 18, height 5\' 3"  (1.6 m), weight 77.32 kg (170 lb 7.4 oz), SpO2 95.00%. No results found. No results found for this basename: WBC:2,HGB:2,HCT:2,PLT:2 in the last 72 hours No results found for this basename: NA:2,K:2,CL:2,CO2:2,GLUCOSE:2,BUN:2,CREATININE:2,CALCIUM:2 in the last 72 hours CBG (last 3)  No results found for this basename: GLUCAP:3 in the last 72 hours  Wt Readings from Last 3 Encounters:  07/21/11 77.32 kg (170 lb 7.4 oz)  07/07/11 77.7 kg (171 lb 4.8 oz)  07/07/11 77.7 kg (171 lb 4.8 oz)   BP Readings from Last 3 Encounters:  07/25/11 145/80  07/07/11 120/48  07/07/11 120/48   Physical Exam:  General appearance: distracted, no distress and slowed mentation Head: Normocephalic, without obvious abnormality, atraumatic Eyes: conjunctivae/corneas clear. PERRL, EOM's intact. Fundi benign. Ears: normal TM's and external ear canals both ears Nose: Nares normal. Septum midline. Mucosa normal. No drainage or sinus tenderness. less nasal sounding. Throat: lips, mucosa, and tongue normal; teeth and gums normal. Trach site closed.. Neck: no adenopathy, no carotid bruit, no JVD, supple, symmetrical, trachea midline and thyroid not enlarged, symmetric, no tenderness/mass/nodules Back: symmetric, no curvature. ROM normal. No CVA tenderness. Resp: clear to auscultation bilaterally Cardio: heart regular without murmur. GI: soft, non-tender; bowel sounds normal; no masses,  no organomegaly Extremities: extremities  normal, atraumatic, no cyanosis or edema Pulses: 2+ and symmetric Skin: Skin color, texture, turgor normal. No rashes or lesions Neurologic:very alert.  Still a little delusional about this weekend's drill. Knows she's in the hospital. Initiating nicely with breakfast Incision/Wound: skin clean. Musc: legs non edematous and really minimally tender to touch. Exam 2/17  Assessment/Plan: 1. Functional deficits secondary to meningioma with post op ICH which require 3+ hours per day of interdisciplinary therapy in a comprehensive inpatient.  rehab setting. Physiatrist is providing close team supervision and 24 hour management of active medical problems listed below. Physiatrist and rehab team continue to assess barriers to discharge/monitor patient progress toward functional and medical goals.   FIM: FIM - Bathing Bathing Steps Patient Completed: Right Arm;Left Arm;Abdomen;Front perineal area;Buttocks;Right upper leg;Right lower leg (including foot);Left upper leg;Left lower leg (including foot);Chest Bathing: 4: Steadying assist  FIM - Upper Body Dressing/Undressing Upper body dressing/undressing steps patient completed: Thread/unthread right bra strap;Thread/unthread left bra strap;Hook/unhook bra;Thread/unthread right sleeve of pullover shirt/dresss;Thread/unthread left sleeve of pullover shirt/dress;Put head through opening of pull over shirt/dress;Pull shirt over trunk Upper body dressing/undressing: 5: Set-up assist to: Obtain clothing/put away FIM - Lower Body Dressing/Undressing Lower body dressing/undressing steps patient completed: Pull underwear up/down;Pull pants up/down;Thread/unthread right underwear leg;Thread/unthread left underwear leg;Thread/unthread right pants leg;Thread/unthread left pants leg;Don/Doff right sock;Don/Doff left sock;Don/Doff right shoe;Don/Doff left shoe Lower body dressing/undressing: 5: Set-up assist to: Obtain clothing  FIM - Toileting Toileting steps  completed by patient: Performs perineal hygiene;Adjust clothing prior to toileting;Adjust clothing after toileting Toileting Assistive Devices: Grab bar or rail for support Toileting: 5: Supervision: Safety issues/verbal cues  FIM - Diplomatic Services operational officer Devices: Art gallery manager Transfers: 5-To toilet/BSC: Supervision (verbal cues/safety issues)  FIM - Psychologist, occupational  Bed/Chair Transfer Assistive Devices: Therapist, occupational: 5: Supine > Sit: Supervision (verbal cues/safety issues);5: Bed > Chair or W/C: Supervision (verbal cues/safety issues);5: Chair or W/C > Bed: Supervision (verbal cues/safety issues)  FIM - Locomotion: Wheelchair Locomotion: Wheelchair: 0: Activity did not occur FIM - Locomotion: Ambulation Locomotion: Ambulation Assistive Devices: Designer, industrial/product Ambulation/Gait Assistance: 4: Min assist Locomotion: Ambulation: 4: Travels 150 ft or more with minimal assistance (Pt.>75%)  Comprehension Comprehension Mode: Auditory Comprehension: 5-Understands basic 90% of the time/requires cueing < 10% of the time  Expression Expression Mode: Verbal Expression: 5-Expresses basic 90% of the time/requires cueing < 10% of the time.  Social Interaction Social Interaction: 4-Interacts appropriately 75 - 89% of the time - Needs redirection for appropriate language or to initiate interaction.  Problem Solving Problem Solving: 3-Solves basic 50 - 74% of the time/requires cueing 25 - 49% of the time  Memory Memory: 3-Recognizes or recalls 50 - 74% of the time/requires cueing 25 - 49% of the time   1. DVT Prophylaxis/Anticoagulation: Mechanical: Sequential compression devices, below knee Bilateral lower extremities. BLE doppler negative.  2. Pain Management: added tylenol scheduled for leg symptoms, likely muscular. Exam inconsistent.Kpad and sports creme ordered with positive result.  3.Mood: affect improving. Ego support by team  -ritalin trial  helpful. 10mg . 4. Sphenoid wing meningioma with vision loss- left crani for debulking/resection of tumor.  5. Resection of tumor with right frontal ICH- on keppra for seizure prophylaxis. -Normalize sleep cycle.  6. Respiratory failure requiring trach: trach out without issues. 7. Staph UTI: abx completed  -some recurrent sx.  Repeat ua negative. Culture pending 8. ABLA: Added iron supplement. Recheck hgb stable. 9. HTN: Off tenormin currenty as blood pressures occasionally low. Monitor with bid checks and resume if blood pressures remain elevated. 10. FEN-eating well but occasionally needs cues for iniation. 11. Hallucinations and delusions likely are a combination of cognitive impairments as well as visual deficits. She has awareness to some extent that they aren't reality. These are slowly improving. i have discussed with the patient and her husband as well. i don't see that she had any last night 12. Nasal congestion: treat symptomatically. Better today LOS (Days) 18 A FACE TO FACE EVALUATION WAS PERFORMED  Catori Panozzo T 07/25/2011, 7:17 AM

## 2011-07-25 NOTE — Progress Notes (Signed)
Physical Therapy Note  Patient Details  Name: Jacqueline Christian MRN: [id number] Date of Birth: January 21, 1946 Today's Date: 07/25/2011  1400-1455 (55 minutes) group Pain- no complaint of pain Treatment: Pt participated in PT gait group to improve ambulation safety/endurance with/without assistive device ; Pt ambulates 160 feet X 2 with RW close supervision with vcs to stay closer and not lean forward on AD; gait without AD 2 X 80 feet min/mod assist with tactile cues at shoulder to improve erect standing or ambulating holding ball in front to improve trunk extension.   Jimie Kuwahara,JIM 07/25/2011, 3:06 PM

## 2011-07-25 NOTE — Progress Notes (Signed)
Pt pleasant today, alert/oriented x 3,  Up with min assist with walker to BR, complains of pain to head see mar medicated with prn vicodin,

## 2011-07-25 NOTE — Progress Notes (Signed)
Occupational Therapy Session Note  Patient Details  Name: Jacqueline Christian MRN: [id number] Date of Birth: August 01, 1945  Today's Date: 07/24/11 Time:  - 1240 to1300 Time Calculation=20 minutes   Skilled Therapeutic Interventions/Progress Updates: refused group therapy and c/o headache and fear of falling; however, she elected to complete bed mobility (Supervision); bed to recliner transfer= Close S;     Pain:no complaints of   See FIM for current functional status  Therapy/Group: Individual Therapy  Rozelle Logan 07/25/2011, 1:56 PM late entry for 07/24/11

## 2011-07-26 ENCOUNTER — Encounter: Payer: Self-pay | Admitting: Psychology

## 2011-07-26 LAB — URINE CULTURE: Culture  Setup Time: 201302161317

## 2011-07-26 NOTE — Progress Notes (Signed)
Speech Language Pathology Daily Session Note  Patient Details  Name: Jacqueline Christian MRN: [id number] Date of Birth: 30-Nov-1945  Today's Date: 07/26/2011 Time: 4098-1191 Time Calculation (min): 45 min  Short Term Goals:  SLP Short Term Goal 1 (Week 3): Pt will sustain attention to a functional task for ~5 minutes with Min A verbal and visual cues.  SLP Short Term Goal 2 (Week 3): Pt will demonstrate initiation with functional and familiar tasks with Min A verbal and visual cues. SLP Short Term Goal 3 (Week 3): Pt will demonstrate functional problem solving for basic and familiar tasks with Min A verbal and visual cues.   Skilled Therapeutic Interventions: Treatment focus on functional problem solving, sustained attention and initiation of functional tasks. Supervision verbal cue needed to initiate meal. Pt demonstrated divided attention between conversation and meal with Min A verbal cues for redirection for ~20 minutes. Pt demonstrating anticipatory awareness and reported she was nervous about going on the outing today because she did not want to fall. Pt recalled events of weekend with supervision question cues.    Daily Session FIM:  Comprehension Comprehension Mode: Auditory Comprehension: 5-Understands basic 90% of the time/requires cueing < 10% of the time Expression Expression Mode: Verbal Expression: 5-Expresses basic 90% of the time/requires cueing < 10% of the time. Social Interaction Social Interaction: 5-Interacts appropriately 90% of the time - Needs monitoring or encouragement for participation or interaction. Problem Solving Problem Solving: 4-Solves basic 75 - 89% of the time/requires cueing 10 - 24% of the time Memory Memory: 4-Recognizes or recalls 75 - 89% of the time/requires cueing 10 - 24% of the time  Pain Pain Assessment Pain Assessment: No/denies pain  Therapy/Group: Individual Therapy  Jerusalen Mateja 07/26/2011, 9:34 AM

## 2011-07-26 NOTE — Progress Notes (Signed)
Patient Details  Name: Jacqueline Christian MRN: [id number] Date of Birth: [birth year]  Today's Date: 07/26/2011 Time: 1250-1430 Skilled Therapeutic Interventions/Progress Updates: Pt participated in community reintegration/outoing to [employer] for lunch ambulatory level using rollator walker. See Outing goal sheet in paper chart for full details.  No c/o pain. Therapy/Group: Community Reintegration  Activity Level: Moderate:   Level of assist: Supervision- mobility & Min assist-cognition  Shaya Altamura 07/26/2011, 3:42 PM

## 2011-07-26 NOTE — Progress Notes (Signed)
Note reviewed and accurately reflects treatment session.   

## 2011-07-26 NOTE — Progress Notes (Signed)
Physical Therapy Note  Patient Details  Name: MARIS ABASCAL MRN: [id number] Date of Birth: [birth year] Today's Date: 07/26/2011  12:40-14:40 outting in community pt  Did not complain of pain.  Pt participated in a lunch outting to [employer] caf[employer]ia focusing on gait incommunity with rollator walker at supervision level, iniitiating eating meals and learning about energy conservation. Pt met all of her goals. See paper chart for goal sheet.  [name] 07/26/2011, 2:54 PM

## 2011-07-26 NOTE — Progress Notes (Signed)
Psychology Consultation-Follow-up  Name: Jacqueline Christian MRN: [id number]  66 year-old woman with a history of hypertension initially admitted with dizziness and visual disturbance. 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 Underwent a microdissection/debulking of hemangioma on 06/21/11 and required an emergent hematoma decompression for a postoperative subdural hematoma. She was admitted to [facility] on 06/3011. She has reported visual hallucinations of a snake as well as delusional ideas. She has been on a Ritalin trial since 07/12/11 to enhance attention and arousal. She was initially evaluated on 07/21/11 with impression of an acute increase in anxiety and dysphoria coupled with associated mild paranoia triggered by her misunderstanding of a "shooter drill" as well as by ongoing visual hallucinations of a snake.  Ms. Goyne appeared at ease and in good spirits. Her attentional skills and memory for ongoing events appeared improved compared to when she was initially seen. She expressed positive statements about her recovery to date and her future. She denied symptoms of depression or anxiety. She reported that visual hallucinations ceased sometime around 07/22/11. She did not express any delusional or confabulatory ideas. She is looking forward to discharge home in two days. Husband and sister, both of whom are supportive, will be available to assist.    No further psychological services are recommended at this time. She was advised to speak to her physician should she experience symptoms of depression in the future. She agreed.    Gladstone Pih, Ph.D 07/26/2011, Time: 1300

## 2011-07-26 NOTE — Progress Notes (Signed)
Occupational Therapy Session Note  Patient Details  Name: Jacqueline Christian MRN: [id number] Date of Birth: 02/11/1946  Today's Date: 07/26/2011 Time: 0700-0800 Time Calculation (min): 60 min   Skilled Therapeutic Interventions/Progress Updates:  ADL retraining in room. Pt in bathroom, with husbands assistance, when entering room. Pt ambulated with RW to shower (supervision). Bathed 10/10 parts sit/stand supervision. Ambulated to EOB for dressing. Shower and dressing required decreased verbal cues for initiation. After dressing pt initiated ambulating to sink and stood at sink for grooming. After grooming pt participated in homemaking task (making bed) but c/o "feeling shaky". Pt displaying decreased need for cues for initiation of "normal" functional tasks.  Focus on activity tolerance, initiation of tasks, and functional ambulation   Pain: Pain Assessment: none stated  See FIM for current functional status  Therapy/Group: Individual Therapy  Loleta Chance, OTAS 07/26/2011, 11:31 AM

## 2011-07-26 NOTE — Progress Notes (Signed)
Subjective/Complaints: Review of Systems  Constitutional: Positive for malaise/fatigue.  All other systems reviewed and are negative.   2/18--good night. No problems. Very bright this am.  Objective: Vital Signs: Blood pressure 139/75, pulse 92, temperature 98.3 F (36.8 C), temperature source Oral, resp. rate 16, height 5\' 3"  (1.6 m), weight 77.32 kg (170 lb 7.4 oz), SpO2 95.00%. No results found. No results found for this basename: WBC:2,HGB:2,HCT:2,PLT:2 in the last 72 hours No results found for this basename: NA:2,K:2,CL:2,CO2:2,GLUCOSE:2,BUN:2,CREATININE:2,CALCIUM:2 in the last 72 hours CBG (last 3)  No results found for this basename: GLUCAP:3 in the last 72 hours  Wt Readings from Last 3 Encounters:  07/21/11 77.32 kg (170 lb 7.4 oz)  07/07/11 77.7 kg (171 lb 4.8 oz)  07/07/11 77.7 kg (171 lb 4.8 oz)   BP Readings from Last 3 Encounters:  07/26/11 139/75  07/07/11 120/48  07/07/11 120/48   Physical Exam:  General appearance: distracted, no distress and slowed mentation Head: Normocephalic, without obvious abnormality, atraumatic Eyes: conjunctivae/corneas clear. PERRL, EOM's intact. Fundi benign. Ears: normal TM's and external ear canals both ears Nose: Nares normal. Septum midline. Mucosa normal. No drainage or sinus tenderness. less nasal sounding. Throat: lips, mucosa, and tongue normal; teeth and gums normal. Trach site closed.. Neck: no adenopathy, no carotid bruit, no JVD, supple, symmetrical, trachea midline and thyroid not enlarged, symmetric, no tenderness/mass/nodules Back: symmetric, no curvature. ROM normal. No CVA tenderness. Resp: clear to auscultation bilaterally Cardio: heart regular without murmur. GI: soft, non-tender; bowel sounds normal; no masses,  no organomegaly Extremities: extremities normal, atraumatic, no cyanosis or edema Pulses: 2+ and symmetric Skin: Skin color, texture, turgor normal. No rashes or lesions Neurologic:very alert. Good  conversation. Voice inflections better. Joking and kidding around.   Initiating nicely with breakfast. Moves all 4. Incision/Wound: skin clean. Musc: legs non edematous and really minimally tender to touch. Exam 2/18  Assessment/Plan: 1. Functional deficits secondary to meningioma with post op ICH which require 3+ hours per day of interdisciplinary therapy in a comprehensive inpatient.  rehab setting. Physiatrist is providing close team supervision and 24 hour management of active medical problems listed below. Physiatrist and rehab team continue to assess barriers to discharge/monitor patient progress toward functional and medical goals.   FIM: FIM - Bathing Bathing Steps Patient Completed: Right Arm;Left Arm;Abdomen;Front perineal area;Buttocks;Right upper leg;Right lower leg (including foot);Left upper leg;Left lower leg (including foot);Chest Bathing: 4: Steadying assist  FIM - Upper Body Dressing/Undressing Upper body dressing/undressing steps patient completed: Thread/unthread right bra strap;Thread/unthread left bra strap;Hook/unhook bra;Thread/unthread right sleeve of pullover shirt/dresss;Thread/unthread left sleeve of pullover shirt/dress;Put head through opening of pull over shirt/dress;Pull shirt over trunk Upper body dressing/undressing: 5: Set-up assist to: Obtain clothing/put away FIM - Lower Body Dressing/Undressing Lower body dressing/undressing steps patient completed: Pull underwear up/down;Pull pants up/down;Thread/unthread right underwear leg;Thread/unthread left underwear leg;Thread/unthread right pants leg;Thread/unthread left pants leg;Don/Doff right sock;Don/Doff left sock;Don/Doff right shoe;Don/Doff left shoe Lower body dressing/undressing: 5: Set-up assist to: Obtain clothing  FIM - Toileting Toileting steps completed by patient: Adjust clothing prior to toileting;Performs perineal hygiene;Adjust clothing after toileting Toileting Assistive Devices: Grab bar or  rail for support Toileting: 5: Supervision: Safety issues/verbal cues  FIM - Diplomatic Services operational officer Devices: Art gallery manager Transfers: 5-From toilet/BSC: Supervision (verbal cues/safety issues)  FIM - Banker Devices: Therapist, occupational: 5: Supine > Sit: Supervision (verbal cues/safety issues);5: Bed > Chair or W/C: Supervision (verbal cues/safety issues);5: Chair or W/C > Bed: Supervision (verbal  cues/safety issues)  FIM - Locomotion: Wheelchair Locomotion: Wheelchair: 0: Activity did not occur FIM - Locomotion: Ambulation Locomotion: Ambulation Assistive Devices: Designer, industrial/product Ambulation/Gait Assistance: 5: Supervision Locomotion: Ambulation: 5: Travels 150 ft or more with supervision/safety issues  Comprehension Comprehension Mode: Auditory Comprehension: 5-Understands complex 90% of the time/Cues < 10% of the time  Expression Expression Mode: Verbal Expression: 5-Expresses complex 90% of the time/cues < 10% of the time  Social Interaction Social Interaction: 4-Interacts appropriately 75 - 89% of the time - Needs redirection for appropriate language or to initiate interaction.  Problem Solving Problem Solving: 3-Solves basic 50 - 74% of the time/requires cueing 25 - 49% of the time  Memory Memory: 3-Recognizes or recalls 50 - 74% of the time/requires cueing 25 - 49% of the time   1. DVT Prophylaxis/Anticoagulation: Mechanical: Sequential compression devices, below knee Bilateral lower extremities. BLE doppler negative.  2. Pain Management: added tylenol scheduled for leg symptoms, likely muscular. Exam inconsistent.Kpad and sports creme ordered with positive result.  3.Mood: affect improving. Ego support by team  -ritalin trial helpful. 10mg . 4. Sphenoid wing meningioma with vision loss- left crani for debulking/resection of tumor.  5. Resection of tumor with right frontal ICH- on keppra for seizure  prophylaxis. -Normalize sleep cycle.  6. Respiratory failure requiring trach: trach out without issues. 7. E coli UTI- cipro empirically.  8. ABLA: Added iron supplement. Recheck hgb stable. 9. HTN: Off tenormin currenty as blood pressures occasionally low. Monitor with bid checks and resume if blood pressures remain elevated. 10. FEN-eating well but occasionally needs cues for iniation. 11. Hallucinations and delusions likely are a combination of cognitive impairments as well as visual deficits. She has awareness to some extent that they aren't reality. These are slowly improving. i have discussed with the patient and her husband as well. i don't see that she had any last night 12. Nasal congestion: treat symptomatically. Better. LOS (Days) 19 A FACE TO FACE EVALUATION WAS PERFORMED  Ayren Zumbro T 07/26/2011, 8:12 AM

## 2011-07-27 MED ORDER — MUSCLE RUB 10-15 % EX CREA: 1.0000 "application " | TOPICAL_CREAM | Freq: Two times a day (BID) | CUTANEOUS | Status: DC

## 2011-07-27 MED ORDER — METHYLPHENIDATE HCL 10 MG PO TABS: ORAL_TABLET | ORAL | Status: DC

## 2011-07-27 MED ORDER — LEVETIRACETAM 500 MG PO TABS: 500.0000 mg | ORAL_TABLET | Freq: Two times a day (BID) | ORAL | Status: DC

## 2011-07-27 MED ORDER — METRONIDAZOLE 500 MG PO TABS
500.0000 mg | ORAL_TABLET | Freq: Three times a day (TID) | ORAL | Status: DC
  Administered 2011-07-27 – 2011-07-28 (×3): 500 mg via ORAL
  Filled 2011-07-27 (×6): qty 1

## 2011-07-27 MED ORDER — LEVETIRACETAM 500 MG PO TABS
500.0000 mg | ORAL_TABLET | Freq: Two times a day (BID) | ORAL | Status: DC
  Administered 2011-07-27 – 2011-07-28 (×2): 500 mg via ORAL
  Filled 2011-07-27 (×4): qty 1

## 2011-07-27 MED ORDER — FAMOTIDINE 20 MG PO TABS: 20.0000 mg | ORAL_TABLET | Freq: Two times a day (BID) | ORAL | Status: DC

## 2011-07-27 MED ORDER — FERROUS SULFATE 325 (65 FE) MG PO TABS: 325.0000 mg | ORAL_TABLET | Freq: Two times a day (BID) | ORAL | Status: DC

## 2011-07-27 MED ORDER — CIPROFLOXACIN HCL 250 MG PO TABS: 250.0000 mg | ORAL_TABLET | Freq: Two times a day (BID) | ORAL | Status: AC

## 2011-07-27 NOTE — Progress Notes (Signed)
Therapeutic Recreation Discharge Summary Patient Details  Name: Jacqueline Christian MRN: [id number] Date of Birth: 03-29-1946  Long term goals set: 1  Long term goals met: 1  Comments on progress toward goals: Pt has made great progress toward LTG and is ready for discharge home with family to provide 24 hour supervision.  Pt continues to be limited by decreased cognition/awareness requiring verbal cuing for safety.  Pt at overall Min assist level for community pursuits due to cognition.  Reasons goals not met: n/a  Equipment acquired: n/a  Reasons for discharge: discharge from hospital  Patient/family agrees with progress made and goals achieved: Yes  Levetta Bognar 07/27/2011, 3:38 PM

## 2011-07-27 NOTE — Progress Notes (Signed)
Physical Therapy Discharge Summary  Patient Details  Name: Jacqueline Christian MRN: [id number] Date of Birth: [birth year] Today's Date: 07/27/2011  Session #1 9:30-10:15 45 minutes individual therapy; pt complained of 8/10 pain in head. Vincente Liberty, PTA  Session #2 13:00- 14:00 60 minutes group therapy; pt did not complain of pain. Session focused on dynamic balance and standing endurance to play just dance on the WII. Pt supervision for all tasks with rollator walker. Vincente Liberty, PTA  Patient has met 11 of 11 long term goals due to improved activity tolerance, improved balance, increased strength, improved attention and improved awareness.  Patient to discharge at an ambulatory level Supervision.   Patient's care partner is independent to provide the necessary supervision for physical and cognitve impairments  at discharge.   Recommendation:  Patient will benefit from ongoing skilled PT services in outpatient setting to continue to advance safe functional mobility, address ongoing impairments in muscle strength, balance, activity tolerance,safety awareness and minimize fall risk.  Equipment: rollator walker  Reasons for discharge: treatment goals met and discharge from hospital  Patient/family agrees with progress made and goals achieved: Yes  PT Discharge Precautions/Restrictions Precautions Required Braces or Orthoses: No Restrictions Weight Bearing Restrictions: No Vital Signs   Pain Pain Assessment Pain Score:   8 Pain Location: Head Vision/Perception  Vision - History Baseline Vision: Wears glasses only for reading Patient Visual Report: Blurring of vision;Diplopia Vision - Assessment Eye Alignment: Within Functional Limits Perception Perception: Within Functional Limits Inattention/Neglect: Appears intact  Cognition   Sensation Sensation Light Touch: Appears Intact Stereognosis: Appears Intact Hot/Cold: Appears Intact Proprioception: Appears  Intact Coordination Gross Motor Movements are Fluid and Coordinated: Yes Fine Motor Movements are Fluid and Coordinated: Yes Motor  Motor Motor: Hemiplegia Motor - Discharge Observations: mild left weakness in leg impacting balance and decreased endurance  Mobility Bed Mobility Bed Mobility: Yes Supine to Sit: 6: Modified independent (Device/Increase time);7: Independent Sitting - Scoot to Edge of Bed: 6: Modified independent (Device/Increase time);7: Independent Sit to Supine: 7: Independent Transfers Sit to Stand: 5: Supervision Stand to Sit: 5: Supervision Stand Pivot Transfers: 5: Supervision Locomotion  Ambulation Ambulation: Yes Ambulation/Gait Assistance: 5: Supervision Ambulation Distance (Feet): 150 Feet Assistive device: Rollator Ambulation/Gait Assistance Details: Verbal cues for precautions/safety Ambulation/Gait Assistance Details (indicate cue type and reason): decreaqsed attention to environment and internal disrtaction by fatigue Gait Gait: Yes Gait Pattern: Within Functional Limits Stairs / Additional Locomotion Stairs: Yes Stairs Assistance: 5: Supervision Stair Management Technique: Two rails Number of Stairs: 12  Ramp: 5: Supervision Curb: 5: Supervision Wheelchair Mobility Wheelchair Mobility: No  Trunk/Postural Assessment  Cervical Assessment Cervical Assessment: Within Functional Limits Thoracic Assessment Thoracic Assessment: Within Functional Limits Lumbar Assessment Lumbar Assessment: Within Functional Limits Postural Control Postural Control: Within Functional Limits  Balance Balance Balance Assessed: Yes Standardized Balance Assessment Standardized Balance Assessment: Berg Balance Test Berg Balance Test Sit to Stand: Able to stand  independently using hands Standing Unsupported: Able to stand safely 2 minutes Sitting with Back Unsupported but Feet Supported on Floor or Stool: Able to sit safely and securely 2 minutes Stand to Sit:  Controls descent by using hands Transfers: Able to transfer safely, definite need of hands Standing Unsupported with Eyes Closed: Unable to keep eyes closed 3 seconds but stays steady Standing Ubsupported with Feet Together: Able to place feet together independently and stand 1 minute safely From Standing, Reach Forward with Outstretched Arm: Can reach forward >5 cm safely (2") From Standing Position, Pick up Object  from Floor: Able to pick up shoe, needs supervision From Standing Position, Turn to Look Behind Over each Shoulder: Turn sideways only but maintains balance Turn 360 Degrees: Needs close supervision or verbal cueing Standing Unsupported, Alternately Place Feet on Step/Stool: Able to complete >2 steps/needs minimal assist Standing Unsupported, One Foot in Front: Able to take small step independently and hold 30 seconds Standing on One Leg: Unable to try or needs assist to prevent fall Total Score: 33  Static Sitting Balance Static Sitting - Balance Support: Feet supported Static Sitting - Level of Assistance: 6: Modified independent (Device/Increase time) Extremity Assessment  RUE Assessment RUE Assessment: Within Functional Limits LUE Assessment LUE Assessment: Within Functional Limits LUE Strength Left Shoulder Flexion: 4/5 RLE Assessment RLE Assessment: Within Functional Limits RLE Strength RLE Overall Strength: Within Functional Limits for tasks assessed RLE Overall Strength Comments: grossly 4/5 LLE Assessment LLE Assessment: Exceptions to WFL LLE AROM (degrees) Overall AROM Left Lower Extremity: Within functional limits for tasks assessed LLE Strength LLE Overall Strength: Deficits LLE Overall Strength Comments: grossly 3+/5  See FIM for current functional status  Jacqueline Christian 07/27/2011, 10:11 AM

## 2011-07-27 NOTE — Progress Notes (Signed)
Nutrition Follow-up  Intake continues to improve. Pt now eating 75-100% of meals. Continues to take supplements.  Diet Order:  Regular Supplements: Ensure Clinical Strength BID and Ensure Pudding BID  Meds: Scheduled Meds:   . acetaminophen  500 mg Oral TID  . antiseptic oral rinse  15 mL Mouth Rinse QID  . azelastine  2 spray Each Nare BID  . chlorhexidine  15 mL Mouth Rinse BID  . ciprofloxacin  250 mg Oral BID  . famotidine  20 mg Oral BID  . feeding supplement  237 mL Oral BID  . feeding supplement  1 Container Oral BID WC  . ferrous sulfate  325 mg Oral BID  . levETIRAcetam  500 mg Per Tube BID  . loratadine  10 mg Oral Daily  . methylphenidate  10 mg Oral BID  . Muscle Rub   Topical BID  . naphazoline-glycerin  1-2 drop Both Eyes QID  . nystatin   Topical TID  . pantoprazole  40 mg Oral Q1200   Continuous Infusions:  PRN Meds:.acetaminophen, alum & mag hydroxide-simeth, bisacodyl, diphenhydrAMINE, guaiFENesin-dextromethorphan, hydrocerin, HYDROcodone-acetaminophen, methocarbamol, polyethylene glycol, polyethylene glycol, promethazine, promethazine, promethazine, traZODone  Labs:  CMP     Component Value Date/Time   NA 141 07/08/2011 0625   K 3.8 07/08/2011 0625   CL 103 07/08/2011 0625   CO2 27 07/08/2011 0625   GLUCOSE 103* 07/08/2011 0625   BUN 13 07/08/2011 0625   CREATININE 0.73 07/08/2011 0625   CALCIUM 9.9 07/08/2011 0625   PROT 7.0 07/08/2011 0625   ALBUMIN 3.2* 07/08/2011 0625   AST 18 07/08/2011 0625   ALT 37* 07/08/2011 0625   ALKPHOS 102 07/08/2011 0625   BILITOT 0.4 07/08/2011 0625   GFRNONAA 87* 07/08/2011 0625   GFRAA >90 07/08/2011 0625     Intake/Output Summary (Last 24 hours) at 07/27/11 1139 Last data filed at 07/26/11 1900  Gross per 24 hour  Intake    240 ml  Output      0 ml  Net    240 ml    Weight Status:  76.3 kg, wt stable  Estimated needs:  1550 - 1700 kcal, 78 - 90 grams protein  Nutrition Dx:  Increased energy expenditure -  persists.  Goal:  Pt to consume >/= 75% of meals and supplements. Met.  Intervention:  Continue current interventions.  Monitor:  Weights, labs, PO intake, I/O's  Adair Laundry Pager #:  [phone]

## 2011-07-27 NOTE — Progress Notes (Signed)
Patient ID: Jacqueline Christian, female   DOB: 12/16/1945, 66 y.o.   MRN: [id number]   Subjective/Complaints: Review of Systems  Constitutional: Positive for malaise/fatigue.  All other systems reviewed and are negative.   2/19--good night. No problems. Anxious to go home  Objective: Vital Signs: Blood pressure 132/78, pulse 80, temperature 98.1 F (36.7 C), temperature source Oral, resp. rate 20, height 5\' 3"  (1.6 m), weight 76.25 kg (168 lb 1.6 oz), SpO2 95.00%. No results found. No results found for this basename: WBC:2,HGB:2,HCT:2,PLT:2 in the last 72 hours No results found for this basename: NA:2,K:2,CL:2,CO2:2,GLUCOSE:2,BUN:2,CREATININE:2,CALCIUM:2 in the last 72 hours CBG (last 3)  No results found for this basename: GLUCAP:3 in the last 72 hours  Wt Readings from Last 3 Encounters:  07/27/11 76.25 kg (168 lb 1.6 oz)  07/07/11 77.7 kg (171 lb 4.8 oz)  07/07/11 77.7 kg (171 lb 4.8 oz)   BP Readings from Last 3 Encounters:  07/27/11 132/78  07/07/11 120/48  07/07/11 120/48   Physical Exam:  General appearance: distracted, no distress and slowed mentation Head: Normocephalic, without obvious abnormality, atraumatic Eyes: conjunctivae/corneas clear. PERRL, EOM's intact. Fundi benign. Ears: normal TM's and external ear canals both ears Nose: Nares normal. Septum midline. Mucosa normal. No drainage or sinus tenderness. less nasal sounding. Throat: lips, mucosa, and tongue normal; teeth and gums normal. Trach site closed.. Neck: no adenopathy, no carotid bruit, no JVD, supple, symmetrical, trachea midline and thyroid not enlarged, symmetric, no tenderness/mass/nodules Back: symmetric, no curvature. ROM normal. No CVA tenderness. Resp: clear to auscultation bilaterally Cardio: heart regular without murmur. GI: soft, non-tender; bowel sounds normal; no masses,  no organomegaly Extremities: extremities normal, atraumatic, no cyanosis or edema Pulses: 2+ and symmetric Skin: Skin  color, texture, turgor normal. No rashes or lesions Neurologic:very alert. Good conversation. Voice inflections better. Joking and kidding around.   Initiating nicely with breakfast. Moves all 4.equally now. fmc a little less right arm Incision/Wound: skin clean. Musc: legs non edematous and really minimally tender to touch. Exam 2/19  Assessment/Plan: 1. Functional deficits secondary to meningioma with post op ICH which require 3+ hours per day of interdisciplinary therapy in a comprehensive inpatient.  rehab setting. Physiatrist is providing close team supervision and 24 hour management of active medical problems listed below. Physiatrist and rehab team continue to assess barriers to discharge/monitor patient progress toward functional and medical goals.   FIM: FIM - Bathing Bathing Steps Patient Completed: Chest;Right Arm;Left Arm;Abdomen;Front perineal area;Buttocks;Right upper leg;Left upper leg;Right lower leg (including foot);Left lower leg (including foot) Bathing: 5: Supervision: Safety issues/verbal cues  FIM - Upper Body Dressing/Undressing Upper body dressing/undressing steps patient completed: Thread/unthread right bra strap;Thread/unthread left bra strap;Hook/unhook bra;Thread/unthread right sleeve of pullover shirt/dresss;Thread/unthread left sleeve of pullover shirt/dress;Put head through opening of pull over shirt/dress;Pull shirt over trunk Upper body dressing/undressing: 5: Supervision: Safety issues/verbal cues FIM - Lower Body Dressing/Undressing Lower body dressing/undressing steps patient completed: Thread/unthread right underwear leg;Thread/unthread left underwear leg;Pull underwear up/down;Thread/unthread right pants leg;Thread/unthread left pants leg;Pull pants up/down;Fasten/unfasten pants;Don/Doff right sock;Don/Doff left sock;Don/Doff right shoe;Don/Doff left shoe;Fasten/unfasten right shoe;Fasten/unfasten left shoe Lower body dressing/undressing: 5: Set-up assist  to: Obtain clothing  FIM - Toileting Toileting steps completed by patient: Adjust clothing prior to toileting;Performs perineal hygiene;Adjust clothing after toileting Toileting Assistive Devices: Grab bar or rail for support Toileting: 5: Supervision: Safety issues/verbal cues  FIM - Diplomatic Services operational officer Devices: Art gallery manager Transfers: 5-From toilet/BSC: Supervision (verbal cues/safety issues)  FIM - Press photographer  Assistive Devices: Therapist, occupational: 5: Supine > Sit: Supervision (verbal cues/safety issues)  FIM - Locomotion: Wheelchair Locomotion: Wheelchair: 0: Activity did not occur FIM - Locomotion: Ambulation Locomotion: Ambulation Assistive Devices: Designer, industrial/product Ambulation/Gait Assistance: 5: Supervision Locomotion: Ambulation: 5: Travels 150 ft or more with supervision/safety issues  Comprehension Comprehension Mode: Auditory Comprehension: 5-Understands basic 90% of the time/requires cueing < 10% of the time  Expression Expression Mode: Verbal Expression: 5-Expresses basic 90% of the time/requires cueing < 10% of the time.  Social Interaction Social Interaction: 4-Interacts appropriately 75 - 89% of the time - Needs redirection for appropriate language or to initiate interaction.  Problem Solving Problem Solving: 3-Solves basic 50 - 74% of the time/requires cueing 25 - 49% of the time  Memory Memory: 3-Recognizes or recalls 50 - 74% of the time/requires cueing 25 - 49% of the time   1. DVT Prophylaxis/Anticoagulation: Mechanical: Sequential compression devices, below knee Bilateral lower extremities. BLE doppler negative.  2. Pain Management: added tylenol scheduled for leg symptoms, likely muscular. Exam inconsistent.Kpad and sports creme ordered with positive result.  3.Mood: affect improving. Ego support by team  -ritalin trial helpful. 10mg . Wean once home 4. Sphenoid wing meningioma with vision  loss- left crani for debulking/resection of tumor.  5. Resection of tumor with right frontal ICH- on keppra for seizure prophylaxis. -Normalize sleep cycle.  6. Respiratory failure requiring trach: trach out without issues. 7. E coli UTI- cipro empirically.  8. ABLA: Added iron supplement. Recheck hgb stable. 9. HTN: Off tenormin currenty-could resume as an outpt as needed 10. FEN-eating well  11. Hallucinations and delusions likely are a combination of cognitive impairments as well as visual deficits. She has awareness to some extent that they aren't reality. These continue to fade. i have discussed with the patient and her husband as well.  12. Nasal congestion: resolved LOS (Days) 20 A FACE TO FACE EVALUATION WAS PERFORMED  Densel Kronick T 07/27/2011, 7:55 AM

## 2011-07-27 NOTE — Progress Notes (Signed)
Note reviewed and accurately reflects treatment session.   

## 2011-07-27 NOTE — Progress Notes (Signed)
Occupational Therapy Discharge Summary  Patient Details  Name: Jacqueline Christian MRN: [id number] Date of Birth: January 22, 1946 Today's Date: 07/27/2011  Patient has met 14 of 14 long term goals due to improved activity tolerance, initiation, overall personality has improved, improved balance, postural control, improved attention, improved awareness and improved coordination.  Pt is currently supervision for bathing, grooming, dressing, tub transfers, toilet transfers, and toileting. Patient still occasionally requires min cueing for initiation of tasks. Family education has been completed with pt's husband and sister who are independent with providing care for her. Patient to discharge at overall Supervision level.     Recommendation:  Patient does not need any further OT f/u   Equipment: No equipment provided (Pt already owns [facility], and husband will purchase shower seat in community)  Reasons for discharge: treatment goals met and discharge from hospital  Patient/family agrees with progress made and goals achieved: Yes  OT Discharge Precautions/Restrictions  Precautions Required Braces or Orthoses: No Restrictions Weight Bearing Restrictions: No   ADL ADL Eating: Set up Grooming: Supervision/safety Where Assessed-Grooming: Standing at sink Upper Body Bathing: Supervision/safety Where Assessed-Upper Body Bathing: Shower Lower Body Bathing: Supervision/safety Where Assessed-Lower Body Bathing: Shower Upper Body Dressing: Supervision/safety Where Assessed-Upper Body Dressing: Edge of bed Lower Body Dressing: Supervision/safety Where Assessed-Lower Body Dressing: Edge of bed Toileting: Supervision/safety Where Assessed-Toileting: Teacher, adult education: Close supervision Toilet Transfer Method: Insurance claims handler: Close supervison Film/video editor: Close supervision Film/video editor Method: Designer, industrial/product: Administrator Vision/Perception  Vision - History Baseline Vision: Wears glasses only for reading Patient Visual Report: Blurring of vision;Diplopia Vision - Assessment Eye Alignment: Within Chemical engineer Perception: Within Functional Limits Inattention/Neglect: Appears intact     Sensation Sensation Light Touch: Appears Intact Stereognosis: Appears Intact Hot/Cold: Appears Intact Proprioception: Appears Intact Coordination Gross Motor Movements are Fluid and Coordinated: Yes Fine Motor Movements are Fluid and Coordinated: Yes  Trunk/Postural Assessment  Cervical Assessment Cervical Assessment: Within Functional Limits Thoracic Assessment Thoracic Assessment: Within Functional Limits Lumbar Assessment Lumbar Assessment: Within Functional Limits Postural Control Postural Control: Within Functional Limits   Balance Static Sitting Balance Static Sitting - Balance Support: Feet supported Static Sitting - Level of Assistance: 6: Modified independent (Device/Increase time) Extremity/Trunk Assessment  RUE Assessment RUE Assessment: Within Functional Limits LUE Assessment LUE Assessment: Within Functional Limits LUE Strength Left Shoulder Flexion: 4/5  See FIM for current functional status  Loleta Chance, OTAS 07/27/2011, 11:10 AM

## 2011-07-27 NOTE — Progress Notes (Signed)
Occupational Therapy Session Note  Patient Details  Name: ADALEAH FORGET MRN: [id number] Date of Birth: 1946-01-05  Today's Date: 07/27/2011 Time: 0700-0800   Skilled Therapeutic Interventions/Progress Updates:  ADL retraining in room. Pt ambulated with RW to shower (supervision). Bathed 10/10 parts sit/stand supervision. Ambulated to EOB for dressing. Shower required min verbal cues for initiation and safety. No verbal cues were needed for dressing EOB. Pt displaying minimal need for cues for initiation of "normal" functional tasks. Focus on activity tolerance, initiation of tasks, and functional ambulation    Pain: None  See FIM for current functional status  Therapy/Group: Individual Therapy  Loleta Chance, OTAS 07/27/2011, 11:07 AM

## 2011-07-27 NOTE — Progress Notes (Signed)
Speech Language Pathology Daily Session Note & Discharge Summary  Patient Details  Name: Jacqueline Christian MRN: [id number] Date of Birth: May 31, 1946  Today's Date: 07/27/2011 Time: 1530-1600 Time Calculation (min): 30 min  Short Term Goals:  SLP Short Term Goal 1 (Week 3): Pt will sustain attention to a functional task for ~5 minutes with Min A verbal and visual cues.  SLP Short Term Goal 2 (Week 3): Pt will demonstrate initiation with functional and familiar tasks with Min A verbal and visual cues. SLP Short Term Goal 3 (Week 3): Pt will demonstrate functional problem solving for basic and familiar tasks with Min A verbal and visual cues.   Skilled Therapeutic Interventions: Treatment focus on anticipatory awareness for discharge home tomorrow. Pt independently verbalized that she will need more frequent rest breaks and fatigues more quickly, especially when out in the community. Pt will discharge home tomorrow with 24/7 supervision from family.   Daily Session FIM:  Comprehension Comprehension Mode: Auditory Comprehension: 5-Understands basic 90% of the time/requires cueing < 10% of the time Expression Expression Mode: Verbal Expression: 5-Expresses basic needs/ideas: With extra time/assistive device Social Interaction Social Interaction: 5-Interacts appropriately 90% of the time - Needs monitoring or encouragement for participation or interaction. Problem Solving Problem Solving: 4-Solves basic 75 - 89% of the time/requires cueing 10 - 24% of the time Memory Memory: 4-Recognizes or recalls 75 - 89% of the time/requires cueing 10 - 24% of the time FIM - Eating Eating Activity: 5: Supervision/cues Pain: No/Denies Pain Cognition: Overall Cognitive Status: Impaired Arousal/Alertness: Awake/alert Orientation Level: Oriented X4 Attention: Selective Focused Attention: Appears intact Sustained Attention: Impaired Sustained Attention Impairment: Functional basic;Verbal basic Selective  Attention: Impaired Selective Attention Impairment: Verbal basic;Functional basic Memory: Impaired Memory Impairment: Decreased recall of new information Decreased Short Term Memory: Functional basic;Verbal basic Awareness: Impaired Awareness Impairment: Anticipatory impairment Problem Solving: Impaired Problem Solving Impairment: Functional basic;Functional complex;Verbal basic;Verbal complex Sequencing: Appears intact Organizing: Appears intact Initiating: Appears intact Safety/Judgment: Impaired Oral/Motor: Oral Motor/Sensory Function Overall Oral Motor/Sensory Function: Appears within functional limits for tasks assessed Labial ROM: Within Functional Limits Labial Symmetry: Within Functional Limits Labial Strength: Within Functional Limits Lingual ROM: Within Functional Limits Lingual Symmetry: Within Functional Limits Lingual Strength: Reduced Facial ROM: Within Functional Limits Facial Symmetry: Within Functional Limits Facial Strength: Within Functional Limits Facial Sensation: Within Functional Limits Velum: Within Functional Limits Mandible: Within Functional Limits Motor Speech Overall Motor Speech: Appears within functional limits for tasks assessed Respiration: Within functional limits Phonation: Normal Resonance: Within functional limits Articulation: Within functional limitis Intelligibility: Intelligible Motor Planning: Witnin functional limits Comprehension: Auditory Comprehension Overall Auditory Comprehension: Appears within functional limits for tasks assessed Yes/No Questions: Within Functional Limits Basic Biographical Questions: 76-100% accurate Basic Immediate Environment Questions: 75-100% accurate Commands: Within Functional Limits One Step Basic Commands: 75-100% accurate Two Step Basic Commands: 75-100% accurate Conversation: Simple Other Conversation Comments: Pt reposnded to ~50% of questions. Pt answered yes/no biorgraphical questions  correctly and verbalized the names of her children and husband. Deficits impacted by attention and iniation.  Interfering Components: Attention EffectiveTechniques: Extra processing time;Visual/Gestural cues Visual Recognition/Discrimination Discrimination: Within Function Limits Reading Comprehension Reading Status: Within funtional limits Expression: Expression Primary Mode of Expression: Verbal Verbal Expression Overall Verbal Expression: Appears within functional limits for tasks assessed Initiation: No impairment Automatic Speech: Social Response Level of Generative/Spontaneous Verbalization: Conversation Repetition: No impairment Naming: No impairment Pragmatics: Impairment Impairments: Topic maintenance;Turn Taking Interfering Components: Attention Effective Techniques: Open ended questions;Semantic cues Other Verbal Expression Comments: Pt  will initiate basic needs. "I need to go to the bathroom."  Written Expression Written Expression: Not tested  Therapy/Group: Individual Therapy  Ilah Boule 07/27/2011, 4:55 PM Speech Language Pathology Discharge Summary  Patient Details  Name: Jacqueline Christian MRN: [id number] Date of Birth: 1945-07-12 Today's Date: 07/27/2011  Patient has met 5 of 5 long term goals this admission.  Patient to discharge at overall Min Assist-Supervision level.  Patient's care partners are independent to provide the necessary cognitive assistance at discharge.  Reasons goals not met: N/A  Recommendation:  Patient will benefit from ongoing skilled SLP services in outpatient setting to continue to advance functional skills to maximize cognitive function and overall independence.  Equipment: N/A  Reasons for discharge: treatment goals met and discharge from hospital  Patient/family agrees with progress made and goals achieved: Yes  See FIM for current functional status  Mikyla Schachter 07/27/2011, 4:57 PM

## 2011-07-27 NOTE — Plan of Care (Signed)
Problem: Increased Energy Expenditure (NI-1.2) Goal: Food and/or nutrient delivery Individualized approach for food/nutrient provision.  Outcome: Completed/Met Date Met:  07/27/11 Pt is consuming 75-100% of meals. Taking supplements. Pt has met her nutrition goals.

## 2011-07-28 DIAGNOSIS — I619 Nontraumatic intracerebral hemorrhage, unspecified: Secondary | ICD-10-CM

## 2011-07-28 DIAGNOSIS — Z5189 Encounter for other specified aftercare: Secondary | ICD-10-CM

## 2011-07-28 DIAGNOSIS — C700 Malignant neoplasm of cerebral meninges: Secondary | ICD-10-CM

## 2011-07-28 LAB — CBC
HCT: 34.8 % — ABNORMAL LOW (ref 36.0–46.0)
Hemoglobin: 11.4 g/dL — ABNORMAL LOW (ref 12.0–15.0)
MCH: 29.5 pg (ref 26.0–34.0)
MCV: 90.2 fL (ref 78.0–100.0)
Platelets: 191 10*3/uL (ref 150–400)
RBC: 3.86 MIL/uL — ABNORMAL LOW (ref 3.87–5.11)
WBC: 10.7 10*3/uL — ABNORMAL HIGH (ref 4.0–10.5)

## 2011-07-28 LAB — DIFFERENTIAL
Eosinophils Absolute: 0.5 10*3/uL (ref 0.0–0.7)
Eosinophils Relative: 5 % (ref 0–5)
Lymphocytes Relative: 15 % (ref 12–46)
Lymphs Abs: 1.7 10*3/uL (ref 0.7–4.0)
Monocytes Absolute: 1 10*3/uL (ref 0.1–1.0)
Monocytes Relative: 9 % (ref 3–12)

## 2011-07-28 LAB — CLOSTRIDIUM DIFFICILE BY PCR: Toxigenic C. Difficile by PCR: NEGATIVE

## 2011-07-28 LAB — BASIC METABOLIC PANEL
CO2: 29 mEq/L (ref 19–32)
Calcium: 9.9 mg/dL (ref 8.4–10.5)
Chloride: 107 mEq/L (ref 96–112)
Glucose, Bld: 111 mg/dL — ABNORMAL HIGH (ref 70–99)
Sodium: 145 mEq/L (ref 135–145)

## 2011-07-28 MED ORDER — METRONIDAZOLE 500 MG PO TABS: 500.0000 mg | ORAL_TABLET | Freq: Three times a day (TID) | ORAL | Status: AC

## 2011-07-28 MED ORDER — POTASSIUM CHLORIDE CRYS ER 20 MEQ PO TBCR
40.0000 meq | EXTENDED_RELEASE_TABLET | Freq: Once | ORAL | Status: AC
  Administered 2011-07-28: 40 meq via ORAL
  Filled 2011-07-28: qty 2

## 2011-07-28 NOTE — Progress Notes (Signed)
Patient discharged to home with family at 1210. Discharge instructions given by D. Anguilli, PA. Hedy Camara

## 2011-07-28 NOTE — Patient Care Conference (Signed)
Inpatient RehabilitationTeam Conference Note Date: 07/27/2011   Time: 2:55 PM    Patient Name: Jacqueline Christian      Medical Record Number: [id number]  Date of Birth: Dec 07, 1945 Sex: Female         Room/Bed: 4028/4028-02 Payor Info: Payor: MEDICARE  Plan: MEDICARE PART A AND B  Product Type: *No Product type*     Admitting Diagnosis: Meningioma Resection, Crani for SDH  Admit Date/Time:  07/07/2011  2:34 PM Admission Comments: No comment available   Primary Diagnosis:  ICH (intracerebral hemorrhage) Principal Problem: ICH (intracerebral hemorrhage)  Patient Active Problem List  Diagnoses Date Noted  . ICH (intracerebral hemorrhage) 07/09/2011    Expected Discharge Date: Expected Discharge Date: 07/28/11  Team Members Present: Physician: Dr. Faith Rogue Case Manager Present: Melanee Spry, RN Social Worker Present: Amada Jupiter, LCSW Nurse Present: Laural Roes, RN;Angie Alona Bene, RN PT Present: Elvia Collum, PT OT Present: Mackie Pai, Marye Round, OT SLP Present: Feliberto Gottron, SLP Other (Discipline and Name): Tora Duck. PPS Coordinator     Current Status/Progress Goal Weekly Team Focus  Medical   no new issues, personality returning  see previous  prepare for dc   Bowel/Bladder   continent  uses call bell to communicate needs  timed toileting   Swallow/Nutrition/ Hydration   regular textures and thin liquids, supervision-Mod I  Supervision  D/C tomorrow   ADL's             Mobility             Communication   Supervision  Min A  D/C tomorrow   Safety/Cognition/ Behavioral Observations  Min A-Supervision  Min A  D/C tomorrow   Pain   c/o H?A  managed  assess for s/s of pain and offer prn's as needed   Skin   mild irritation to groins  resolved by d/c  Nystatin powder, checks for incontinence      *See Interdisciplinary Assessment and Plan and progress notes for long and short-term goals  Barriers to Discharge: none    Possible Resolutions to  Barriers:  none    Discharge Planning/Teaching Needs:  Home with husband and sister to provide 24/7 supervision - all educataion complete      Team Discussion: Pt went on therapy outing Monday & did well.  Family ed has been completed & pt is ready for d/c.   Revisions to Treatment Plan: none    Continued Need for Acute Rehabilitation Level of Care: The patient requires daily medical management by a physician with specialized training in physical medicine and rehabilitation for the following conditions: Daily direction of a multidisciplinary physical rehabilitation program to ensure safe treatment while eliciting the highest outcome that is of practical value to the patient.: Yes Daily medical management of patient stability for increased activity during participation in an intensive rehabilitation regime.: Yes Daily analysis of laboratory values and/or radiology reports with any subsequent need for medication adjustment of medical intervention for : Post surgical problems;Other;Neurological problems  Brock Ra 07/28/2011, 1:55 PM

## 2011-07-28 NOTE — Progress Notes (Signed)
Patient ID: Jacqueline Christian, female   DOB: June 24, 1945, 66 y.o.   MRN: [id number] Patient ID: Jacqueline Christian, female   DOB: 07-17-1945, 66 y.o.   MRN: [id number]   Subjective/Complaints: Review of Systems  Constitutional: Positive for malaise/fatigue.  All other systems reviewed and are negative.   2/20--some diarrhea yesterday.  More hallucinations last night.  Feels a little crampy today.  Objective: Vital Signs: Blood pressure 131/79, pulse 89, temperature 97.6 F (36.4 C), temperature source Oral, resp. rate 17, height 5\' 3"  (1.6 m), weight 76.25 kg (168 lb 1.6 oz), SpO2 94.00%. No results found.  Basename 07/28/11 0526  WBC 10.7*  HGB 11.4*  HCT 34.8*  PLT 191    Basename 07/28/11 0526  NA 145  K 3.0*  CL 107  CO2 29  GLUCOSE 111*  BUN 11  CREATININE 0.67  CALCIUM 9.9   CBG (last 3)  No results found for this basename: GLUCAP:3 in the last 72 hours  Wt Readings from Last 3 Encounters:  07/27/11 76.25 kg (168 lb 1.6 oz)  07/07/11 77.7 kg (171 lb 4.8 oz)  07/07/11 77.7 kg (171 lb 4.8 oz)   BP Readings from Last 3 Encounters:  07/28/11 131/79  07/07/11 120/48  07/07/11 120/48   Physical Exam:  General appearance: distracted, no distress and slowed mentation Head: Normocephalic, without obvious abnormality, atraumatic Eyes: conjunctivae/corneas clear. PERRL, EOM's intact. Fundi benign. Ears: normal TM's and external ear canals both ears Nose: Nares normal. Septum midline. Mucosa normal. No drainage or sinus tenderness. less nasal sounding. Throat: lips, mucosa, and tongue normal; teeth and gums normal. Trach site closed.. Neck: no adenopathy, no carotid bruit, no JVD, supple, symmetrical, trachea midline and thyroid not enlarged, symmetric, no tenderness/mass/nodules Back: symmetric, no curvature. ROM normal. No CVA tenderness. Resp: clear to auscultation bilaterally Cardio: heart regular without murmur. GI: soft, non-tender; bowel sounds normal; no masses,  no  organomegaly BS a little hyperactive Extremities: extremities normal, atraumatic, no cyanosis or edema Pulses: 2+ and symmetric Skin: Skin color, texture, turgor normal. No rashes or lesions Neurologic:very alert. Good conversation. Voice inflections better. Joking and kidding around.   Initiating nicely with breakfast. Moves all 4.equally now. fmc a little less right arm Incision/Wound: skin clean. Musc: full rom. Exam 2/20  Assessment/Plan: 1. Functional deficits secondary to meningioma with post op ICH which require 3+ hours per day of interdisciplinary therapy in a comprehensive inpatient.  rehab setting. Physiatrist is providing close team supervision and 24 hour management of active medical problems listed below. Physiatrist and rehab team continue to assess barriers to discharge/monitor patient progress toward functional and medical goals.  Pt may go home today.  FIM: FIM - Bathing Bathing Steps Patient Completed: Chest;Right Arm;Left Arm;Abdomen;Front perineal area;Buttocks;Right upper leg;Left upper leg;Right lower leg (including foot);Left lower leg (including foot) Bathing: 5: Supervision: Safety issues/verbal cues  FIM - Upper Body Dressing/Undressing Upper body dressing/undressing steps patient completed: Thread/unthread right bra strap;Thread/unthread left bra strap;Hook/unhook bra;Thread/unthread right sleeve of pullover shirt/dresss;Thread/unthread left sleeve of pullover shirt/dress;Put head through opening of pull over shirt/dress;Pull shirt over trunk Upper body dressing/undressing: 5: Supervision: Safety issues/verbal cues FIM - Lower Body Dressing/Undressing Lower body dressing/undressing steps patient completed: Thread/unthread right underwear leg;Thread/unthread left underwear leg;Pull underwear up/down;Thread/unthread right pants leg;Thread/unthread left pants leg;Pull pants up/down;Fasten/unfasten pants;Don/Doff right sock;Don/Doff left sock;Don/Doff right  shoe;Don/Doff left shoe;Fasten/unfasten right shoe;Fasten/unfasten left shoe Lower body dressing/undressing: 5: Set-up assist to: Obtain clothing  FIM - Toileting Toileting steps completed by patient: Adjust clothing  prior to toileting;Performs perineal hygiene;Adjust clothing after toileting Toileting Assistive Devices: Grab bar or rail for support Toileting: 5: Supervision: Safety issues/verbal cues  FIM - Diplomatic Services operational officer Devices: Art gallery manager Transfers: 5-To toilet/BSC: Supervision (verbal cues/safety issues)  FIM - Banker Devices: Environmental consultant;Bed rails;Arm rests Bed/Chair Transfer: 5: Supine > Sit: Supervision (verbal cues/safety issues);5: Sit > Supine: Supervision (verbal cues/safety issues);5: Bed > Chair or W/C: Supervision (verbal cues/safety issues);5: Chair or W/C > Bed: Supervision (verbal cues/safety issues)  FIM - Locomotion: Wheelchair Locomotion: Wheelchair: 0: Activity did not occur FIM - Locomotion: Ambulation Locomotion: Ambulation Assistive Devices:  (rollator walker) Ambulation/Gait Assistance: 5: Supervision Locomotion: Ambulation: 5: Travels 150 ft or more with supervision/safety issues  Comprehension Comprehension Mode: Auditory Comprehension: 5-Understands basic 90% of the time/requires cueing < 10% of the time  Expression Expression Mode: Verbal Expression: 5-Expresses basic 90% of the time/requires cueing < 10% of the time.  Social Interaction Social Interaction: 5-Interacts appropriately 90% of the time - Needs monitoring or encouragement for participation or interaction.  Problem Solving Problem Solving: 3-Solves basic 50 - 74% of the time/requires cueing 25 - 49% of the time  Memory Memory: 3-Recognizes or recalls 50 - 74% of the time/requires cueing 25 - 49% of the time   1. DVT Prophylaxis/Anticoagulation: Mechanical: Sequential compression devices, below knee Bilateral lower  extremities. BLE doppler negative.  2. Pain Management: added tylenol scheduled for leg symptoms, likely muscular. Exam inconsistent.Kpad and sports creme ordered with positive result.  3.Mood: affect improving. Ego support by team  -ritalin trial helpful. 10mg . Wean once home 4. Sphenoid wing meningioma with vision loss- left crani for debulking/resection of tumor.  5. Resection of tumor with right frontal ICH- on keppra for seizure prophylaxis. -Normalize sleep cycle.  6. Respiratory failure requiring trach: trach out without issues. 7. E coli UTI- cipro empirically through tomorrow. 8. ABLA: Added iron supplement. Recheck hgb stable. 9. HTN: Off tenormin currenty-could resume as an outpt as needed 10. FEN-eating well  11. Hallucinations and delusions likely are a combination of cognitive impairments as well as visual deficits. She has awareness to some extent that they aren't reality. These continue to fade. i have discussed with the patient and her husband as well.  12. Nasal congestion: resolved 13. Diarrhea-  Will treat proactively as c diff.  Give 10 day course of flagyl  -dc/ cipro tomorrow  -supplement potassium.  -push fluids at home LOS (Days) 21 A FACE TO FACE EVALUATION WAS PERFORMED  Giorgio Chabot T 07/28/2011, 7:54 AM

## 2011-07-28 NOTE — Progress Notes (Addendum)
Social Work  Discharge Note  The overall goal for the admission was met for:   Discharge location: Yes  - Home with husband and sister providing 24/7 supervision  Length of Stay: Yes - 21 days  Discharge activity level: Yes - supervision overall  Home/community participation: Yes  Services provided included: MD, RD, PT, OT, SLP, RN, CM, TR, Pharmacy, Neuropsych and SW  Financial Services: Medicare and Private Insurance: [facility]  Follow-up services arranged: Outpatient: PT and ST via [facility], DME: rollator walker via [facility] and Patient/Family has no preference for HH/DME agencies  Comments (or additional information): provided info on local Stroke Support Group  Patient/Family verbalized understanding of follow-up arrangements: Yes  Individual responsible for coordination of the follow-up plan: pt with assist from family  Confirmed correct DME delivered: Calin Ellery 07/28/2011    Yeng Perz   **Addendum Pt's follow up changed to home health via [facility] (approx. 2 weeks) due to medical issues.

## 2011-08-02 ENCOUNTER — Telehealth: Payer: Self-pay | Admitting: Physical Medicine & Rehabilitation

## 2011-08-02 NOTE — Telephone Encounter (Signed)
eval on Friday - 1wk/1, 2wk/3.  Pt complaining of dizziness, Vestibular examination? Exercises?

## 2011-08-03 ENCOUNTER — Telehealth: Payer: Self-pay | Admitting: Physical Medicine & Rehabilitation

## 2011-08-03 NOTE — Telephone Encounter (Signed)
i would recommend a vestibular assessment.  Also they need to check orthostatics.  thanks

## 2011-08-03 NOTE — Telephone Encounter (Signed)
LVM for [facility] nurse Debbie.  If dizziness is a new symptom, Patient needs to see neurologist per Delle Reining.

## 2011-08-03 NOTE — Telephone Encounter (Signed)
Please advise. Thanks.  

## 2011-08-03 NOTE — Telephone Encounter (Signed)
Rinaldo Cloud called back.  If dizziness is a new symptom, she needs to see her neurologist.  LVM for [facility] nurse Debbie.

## 2011-08-03 NOTE — Telephone Encounter (Signed)
LM with Jacqueline Christian giving her a verbal order for what Dr. Riley Kill wants.

## 2011-08-06 NOTE — Progress Notes (Signed)
Discharge summary # [id number] dictated.  RX for ritalin 10 mg #45 written at time of discharge.

## 2011-08-07 NOTE — Discharge Summary (Signed)
NAME:  Jacqueline, Christian                ACCOUNT NO.:  [id number]  MEDICAL RECORD NO.:  [id number]  LOCATION:  4028                         FACILITY:  [facility]  PHYSICIAN:  Ranelle Oyster, M.D.DATE OF BIRTH:  March 11, 1946  DATE OF ADMISSION:  07/07/2011 DATE OF DISCHARGE:  07/28/2011                              DISCHARGE SUMMARY   DISCHARGE DIAGNOSES: 1. Meningioma with postoperative intracerebral hemorrhage. 2. E. coli urinary tract infection, treated. 3. Diarrhea likely antibiotic related. 4. Hypokalemia secondary to diarrhea. 5. Acute blood loss anemia. 6. Hypertension. 7. Hallucinations, delusions resolving.  HISTORY OF PRESENT ILLNESS:  Ms. Jacqueline Christian is a 66 year old female with history of dizziness and visual deficits with workup revealing right anterior sphenoid wing meningioma which appeared to be encasing right carotid and affecting circulation.  There also appeared to be encasement of right optic nerve.  This mass measured 20 x 24 x 31 mm. The patient underwent microdissection with debulking of tumor on June 22, 2011, by Dr. Venetia Maxon.  Later that day, the patient with decrease in level of consciousness with difficulty following commands.  CT of brain done showed right frontal edema with acute intraparenchymal hemorrhage 4.7 x 3.3 cm with leftward shift and small amount of subdural hemorrhage.  The patient taken to OR for right frontal cranial for evacuation of intracerebral hemorrhage that p.m.  Postop was noted to be sedated and vent dependent.  She was placed on Decadron, Keppra, as well as IV antibiotics.  She was noted to have difficulty with vent wean requiring tracheostomy on July 02, 2011.  Currently tolerating trach collar.  MBS was done on July 06, 2011, and the patient without signs of aspiration or penetration, therefore started on regular diet.  She has had complaints of bilateral lower extremity pain and Dopplers of lower extremities done on July 05, 2101, were negative for DVT.  She currently continues with left upper extremity weakness as well as visual deficits.  Noted to have poor attention with distractibility.  Rehab was consulted for progressive therapies.  FUNCTIONAL HISTORY:  The patient was independent prior to admission. She has not been driving due to visual deficits.  Was noted to have decline in mobility in the few weeks prior to admission due to visual issues.  Functional status, the patient is max assist for bed mobility, +2 total assist, 60% for sit to stand, +2 total assist, 60% for stand pivot transfers, and to ambulate 8 feet.  She requires mod assist for grooming, total assist for lower body dressing.  Noted to have impaired problem solving, impaired initiation, able to follow 1 step command consistently with repetition.  Noted to have delayed processing.  PERTINENT LABORATORY WORK:  CBC on July 28, 2011, revealing hemoglobin 11.4, hematocrit 34.4, white count 10.7, platelets 191.  A check of lytes, on July 28, 2011, revealing sodium 145, potassium 3.0, chloride 107, CO2 29, BUN 11, creatinine 0.67, glucose 111.  UA UC of July 24, 2011, showed 95,000 colonies of E. coli.  The stool was set off for C. Diff and pending at time of discharge. Final results were negative.  HOSPITAL COURSE:  Ms. Jacqueline Christian was admitted to rehab on July 07, 2011, for inpatient therapies to consist of PT, OT, and speech therapy at least 3 hours 5 days a week.  Past admission, physiatrist, rehab, RN, and therapy team have worked together to provide customized collaborative interdisciplinary care.  Rehab RN has worked with the patient on bowel and bladder program as well as safety.  The patient continued to have issues with lower extremity pain and was treated locally with K-pad and sports cream.  Patient's blood pressures were monitored on b.i.d. basis during this stay.  Beta-blocker was discontinued as blood pressure  is on the low side.  Blood pressure at time of discharge is at 132/78.  The patient is afebrile.  She was noted to have issues with hallucination as well as delusions.   Neuropsych, Dr.Zelson, was consulted for input.  He felt the patient with acute increase in anxiety and dysphoria coupled with associated mild paranoia triggered by her misunderstanding of drill that was held in the hospital as well as ongoing visual hallucinations.  She had partial insight into hallucinations, but no insight into her confabulated or delusional idea. She was also noted to experience sense of diminished control in light secondary to her emerging awareness of her neurological deficits.  She reported periodic sad mood with her emerging awareness of deficits though without hopelessness, loss of motivation or suicidal ideation. He encouraged patient to avoid hypervigilance scanning for environmental threats.  Recommended listening to music to distract her from fears.  They recommended monitoring the patient while on unit and reassuring the patient that visual hallucination were often the symptoms of brain dysfunction.  Patient's overall delusions and hallucinations were noted to be improved by time of discharge.  A UA and UC was done due to some symptoms.  The patient's urine grew out greater than 5000 colonies of E. coli.  She was started on Cipro for treatment.  She did develop diarrhea while on Cipro.  She was started on Flagyl empirically for treatment of C. diff at time of discharge.  She was noted to have hypokalemia due to diarrhea issues and she was supplemented with potassium.  Her cranial incision was monitored along and was noted to be healing well without any signs or symptoms of infection.  During patient's stay in rehab, weekly team conferences were held to monitor patient's progress, set goals, as well as discuss barriers to discharge.  At admission, the patient required total assist  with mobility due to muscle weakness, impaired timing, problems with sequencing, decreased motor planning, as well as visual deficits.  She was noted to have decreased standing balance with decreased balance strategies.  She was noted to have left inattention requiring min cuing. She required total assist with basic self-care tasks.  Speech Therapy evaluation revealed patient with severe cognitive impairments characterized by decrease verbal and functional initiation, left inattention, decreased attention as well as poor problem solving.  th Passy-Muir speaking valve was used intially due to trach.  She was decannulated without difficulty, and was phonating well. At time of discharge, the patient was oriented x4. She could perform basic problem solving at 75-90% accuracy.  Her memory had improved to 75-89% for recall of information.  Comprehension had improved to 90%.  She continued to have impairments and problem solving. She was overall at min assist to supervision level due to her cognitive deficits.  The patient had progressed in her ability to carry out ADLs. She was at supervision for bathing, grooming, dressing, tub transfers, toilet transfers, and toileting.  She  still required occasional min cuing for initiation of tasks.  Family education was done with the patient's husband and sister who were independent for providing care needed.  The patient progressed in her mobility to being at supervision level for all transfers and mobility with use of rollator walker. Further follow up home health therapies to continue past discharge.  On August 05, 2011, the patient was discharged to home.  DISCHARGE MEDICATIONS: 1. Cipro 250 mg p.o. b.i.d. 2. Pepcid 20 mg b.i.d. 3. Ferrous sulfate 325 mg b.i.d. 4. Keppra 500 mg b.i.d. 5. Ritalin 10 mg p.o. at 8 a.m. and noon for 2 weeks then decrease to     1 in a.m. till gone. 6. Metronidazole 500 mg p.o. q.8 hours x10 days. 7. Sports cream to  lower extremities b.i.d. 8. Zyrtec 10 mg p.o. per day.  DIET:  Regular.  ACTIVITY LEVEL:  24 hour supervision and assistance.  SPECIAL INSTRUCTIONS:  [facility] to provide PT and speech therapy.  Do not use atenolol, oxybutynin.  FOLLOWUP:  The patient to follow up with Dr. Margo Common on August 03, 2011, at 2:15 p.m.  Follow up with Dr. Riley Kill on August 25, 2011, at 10 a.m. for 10:30 appointment.  Follow up with Dr. Venetia Maxon for postop check.     Delle Reining, P.A.   ______________________________ Ranelle Oyster, M.D.    PL/MEDQ  D:  08/06/2011  T:  08/07/2011  Job:  [id number]  cc:   Wyvonnia Lora, MD Danae Orleans. Venetia Maxon, M.D.

## 2011-08-20 DIAGNOSIS — Z0271 Encounter for disability determination: Secondary | ICD-10-CM

## 2011-08-25 ENCOUNTER — Inpatient Hospital Stay: Payer: Medicare Other | Admitting: Physical Medicine & Rehabilitation

## 2011-09-02 ENCOUNTER — Encounter: Payer: Self-pay | Admitting: Physical Medicine & Rehabilitation

## 2011-09-15 ENCOUNTER — Encounter: Payer: Medicare Other | Admitting: Physical Medicine & Rehabilitation

## 2011-10-06 ENCOUNTER — Inpatient Hospital Stay: Payer: Medicare Other | Admitting: Physical Medicine & Rehabilitation

## 2014-02-20 ENCOUNTER — Other Ambulatory Visit (HOSPITAL_COMMUNITY): Payer: Medicare Other

## 2014-02-21 ENCOUNTER — Encounter (HOSPITAL_COMMUNITY): Payer: Self-pay | Admitting: Pharmacy Technician

## 2014-02-25 ENCOUNTER — Encounter (HOSPITAL_COMMUNITY): Payer: Self-pay

## 2014-02-25 ENCOUNTER — Ambulatory Visit (HOSPITAL_COMMUNITY)
Admission: RE | Admit: 2014-02-25 | Discharge: 2014-02-25 | Disposition: A | Discharge status: Home / Self Care | Payer: Medicare Other | Source: Ambulatory Visit | Attending: Orthopedic Surgery | Admitting: Orthopedic Surgery

## 2014-02-25 ENCOUNTER — Encounter (HOSPITAL_COMMUNITY)
Admission: RE | Admit: 2014-02-25 | Discharge: 2014-02-25 | Disposition: A | Discharge status: Home / Self Care | Payer: Medicare Other | Source: Ambulatory Visit | Attending: Orthopaedic Surgery | Admitting: Orthopaedic Surgery

## 2014-02-25 DIAGNOSIS — Z01812 Encounter for preprocedural laboratory examination: Secondary | ICD-10-CM | POA: Insufficient documentation

## 2014-02-25 DIAGNOSIS — M1710 Unilateral primary osteoarthritis, unspecified knee: Secondary | ICD-10-CM | POA: Insufficient documentation

## 2014-02-25 DIAGNOSIS — Z01810 Encounter for preprocedural cardiovascular examination: Secondary | ICD-10-CM | POA: Diagnosis not present

## 2014-02-25 DIAGNOSIS — Z01818 Encounter for other preprocedural examination: Secondary | ICD-10-CM | POA: Diagnosis not present

## 2014-02-25 HISTORY — DX: Urinary tract infection, site not specified: N39.0

## 2014-02-25 HISTORY — DX: Pneumonia, unspecified organism: J18.9

## 2014-02-25 LAB — CBC WITH DIFFERENTIAL/PLATELET
BASOS PCT: 1 % (ref 0–1)
Basophils Absolute: 0 10*3/uL (ref 0.0–0.1)
Eosinophils Absolute: 0.4 10*3/uL (ref 0.0–0.7)
Eosinophils Relative: 4 % (ref 0–5)
HCT: 38.5 % (ref 36.0–46.0)
HEMOGLOBIN: 13.3 g/dL (ref 12.0–15.0)
LYMPHS ABS: 1.9 10*3/uL (ref 0.7–4.0)
Lymphocytes Relative: 22 % (ref 12–46)
MCH: 29.3 pg (ref 26.0–34.0)
MCHC: 34.5 g/dL (ref 30.0–36.0)
MCV: 84.8 fL (ref 78.0–100.0)
MONOS PCT: 5 % (ref 3–12)
Monocytes Absolute: 0.5 10*3/uL (ref 0.1–1.0)
NEUTROS ABS: 5.9 10*3/uL (ref 1.7–7.7)
NEUTROS PCT: 68 % (ref 43–77)
Platelets: 202 10*3/uL (ref 150–400)
RBC: 4.54 MIL/uL (ref 3.87–5.11)
RDW: 13.7 % (ref 11.5–15.5)
WBC: 8.6 10*3/uL (ref 4.0–10.5)

## 2014-02-25 LAB — PROTIME-INR
INR: 0.95 (ref 0.00–1.49)
Prothrombin Time: 12.7 seconds (ref 11.6–15.2)

## 2014-02-25 LAB — COMPREHENSIVE METABOLIC PANEL
ALT: 16 U/L (ref 0–35)
AST: 18 U/L (ref 0–37)
Albumin: 4.1 g/dL (ref 3.5–5.2)
Alkaline Phosphatase: 92 U/L (ref 39–117)
Anion gap: 11 (ref 5–15)
BUN: 20 mg/dL (ref 6–23)
CO2: 30 mEq/L (ref 19–32)
Calcium: 9.5 mg/dL (ref 8.4–10.5)
Chloride: 100 mEq/L (ref 96–112)
Creatinine, Ser: 0.82 mg/dL (ref 0.50–1.10)
GFR calc Af Amer: 83 mL/min — ABNORMAL LOW (ref >90)
GFR, EST NON AFRICAN AMERICAN: 71 mL/min — AB (ref >90)
Glucose, Bld: 92 mg/dL (ref 70–99)
Potassium: 3.3 mEq/L — ABNORMAL LOW (ref 3.7–5.3)
SODIUM: 141 meq/L (ref 137–147)
Total Bilirubin: 0.4 mg/dL (ref 0.3–1.2)
Total Protein: 7.6 g/dL (ref 6.0–8.3)

## 2014-02-25 LAB — SURGICAL PCR SCREEN
MRSA, PCR: NEGATIVE
STAPHYLOCOCCUS AUREUS: NEGATIVE

## 2014-02-25 LAB — TYPE AND SCREEN
ABO/RH(D): O POS
Antibody Screen: NEGATIVE

## 2014-02-25 LAB — APTT: APTT: 29 s (ref 24–37)

## 2014-02-25 NOTE — Pre-Procedure Instructions (Addendum)
Jacqueline Christian  02/25/2014   Your procedure is scheduled on:  03/05/14  Report to [facility] short stay admitting at 755 AM.  Call this number if you have problems the morning of surgery: [phone]   Remember:   Do not eat food or drink liquids after midnight.   Take these medicines the morning of surgery with A SIP OF WATER: ditropan, zanaflex, pain med if needed, effexor     Take all meds as ordered until day of surgery except as instructed below or per dr  Bridgette Habermann all herbel meds, nsaids (aleve,naproxen,advil,ibuprofen) 5 days prior to surgery(02/28/14) including vitamins,aspirin,coq10,   Do not wear jewelry, make-up or nail polish.  Do not wear lotions, powders, or perfumes. You may wear deodorant.  Do not shave 48 hours prior to surgery. Men may shave face and neck.  Do not bring valuables to the hospital.  [facility] is not responsible                  for any belongings or valuables.               Contacts, dentures or bridgework may not be worn into surgery.  Leave suitcase in the car. After surgery it may be brought to your room.  For patients admitted to the hospital, discharge time is determined by your                treatment team.               Patients discharged the day of surgery will not be allowed to drive  home.  Name and phone number of your driver:   Special Instructions:  Special Instructions: [place] - Preparing for Surgery  Before surgery, you can play an important role.  Because skin is not sterile, your skin needs to be as free of germs as possible.  You can reduce the number of germs on you skin by washing with CHG (chlorahexidine gluconate) soap before surgery.  CHG is an antiseptic cleaner which kills germs and bonds with the skin to continue killing germs even after washing.  Please DO NOT use if you have an allergy to CHG or antibacterial soaps.  If your skin becomes reddened/irritated stop using the CHG and inform your nurse when you arrive at Short  Stay.  Do not shave (including legs and underarms) for at least 48 hours prior to the first CHG shower.  You may shave your face.  Please follow these instructions carefully:   1.  Shower with CHG Soap the night before surgery and the morning of Surgery.  2.  If you choose to wash your hair, wash your hair first as usual with your normal shampoo.  3.  After you shampoo, rinse your hair and body thoroughly to remove the Shampoo.  4.  Use CHG as you would any other liquid soap.  You can apply chg directly  to the skin and wash gently with scrungie or a clean washcloth.  5.  Apply the CHG Soap to your body ONLY FROM THE NECK DOWN.  Do not use on open wounds or open sores.  Avoid contact with your eyes ears, mouth and genitals (private parts).  Wash genitals (private parts)       with your normal soap.  6.  Wash thoroughly, paying special attention to the area where your surgery will be performed.  7.  Thoroughly rinse your body with warm water from the neck down.  8.  DO NOT shower/wash with your normal soap after using and rinsing off the CHG Soap.  9.  Pat yourself dry with a clean towel.            10.  Wear clean pajamas.            11.  Place clean sheets on your bed the night of your first shower and do not sleep with pets.  Day of Surgery  Do not apply any lotions/deodorants the morning of surgery.  Please wear clean clothes to the hospital/surgery center.   Please read over the following fact sheets that you were given: Pain Booklet, Coughing and Deep Breathing, Blood Transfusion Information, Total Joint Packet, MRSA Information and Surgical Site Infection Prevention

## 2014-02-26 ENCOUNTER — Other Ambulatory Visit (HOSPITAL_COMMUNITY): Payer: Self-pay

## 2014-02-26 ENCOUNTER — Other Ambulatory Visit (HOSPITAL_COMMUNITY): Payer: Medicare Other

## 2014-02-26 ENCOUNTER — Ambulatory Visit (HOSPITAL_COMMUNITY)
Admission: RE | Admit: 2014-02-26 | Discharge: 2014-02-26 | Disposition: A | Discharge status: Home / Self Care | Payer: Medicare Other | Source: Ambulatory Visit | Attending: Orthopaedic Surgery | Admitting: Orthopaedic Surgery

## 2014-02-26 DIAGNOSIS — R350 Frequency of micturition: Secondary | ICD-10-CM | POA: Insufficient documentation

## 2014-02-26 DIAGNOSIS — Z01818 Encounter for other preprocedural examination: Secondary | ICD-10-CM | POA: Diagnosis not present

## 2014-02-26 LAB — URINE MICROSCOPIC-ADD ON

## 2014-02-26 LAB — URINALYSIS, ROUTINE W REFLEX MICROSCOPIC
Bilirubin Urine: NEGATIVE
GLUCOSE, UA: NEGATIVE mg/dL
KETONES UR: NEGATIVE mg/dL
Leukocytes, UA: NEGATIVE
Nitrite: NEGATIVE
PH: 7.5 (ref 5.0–8.0)
Protein, ur: 100 mg/dL — AB
Specific Gravity, Urine: 1.015 (ref 1.005–1.030)
Urobilinogen, UA: 0.2 mg/dL (ref 0.0–1.0)

## 2014-02-26 MED ORDER — CEFAZOLIN SODIUM-DEXTROSE 2-3 GM-% IV SOLR
2.0000 g | INTRAVENOUS | Status: DC
Start: 2014-03-05 — End: 2014-02-26

## 2014-02-26 NOTE — H&P (Signed)
CHIEF COMPLAINT:  Painful left knee.   HISTORY OF PRESENT ILLNESS:  Jacqueline Christian is a very pleasant, 68 year old, white female who is seen today for evaluation of her left knee.  Her symptoms started back in September 2014 with pain in the left knee and with popping and giving way of the knee without any history of injury or trauma.  It had gotten to the point where she had been limping and having difficulty with ambulation.  At that time, x-rays showed some mild degenerative changes.  It was felt that she might have some internal derangement and synovitis, so she was given a corticosteroid injection.  She returned back about a month later, and it was noted that the cortisone had made a very good difference for her.  She did have an MRI scan that showed that the body of the medial meniscus had diffusely diminutive irregular margins, and in the absence of prior surgery, it was consistent with chronic tears.  The lateral meniscus was a discoid configuration.  She was taken to the operating room on October 16th and underwent a partial medial and lateral meniscectomy.  She did have grade 2-3 changes in the patellofemoral area.  There was some loose articular cartilage which was debrided.  The medial compartment revealed some grade 3 and some grade 2 diffuse changes.  Laterally, she had a large discoid meniscus and tearing in the posterior horn, which had been debrided.  These were mild grade 2 changes that were then noted.  She did well over several months post surgery.  She still had some aches and pain but certainly had good improvement.  At 3 months post surgery, it was better than before, but she still was having symptoms.  A corticosteroid injection was given in January 2015, and a series of 3 Euflexxa injections were then given.  The injections certainly had benefit for her.  She returned back in May and was having trouble with recurrent pain, and again a corticosteroid injection was given, which again made a huge  difference in her symptoms.  With increasing pain and discomfort, we have already tried that of Euflexxa and cortisone injections, but she still continues to have pain and discomfort to the point where it is bothering her on a daily basis.  She is having difficulty with her activities of daily living.  Again, she has tried corticosteroid, she has tried viscosupplementation, and she has tried oral medications over the counter that of anti-inflammatories.  She basically is now to the point that she cannot tolerate having this pain and discomfort.  She is seen today for evaluation.   PAST MEDICAL HISTORY:  General health is good.   PAST SURGICAL HISTORY:  1.  In 1986, total abdominal hysterectomy with bilateral salpingoophorectomy. 2.  In 2005, right total knee arthroplasty. 3.  In 2013, meningeal benign lesion from the brain. 4.  February 2016, urinary tract infection without surgery.   CURRENT MEDICATIONS:  1.  Zyrtec 10 mg daily. 2.  Oxybutynin 5 mg b.i.d. 3.  Triamterene 37.5/25 mg daily. 4.  Venlafaxine 75 mg b.i.d. 5.  Tramadol 50 mg 2-3 tablets a day. 6.  Omega Plus daily. 7.  Aleve 200 mg b.i.d.   ALLERGIES:  MORPHINE WHICH GIVES HER A HEADACHE AND ALSO FLUSHING.   SOCIAL HISTORY:  She is a 68 year old, white, married female who is retired from [employer].  She denies the use of tobacco or alcohol.   FAMILY HISTORY:  Positive for the mother who is 51  years old and has had colon cancer in the past.  Her father died at 51 years old from a stroke.  He had heart disease, diabetes, as well as strokes.  The brother is well at 55 years old.  The sisters, 61 and 63 years old, are also well.   REVIEW OF SYSTEMS:  Fourteen point review of systems is positive for glasses as well as tinnitus and decreased hearing.  She does easily bruise.  She has urinary frequency about every 3-4 hours during the day but sleeps at night.  She does have a history of depression.   PHYSICAL EXAMINATION:     General:  Reveals a 69 year old, white female who is well developed, well nourished, alert, cooperative, and in moderate distress.  She is 5 feet 2-1/2 inches and weighs 177 pounds.  BMI is 31.9. Vital Signs:  Temperature 97.3.  Pulse 75.  Respiration rate 16.  Blood pressure 118/68. Head:  Normocephalic. Eyes:  Pupils are equal, round, and reactive to light and accommodation with extraocular movements intact. Ears, Nose, and Throat:  Benign. Chest:  Good expansion. Lungs:  Essentially clear. Cardiac:  Regular rhythm and rate.  Normal S1 and S2.  Distant heart sounds.  No murmurs noted. Lower extremities:  Pulses were 2+ bilateral and symmetric. Abdomen:  Scaphoid is soft and nontender.  No mass palpable.  Normal bowel sounds present. Neurological:  Oriented x3.  Cranial nerves 2 through 12 are grossly intact. Genital:  Not indicated for an orthopedic evaluation. Rectal:  Not indicated for an orthopedic evaluation. Breasts:  Not indicated for an orthopedic evaluation. Musculoskeletal:  She has range of motion from about 2-3 degrees to 100 degrees.  She does have a small effusion.  There is crepitance with range of motion.  She does have some mild pseudolaxity with varus and valgus stressing.   CLINICAL IMPRESSION:  Tricompartmental osteoarthritis of the left knee.   RECOMMENDATIONS:   1.  At this time, I have reviewed a clearance form from Dr. Matthias Hughs who has evaluated her from a medical and cardiac standpoint.  I have read through his entire note here that is in the chart, and he feels that she is medically and cardiac able to proceed with a total replacement of the left knee. 2.  Therefore, I have explained to her the procedure, risks, and benefits of the surgery and how the surgery will be performed.  I have used appropriate models.  Complications were given to her, and they were discussed.  She is understanding.    Mike Craze [place], [place] [phone]  02/26/2014 1:57 PM

## 2014-02-28 LAB — URINE CULTURE

## 2014-03-04 MED ORDER — ACETAMINOPHEN 10 MG/ML IV SOLN
1000.0000 mg | Freq: Once | INTRAVENOUS | Status: AC
  Administered 2014-03-05: 1000 mg via INTRAVENOUS
  Filled 2014-03-04: qty 100

## 2014-03-04 MED ORDER — CHLORHEXIDINE GLUCONATE 4 % EX LIQD
60.0000 mL | Freq: Every day | CUTANEOUS | Status: DC
Start: 2014-03-04 — End: 2014-03-05
  Filled 2014-03-04: qty 60

## 2014-03-04 MED ORDER — CHLORHEXIDINE GLUCONATE 4 % EX LIQD
60.0000 mL | Freq: Once | CUTANEOUS | Status: DC
  Filled 2014-03-04: qty 60

## 2014-03-05 ENCOUNTER — Encounter (HOSPITAL_COMMUNITY)
Admission: RE | Disposition: A | Discharge status: Home Health | Payer: Self-pay | Source: Ambulatory Visit | Attending: Orthopaedic Surgery

## 2014-03-05 ENCOUNTER — Inpatient Hospital Stay (HOSPITAL_COMMUNITY)
Admission: RE | Admit: 2014-03-05 | Discharge: 2014-03-07 | DRG: 470 | Disposition: A | Discharge status: Home Health | Payer: Medicare Other | Source: Ambulatory Visit | Attending: Orthopaedic Surgery | Admitting: Orthopaedic Surgery

## 2014-03-05 ENCOUNTER — Encounter (HOSPITAL_COMMUNITY): Payer: Medicare Other | Admitting: Anesthesiology

## 2014-03-05 ENCOUNTER — Inpatient Hospital Stay (HOSPITAL_COMMUNITY): Payer: Medicare Other | Admitting: Anesthesiology

## 2014-03-05 ENCOUNTER — Encounter (HOSPITAL_COMMUNITY): Payer: Self-pay | Admitting: *Deleted

## 2014-03-05 DIAGNOSIS — M179 Osteoarthritis of knee, unspecified: Secondary | ICD-10-CM | POA: Diagnosis present

## 2014-03-05 DIAGNOSIS — Z79899 Other long term (current) drug therapy: Secondary | ICD-10-CM

## 2014-03-05 DIAGNOSIS — E876 Hypokalemia: Secondary | ICD-10-CM | POA: Diagnosis not present

## 2014-03-05 DIAGNOSIS — D62 Acute posthemorrhagic anemia: Secondary | ICD-10-CM | POA: Diagnosis not present

## 2014-03-05 DIAGNOSIS — Z8603 Personal history of neoplasm of uncertain behavior: Secondary | ICD-10-CM

## 2014-03-05 DIAGNOSIS — Z90710 Acquired absence of both cervix and uterus: Secondary | ICD-10-CM

## 2014-03-05 DIAGNOSIS — M1710 Unilateral primary osteoarthritis, unspecified knee: Secondary | ICD-10-CM | POA: Diagnosis not present

## 2014-03-05 DIAGNOSIS — M1712 Unilateral primary osteoarthritis, left knee: Secondary | ICD-10-CM | POA: Diagnosis present

## 2014-03-05 DIAGNOSIS — M25562 Pain in left knee: Secondary | ICD-10-CM | POA: Diagnosis present

## 2014-03-05 DIAGNOSIS — Z96652 Presence of left artificial knee joint: Secondary | ICD-10-CM

## 2014-03-05 DIAGNOSIS — I10 Essential (primary) hypertension: Secondary | ICD-10-CM | POA: Diagnosis present

## 2014-03-05 DIAGNOSIS — Z96659 Presence of unspecified artificial knee joint: Secondary | ICD-10-CM

## 2014-03-05 HISTORY — PX: TOTAL KNEE ARTHROPLASTY: SHX125

## 2014-03-05 SURGERY — ARTHROPLASTY, KNEE, TOTAL
Anesthesia: Monitor Anesthesia Care | Site: Knee | Laterality: Left

## 2014-03-05 MED ORDER — SENNOSIDES-DOCUSATE SODIUM 8.6-50 MG PO TABS: 1.0000 | ORAL_TABLET | Freq: Every evening | ORAL | Status: DC | PRN

## 2014-03-05 MED ORDER — DOCUSATE SODIUM 100 MG PO CAPS
100.0000 mg | ORAL_CAPSULE | Freq: Two times a day (BID) | ORAL | Status: DC
  Administered 2014-03-05 – 2014-03-07 (×4): 100 mg via ORAL
  Filled 2014-03-05 (×4): qty 1

## 2014-03-05 MED ORDER — ONDANSETRON HCL 4 MG PO TABS: 4.0000 mg | ORAL_TABLET | Freq: Four times a day (QID) | ORAL | Status: DC | PRN

## 2014-03-05 MED ORDER — RIVAROXABAN 10 MG PO TABS
10.0000 mg | ORAL_TABLET | Freq: Every day | ORAL | Status: DC
  Administered 2014-03-06 – 2014-03-07 (×2): 10 mg via ORAL
  Filled 2014-03-05 (×3): qty 1

## 2014-03-05 MED ORDER — ALUM & MAG HYDROXIDE-SIMETH 200-200-20 MG/5ML PO SUSP: 30.0000 mL | ORAL | Status: DC | PRN

## 2014-03-05 MED ORDER — PROPOFOL 10 MG/ML IV BOLUS
INTRAVENOUS | Status: AC
  Filled 2014-03-05: qty 20

## 2014-03-05 MED ORDER — FENTANYL CITRATE 0.05 MG/ML IJ SOLN
INTRAMUSCULAR | Status: AC
  Administered 2014-03-05: 50 ug via INTRAVENOUS
  Filled 2014-03-05: qty 2

## 2014-03-05 MED ORDER — FENTANYL CITRATE 0.05 MG/ML IJ SOLN
INTRAMUSCULAR | Status: AC
  Filled 2014-03-05: qty 5

## 2014-03-05 MED ORDER — HYDROMORPHONE HCL 1 MG/ML IJ SOLN
0.5000 mg | INTRAMUSCULAR | Status: DC | PRN
  Administered 2014-03-05 – 2014-03-07 (×5): 0.5 mg via INTRAVENOUS
  Filled 2014-03-05 (×3): qty 1

## 2014-03-05 MED ORDER — OXYCODONE HCL 5 MG PO TABS
5.0000 mg | ORAL_TABLET | ORAL | Status: DC | PRN
  Administered 2014-03-05 – 2014-03-07 (×11): 10 mg via ORAL
  Filled 2014-03-05 (×12): qty 2

## 2014-03-05 MED ORDER — ONDANSETRON HCL 4 MG/2ML IJ SOLN
4.0000 mg | Freq: Four times a day (QID) | INTRAMUSCULAR | Status: DC | PRN
Start: 2014-03-05 — End: 2014-03-07

## 2014-03-05 MED ORDER — TRIAMTERENE-HCTZ 37.5-25 MG PO TABS
1.0000 | ORAL_TABLET | Freq: Every day | ORAL | Status: DC
  Administered 2014-03-05: 1 via ORAL
  Filled 2014-03-05 (×2): qty 1

## 2014-03-05 MED ORDER — SODIUM CHLORIDE 0.9 % IV SOLN: INTRAVENOUS | Status: DC

## 2014-03-05 MED ORDER — VENLAFAXINE HCL 75 MG PO TABS
75.0000 mg | ORAL_TABLET | Freq: Two times a day (BID) | ORAL | Status: DC
  Administered 2014-03-05 – 2014-03-07 (×5): 75 mg via ORAL
  Filled 2014-03-05 (×6): qty 1

## 2014-03-05 MED ORDER — LACTATED RINGERS IV SOLN
INTRAVENOUS | Status: DC | PRN
Start: 2014-03-05 — End: 2014-03-05
  Administered 2014-03-05 (×2): via INTRAVENOUS

## 2014-03-05 MED ORDER — LIDOCAINE HCL (CARDIAC) 20 MG/ML IV SOLN
INTRAVENOUS | Status: AC
  Filled 2014-03-05: qty 5

## 2014-03-05 MED ORDER — LORATADINE 10 MG PO TABS
10.0000 mg | ORAL_TABLET | Freq: Every day | ORAL | Status: DC
  Administered 2014-03-06 – 2014-03-07 (×2): 10 mg via ORAL
  Filled 2014-03-05 (×2): qty 1

## 2014-03-05 MED ORDER — ACETAMINOPHEN 10 MG/ML IV SOLN
1000.0000 mg | Freq: Four times a day (QID) | INTRAVENOUS | Status: DC
  Administered 2014-03-05 – 2014-03-06 (×3): 1000 mg via INTRAVENOUS
  Filled 2014-03-05 (×4): qty 100

## 2014-03-05 MED ORDER — KETOROLAC TROMETHAMINE 15 MG/ML IJ SOLN
15.0000 mg | Freq: Four times a day (QID) | INTRAMUSCULAR | Status: AC
Start: 2014-03-05 — End: 2014-03-05
  Administered 2014-03-05 (×2): 15 mg via INTRAVENOUS
  Filled 2014-03-05 (×2): qty 1

## 2014-03-05 MED ORDER — MIDAZOLAM HCL 5 MG/5ML IJ SOLN
INTRAMUSCULAR | Status: DC | PRN
  Administered 2014-03-05: 2 mg via INTRAVENOUS

## 2014-03-05 MED ORDER — DEXTROSE 5 % IV SOLN
500.0000 mg | INTRAVENOUS | Status: AC
  Administered 2014-03-05: 500 mg via INTRAVENOUS
  Filled 2014-03-05: qty 5

## 2014-03-05 MED ORDER — PHENYLEPHRINE 40 MCG/ML (10ML) SYRINGE FOR IV PUSH (FOR BLOOD PRESSURE SUPPORT)
PREFILLED_SYRINGE | INTRAVENOUS | Status: AC
  Filled 2014-03-05: qty 10

## 2014-03-05 MED ORDER — BISACODYL 5 MG PO TBEC
5.0000 mg | DELAYED_RELEASE_TABLET | Freq: Every day | ORAL | Status: DC | PRN
  Administered 2014-03-07: 5 mg via ORAL
  Filled 2014-03-05: qty 1

## 2014-03-05 MED ORDER — METHOCARBAMOL 1000 MG/10ML IJ SOLN
500.0000 mg | Freq: Four times a day (QID) | INTRAVENOUS | Status: DC | PRN
  Filled 2014-03-05: qty 5

## 2014-03-05 MED ORDER — LACTATED RINGERS IV SOLN
INTRAVENOUS | Status: DC
  Administered 2014-03-05: 09:00:00 via INTRAVENOUS

## 2014-03-05 MED ORDER — BUPIVACAINE-EPINEPHRINE 0.25% -1:200000 IJ SOLN
INTRAMUSCULAR | Status: DC | PRN
  Administered 2014-03-05: 20 mL

## 2014-03-05 MED ORDER — NEOSTIGMINE METHYLSULFATE 10 MG/10ML IV SOLN
INTRAVENOUS | Status: AC
  Filled 2014-03-05: qty 1

## 2014-03-05 MED ORDER — MIDAZOLAM HCL 2 MG/2ML IJ SOLN
INTRAMUSCULAR | Status: AC
  Filled 2014-03-05: qty 2

## 2014-03-05 MED ORDER — VENLAFAXINE HCL 75 MG PO TABS: 75.0000 mg | ORAL_TABLET | Freq: Two times a day (BID) | ORAL | Status: DC

## 2014-03-05 MED ORDER — ONDANSETRON HCL 4 MG/2ML IJ SOLN
INTRAMUSCULAR | Status: AC
  Filled 2014-03-05: qty 2

## 2014-03-05 MED ORDER — KETOROLAC TROMETHAMINE 15 MG/ML IJ SOLN
INTRAMUSCULAR | Status: AC
  Filled 2014-03-05: qty 1

## 2014-03-05 MED ORDER — MIDAZOLAM HCL 2 MG/2ML IJ SOLN
INTRAMUSCULAR | Status: AC
  Administered 2014-03-05: 1 mg via INTRAVENOUS
  Filled 2014-03-05: qty 2

## 2014-03-05 MED ORDER — METOCLOPRAMIDE HCL 5 MG PO TABS
5.0000 mg | ORAL_TABLET | Freq: Three times a day (TID) | ORAL | Status: DC | PRN
  Filled 2014-03-05: qty 2

## 2014-03-05 MED ORDER — KETOROLAC TROMETHAMINE 15 MG/ML IJ SOLN
7.5000 mg | Freq: Four times a day (QID) | INTRAMUSCULAR | Status: DC
  Administered 2014-03-05 – 2014-03-06 (×2): 7.5 mg via INTRAVENOUS
  Filled 2014-03-05: qty 1

## 2014-03-05 MED ORDER — SODIUM CHLORIDE 0.9 % IR SOLN
Status: DC | PRN
  Administered 2014-03-05: 1

## 2014-03-05 MED ORDER — METOCLOPRAMIDE HCL 5 MG/ML IJ SOLN: 10.0000 mg | Freq: Once | INTRAMUSCULAR | Status: DC | PRN

## 2014-03-05 MED ORDER — MENTHOL 3 MG MT LOZG: 1.0000 | LOZENGE | OROMUCOSAL | Status: DC | PRN

## 2014-03-05 MED ORDER — MAGNESIUM CITRATE PO SOLN: 1.0000 | Freq: Once | ORAL | Status: AC | PRN

## 2014-03-05 MED ORDER — PHENYLEPHRINE HCL 10 MG/ML IJ SOLN
INTRAMUSCULAR | Status: DC | PRN
  Administered 2014-03-05: 80 ug via INTRAVENOUS
  Administered 2014-03-05: 120 ug via INTRAVENOUS
  Administered 2014-03-05: 80 ug via INTRAVENOUS

## 2014-03-05 MED ORDER — VANCOMYCIN HCL IN DEXTROSE 1-5 GM/200ML-% IV SOLN
1000.0000 mg | Freq: Once | INTRAVENOUS | Status: AC
  Administered 2014-03-05: 1000 mg via INTRAVENOUS

## 2014-03-05 MED ORDER — PHENOL 1.4 % MT LIQD: 1.0000 | OROMUCOSAL | Status: DC | PRN

## 2014-03-05 MED ORDER — CEFAZOLIN SODIUM-DEXTROSE 2-3 GM-% IV SOLR
2.0000 g | Freq: Four times a day (QID) | INTRAVENOUS | Status: AC
  Administered 2014-03-05 (×2): 2 g via INTRAVENOUS
  Filled 2014-03-05 (×2): qty 50

## 2014-03-05 MED ORDER — BUPIVACAINE-EPINEPHRINE (PF) 0.25% -1:200000 IJ SOLN
INTRAMUSCULAR | Status: AC
  Filled 2014-03-05: qty 30

## 2014-03-05 MED ORDER — FENTANYL CITRATE 0.05 MG/ML IJ SOLN
50.0000 ug | Freq: Once | INTRAMUSCULAR | Status: AC
  Administered 2014-03-05: 50 ug via INTRAVENOUS

## 2014-03-05 MED ORDER — THROMBIN 20000 UNITS EX KIT
PACK | CUTANEOUS | Status: AC
  Filled 2014-03-05: qty 1

## 2014-03-05 MED ORDER — OXYBUTYNIN CHLORIDE 5 MG PO TABS
5.0000 mg | ORAL_TABLET | Freq: Two times a day (BID) | ORAL | Status: DC
  Administered 2014-03-05 – 2014-03-07 (×4): 5 mg via ORAL
  Filled 2014-03-05 (×5): qty 1

## 2014-03-05 MED ORDER — GLYCOPYRROLATE 0.2 MG/ML IJ SOLN
INTRAMUSCULAR | Status: AC
  Filled 2014-03-05: qty 2

## 2014-03-05 MED ORDER — HYDROMORPHONE HCL 1 MG/ML IJ SOLN
INTRAMUSCULAR | Status: AC
  Filled 2014-03-05: qty 1

## 2014-03-05 MED ORDER — PROPOFOL INFUSION 10 MG/ML OPTIME
INTRAVENOUS | Status: DC | PRN
  Administered 2014-03-05: 15 ug/kg/min via INTRAVENOUS

## 2014-03-05 MED ORDER — FENTANYL CITRATE 0.05 MG/ML IJ SOLN: 25.0000 ug | INTRAMUSCULAR | Status: DC | PRN

## 2014-03-05 MED ORDER — VANCOMYCIN HCL IN DEXTROSE 1-5 GM/200ML-% IV SOLN
INTRAVENOUS | Status: AC
  Filled 2014-03-05: qty 200

## 2014-03-05 MED ORDER — METOCLOPRAMIDE HCL 5 MG/ML IJ SOLN: 5.0000 mg | Freq: Three times a day (TID) | INTRAMUSCULAR | Status: DC | PRN

## 2014-03-05 MED ORDER — ONDANSETRON HCL 4 MG/2ML IJ SOLN
INTRAMUSCULAR | Status: DC | PRN
  Administered 2014-03-05: 4 mg via INTRAVENOUS

## 2014-03-05 MED ORDER — METHOCARBAMOL 500 MG PO TABS
500.0000 mg | ORAL_TABLET | Freq: Four times a day (QID) | ORAL | Status: DC | PRN
  Administered 2014-03-06 – 2014-03-07 (×4): 500 mg via ORAL
  Filled 2014-03-05 (×5): qty 1

## 2014-03-05 MED ORDER — SODIUM CHLORIDE 0.9 % IV SOLN
75.0000 mL/h | INTRAVENOUS | Status: DC
  Administered 2014-03-05: 75 mL/h via INTRAVENOUS

## 2014-03-05 MED ORDER — MIDAZOLAM HCL 2 MG/2ML IJ SOLN
1.0000 mg | Freq: Once | INTRAMUSCULAR | Status: AC
  Administered 2014-03-05: 1 mg via INTRAVENOUS

## 2014-03-05 SURGICAL SUPPLY — 60 items
BANDAGE ESMARK 6X9 LF (GAUZE/BANDAGES/DRESSINGS) ×1 IMPLANT
BLADE SAGITTAL 25.0X1.19X90 (BLADE) ×2 IMPLANT
BLADE SAGITTAL 25.0X1.19X90MM (BLADE) ×1
BNDG CMPR 9X6 STRL LF SNTH (GAUZE/BANDAGES/DRESSINGS) ×1
BNDG ESMARK 6X9 LF (GAUZE/BANDAGES/DRESSINGS) ×3
BOWL SMART MIX CTS (DISPOSABLE) ×3 IMPLANT
CAPT RP KNEE ×3 IMPLANT
CEMENT HV SMART SET (Cement) ×6 IMPLANT
COVER SURGICAL LIGHT HANDLE (MISCELLANEOUS) ×3 IMPLANT
CUFF TOURNIQUET SINGLE 34IN LL (TOURNIQUET CUFF) ×3 IMPLANT
CUFF TOURNIQUET SINGLE 44IN (TOURNIQUET CUFF) IMPLANT
DRAPE EXTREMITY T 121X128X90 (DRAPE) ×3 IMPLANT
DRAPE PROXIMA HALF (DRAPES) ×3 IMPLANT
DRSG ADAPTIC 3X8 NADH LF (GAUZE/BANDAGES/DRESSINGS) ×3 IMPLANT
DRSG PAD ABDOMINAL 8X10 ST (GAUZE/BANDAGES/DRESSINGS) ×3 IMPLANT
DURAPREP 26ML APPLICATOR (WOUND CARE) ×6 IMPLANT
ELECT CAUTERY BLADE 6.4 (BLADE) ×3 IMPLANT
ELECT REM PT RETURN 9FT ADLT (ELECTROSURGICAL) ×3
ELECTRODE REM PT RTRN 9FT ADLT (ELECTROSURGICAL) ×1 IMPLANT
EVACUATOR 1/8 PVC DRAIN (DRAIN) ×3 IMPLANT
FACESHIELD WRAPAROUND (MASK) ×6 IMPLANT
FLOSEAL 10ML (HEMOSTASIS) IMPLANT
GAUZE SPONGE 4X4 12PLY STRL (GAUZE/BANDAGES/DRESSINGS) ×3 IMPLANT
GLOVE BIOGEL PI IND STRL 8 (GLOVE) ×1 IMPLANT
GLOVE BIOGEL PI IND STRL 8.5 (GLOVE) ×1 IMPLANT
GLOVE BIOGEL PI INDICATOR 8 (GLOVE) ×2
GLOVE BIOGEL PI INDICATOR 8.5 (GLOVE) ×2
GLOVE ECLIPSE 8.0 STRL XLNG CF (GLOVE) ×6 IMPLANT
GLOVE SURG ORTHO 8.5 STRL (GLOVE) ×6 IMPLANT
GOWN STRL REUS W/ TWL LRG LVL3 (GOWN DISPOSABLE) ×2 IMPLANT
GOWN STRL REUS W/TWL 2XL LVL3 (GOWN DISPOSABLE) ×3 IMPLANT
GOWN STRL REUS W/TWL LRG LVL3 (GOWN DISPOSABLE) ×4
HANDPIECE INTERPULSE COAX TIP (DISPOSABLE) ×2
KIT BASIN OR (CUSTOM PROCEDURE TRAY) ×3 IMPLANT
KIT ROOM TURNOVER OR (KITS) ×3 IMPLANT
MANIFOLD NEPTUNE II (INSTRUMENTS) ×3 IMPLANT
NEEDLE 22X1 1/2 (OR ONLY) (NEEDLE) ×3 IMPLANT
NS IRRIG 1000ML POUR BTL (IV SOLUTION) ×3 IMPLANT
PACK TOTAL JOINT (CUSTOM PROCEDURE TRAY) ×3 IMPLANT
PAD ARMBOARD 7.5X6 YLW CONV (MISCELLANEOUS) ×6 IMPLANT
PAD CAST 4YDX4 CTTN HI CHSV (CAST SUPPLIES) ×1 IMPLANT
PADDING CAST COTTON 4X4 STRL (CAST SUPPLIES) ×2
PADDING CAST COTTON 6X4 STRL (CAST SUPPLIES) ×3 IMPLANT
SET HNDPC FAN SPRY TIP SCT (DISPOSABLE) ×1 IMPLANT
SPONGE GAUZE 4X4 12PLY STER LF (GAUZE/BANDAGES/DRESSINGS) ×3 IMPLANT
STAPLER VISISTAT 35W (STAPLE) ×3 IMPLANT
SUCTION FRAZIER TIP 10 FR DISP (SUCTIONS) ×3 IMPLANT
SUT BONE WAX W31G (SUTURE) ×3 IMPLANT
SUT ETHIBOND NAB CT1 #1 30IN (SUTURE) ×9 IMPLANT
SUT MNCRL AB 3-0 PS2 18 (SUTURE) ×3 IMPLANT
SUT VIC AB 0 CT1 27 (SUTURE) ×3
SUT VIC AB 0 CT1 27XBRD ANBCTR (SUTURE) ×1 IMPLANT
SUT VIC AB 1 CT1 27 (SUTURE) ×2
SUT VIC AB 1 CT1 27XBRD ANBCTR (SUTURE) ×1 IMPLANT
SYR CONTROL 10ML LL (SYRINGE) ×3 IMPLANT
TOWEL OR 17X24 6PK STRL BLUE (TOWEL DISPOSABLE) ×3 IMPLANT
TOWEL OR 17X26 10 PK STRL BLUE (TOWEL DISPOSABLE) ×3 IMPLANT
TRAY FOLEY CATH 16FRSI W/METER (SET/KITS/TRAYS/PACK) IMPLANT
WATER STERILE IRR 1000ML POUR (IV SOLUTION) ×6 IMPLANT
WRAP KNEE MAXI GEL POST OP (GAUZE/BANDAGES/DRESSINGS) ×3 IMPLANT

## 2014-03-05 NOTE — Plan of Care (Signed)
Problem: Consults Goal: Diagnosis- Total Joint Replacement Primary Total Knee Left     

## 2014-03-05 NOTE — Transfer of Care (Signed)
Immediate Anesthesia Transfer of Care Note  Patient: Jacqueline Christian  Procedure(s) Performed: Procedure(s): TOTAL KNEE ARTHROPLASTY (Left)  Patient Location: PACU  Anesthesia Type:Regional and Spinal  Level of Consciousness: awake, alert  and patient cooperative  Airway & Oxygen Therapy: Patient Spontanous Breathing  Post-op Assessment: Report given to PACU RN  Post vital signs: Reviewed and stable  Complications: No apparent anesthesia complications

## 2014-03-05 NOTE — Anesthesia Postprocedure Evaluation (Signed)
  Anesthesia Post-op Note  Patient: Jacqueline Christian  Procedure(s) Performed: Procedure(s): TOTAL KNEE ARTHROPLASTY (Left)  Patient Location: PACU  Anesthesia Type:Spinal  Level of Consciousness: awake, alert  and oriented  Airway and Oxygen Therapy: Patient Spontanous Breathing and Patient connected to nasal cannula oxygen  Post-op Pain: mild  Post-op Assessment: Post-op Vital signs reviewed, Patient's Cardiovascular Status Stable, Respiratory Function Stable, Patent Airway, No signs of Nausea or vomiting and Pain level controlled  Post-op Vital Signs: stable  Last Vitals:  Filed Vitals:   03/05/14 1450  BP: 151/69  Pulse: 63  Temp: 36.4 C  Resp: 16    Complications: No apparent anesthesia complications

## 2014-03-05 NOTE — Progress Notes (Signed)
Orthopedic Tech Progress Note Patient Details:  Jacqueline Christian [birth year] [id number] Off cpm at 8:00 pm Patient ID: Jacqueline Christian, female   DOB: 1946/01/01, 68 y.o.   MRN: [id number]   [name] 03/05/2014, 8:03 PM

## 2014-03-05 NOTE — Anesthesia Procedure Notes (Addendum)
Date/Time: 03/05/2014 10:50 AM Performed by: Izora Gala Pre-anesthesia Checklist: Patient identified, Emergency Drugs available, Suction available and Patient being monitored Oxygen Delivery Method: Simple face mask Placement Confirmation: positive ETCO2   Spinal  Patient location during procedure: OR Start time: 03/05/2014 11:20 AM End time: 03/05/2014 11:25 AM Staffing Performed by: anesthesiologist  Preanesthetic Checklist Completed: patient identified, site marked, surgical consent, pre-op evaluation, timeout performed, IV checked, risks and benefits discussed and monitors and equipment checked Spinal Block Patient position: left lateral decubitus Prep: ChloraPrep Patient monitoring: heart rate, cardiac monitor, continuous pulse ox and blood pressure Approach: left paramedian Location: L4-5 Injection technique: single-shot Needle Needle type: Tuohy  Needle gauge: 22 G Needle length: 9 cm Needle insertion depth: 5 cm Assessment Sensory level: T10 Additional Notes 9 mg 0.75% marcaine injected easily

## 2014-03-05 NOTE — Anesthesia Preprocedure Evaluation (Signed)
Anesthesia Evaluation  Patient identified by MRN, date of birth, ID band Patient awake    Reviewed: Allergy & Precautions, H&P , NPO status , Patient's Chart, lab work & pertinent test results  Airway Mallampati: II TM Distance: >3 FB Neck ROM: Full    Dental  (+) Teeth Intact, Dental Advisory Given   Pulmonary  breath sounds clear to auscultation        Cardiovascular hypertension, Rhythm:Regular Rate:Normal     Neuro/Psych    GI/Hepatic   Endo/Other    Renal/GU      Musculoskeletal   Abdominal   Peds  Hematology   Anesthesia Other Findings   Reproductive/Obstetrics                           Anesthesia Physical Anesthesia Plan  ASA: II  Anesthesia Plan: MAC and Spinal   Post-op Pain Management:    Induction: Intravenous  Airway Management Planned: Simple Face Mask  Additional Equipment:   Intra-op Plan:   Post-operative Plan:   Informed Consent: I have reviewed the patients History and Physical, chart, labs and discussed the procedure including the risks, benefits and alternatives for the proposed anesthesia with the patient or authorized representative who has indicated his/her understanding and acceptance.   Dental advisory given  Plan Discussed with: CRNA and Anesthesiologist  Anesthesia Plan Comments:         Anesthesia Quick Evaluation

## 2014-03-05 NOTE — Progress Notes (Signed)
OT Cancellation Note  Patient Details Name: Jacqueline Christian MRN: [id number] DOB: 05/11/1946   Cancelled Treatment:    Reason Eval/Treat Not Completed: OT screened, no needs identified, will sign off. Spoke to pt and she has no OT concerns.  Benito Mccreedy OTR/L [phone] 03/05/2014, 5:06 PM

## 2014-03-05 NOTE — Progress Notes (Signed)
Orthopedic Tech Progress Note Patient Details:  Jacqueline Christian [birth year] [id number]  CPM Left Knee CPM Left Knee: On Left Knee Flexion (Degrees): 60 Left Knee Extension (Degrees): 0 Additional Comments: applied ohf and footsie roll   [name] 03/05/2014, 3:28 PM

## 2014-03-05 NOTE — Progress Notes (Signed)
Utilization review completed.  

## 2014-03-05 NOTE — Op Note (Signed)
NAME:  Jacqueline Christian, Jacqueline Christian                ACCOUNT NO.:  [id number]  MEDICAL RECORD NO.:  [id number]  LOCATION:  5N24C                        FACILITY:  [facility]  PHYSICIAN:  Vonna Kotyk. Joyceann Kruser, M.D.DATE OF BIRTH:  June 07, 1946  DATE OF PROCEDURE: DATE OF DISCHARGE:                              OPERATIVE REPORT   PREOPERATIVE DIAGNOSIS:  End-stage osteoarthritis, left knee.  POSTOPERATIVE DIAGNOSIS:  End-stage osteoarthritis, left knee.  PROCEDURE:  Left total knee replacement.  SURGEON:  Vonna Kotyk. Durward Fortes, MD  ASSISTANT:  Mike Craze. Petrarca, PA-C, was present throughout the operative procedure to ensure its timely completion.  ANESTHESIA:  Spinal with IV sedation.  COMPLICATIONS:  None.  COMPONENTS:  DePuy LCS standard femoral component, #2.5 keeled tibial tray with a 10-mm polyethylene bridging bearing a metal-back 3-peg rotating patella.  Components were secured with polymethyl methacrylate.  DESCRIPTION OF PROCEDURE:  Mrs. Ure was met in the holding area with her husband, identified the left knee as appropriate operative site and marked it accordingly, answered any questions.  The patient was then transported to room #7 and placed under spinal anesthesia with IV sedation without problem.  Foley was not inserted.  The left lower extremity was then placed in a thigh tourniquet.  The leg was prepped with chlorhexidine scrub and then DuraPrep from the tourniquet to the tip of the toes.  Sterile draping was performed.  With the extremity still elevated, was Esmarch, exsanguinated with a proximal tourniquet at 350 mmHg.  A midline longitudinal incision was made centered about the patella extending from the superior pouch to tibial tubercle.  Via sharp dissection, incision was carried down to subcutaneous tissue.  Gross bleeders were Bovie coagulated.  A medial parapatellar incision was made through the deep capsule with the Bovie.  The joint was entered.  There was minimal clear  yellow joint effusion.  The patella was everted to 180 degrees laterally and knee flexed to 90 degrees.  There were relatively large osteophytes along the medial and lateral femoral condyle.  I sized a standard femoral component.  There was near complete absence of articular cartilage along the medial femoral condyle with osteophytes along the medial tibial plateau, these were removed as well.  First, bony cut was made transversely in the proximal tibia with a 7- degree angle of declination using the external tibial guide.  Subsequent cuts were then made on the femur using the standard femoral jig and a lamina spreader was inserted along the medial and lateral compartments. Medial and lateral menisci removed as well as ACL and PCL.  There was several small loose bodies posteriorly and medially.  There was a small Baker's cyst, but the visualized lining was removed.  Osteophytes removed from the posterior femoral condyles with a curved 3/4 inch osteotome.  MCL and LCL remained intact throughout the procedure. Flexion and extension gaps were perfectly symmetrical at 10 mm. Subsequent cuts were then made on the femur.  Again using the standard femoral component, I used a 4-degree distal femoral valgus cut and then the final femoral cut to obtain tapering in the center holes.  Retractors were then placed about the tibia, was advanced anteriorly, measured a #2.5 tibial tray, this  was pinned in place.  Center hole made followed by the keel cut with the tibial jig in place that the trial polyethylene liner was inserted followed by the standard femoral component.  The entire joint was then reduced through a full range of motion, full in slight hyperextension.  No opening with varus or valgus stress and negative anterior drawer sign.  The patella was prepared removing approximately 10 mm of bone leaving 13 mm of patellar thickness.  The patellar jig was applied, 3 holes made. The trial  patella inserted and then reduced through a full range of motion, was perfectly stable.  The trial components removed.  The joint was copiously irrigated with saline solution.  The final components were then impacted with polymethyl methacrylate, initially inserted the metallic tibial tray followed by the 10-mm polyethylene bridging bearing and then the standard femoral component. The joint was reduced and compressed.  Extraneous methacrylate was removed from the periphery of the components.  Component was applied with polymethyl methacrylate in the patellar clamp.  Extraneous methacrylate was removed from its periphery.  At approximately 16 minutes, the methacrylate had matured, during which time, we performed further synovectomy and then injected the deep capsule with 0.25% Marcaine with epinephrine.  After maturation, methacrylate tourniquet was deflated at approximately 66 minutes.  The gross bleeders were Bovie coagulated.  Bone wax was applied to bleeding bone surfaces.  We had a nicest clean and hemostatic field.  A medium- sized Hemovac was inserted through the lateral capsule.  Wound was again irrigated with saline solution.  The deep capsule was closed with a running 0 Ethibond.  Superficial capsule closed with running 0 Vicryl, subcu with 2-0 Vicryl and 3-0 Monocryl.  Skin closed with skin clips.  Sterile bulky dressing was applied followed by the patient's support stocking.  The patient tolerated the procedure without complications.  The patient returned to the postanesthesia recovery room in satisfactory condition.     Vonna Kotyk. Durward Fortes, M.D.     PWW/MEDQ  D:  03/05/2014  T:  03/05/2014  Job:  [id number]

## 2014-03-05 NOTE — Op Note (Signed)
PATIENT ID:      Jacqueline Christian  MRN:     [id number] DOB/AGE:    10-22-1945 / 68 y.o.       OPERATIVE REPORT    DATE OF PROCEDURE:  03/05/2014       PREOPERATIVE DIAGNOSIS:   LEFT KNEE END STAGE OSTEOARTHRITIS                                                       Estimated body mass index is 29.79 kg/(m^2) as calculated from the following:   Height as of 02/25/14: 5\' 3"  (1.6 m).   Weight as of 07/07/11: 76.25 kg (168 lb 1.6 oz).     POSTOPERATIVE DIAGNOSIS:   LEFT KNEE END STAGE OSTEOARTHRITIS                                                                     Estimated body mass index is 29.79 kg/(m^2) as calculated from the following:   Height as of 02/25/14: 5\' 3"  (1.6 m).   Weight as of 07/07/11: 76.25 kg (168 lb 1.6 oz).     PROCEDURE:  Procedure(s): TOTAL KNEE ARTHROPLASTY left     SURGEON:  Joni Fears, MD    ASSISTANT:   Biagio Borg, PA-C   (Present and scrubbed throughout the case, critical for assistance with exposure, retraction, instrumentation, and closure.)          ANESTHESIA: spinal     DRAINS: (left knee) Hemovact drain(s) in the clamped with  Suction Clamped :      TOURNIQUET TIME: 66 minutes   COMPLICATIONS:  None   CONDITION:  stable  PROCEDURE IN DETAIL: 121975   Jacqueline Christian 03/05/2014, 12:52 PM

## 2014-03-05 NOTE — Progress Notes (Signed)
Patient ID: Jacqueline Christian, female   DOB: 09-Feb-1946, 68 y.o.   MRN: [id number] The recent History & Physical has been reviewed. I have personally examined the patient today. There is no interval change to the documented History & Physical. The patient would like to proceed with the procedure.  Joni Fears W 03/05/2014,  9:57 AM

## 2014-03-05 NOTE — Evaluation (Signed)
Physical Therapy Evaluation Patient Details Name: Jacqueline Christian MRN: [id number] DOB: [birth year] Today's Date: 03/05/2014   History of Present Illness  68 y.o. female s/p left total knee arthroplasty  Clinical Impression  Pt is s/p left TKA resulting in the deficits listed below (see PT Problem List). Able to tolerate transfer to chair post op day #0 at a min guard level for safety, with cues to maintain 50% weight-bearing on LLE. Will follow up in AM for gait and stair training. Pt will benefit from skilled PT to increase their independence and safety with mobility to allow discharge to the venue listed below.      Follow Up Recommendations Home health PT;Supervision for mobility/OOB    Equipment Recommendations  Rolling walker with 5" wheels    Recommendations for Other Services OT consult     Precautions / Restrictions Precautions Precautions: Knee Precaution Comments: Reviewed knee precautions Restrictions Weight Bearing Restrictions: Yes LLE Weight Bearing: Partial weight bearing LLE Partial Weight Bearing Percentage or Pounds: 50%      Mobility  Bed Mobility Overal bed mobility: Modified Independent             General bed mobility comments: Requires extra time  Transfers Overall transfer level: Needs assistance Equipment used: Rolling walker (2 wheeled) Transfers: Sit to/from [facility] Sit to Stand: Min guard Stand pivot transfers: Min guard       General transfer comment: min guard for safety. VC for [name] on stable surface to push up from. Performed sit<>stand from lowest bed setting and into chair after pivot transfer. Required intermittent cues and demonstration to maintain 50% weight bearing status on LLE.  Ambulation/Gait                Stairs            Wheelchair Mobility    Modified Rankin (Stroke Patients Only)       Balance Overall balance assessment: Needs assistance Sitting-balance support: No  upper extremity supported;Feet supported Sitting balance-[name]: Fair     Standing balance support: Bilateral upper extremity supported Standing balance-[name]: Poor                               Pertinent Vitals/Pain Pain Assessment: 0-10 Pain Score: 8  Pain Location: left knee Pain Descriptors / Indicators: Throbbing Pain Intervention(s): Limited activity within patient's tolerance;Monitored during session;Repositioned;RN gave pain meds during session    [place] expects to be discharged to:: Private residence Living Arrangements: Spouse/significant other Available Help at Discharge: Family;Available 24 hours/day Type of Home: House Home Access: Stairs to enter Entrance Stairs-Rails: Psychiatric nurse of Steps: 5 Home Layout: One level Home Equipment: Walker - standard;Walker - 4 wheels;Shower seat - built in;Crutches;Cane - single point (3 in 1)      Prior Function Level of Independence: Independent with assistive device(s)         Comments: used cane for ambulation     [name]   [name]: Left    Extremity/Trunk Assessment   Upper Extremity Assessment: Defer to OT evaluation           Lower Extremity Assessment: LLE deficits/detail   LLE Deficits / Details: decreased strength and ROM as expected post op - able to feel light touch sensation in toes and foot     Communication   Communication: No difficulties  Cognition Arousal/Alertness: Awake/alert Behavior During Therapy: WFL for tasks  assessed/performed Overall Cognitive Status: Within Functional Limits for tasks assessed                      General Comments      Exercises Total Joint Exercises Ankle Circles/Pumps: AROM;Both;10 reps;Seated Quad Sets: AROM;Left;10 reps;Seated      Assessment/Plan    PT Assessment Patient needs continued PT services  PT Diagnosis Difficulty walking;Acute pain   PT Problem List  Decreased strength;Decreased range of motion;Decreased activity tolerance;Decreased balance;Decreased mobility;Decreased knowledge of use of DME;Decreased knowledge of precautions;Pain  PT Treatment Interventions DME instruction;Gait training;Stair training;Functional mobility training;Therapeutic activities;Therapeutic exercise;Balance training;Neuromuscular re-education;Patient/family education;Modalities   PT Goals (Current goals can be found in the Care Plan section) Acute Rehab PT Goals Patient Stated Goal: No pain PT Goal Formulation: With patient Time For Goal Achievement: 03/12/14 Potential to Achieve Goals: Good    Frequency 7X/week   Barriers to discharge        Co-evaluation               End of Session Equipment Utilized During Treatment: Gait belt Activity Tolerance: Patient tolerated treatment well Patient left: in chair;with call bell/phone within reach;with family/visitor present Nurse Communication: Mobility status;Patient requests pain meds         Time: 1950-9326 PT Time Calculation (min): 24 min   Charges:   PT Evaluation $Initial PT Evaluation Tier I: 1 Procedure PT Treatments $Therapeutic Activity: 8-22 mins   PT G Codes:        Elayne Snare, [place]   Ellouise Newer 03/05/2014, 4:28 PM

## 2014-03-06 ENCOUNTER — Encounter (HOSPITAL_COMMUNITY): Payer: Self-pay | Admitting: Orthopaedic Surgery

## 2014-03-06 LAB — BASIC METABOLIC PANEL
ANION GAP: 10 (ref 5–15)
BUN: 11 mg/dL (ref 6–23)
CHLORIDE: 104 meq/L (ref 96–112)
CO2: 28 meq/L (ref 19–32)
Calcium: 8.4 mg/dL (ref 8.4–10.5)
Creatinine, Ser: 0.68 mg/dL (ref 0.50–1.10)
GFR calc non Af Amer: 87 mL/min — ABNORMAL LOW (ref >90)
Glucose, Bld: 125 mg/dL — ABNORMAL HIGH (ref 70–99)
Potassium: 3.1 mEq/L — ABNORMAL LOW (ref 3.7–5.3)
Sodium: 142 mEq/L (ref 137–147)

## 2014-03-06 LAB — CBC
HCT: 33.7 % — ABNORMAL LOW (ref 36.0–46.0)
Hemoglobin: 11.5 g/dL — ABNORMAL LOW (ref 12.0–15.0)
MCH: 29.5 pg (ref 26.0–34.0)
MCHC: 34.1 g/dL (ref 30.0–36.0)
MCV: 86.4 fL (ref 78.0–100.0)
PLATELETS: 151 10*3/uL (ref 150–400)
RBC: 3.9 MIL/uL (ref 3.87–5.11)
RDW: 13.8 % (ref 11.5–15.5)
WBC: 10.4 10*3/uL (ref 4.0–10.5)

## 2014-03-06 MED ORDER — POTASSIUM CHLORIDE CRYS ER 20 MEQ PO TBCR
30.0000 meq | EXTENDED_RELEASE_TABLET | Freq: Two times a day (BID) | ORAL | Status: AC
  Administered 2014-03-06 (×2): 30 meq via ORAL
  Filled 2014-03-06 (×2): qty 1

## 2014-03-06 MED ORDER — ACETAMINOPHEN 325 MG PO TABS
650.0000 mg | ORAL_TABLET | ORAL | Status: DC | PRN
  Administered 2014-03-06: 650 mg via ORAL
  Filled 2014-03-06: qty 2

## 2014-03-06 MED ORDER — ACETAMINOPHEN 500 MG PO TABS
1000.0000 mg | ORAL_TABLET | Freq: Once | ORAL | Status: AC
  Administered 2014-03-06: 1000 mg via ORAL
  Filled 2014-03-06: qty 2

## 2014-03-06 NOTE — Progress Notes (Signed)
Orthopedic Tech Progress Note Patient Details:  Jacqueline Christian 1946-05-29 [id number]  Patient ID: [name], female   DOB: 05-Aug-1945, 68 y.o.   MRN: [id number]  Placed pt's lle in cpm @0 -30 @1935 ; will increase as pt tolerates; rn notified [name] 03/06/2014, 7:32 PM

## 2014-03-06 NOTE — Care Management Note (Signed)
CARE MANAGEMENT NOTE 03/06/2014  Patient:  SHILO, PHILIPSON   Account Number:  [id number]  Date Initiated:  03/06/2014  Documentation initiated by:  Ricki Miller  Subjective/Objective Assessment:   68 yr old female admitted with osteoarthritis of left knee. Left total knee arthroplasty performed.     Action/Plan:   Patient preoperatively setup with [facility], no changes. Has family support at discharge.   Anticipated DC Date:  03/07/2014   Anticipated DC Plan:  [place]  CM consult      PAC Choice  [place]   Choice offered to / List presented to:  C-1 Patient   DME arranged  [place]      DME agency  [employer]     [place] arranged  HH-2 PT      [place] agency  [facility]   Status of service:  Completed, signed off Medicare Important Message given?  NA - LOS <3 / Initial given by admissions (If response is "NO", the following Medicare IM given date fields will be blank) Date Medicare IM given:   Medicare IM given by:   Date Additional Medicare IM given:   Additional Medicare IM given by:    Discharge Disposition:  [place]  Per UR Regulation:  Reviewed for med. necessity/level of care/duration of stay  If discussed at [place] of Stay Meetings, dates discussed:    Comments:

## 2014-03-06 NOTE — Progress Notes (Signed)
Physical Therapy Treatment Patient Details Name: Jacqueline Christian MRN: [id number] DOB: 1945/10/19 Today's Date: 03/06/2014    History of Present Illness 68 y.o. female s/p left total knee arthroplasty    PT Comments    Patient is progressing well towards physical therapy goals, ambulating up to 75 feet with supervision while using a rolling walker and safely maintains 50% weight-bearing status. Safely completed stair training this afternoon. Will follow up in AM for additional gait/exercise training however, feel she will be adequate for d/c from a mobility standpoint after 1 session tomorrow if there are no significant changes overnight.    Follow Up Recommendations  Home health PT;Supervision for mobility/OOB     Equipment Recommendations  Rolling walker with 5" wheels    Recommendations for Other Services OT consult     Precautions / Restrictions Precautions Precautions: Knee Precaution Comments: Reviewed knee precautions Restrictions Weight Bearing Restrictions: Yes LLE Weight Bearing: Partial weight bearing LLE Partial Weight Bearing Percentage or Pounds: 50%    Mobility  Bed Mobility                  Transfers Overall transfer level: Needs assistance Equipment used: Rolling walker (2 wheeled) Transfers: Sit to/from Stand Sit to Stand: Supervision         General transfer comment: Supervision for safety. Correctly placed hands on stable surface. From reclining chair x2.  Ambulation/Gait Ambulation/Gait assistance: Supervision Ambulation Distance (Feet): 75 Feet Assistive device: Rolling walker (2 wheeled) Gait Pattern/deviations: Step-to pattern;Decreased step length - right;Decreased stance time - left;Antalgic   Gait velocity interpretation: Below normal speed for age/gender General Gait Details: Demonstrating ability to safely maintain weight bearing on LLE during ambulation. Focused on upright posture with forward gaze. Intermittent VC for walker  placement.   Stairs Stairs: Yes Stairs assistance: Min assist Stair Management: No rails;Step to pattern;Backwards;With walker Number of Stairs: 2 (x2) General stair comments: Demonstrated to patient prior to having her practice. VC for sequencing and technique. Pt able to correctly verbalize sequence and technique on second trail. Safely performs this task with min assist to block walker.  Wheelchair Mobility    Modified Rankin (Stroke Patients Only)       Balance                                    Cognition Arousal/Alertness: Awake/alert Behavior During Therapy: WFL for tasks assessed/performed Overall Cognitive Status: Within Functional Limits for tasks assessed                      Exercises Total Joint Exercises Ankle Circles/Pumps: AROM;Both;10 reps;Seated Quad Sets: AROM;Left;10 reps;Seated Long Arc Quad: AROM;Both;10 reps;Seated Knee Flexion: AROM;Both;10 reps;Seated Goniometric ROM: 11-72 degrees left knee flexion    General Comments        Pertinent Vitals/Pain Pain Assessment: 0-10 Pain Score:  ("It's getting better" no value given) Pain Location: Lt knee Pain Intervention(s): Monitored during session;Repositioned    Home Living                      Prior Function            PT Goals (current goals can now be found in the care plan section) Acute Rehab PT Goals PT Goal Formulation: With patient Time For Goal Achievement: 03/12/14 Potential to Achieve Goals: Good Progress towards PT goals: Progressing toward goals    Frequency  7X/week    PT Plan Current plan remains appropriate    Co-evaluation             End of Session Equipment Utilized During Treatment: Gait belt Activity Tolerance: Patient tolerated treatment well Patient left: in chair;with call bell/phone within reach     Time: 1520-1546 PT Time Calculation (min): 26 min  Charges:  $Gait Training: 23-37 mins $Therapeutic Exercise: 8-22  mins                    G Codes:      [employer], [place]  Ellouise Newer 03/06/2014, 4:34 PM

## 2014-03-06 NOTE — Discharge Instructions (Signed)
Information on my medicine - XARELTO® (Rivaroxaban) ° °This medication education was reviewed with me or my healthcare representative as part of my discharge preparation.  The pharmacist that spoke with me during my hospital stay was:  , Capri Veals Danielle, RPH ° °Why was Xarelto® prescribed for you? °Xarelto® was prescribed for you to reduce the risk of blood clots forming after orthopedic surgery. The medical term for these abnormal blood clots is venous thromboembolism (VTE). ° °What do you need to know about xarelto® ? °Take your Xarelto® ONCE DAILY at the same time every day. °You may take it either with or without food. ° °If you have difficulty swallowing the tablet whole, you may crush it and mix in applesauce just prior to taking your dose. ° °Take Xarelto® exactly as prescribed by your doctor and DO NOT stop taking Xarelto® without talking to the doctor who prescribed the medication.  Stopping without other VTE prevention medication to take the place of Xarelto® may increase your risk of developing a clot. ° °After discharge, you should have regular check-up appointments with your healthcare provider that is prescribing your Xarelto®.   ° °What do you do if you miss a dose? °If you miss a dose, take it as soon as you remember on the same day then continue your regularly scheduled once daily regimen the next day. Do not take two doses of Xarelto® on the same day.  ° °Important Safety Information °A possible side effect of Xarelto® is bleeding. You should call your healthcare provider right away if you experience any of the following: °? Bleeding from an injury or your nose that does not stop. °? Unusual colored urine (red or dark brown) or unusual colored stools (red or black). °? Unusual bruising for unknown reasons. °? A serious fall or if you hit your head (even if there is no bleeding). ° °Some medicines may interact with Xarelto® and might increase your risk of bleeding while on Xarelto®. To help  avoid this, consult your healthcare provider or pharmacist prior to using any new prescription or non-prescription medications, including herbals, vitamins, non-steroidal anti-inflammatory drugs (NSAIDs) and supplements. ° °This website has more information on Xarelto®: www.xarelto.com. ° ° °

## 2014-03-06 NOTE — Progress Notes (Signed)
PT Cancellation Note  Patient Details Name: Jacqueline Christian MRN: [id number] DOB: 12-26-1945   Cancelled Treatment:    Reason Eval/Treat Not Completed: Patient declined, no reason specified Pt declines physical therapy at this time. States she has to bathe first before being able to participate. Will follow up at a later time.  [place], [place]   Ellouise Newer 03/06/2014, 9:27 AM

## 2014-03-06 NOTE — Progress Notes (Signed)
Patient ID: Jacqueline Christian, female   DOB: Oct 31, 1945, 68 y.o.   MRN: [id number] PATIENT ID: Jacqueline Christian        MRN:  [id number]          DOB/AGE: [birth year] / 68 y.o.    Joni Fears, MD   Biagio Borg, PA-C [address] [place], [place]  [address]                             [phone]   PROGRESS NOTE  Subjective:  negative for Chest Pain  negative for Shortness of Breath  negative for Nausea/Vomiting   negative for Calf Pain    Tolerating Diet: yes         Patient reports pain as mild and moderate.     Pain is tolerable.  Out of bed yesterday to chair  Objective: Vital signs in last 24 hours:   Patient Vitals for the past 24 hrs:  BP Temp Temp src Pulse Resp SpO2  03/06/14 0547 132/67 mmHg 99.5 F (37.5 C) Oral 77 17 93 %  03/06/14 0400 - - - - 16 96 %  03/06/14 0049 - 98.6 F (37 C) Oral 80 16 93 %  03/06/14 0000 - - - - 15 95 %  03/05/14 2004 111/49 mmHg 98.1 F (36.7 C) Oral 66 17 95 %  03/05/14 2000 - - - - 16 94 %  03/05/14 1450 151/69 mmHg 97.6 F (36.4 C) - 63 16 95 %  03/05/14 1430 135/66 mmHg 98.1 F (36.7 C) - 61 13 97 %  03/05/14 1415 139/65 mmHg - - 66 12 95 %  03/05/14 1400 151/74 mmHg - - 73 17 96 %  03/05/14 1345 139/65 mmHg - - 64 24 98 %  03/05/14 1330 132/64 mmHg - - 65 16 98 %  03/05/14 1312 116/67 mmHg 97.7 F (36.5 C) - 63 12 99 %  03/05/14 0937 - - - 75 19 100 %  03/05/14 0936 - - - 74 19 100 %  03/05/14 0935 123/50 mmHg - - 75 19 100 %  03/05/14 0934 - - - 73 17 99 %  03/05/14 0933 - - - 72 19 99 %  03/05/14 0932 - - - 71 19 98 %  03/05/14 0931 - - - 70 17 98 %  03/05/14 0930 - - - 75 12 100 %  03/05/14 0929 - - - 75 14 100 %  03/05/14 0928 - - - 72 19 99 %  03/05/14 0927 - - - 71 16 100 %  03/05/14 0926 - - - 71 11 100 %  03/05/14 0925 - - - 72 18 100 %  03/05/14 0924 - - - 71 16 100 %  03/05/14 0923 - - - 69 17 100 %  03/05/14 0922 - - - 71 15 100 %  03/05/14 0921 - - - 79 16 99 %  03/05/14 0920 - - - 68 16 100 %    03/05/14 0919 - - - 70 13 100 %  03/05/14 0918 - - - 71 14 100 %  03/05/14 0917 - - - 71 15 100 %  03/05/14 0916 - - - 71 15 100 %  03/05/14 0915 - - - 69 17 98 %  03/05/14 0830 133/61 mmHg 98.8 F (37.1 C) Oral 72 18 99 %      Intake/Output from previous day:   09/29 0701 - 09/30 0700  In: 1946.3 [P.O.:570; I.V.:1176.3] Out: 715 [Urine:300; Drains:415]   Intake/Output this shift:       Intake/Output     09/29 0701 - 09/30 0700 09/30 0701 - 10/01 0700   P.O. 570    I.V. 1176.3    IV Piggyback 200    Total Intake 1946.3     Urine 300    Drains 415    Total Output 715     Net +1231.3          Urine Occurrence 4 x      Hemovac 50 ml last shift but was disconnected by CPM placement   LABORATORY DATA:  Recent Labs  03/06/14 0526  WBC 10.4  HGB 11.5*  HCT 33.7*  PLT 151    Recent Labs  03/06/14 0526  NA 142  K 3.1*  CL 104  CO2 28  BUN 11  CREATININE 0.68  GLUCOSE 125*  CALCIUM 8.4   Lab Results  Component Value Date   INR 0.95 02/25/2014   INR 1.14 06/30/2011   INR 1.17 06/25/2011    Recent Radiographic Studies :  Chest 2 View  02/25/2014   CLINICAL DATA:  Preop for left knee replacement  EXAM: CHEST  2 VIEW  COMPARISON:  05/23/2012  FINDINGS: Cardiomediastinal silhouette is stable. No acute infiltrate or pleural effusion. No pulmonary edema. Mild degenerative changes thoracic spine.  IMPRESSION: No active cardiopulmonary disease.   Electronically Signed   By: Lahoma Crocker M.D.   On: 02/25/2014 15:57     Examination:  General appearance: alert, cooperative, mild distress and moderate distress Resp: clear to auscultation bilaterally Cardio: regular rate and rhythm GI: normal findings: bowel sounds normal Extremities: Homans sign is negative, no sign of DVT  Wound Exam: clean, dry, intact dressing  Drainage:  None: wound tissue dry  Motor Exam: EHL, FHL, Anterior Tibial and Posterior Tibial Intact  Sensory Exam: Superficial Peroneal, Deep Peroneal  and Tibial normal  Vascular Exam: Left dorsalis pedis artery has trace pulse  Assessment:    1 Day Post-Op  Procedure(s) (LRB): TOTAL KNEE ARTHROPLASTY (Left)  ADDITIONAL DIAGNOSIS:  Active Problems:   S/P total knee replacement using cement  Acute Blood Loss Anemia, Hypokalemia and Hypertension   Plan: Physical Therapy as ordered Partial Weight Bearing @ 50% (PWB)  DVT Prophylaxis:  Xarelto, Foot Pumps and TED hose  DISCHARGE PLAN: Home  DISCHARGE NEEDS: CPM, Walker and 3-in-1 comode seat  Hold triamterene-hctz Replace K+    PETRARCA,BRIAN  03/06/2014 7:41 AM

## 2014-03-06 NOTE — Progress Notes (Signed)
Physical Therapy Treatment Patient Details Name: Jacqueline Christian MRN: [id number] DOB: 09/16/1945 Today's Date: 03/06/2014    History of Present Illness 68 y.o. female s/p left total knee arthroplasty    PT Comments    Progressing well towards PT goals, ambulating up to 65 feet at a min guard level today while using a rolling walker. Tolerating exercises well and ROM 11-72 degrees of flexion this AM. Will continue to work with pt focusing on gait and stair training. Patient will continue to benefit from skilled physical therapy services to further improve independence with functional mobility.   Follow Up Recommendations  Home health PT;Supervision for mobility/OOB     Equipment Recommendations  Rolling walker with 5" wheels    Recommendations for Other Services OT consult     Precautions / Restrictions Precautions Precautions: Knee Precaution Comments: Reviewed knee precautions Restrictions Weight Bearing Restrictions: Yes LLE Weight Bearing: Partial weight bearing LLE Partial Weight Bearing Percentage or Pounds: 50%    Mobility  Bed Mobility                  Transfers Overall transfer level: Needs assistance Equipment used: Rolling walker (2 wheeled) Transfers: Sit to/from Stand Sit to Stand: Supervision         General transfer comment: Supervision for safety. Correctly placed hands on stable surface. Performed from reclining chair  Ambulation/Gait Ambulation/Gait assistance: Min guard Ambulation Distance (Feet): 65 Feet Assistive device: Rolling walker (2 wheeled) Gait Pattern/deviations: Step-to pattern;Decreased step length - right;Decreased stance time - left;Antalgic   Gait velocity interpretation: Below normal speed for age/gender General Gait Details: Educated on safe DME use with a rolling walker. VC to maintain 50% weight bearing on LLE. VC for upright posture and forward gaze. No loss of balance noted.   Stairs            Wheelchair  Mobility    Modified Rankin (Stroke Patients Only)       Balance                                    Cognition Arousal/Alertness: Awake/alert Behavior During Therapy: WFL for tasks assessed/performed Overall Cognitive Status: Within Functional Limits for tasks assessed                      Exercises Total Joint Exercises Ankle Circles/Pumps: AROM;Both;10 reps;Seated Quad Sets: AROM;Left;10 reps;Seated Long Arc Quad: AROM;Both;10 reps;Seated Knee Flexion: AROM;Both;10 reps;Seated Goniometric ROM: 11-72 degrees left knee flexion    General Comments        Pertinent Vitals/Pain Pain Assessment: 0-10 Pain Score:  ("a little better today" no value given) Pain Location: left knee Pain Intervention(s): Monitored during session;Repositioned    Home Living                      Prior Function            PT Goals (current goals can now be found in the care plan section) Acute Rehab PT Goals PT Goal Formulation: With patient Time For Goal Achievement: 03/12/14 Potential to Achieve Goals: Good Progress towards PT goals: Progressing toward goals    Frequency  7X/week    PT Plan Current plan remains appropriate    Co-evaluation             End of Session Equipment Utilized During Treatment: Gait belt Activity Tolerance: Patient tolerated treatment well  Patient left: in chair;with call bell/phone within reach     Time: 1026-1052 PT Time Calculation (min): 26 min  Charges:  $Gait Training: 8-22 mins $Therapeutic Exercise: 8-22 mins                    G Codes:      [employer], [place]  Ellouise Newer 03/06/2014, 1:36 PM

## 2014-03-07 ENCOUNTER — Inpatient Hospital Stay (HOSPITAL_COMMUNITY): Payer: Medicare Other

## 2014-03-07 DIAGNOSIS — M1712 Unilateral primary osteoarthritis, left knee: Secondary | ICD-10-CM | POA: Diagnosis present

## 2014-03-07 LAB — BASIC METABOLIC PANEL
Anion gap: 10 (ref 5–15)
BUN: 11 mg/dL (ref 6–23)
CO2: 28 mEq/L (ref 19–32)
Calcium: 8.6 mg/dL (ref 8.4–10.5)
Chloride: 105 mEq/L (ref 96–112)
Creatinine, Ser: 0.72 mg/dL (ref 0.50–1.10)
GFR calc non Af Amer: 86 mL/min — ABNORMAL LOW (ref >90)
GLUCOSE: 107 mg/dL — AB (ref 70–99)
POTASSIUM: 4.1 meq/L (ref 3.7–5.3)
SODIUM: 143 meq/L (ref 137–147)

## 2014-03-07 LAB — CBC
HEMATOCRIT: 34 % — AB (ref 36.0–46.0)
HEMOGLOBIN: 11.5 g/dL — AB (ref 12.0–15.0)
MCH: 29.7 pg (ref 26.0–34.0)
MCHC: 33.8 g/dL (ref 30.0–36.0)
MCV: 87.9 fL (ref 78.0–100.0)
Platelets: 167 10*3/uL (ref 150–400)
RBC: 3.87 MIL/uL (ref 3.87–5.11)
RDW: 14 % (ref 11.5–15.5)
WBC: 9.4 10*3/uL (ref 4.0–10.5)

## 2014-03-07 MED ORDER — TRIAMTERENE-HCTZ 37.5-25 MG PO TABS
1.0000 | ORAL_TABLET | Freq: Every day | ORAL | Status: DC
  Administered 2014-03-07: 1 via ORAL
  Filled 2014-03-07 (×2): qty 1

## 2014-03-07 MED ORDER — RIVAROXABAN 10 MG PO TABS: 10.0000 mg | ORAL_TABLET | Freq: Every day | ORAL | Status: DC

## 2014-03-07 MED ORDER — ACETAMINOPHEN 325 MG PO TABS: 650.0000 mg | ORAL_TABLET | ORAL | Status: DC | PRN

## 2014-03-07 MED ORDER — HYDROMORPHONE HCL 2 MG PO TABS: 2.0000 mg | ORAL_TABLET | ORAL | Status: DC | PRN

## 2014-03-07 NOTE — Progress Notes (Signed)
Physical Therapy Treatment Patient Details Name: MILINA PAGETT MRN: [id number] DOB: [birth year] Today's Date: 03/07/2014    History of Present Illness 68 y.o. female s/p left total knee arthroplasty    PT Comments    Patient progressing well with mobility. She is more concerned about her pain control. Will see again this afternoon and practice steps once more. Await MD decision about DC home.   Follow Up Recommendations  Home health PT;Supervision for mobility/OOB     Equipment Recommendations  Rolling walker with 5" wheels    Recommendations for Other Services       Precautions / Restrictions Precautions Precautions: Knee Precaution Comments: Reviewed knee precautions Restrictions LLE Weight Bearing: Partial weight bearing LLE Partial Weight Bearing Percentage or Pounds: 50%    Mobility  Bed Mobility Overal bed mobility: Modified Independent                Transfers Overall transfer level: Needs assistance Equipment used: Rolling walker (2 wheeled)   Sit to Stand: Supervision         General transfer comment: Supervision for safety. Correctly placed hands on stable surface.   Ambulation/Gait Ambulation/Gait assistance: Supervision Ambulation Distance (Feet): 130 Feet Assistive device: Rolling walker (2 wheeled)     Gait velocity interpretation: Below normal speed for age/gender General Gait Details: Patient with good safe use of RW. Cues for heel strike.    Stairs            Wheelchair Mobility    Modified Rankin (Stroke Patients Only)       Balance                                    Cognition Arousal/Alertness: Awake/alert Behavior During Therapy: WFL for tasks assessed/performed Overall Cognitive Status: Within Functional Limits for tasks assessed                      Exercises Total Joint Exercises Quad Sets: AROM;Left;10 reps;Seated Heel Slides: AAROM;Left;10 reps Hip ABduction/ADduction:  AAROM;Left;10 reps Straight Leg Raises: AAROM;Left;10 reps    General Comments        Pertinent Vitals/Pain Pain Score: 8  Pain Location: Lt knee Pain Descriptors / Indicators: Aching Pain Intervention(s): Monitored during session;Premedicated before session    Home Living                      Prior Function            PT Goals (current goals can now be found in the care plan section) Progress towards PT goals: Progressing toward goals    Frequency  7X/week    PT Plan Current plan remains appropriate    Co-evaluation             End of Session Equipment Utilized During Treatment: Gait belt Activity Tolerance: Patient tolerated treatment well Patient left: in chair;with call bell/phone within reach     Time: 0756-0821 PT Time Calculation (min): 25 min  Charges:  $Gait Training: 8-22 mins $Therapeutic Exercise: 8-22 mins                    G Codes:      Jacqualyn Posey 03/07/2014, 8:37 AM 03/07/2014 Jacqualyn Posey PTA [phone] pager [phone] office

## 2014-03-07 NOTE — Progress Notes (Signed)
Patient ID: Jacqueline Christian, female   DOB: [birth year], 68 y.o.   MRN: [id number] PATIENT ID: Jacqueline Christian        MRN:  [id number]          DOB/AGE: 21-Jun-1945 / 68 y.o.    Jacqueline Fears, MD   Biagio Borg, PA-C [address] [place], [place]  [address]                             [phone]   PROGRESS NOTE  Subjective:  negative for Chest Pain  negative for Shortness of Breath  negative for Nausea/Vomiting   negative for Calf Pain    Tolerating Diet: yes         Patient reports pain as moderate.     Comfortable with dilaudid  Objective: Vital signs in last 24 hours:   Patient Vitals for the past 24 hrs:  BP Temp Temp src Pulse Resp SpO2  03/07/14 1523 132/50 mmHg 97.1 F (36.2 C) Oral 94 18 98 %  03/07/14 1200 - - - - 18 98 %  03/07/14 0800 - - - - 20 97 %  03/07/14 0623 150/69 mmHg 98.6 F (37 C) Oral 79 18 97 %  03/07/14 0400 - - - - 17 96 %  03/07/14 0000 - - - - 17 96 %  03/06/14 2350 - 99.5 F (37.5 C) Oral - - -  03/06/14 2206 110/51 mmHg 101.3 F (38.5 C) Oral 86 17 96 %  03/06/14 2100 - 102 F (38.9 C) Oral - - -  03/06/14 2000 - - - - 18 99 %      Intake/Output from previous day:   09/30 0701 - 10/01 0700 In: 380 [P.O.:380] Out: 1205 [Urine:1100; Drains:105]   Intake/Output this shift:   10/01 0701 - 10/01 1900 In: 480 [P.O.:480] Out: -    Intake/Output     09/30 0701 - 10/01 0700 10/01 0701 - 10/02 0700   P.O. 380 480   I.V.     IV Piggyback     Total Intake 380 480   Urine 1100    Drains 105    Total Output 1205     Net -825 +480        Urine Occurrence 5 x 4 x      LABORATORY DATA:  Recent Labs  03/06/14 0526 03/07/14 0635  WBC 10.4 9.4  HGB 11.5* 11.5*  HCT 33.7* 34.0*  PLT 151 167    Recent Labs  03/06/14 0526 03/07/14 0635  NA 142 143  K 3.1* 4.1  CL 104 105  CO2 28 28  BUN 11 11  CREATININE 0.68 0.72  GLUCOSE 125* 107*  CALCIUM 8.4 8.6   Lab Results  Component Value Date   INR 0.95 02/25/2014   INR  1.14 06/30/2011   INR 1.17 06/25/2011    Recent Radiographic Studies :  Dg Chest 2 View  03/07/2014   CLINICAL DATA:  Fever postoperative ; recent knee surgery  EXAM: CHEST  2 VIEW  COMPARISON:  February 25, 2014  FINDINGS: The degree of inspiration is shallow. No edema or consolidation. Heart size and pulmonary vascularity are normal. No adenopathy. There is mild degenerative change in the thoracic spine.  IMPRESSION: No edema or consolidation.   Electronically Signed   By: Lowella Grip M.D.   On: 03/07/2014 12:57   Chest 2 View  02/25/2014   CLINICAL DATA:  Preop for left knee replacement  EXAM: CHEST  2 VIEW  COMPARISON:  05/23/2012  FINDINGS: Cardiomediastinal silhouette is stable. No acute infiltrate or pleural effusion. No pulmonary edema. Mild degenerative changes thoracic spine.  IMPRESSION: No active cardiopulmonary disease.   Electronically Signed   By: Lahoma Crocker M.D.   On: 02/25/2014 15:57   Dg Knee Complete 4 Views Left  03/07/2014   CLINICAL DATA:  Postoperative for knee surgery. Knee pain and fever.  EXAM: LEFT KNEE - COMPLETE 4+ VIEW  COMPARISON:  03/08/2013  FINDINGS: No complicating feature related to the total knee prosthesis is radiographically apparent. A drain is in place along with anterior skin staples. Small amount of gas in the soft tissues the noted, expected in the postoperative setting.  IMPRESSION: 1. No radiographic abnormality observed, status post total knee prosthesis placement.   Electronically Signed   By: Sherryl Barters M.D.   On: 03/07/2014 12:58     Examination:  General appearance: alert, cooperative and no distress  Wound Exam: clean, dry, intact   Drainage:  Scant/small amount Serosanguinous exudate  Motor Exam: EHL, FHL, Anterior Tibial and Posterior Tibial Intact  Sensory Exam: Superficial Peroneal, Deep Peroneal and Tibial normal  Vascular Exam: Normal  Assessment:    2 Days Post-Op  Procedure(s) (LRB): TOTAL KNEE ARTHROPLASTY  (Left)  ADDITIONAL DIAGNOSIS:  Active Problems:   S/P total knee replacement using cement  no new problems-hypokalemia resolved   Plan: Physical Therapy as ordered Partial Weight Bearing @ 50% (PWB)  DVT Prophylaxis:  Xarelto and TED hose  DISCHARGE PLAN: Home  DISCHARGE NEEDS: HHPT, CPM, Walker and 3-in-1 comode seat hemovac removed with minimal drainage, good effort in PT, pain controlled with dilaudid,films left knee without complications-will plan on discharge       Jacqueline Christian W  03/07/2014 5:05 PM

## 2014-03-07 NOTE — Progress Notes (Signed)
Physical Therapy Treatment Patient Details Name: Jacqueline Christian MRN: [id number] DOB: May 09, 1946 Today's Date: 03/07/2014    History of Present Illness 68 y.o. female s/p left total knee arthroplasty    PT Comments    Patient agreeable to ambulation despite pain. Limited by pain this session. Will plan on steps in AM prior to DC home.   Follow Up Recommendations  Home health PT;Supervision for mobility/OOB     Equipment Recommendations  Rolling walker with 5" wheels    Recommendations for Other Services       Precautions / Restrictions Precautions Precautions: Knee Restrictions LLE Weight Bearing: Partial weight bearing LLE Partial Weight Bearing Percentage or Pounds: 50%    Mobility  Bed Mobility Overal bed mobility: Modified Independent                Transfers Overall transfer level: Needs assistance Equipment used: Rolling walker (2 wheeled)   Sit to Stand: Supervision         General transfer comment: Supervision for safety. Cues for Winiecki placement  Ambulation/Gait Ambulation/Gait assistance: Supervision Ambulation Distance (Feet): 100 Feet Assistive device: Rolling walker (2 wheeled) Gait Pattern/deviations: Step-to pattern;Decreased step length - right;Decreased stance time - left   Gait velocity interpretation: Below normal speed for age/gender General Gait Details: Patient with good safe use of RW. Cues for heel strike. limited by pain and fatigue   Stairs            Wheelchair Mobility    Modified Rankin (Stroke Patients Only)       Balance                                    Cognition Arousal/Alertness: Awake/alert Behavior During Therapy: WFL for tasks assessed/performed Overall Cognitive Status: Within Functional Limits for tasks assessed                      Exercises      General Comments        Pertinent Vitals/Pain Pain Score: 8  Pain Location: L knee Pain Descriptors / Indicators:  Aching Pain Intervention(s): Premedicated before session    Home Living                      Prior Function            PT Goals (current goals can now be found in the care plan section) Progress towards PT goals: Progressing toward goals    Frequency  7X/week    PT Plan Current plan remains appropriate    Co-evaluation             End of Session Equipment Utilized During Treatment: Gait belt Activity Tolerance: Patient limited by fatigue;Patient limited by pain Patient left: in chair;with call bell/phone within reach     Time: 1420-1438 PT Time Calculation (min): 18 min  Charges:  $Gait Training: 8-22 mins                    G Codes:      Jacqualyn Posey 03/07/2014, 3:09 PM 03/07/2014 Jacqualyn Posey PTA [phone] pager [phone] office

## 2014-03-07 NOTE — Discharge Summary (Signed)
Joni Fears, MD   Biagio Borg, PA-C [address], [place], [place]  [address]                             [phone]  PATIENT ID: Jacqueline Christian        MRN:  [id number]          DOB/AGE: 07/07/1945 / 68 y.o.    DISCHARGE SUMMARY  ADMISSION DATE:    03/05/2014 DISCHARGE DATE:   03/07/2014   ADMISSION DIAGNOSIS: LEFT KNEE END STAGE OSTEOARTHRITIS    DISCHARGE DIAGNOSIS:  LEFT KNEE END STAGE OSTEOARTHRITIS    ADDITIONAL DIAGNOSIS: Principal Problem:   Osteoarthritis of left knee Active Problems:   S/P total knee replacement using cement  Past Medical History  Diagnosis Date  . Recurrent sinus infections     just finished erythromycin dosing 06/18/11  . Hypertension   . Bladder spasms     takes oxybutinin  . Headache(784.0)   . Dizziness   . Vision changes     r/t brain tumor  . Arthritis   . Depression     situational  . Neoplasm of brain   . Pneumonia     hx  . UTI (lower urinary tract infection)     PROCEDURE: Procedure(s): TOTAL KNEE ARTHROPLASTY Left on 03/05/2014  CONSULTS: none     HISTORY: Jacqueline Christian is a very pleasant, 68 year old, white female who is seen today for evaluation of her left knee. Her symptoms started back in September 2014 with pain in the left knee and with popping and giving way of the knee without any history of injury or trauma. It had gotten to the point where she had been limping and having difficulty with ambulation. At that time, x-rays showed some mild degenerative changes. It was felt that she might have some internal derangement and synovitis, so she was given a corticosteroid injection. She returned back about a month later, and it was noted that the cortisone had made a very good difference for her. She did have an MRI scan that showed that the body of the medial meniscus had diffusely diminutive irregular margins, and in the absence of prior surgery, it was consistent with chronic tears. The lateral meniscus was a discoid  configuration. She was taken to the operating room on October 16th and underwent a partial medial and lateral meniscectomy. She did have grade 2-3 changes in the patellofemoral area. There was some loose articular cartilage which was debrided. The medial compartment revealed some grade 3 and some grade 2 diffuse changes. Laterally, she had a large discoid meniscus and tearing in the posterior horn, which had been debrided. These were mild grade 2 changes that were then noted. She did well over several months post surgery. She still had some aches and pain but certainly had good improvement. At 3 months post surgery, it was better than before, but she still was having symptoms. A corticosteroid injection was given in January 2015, and a series of 3 Euflexxa injections were then given. The injections certainly had benefit for her. She returned back in May and was having trouble with recurrent pain, and again a corticosteroid injection was given, which again made a huge difference in her symptoms. With increasing pain and discomfort, we have already tried that of Euflexxa and cortisone injections, but she still continues to have pain and discomfort to the point where it is bothering her on a daily basis. She  is having difficulty with her activities of daily living. Again, she has tried corticosteroid, she has tried viscosupplementation, and she has tried oral medications over the counter that of anti-inflammatories. She basically is now to the point that she cannot tolerate having this pain and discomfort   HOSPITAL COURSE:  Jacqueline Christian is a 68 y.o. admitted on 03/05/2014 and found to have a diagnosis of [place].  After appropriate laboratory studies were obtained  they were taken to the operating room on 03/05/2014 and underwent  Procedure(s): TOTAL KNEE ARTHROPLASTY  Left.   They were given perioperative antibiotics:  Anti-infectives   Start     Dose/Rate Route Frequency Ordered  Stop   03/05/14 1530  ceFAZolin (ANCEF) IVPB 2 g/50 mL premix     2 g 100 mL/hr over 30 Minutes Intravenous Every 6 hours 03/05/14 1459 03/05/14 2122   03/05/14 0900  vancomycin (VANCOCIN) IVPB 1000 mg/200 mL premix     1,000 mg 200 mL/hr over 60 Minutes Intravenous  Once 03/05/14 0853 03/05/14 1130   03/05/14 0858  vancomycin (VANCOCIN) 1 GM/200ML IVPB    Comments:  Henrine Screws   : cabinet override      03/05/14 0858 03/05/14 2059   03/05/14 0600  ceFAZolin (ANCEF) IVPB 2 g/50 mL premix  Status:  Discontinued     2 g 100 mL/hr over 30 Minutes Intravenous On call to O.R. 02/26/14 1404 02/26/14 1409    .  Tolerated the procedure well.  Placed with a foley intraoperatively.    Toradol was given post op.  POD #1, allowed out of bed to a chair.  PT for ambulation and exercise program.  Foley D/C'd in morning.  IV saline locked.  O2 discontionued.  POD #2, continued PT and ambulation.   Hemovac pulled. . The remainder of the hospital course was dedicated to ambulation and strengthening.   The patient was discharged on 2 Days Post-Op in  Stable condition.  Blood products given:none  DIAGNOSTIC STUDIES: Recent vital signs: Patient Vitals for the past 24 hrs:  BP Temp Temp src Pulse Resp SpO2  03/07/14 1600 - - - - 18 98 %  03/07/14 1523 132/50 mmHg 97.1 F (36.2 C) Oral 94 18 98 %  03/07/14 1200 - - - - 18 98 %  03/07/14 0800 - - - - 20 97 %  03/07/14 0623 150/69 mmHg 98.6 F (37 C) Oral 79 18 97 %  03/07/14 0400 - - - - 17 96 %  03/07/14 0000 - - - - 17 96 %  03/06/14 2350 - 99.5 F (37.5 C) Oral - - -  03/06/14 2206 110/51 mmHg 101.3 F (38.5 C) Oral 86 17 96 %  03/06/14 2100 - 102 F (38.9 C) Oral - - -  03/06/14 2000 - - - - 18 99 %       Recent laboratory studies:  Recent Labs  03/06/14 0526 03/07/14 0635  WBC 10.4 9.4  HGB 11.5* 11.5*  HCT 33.7* 34.0*  PLT 151 167    Recent Labs  03/06/14 0526 03/07/14 0635  NA 142 143  K 3.1* 4.1  CL 104 105  CO2 28  28  BUN 11 11  CREATININE 0.68 0.72  GLUCOSE 125* 107*  CALCIUM 8.4 8.6   Lab Results  Component Value Date   INR 0.95 02/25/2014   INR 1.14 06/30/2011   INR 1.17 06/25/2011     Recent Radiographic Studies :  Dg Chest  2 View  03/07/2014   CLINICAL DATA:  Fever postoperative ; recent knee surgery  EXAM: CHEST  2 VIEW  COMPARISON:  February 25, 2014  FINDINGS: The degree of inspiration is shallow. No edema or consolidation. Heart size and pulmonary vascularity are normal. No adenopathy. There is mild degenerative change in the thoracic spine.  IMPRESSION: No edema or consolidation.   Electronically Signed   By: Lowella Grip M.D.   On: 03/07/2014 12:57   Chest 2 View  02/25/2014   CLINICAL DATA:  Preop for left knee replacement  EXAM: CHEST  2 VIEW  COMPARISON:  05/23/2012  FINDINGS: Cardiomediastinal silhouette is stable. No acute infiltrate or pleural effusion. No pulmonary edema. Mild degenerative changes thoracic spine.  IMPRESSION: No active cardiopulmonary disease.   Electronically Signed   By: Lahoma Crocker M.D.   On: 02/25/2014 15:57   Dg Knee Complete 4 Views Left  03/07/2014   CLINICAL DATA:  Postoperative for knee surgery. Knee pain and fever.  EXAM: LEFT KNEE - COMPLETE 4+ VIEW  COMPARISON:  03/08/2013  FINDINGS: No complicating feature related to the total knee prosthesis is radiographically apparent. A drain is in place along with anterior skin staples. Small amount of gas in the soft tissues the noted, expected in the postoperative setting.  IMPRESSION: 1. No radiographic abnormality observed, status post total knee prosthesis placement.   Electronically Signed   By: Sherryl Barters M.D.   On: 03/07/2014 12:58    DISCHARGE INSTRUCTIONS: Discharge Instructions   CPM    Complete by:  As directed   Continuous passive motion machine (CPM):      Use the CPM from 0 to 60 for 6-8 hours per day.      You may increase by 5-10 per day.  You may break it up into 2 or 3 sessions per  day.      Use CPM for 3-4 weeks or until you are told to stop.     Call MD / Call 911    Complete by:  As directed   If you experience chest pain or shortness of breath, CALL 911 and be transported to the hospital emergency room.  If you develop a fever above 101 F, pus (white drainage) or increased drainage or redness at the wound, or calf pain, call your surgeon's office.     Change dressing    Complete by:  As directed   Change dressing on SUNDAY, then change the dressing daily with sterile 4 x 4 inch gauze dressing and apply TED hose.  You may clean the incision with alcohol prior to redressing.     Constipation Prevention    Complete by:  As directed   Drink plenty of fluids.  Prune juice and/or coffee may be helpful.  You may use a stool softener, such as Colace (over the counter) 100 mg twice a day.  Use MiraLax (over the counter) for constipation as needed but this may take several days to work.  Mag Citrate --OR-- Milk of Magnesia --OR -- Dulcolax pills/suppositories may also be used but follow directions on the label.     Diet general    Complete by:  As directed      Discharge instructions    Complete by:  As directed   YOU WERE GIVEN A DEVICE CALLED AN INCENTIVE SPIROMETER TO HELP YOU TAKE DEEP BREATHS.  PLEASE USE THIS AT LEAST TEN (10) TIMES EVERY 1-2 HOURS EVERY DAY TO PREVENT PNEUMONIA.  Do not put a pillow under the knee. Place it under the heel.    Complete by:  As directed      Driving restrictions    Complete by:  As directed   No driving for 6 weeks     Increase activity slowly as tolerated    Complete by:  As directed      Lifting restrictions    Complete by:  As directed   No lifting for 6 weeks     Partial weight bearing    Complete by:  As directed   50 % WEIGHT BEARING AS TAUGHT IN PHYSICAL THERAPY     Patient may shower    Complete by:  As directed   You may shower over the brown dressing.  Once the dressing is removed you may shower without a dressing  once there is no drainage.  Do not wash over the wound.  If drainage remains, cover wound with plastic wrap and then shower.     TED hose    Complete by:  As directed   Use stockings (TED hose) for 2 weeks on operative leg(s).  You may remove them at night for sleeping.           DISCHARGE MEDICATIONS:     Medication List    STOP taking these medications       CO Q 10 PO     traMADol 50 MG tablet  Commonly known as:  ULTRAM      TAKE these medications       acetaminophen 325 MG tablet  Commonly known as:  TYLENOL  Take 2 tablets (650 mg total) by mouth every 4 (four) hours as needed for mild pain, moderate pain or fever.     cetirizine 10 MG tablet  Commonly known as:  ZYRTEC  Take 10 mg by mouth daily as needed for allergies.     HYDROmorphone 2 MG tablet  Commonly known as:  DILAUDID  Take 1-2 tablets (2-4 mg total) by mouth every 4 (four) hours as needed for moderate pain or severe pain.     MULTIVITAMIN PO  Take 1 tablet by mouth daily.     oxybutynin 5 MG tablet  Commonly known as:  DITROPAN  Take 5 mg by mouth 2 (two) times daily.     rivaroxaban 10 MG Tabs tablet  Commonly known as:  XARELTO  Take 1 tablet (10 mg total) by mouth daily with breakfast.     tiZANidine 2 MG tablet  Commonly known as:  ZANAFLEX  Take 2 mg by mouth every 6 (six) hours as needed for muscle spasms.     triamterene-hydrochlorothiazide 37.5-25 MG per tablet  Commonly known as:  MAXZIDE-25  Take 1 tablet by mouth daily.     venlafaxine 75 MG tablet  Commonly known as:  EFFEXOR  Take 75 mg by mouth 2 (two) times daily.        FOLLOW UP VISIT:       Follow-up Information   Follow up with [facility]. (Someone from [facility] will contact you concerning start date and time for physical therapy.)    Contact information:   [address] [place] [address] [phone]       Follow up with Garald Balding, MD. Schedule an appointment as  soon as possible for a visit on 03/20/2014.   Specialty:  Orthopedic Surgery   Contact information:   [address]  [place] [address] [phone]       DISPOSITION:   Home  CONDITION:  Stable   Aaron Edelman D. [place], [place] [phone]  03/07/2014 5:43 PM

## 2014-12-25 ENCOUNTER — Ambulatory Visit (INDEPENDENT_AMBULATORY_CARE_PROVIDER_SITE_OTHER): Payer: Self-pay | Admitting: Neurology

## 2014-12-25 ENCOUNTER — Encounter: Payer: Self-pay | Admitting: Neurology

## 2014-12-25 ENCOUNTER — Ambulatory Visit (INDEPENDENT_AMBULATORY_CARE_PROVIDER_SITE_OTHER): Payer: Medicare Other | Admitting: Neurology

## 2014-12-25 DIAGNOSIS — M545 Low back pain, unspecified: Secondary | ICD-10-CM

## 2014-12-25 DIAGNOSIS — Z96652 Presence of left artificial knee joint: Secondary | ICD-10-CM

## 2014-12-25 DIAGNOSIS — G8929 Other chronic pain: Secondary | ICD-10-CM

## 2014-12-25 NOTE — Progress Notes (Signed)
Please refer to EMG and NCV procedure note. 

## 2014-12-25 NOTE — Procedures (Signed)
HISTORY:  Jacqueline Christian is a 69 year old patient with a history of low back pain that has worsened since September 2015. She has back pain that radiates down both legs to the feet. The problem began following a left total knee replacement. Initially the left leg was involved, but now both legs are affected. An epidural steroid injection offered no benefit. The patient is being evaluated for a possible neuropathy or a lumbar radiculopathy.  NERVE CONDUCTION STUDIES:  Nerve conduction studies were performed on both lower extremities. The distal motor latencies and motor amplitudes for the posterior tibial nerves were within normal limits. The distal motor latencies for the peroneal nerves were normal, but with low motor amplitudes for these nerves bilaterally. The nerve conduction velocities for these nerves were also normal. The H reflex latencies were normal. The sensory latencies for the peroneal nerves were within normal limits.   EMG STUDIES:  EMG study was performed on the right lower extremity:  The tibialis anterior muscle reveals 2 to 4K motor units with full recruitment. No fibrillations or positive waves were seen. The peroneus tertius muscle reveals 2 to 4K motor units with full recruitment. No fibrillations or positive waves were seen. The medial gastrocnemius muscle reveals 1 to 3K motor units with full recruitment. No fibrillations or positive waves were seen. The vastus lateralis muscle reveals 2 to 4K motor units with full recruitment. No fibrillations or positive waves were seen. The iliopsoas muscle reveals 2 to 4K motor units with full recruitment. No fibrillations or positive waves were seen. The biceps femoris muscle (long head) reveals 2 to 4K motor units with full recruitment. No fibrillations or positive waves were seen. The lumbosacral paraspinal muscles were tested at 3 levels, and revealed no abnormalities of insertional activity at all 3 levels tested. There was  good relaxation.  EMG study was performed on the left lower extremity:  The tibialis anterior muscle reveals 2 to 4K motor units with full recruitment. No fibrillations or positive waves were seen. The peroneus tertius muscle reveals 2 to 4K motor units with full recruitment. No fibrillations or positive waves were seen. The medial gastrocnemius muscle reveals 1 to 3K motor units with full recruitment. No fibrillations or positive waves were seen. The vastus lateralis muscle reveals 2 to 4K motor units with full recruitment. No fibrillations or positive waves were seen. The iliopsoas muscle reveals 2 to 4K motor units with full recruitment. No fibrillations or positive waves were seen. The biceps femoris muscle (long head) reveals 2 to 4K motor units with full recruitment. No fibrillations or positive waves were seen. The lumbosacral paraspinal muscles were tested at 3 levels, and revealed no abnormalities of insertional activity at upper and middle levels tested. 1+ positive waves were seen in the lower level. There was good relaxation.   IMPRESSION:  Nerve conduction studies done on both lower extremities reveal evidence of low motor amplitudes for the peroneal nerves with sensory sparing. The clinical significance of this is unclear, but may represent some distal dysfunction of this nerve bilaterally. EMG evaluation of the right lower extremity was unremarkable, no evidence of a peroneal neuropathy or lumbar radiculopathy was seen. EMG evaluation of the left lower extremity revealed only isolated mild acute denervation of the lower paraspinal muscles, no abnormalities were seen in the left leg. The clinical significance of this is not clear, this may represent a low-grade lumbar radiculopathy of indeterminate level, clinical correlation is required.  Jill Alexanders MD 12/25/2014 1:00 PM  [facility] [address] [place], [place] [address]  Phone  [phone] Fax [phone]

## 2014-12-30 ENCOUNTER — Telehealth: Payer: Self-pay | Admitting: Neurology

## 2014-12-30 NOTE — Telephone Encounter (Signed)
Patient called and requested to speak with the nurse regarding her apt tomorrow. She would like to know if she needs to come in for her apt tomorrow. Please call and advise.

## 2014-12-30 NOTE — Telephone Encounter (Signed)
I called the patient. I explained that Dr. Jannifer Franklin does want to see her tomorrow. Since the EMG/NVC came back normal, he needs to explore other possible reasons for her pain. She verbalized understanding and will be here tomorrow.

## 2014-12-30 NOTE — Telephone Encounter (Signed)
Patient called regarding appt for tomorrow. She states Dr Durward Fortes has results of NCV/EMG and she has an appt with him on Wed (7/27). She doesn't know if she should keep the appt with Dr Jannifer Franklin (the referral states "and consult for uncommon bil leg pain) or go with Dr Durward Fortes. Please call and advise. Patient can be reached at [phone] and [phone].

## 2014-12-31 ENCOUNTER — Telehealth: Payer: Self-pay | Admitting: Neurology

## 2014-12-31 ENCOUNTER — Ambulatory Visit (INDEPENDENT_AMBULATORY_CARE_PROVIDER_SITE_OTHER): Payer: Medicare Other | Admitting: Neurology

## 2014-12-31 ENCOUNTER — Encounter: Payer: Self-pay | Admitting: Neurology

## 2014-12-31 VITALS — BP 111/65 | HR 69 | Ht 63.0 in | Wt 174.6 lb

## 2014-12-31 DIAGNOSIS — E538 Deficiency of other specified B group vitamins: Secondary | ICD-10-CM | POA: Diagnosis not present

## 2014-12-31 DIAGNOSIS — R5382 Chronic fatigue, unspecified: Secondary | ICD-10-CM | POA: Diagnosis not present

## 2014-12-31 DIAGNOSIS — R413 Other amnesia: Secondary | ICD-10-CM | POA: Diagnosis not present

## 2014-12-31 DIAGNOSIS — M797 Fibromyalgia: Secondary | ICD-10-CM | POA: Diagnosis not present

## 2014-12-31 HISTORY — DX: Other amnesia: R41.3

## 2014-12-31 MED ORDER — DULOXETINE HCL 30 MG PO CPEP: 30.0000 mg | ORAL_CAPSULE | Freq: Two times a day (BID) | ORAL | Status: DC

## 2014-12-31 NOTE — Telephone Encounter (Signed)
I called the patient. She apparently is already on Cymbalta taking 60 mg twice daily. At this point, I would add gabapentin or Lyrica to her regimen, she will contact our office if she is amenable to this.

## 2014-12-31 NOTE — Progress Notes (Signed)
Reason for visit: Neuromuscular discomfort  Referring physician: Dr. Damien Fusi is a 69 y.o. female  History of present illness:  Ms. Berch is a 69 year old left-handed white female with a history of degenerative arthritis affecting the knees. The patient has had bilateral total knee replacements, but she continues to have discomfort in both legs. She reports an 18 month history of discomfort that begins at the mid thigh level on the left and goes down to the foot. Eventually, she began to have similar complaints on the right side. She also has pain in the low back that tracks up the spine, and goes into the shoulders bilaterally and neck. She is also having some discomfort in the upper arms bilaterally. She indicates that the pain will migrate, sometimes worse in one area than the other. She also has had a parallel issues with chronic fatigue that limits her ability to perform physical activity day to day. The patient denies any true weakness of the extremities and she denies any numbness of the extremities. She does have some urinary urgency issues. She has a history of a meningioma resection in the past, previously seen by Dr. Vertell Limber. She reports some problems with memory that has been progressive over the last several months. She also has developed some arthritis affecting the joints in the fingers of the hands. She has undergone injections in the low back without benefit. She is on Cymbalta taking 60 mg twice daily without much benefit. She is sent to this office for an evaluation. Recent EMG and nerve conduction studies were not revealing as to the source of the bilateral leg discomfort.  Past Medical History  Diagnosis Date  . Recurrent sinus infections     just finished erythromycin dosing 06/18/11  . Hypertension   . Bladder spasms     takes oxybutinin  . Headache(784.0)   . Dizziness   . Vision changes     r/t brain tumor  . Arthritis   . Depression    situational  . Neoplasm of brain   . Pneumonia     hx  . UTI (lower urinary tract infection)   . Memory difficulties 12/31/2014    Past Surgical History  Procedure Laterality Date  . Total knee arthroplasty      R  . Abdominal hysterectomy    . Cystectomy      L wrist  . Stapedes surgery      repalced stapes  . Eye surgery  1977    retina repair  . Appendectomy    . Craniotomy  06/22/2011    Procedure: CRANIOTOMY TUMOR EXCISION;  Surgeon: Peggyann Shoals, MD;  Location: [facility];  Service: Neurosurgery;  Laterality: Right;  RIGHT pterional Craniotomy for meningioma  . Craniotomy  06/22/2011    Procedure: CRANIOTOMY HEMATOMA EVACUATION SUBDURAL;  Surgeon: Peggyann Shoals, MD;  Location: [facility];  Service: Neurosurgery;  Laterality: Right;  . Total knee arthroplasty Left 03/05/2014    Procedure: TOTAL KNEE ARTHROPLASTY;  Surgeon: Garald Balding, MD;  Location: [facility];  Service: Orthopedics;  Laterality: Left;    Family History  Problem Relation Age of Onset  . Colon cancer Mother   . Stroke Father   . Heart attack Paternal Aunt   . Heart attack Paternal Uncle   . Heart attack Paternal Grandmother   . Heart attack Paternal Grandfather   . Healthy Brother   . Cancer - Lung Sister  Social history:  reports that she has never smoked. She does not have any smokeless tobacco history on file. She reports that she does not drink alcohol or use illicit drugs.  Medications:  Prior to Admission medications   Medication Sig Start Date End Date Taking? Authorizing Provider  cetirizine (ZYRTEC) 10 MG tablet Take 10 mg by mouth daily as needed for allergies.    Yes Historical Provider, MD  DULoxetine (CYMBALTA) 30 MG capsule Take 30 mg by mouth daily.   Yes Historical Provider, MD  ibuprofen (ADVIL,MOTRIN) 600 MG tablet Take 600 mg by mouth every 6 (six) hours as needed.   Yes Historical Provider, MD  Multiple Vitamins-Minerals (MULTIVITAMIN PO) Take 1 tablet by mouth daily.    Yes Historical Provider, MD  oxybutynin (DITROPAN) 5 MG tablet Take 5 mg by mouth 2 (two) times daily.   Yes Historical Provider, MD  oxyCODONE-acetaminophen (PERCOCET) 10-325 MG per tablet Take 1 tablet by mouth every 4 (four) hours as needed for pain.   Yes Historical Provider, MD  potassium chloride SA (K-DUR,KLOR-CON) 20 MEQ tablet Take 20 mEq by mouth daily.   Yes Historical Provider, MD  tiZANidine (ZANAFLEX) 2 MG tablet Take 2 mg by mouth every 6 (six) hours as needed for muscle spasms.   Yes Historical Provider, MD  triamterene-hydrochlorothiazide (MAXZIDE-25) 37.5-25 MG per tablet Take 1 tablet by mouth daily.   Yes Historical Provider, MD      Allergies  Allergen Reactions  . Morphine And Related Other (See Comments)    Pt says it felt like her head was coming off.    ROS:  Out of a complete 14 system review of symptoms, the patient complains only of the following symptoms, and all other reviewed systems are negative.  Decreased activity level, weight gain Hearing loss, ringing in the ears Environmental allergies Joint pain, back pain, achy muscles, walking difficulties, neck pain, neck stiffness Memory loss Depression  Blood pressure 111/65, pulse 69, height 5\' 3"  (1.6 m), weight 174 lb 9.6 oz (79.198 kg).  Physical Exam  General: The patient is alert and cooperative at the time of the examination. The patient is moderately obese.  Eyes: Pupils are equal, round, and reactive to light. Discs are flat bilaterally.  Neck: The neck is supple, no carotid bruits are noted.  Respiratory: The respiratory examination is clear.  Cardiovascular: The cardiovascular examination reveals a regular rate and rhythm, no obvious murmurs or rubs are noted.  Skin: Extremities are without significant edema.  Neurologic Exam  Mental status: The patient is alert and oriented x 3 at the time of the examination. The patient has apparent normal recent and remote memory, with an apparently  normal attention span and concentration ability. Mini-Mental Status Examination done today shows a total score 26/30. The patient is able to name 10 animals in 30 seconds.  Cranial nerves: Facial symmetry is present. There is good sensation of the face to pinprick and soft touch bilaterally. The strength of the facial muscles and the muscles to head turning and shoulder shrug are normal bilaterally. Speech is well enunciated, no aphasia or dysarthria is noted. Extraocular movements are full. Visual fields are full. The tongue is midline, and the patient has symmetric elevation of the soft palate. No obvious hearing deficits are noted.  Motor: The motor testing reveals 5 over 5 strength of all 4 extremities. Good symmetric motor tone is noted throughout.  Sensory: Sensory testing is intact to pinprick, soft touch, vibration sensation, and position sense  on all 4 extremities. No evidence of extinction is noted.  Coordination: Cerebellar testing reveals good finger-nose-finger and heel-to-shin bilaterally.  Gait and station: Gait is normal. Tandem gait is minimally unsteady. Romberg is negative. No drift is seen.  Reflexes: Deep tendon reflexes are symmetric and normal bilaterally. Toes are downgoing bilaterally.   Assessment/Plan:  1. Diffuse neuromuscular discomfort, possible fibromyalgia  2. Reported memory disturbance  3. Chronic fatigue  The patient is having new symptoms of diffuse neuromuscular discomfort that appears to be migratory in nature, associated with a chronic fatigue syndrome. The patient could have fibromyalgia. We will set the patient up for blood work to look for connective tissue disorders that may mimic fibromyalgia. The patient will also have blood work to evaluate the memory disturbance, and MRI of the brain will be done for this reason. She will follow-up in 3-4 months. The patient may be tried on gabapentin or Lyrica in the future. We will follow the memory over time,  medications for memory may be added in the future.  Jill Alexanders MD 12/31/2014 7:38 PM  [facility] [address] [place], [place] [address]  Phone [phone] Fax [phone]

## 2014-12-31 NOTE — Telephone Encounter (Signed)
I called back and spoke with the patient.  She is currently taking Cymbalta 60mg  twice daily.  Says she was mistaken when she said she was taking 30mg  once daily.  Indicates she has plenty of this medication on [facility].  Advised I will forward message to provider regarding this.

## 2014-12-31 NOTE — Patient Instructions (Addendum)
   We will check blood work today looking for things that can cause fatigue and neuromuscular pain. Given the memory disturbance, we will check MRI evaluation of the brain, we will also go up on the Cymbalta taking 30 mg twice daily. We will follow-up in about 4 months. If you are having any new issues, please call our office.  Fibromyalgia Fibromyalgia is a disorder that is often misunderstood. It is associated with muscular pains and tenderness that comes and goes. It is often associated with fatigue and sleep disturbances. Though it tends to be long-lasting, fibromyalgia is not life-threatening. CAUSES  The exact cause of fibromyalgia is unknown. People with certain gene types are predisposed to developing fibromyalgia and other conditions. Certain factors can play a role as triggers, such as:  Spine disorders.  Arthritis.  Severe injury (trauma) and other physical stressors.  Emotional stressors. SYMPTOMS   The main symptom is pain and stiffness in the muscles and joints, which can vary over time.  Sleep and fatigue problems. Other related symptoms may include:  Bowel and bladder problems.  Headaches.  Visual problems.  Problems with odors and noises.  Depression or mood changes.  Painful periods (dysmenorrhea).  Dryness of the skin or eyes. DIAGNOSIS  There are no specific tests for diagnosing fibromyalgia. Patients can be diagnosed accurately from the specific symptoms they have. The diagnosis is made by determining that nothing else is causing the problems. TREATMENT  There is no cure. Management includes medicines and an active, healthy lifestyle. The goal is to enhance physical fitness, decrease pain, and improve sleep. HOME CARE INSTRUCTIONS   Only take over-the-counter or prescription medicines as directed by your caregiver. Sleeping pills, tranquilizers, and pain medicines may make your problems worse.  Low-impact aerobic exercise is very important and advised  for treatment. At first, it may seem to make pain worse. Gradually increasing your tolerance will overcome this feeling.  Learning relaxation techniques and how to control stress will help you. Biofeedback, visual imagery, hypnosis, muscle relaxation, yoga, and meditation are all options.  Anti-inflammatory medicines and physical therapy may provide short-term help.  Acupuncture or massage treatments may help.  Take muscle relaxant medicines as suggested by your caregiver.  Avoid stressful situations.  Plan a healthy lifestyle. This includes your diet, sleep, rest, exercise, and friends.  Find and practice a hobby you enjoy.  Join a fibromyalgia support group for interaction, ideas, and sharing advice. This may be helpful. SEEK MEDICAL CARE IF:  You are not having good results or improvement from your treatment. FOR MORE INFORMATION  National Fibromyalgia Association: www.fmaware.[place]: www.arthritis.org Document Released: 05/24/2005 Document Revised: 08/16/2011 Document Reviewed: 09/03/2009 [facility] Patient Information 2015 [place], [place]. This information is not intended to replace advice given to you by your health care provider. Make sure you discuss any questions you have with your health care provider.

## 2014-12-31 NOTE — Telephone Encounter (Signed)
Patient called stating the Cymbalta she thought she was taking 30mg  1 x day but she looked at prescription from [facility] she is taking 30mg  2x day. She forgot she was taking the 2nd dose at night. She states she did not need a refill. I relayed to her it had been sent to pharmacy.

## 2015-01-01 MED ORDER — GABAPENTIN 100 MG PO CAPS: ORAL_CAPSULE | ORAL | Status: DC

## 2015-01-01 NOTE — Addendum Note (Signed)
Addended by: Margette Fast on: 01/01/2015 06:09 PM   Modules accepted: Orders

## 2015-01-01 NOTE — Telephone Encounter (Signed)
I called patient. The patient wishes to go ahead and try the gabapentin, I will start in low dose, gradually working up on the total dose. I have called in a prescription.

## 2015-01-01 NOTE — Telephone Encounter (Signed)
Patient called stating she would like to try the additional medication. She states she has never taken either of the medications. Please call and advise.

## 2015-01-02 ENCOUNTER — Telehealth: Payer: Self-pay | Admitting: Neurology

## 2015-01-02 LAB — ENA+DNA/DS+SJORGEN'S
ENA RNP Ab: <0.2 AI (ref 0.0–0.9)
ENA SM Ab Ser-aCnc: <0.2 AI (ref 0.0–0.9)
ENA SSA (RO) Ab: <0.2 AI (ref 0.0–0.9)
ENA SSB (LA) Ab: 1.4 AI — ABNORMAL HIGH (ref 0.0–0.9)
dsDNA Ab: <1 IU/mL (ref 0–9)

## 2015-01-02 LAB — VITAMIN B12: VITAMIN B 12: 827 pg/mL (ref 211–946)

## 2015-01-02 LAB — ANGIOTENSIN CONVERTING ENZYME: Angio Convert Enzyme: 56 U/L (ref 14–82)

## 2015-01-02 LAB — RHEUMATOID FACTOR: Rhuematoid fact SerPl-aCnc: 7.3 IU/mL (ref 0.0–13.9)

## 2015-01-02 LAB — RPR: RPR Ser Ql: NONREACTIVE

## 2015-01-02 LAB — COPPER, SERUM: COPPER: 118 ug/dL (ref 72–166)

## 2015-01-02 LAB — ANA W/REFLEX: ANA: POSITIVE — AB

## 2015-01-02 LAB — TSH: TSH: 1.09 u[IU]/mL (ref 0.450–4.500)

## 2015-01-02 LAB — CK: CK TOTAL: 164 U/L (ref 24–173)

## 2015-01-02 LAB — SEDIMENTATION RATE: SED RATE: 4 mm/h (ref 0–40)

## 2015-01-02 NOTE — Telephone Encounter (Signed)
I called patient. The blood work is unremarkable with exception that the ANA was positive, low titer of the SSB antibody. This is likely is not clinically significant.

## 2015-01-09 ENCOUNTER — Ambulatory Visit
Admission: RE | Admit: 2015-01-09 | Discharge: 2015-01-09 | Disposition: A | Discharge status: Home / Self Care | Payer: Medicare Other | Source: Ambulatory Visit | Attending: Neurology | Admitting: Neurology

## 2015-01-09 DIAGNOSIS — R413 Other amnesia: Secondary | ICD-10-CM

## 2015-01-09 DIAGNOSIS — R5382 Chronic fatigue, unspecified: Secondary | ICD-10-CM

## 2015-01-13 ENCOUNTER — Telehealth: Payer: Self-pay | Admitting: Neurology

## 2015-01-13 NOTE — Telephone Encounter (Signed)
MRI the brain was done secondary to report of a memory disturbance. This shows no change from prior study in April 2015. We may consider medications for memory the future.   MRI brain 01/13/15:  IMPRESSION: This is an abnormal MRI of the brain without contrast with the following: 1. Right frontal lobe encephalomalacia with adjacent gliosis at the site of prior craniotomy with a smaller focus of gliosis in the left frontal lobe. This is unchanged when compared to an MRI dated 09/06/2013. 2. Mild chronic microvascular ischemic changes in the pons and the hemispheres. 3. There are no acute findings.

## 2015-01-15 ENCOUNTER — Telehealth: Payer: Self-pay | Admitting: Neurology

## 2015-01-15 NOTE — Telephone Encounter (Signed)
I called the patient. I explained Dr. Jannifer Franklin' telephone note from 01/13/15 to her. She verbalized understanding and stated that she wondered if Dr. Jannifer Franklin had anything else he could do for her leg pain. She stated that she has been taking the Gabapentin, but it has not helped.

## 2015-01-15 NOTE — Telephone Encounter (Signed)
I called the patient. The patient has an abnormal MRI, she has encephalomalacia in the frontal lobes that is associated with prior surgery, unchanged from April 2015. Nothing that would explain an evolving memory problem. The patient is on gabapentin for what is felt to be fibromyalgia. The patient is tolerating gabapentin, currently on 200 mg 3 times a day. She is to call me in a couple weeks if she thinks she can tolerate higher dose.

## 2015-01-15 NOTE — Telephone Encounter (Signed)
Patient would like to discuss her MRI with Dr. Jannifer Franklin. Best call back number is [phone].

## 2015-01-27 ENCOUNTER — Telehealth: Payer: Self-pay | Admitting: Neurology

## 2015-01-27 MED ORDER — PREGABALIN 25 MG PO CAPS: ORAL_CAPSULE | ORAL | Status: DC

## 2015-01-27 NOTE — Telephone Encounter (Signed)
Patient called regarding gabapentin (NEURONTIN) 100 MG capsule. "She just can't take it. Has horrible, horrible dreams. It was difficult for husband to wake her from the dreams". Home [phone] or Cell [phone].

## 2015-01-27 NOTE — Telephone Encounter (Signed)
I called the patient. She stated the gabapentin helped but once she increased her dose to 2 capsules three times daily, she began having nightmares. She states that she just cannot take it. I advised that I would check with Dr. Jannifer Franklin to see if she could try anything else.

## 2015-01-27 NOTE — Telephone Encounter (Signed)
I called the patient. She cannot tolerate the gabapentin, I will call in Lyrica.

## 2015-02-11 ENCOUNTER — Telehealth: Payer: Self-pay | Admitting: Neurology

## 2015-02-11 MED ORDER — DONEPEZIL HCL 5 MG PO TABS: 5.0000 mg | ORAL_TABLET | Freq: Every day | ORAL | Status: DC

## 2015-02-11 NOTE — Telephone Encounter (Signed)
I called the patient. She cannot afford the Lyrica, she has gone back to gabapentin, she will go to 200 mg 3 times daily. She does believe that this is helping some of her pain. She also wishes to start Aricept for memory, I will call in a medication for this.

## 2015-02-11 NOTE — Telephone Encounter (Signed)
I called the patient. She wanted to let Dr. Jannifer Franklin know that she never filled the Lyrica Rx. She has started the gabapentin again and so far has not had any of the terrible dreams she had with it previously. She would like to start on medication to help with her memory.

## 2015-02-11 NOTE — Telephone Encounter (Signed)
Patient called re: pregabalin (LYRICA) 25 MG capsule. Patient never did pick up the medication at the pharmacy because with insurance it's over $800. She went back to taking original Rx Gabapentin. Also wants to discuss memory medication.

## 2015-02-26 ENCOUNTER — Other Ambulatory Visit: Payer: Self-pay

## 2015-02-26 ENCOUNTER — Other Ambulatory Visit: Payer: Self-pay | Admitting: Neurology

## 2015-02-26 MED ORDER — GABAPENTIN 100 MG PO CAPS: 200.0000 mg | ORAL_CAPSULE | Freq: Three times a day (TID) | ORAL | Status: DC

## 2015-03-12 ENCOUNTER — Telehealth: Payer: Self-pay | Admitting: Neurology

## 2015-03-12 MED ORDER — GABAPENTIN 100 MG PO CAPS: 200.0000 mg | ORAL_CAPSULE | Freq: Three times a day (TID) | ORAL | Status: DC

## 2015-03-12 MED ORDER — DONEPEZIL HCL 5 MG PO TABS: 5.0000 mg | ORAL_TABLET | Freq: Every day | ORAL | Status: DC

## 2015-03-12 NOTE — Telephone Encounter (Signed)
[place] [facility] [phone] called requesting refills on gabapentin (NEURONTIN) 100 MG capsule and donepezil (ARICEPT) 5 MG tablet

## 2015-04-30 ENCOUNTER — Telehealth: Payer: Self-pay

## 2015-04-30 NOTE — Telephone Encounter (Signed)
I called the patient to r/s her appointment 12/1. I left a voicemail. Patient may be r/s with NP.

## 2015-05-05 NOTE — Telephone Encounter (Signed)
Appointment scheduled with Jinny Blossom, NP 12/1.

## 2015-05-07 ENCOUNTER — Other Ambulatory Visit: Payer: Self-pay | Admitting: Neurology

## 2015-05-08 ENCOUNTER — Ambulatory Visit: Payer: Self-pay | Admitting: Neurology

## 2015-05-08 ENCOUNTER — Ambulatory Visit: Payer: Self-pay | Admitting: Adult Health

## 2015-06-19 ENCOUNTER — Ambulatory Visit: Payer: Self-pay | Admitting: Neurology

## 2015-08-06 DIAGNOSIS — K111 Hypertrophy of salivary gland: Secondary | ICD-10-CM | POA: Diagnosis not present

## 2015-08-06 DIAGNOSIS — I10 Essential (primary) hypertension: Secondary | ICD-10-CM | POA: Diagnosis not present

## 2015-08-06 DIAGNOSIS — R413 Other amnesia: Secondary | ICD-10-CM | POA: Diagnosis not present

## 2015-10-02 DIAGNOSIS — M5416 Radiculopathy, lumbar region: Secondary | ICD-10-CM | POA: Diagnosis not present

## 2015-11-19 DIAGNOSIS — M549 Dorsalgia, unspecified: Secondary | ICD-10-CM | POA: Diagnosis not present

## 2015-11-26 DIAGNOSIS — F329 Major depressive disorder, single episode, unspecified: Secondary | ICD-10-CM | POA: Diagnosis not present

## 2015-11-26 DIAGNOSIS — M797 Fibromyalgia: Secondary | ICD-10-CM | POA: Diagnosis not present

## 2015-11-26 DIAGNOSIS — I10 Essential (primary) hypertension: Secondary | ICD-10-CM | POA: Diagnosis not present

## 2015-11-26 DIAGNOSIS — Z6831 Body mass index (BMI) 31.0-31.9, adult: Secondary | ICD-10-CM | POA: Diagnosis not present

## 2015-11-26 DIAGNOSIS — M5136 Other intervertebral disc degeneration, lumbar region: Secondary | ICD-10-CM | POA: Diagnosis not present

## 2015-11-26 DIAGNOSIS — Z0000 Encounter for general adult medical examination without abnormal findings: Secondary | ICD-10-CM | POA: Diagnosis not present

## 2015-12-12 ENCOUNTER — Ambulatory Visit (INDEPENDENT_AMBULATORY_CARE_PROVIDER_SITE_OTHER): Payer: Medicare Other | Admitting: Podiatry

## 2015-12-12 ENCOUNTER — Encounter: Payer: Self-pay | Admitting: Podiatry

## 2015-12-12 VITALS — BP 141/64 | HR 69 | Resp 16 | Ht 63.0 in | Wt 168.0 lb

## 2015-12-12 DIAGNOSIS — L600 Ingrowing nail: Secondary | ICD-10-CM | POA: Diagnosis not present

## 2015-12-12 DIAGNOSIS — B351 Tinea unguium: Secondary | ICD-10-CM | POA: Diagnosis not present

## 2015-12-12 NOTE — Progress Notes (Signed)
   Subjective:    Patient ID: Jacqueline Christian, female    DOB: Jul 15, 1945, 70 y.o.   MRN: [id number]  HPI Chief Complaint  Patient presents with  . Nail Problem    Bilateral; great toes; Right foot-nail discoloration & thickened nails (3 yrs); Left foot-nail is sore (x1 month)      Review of Systems  Constitutional: Positive for fatigue and unexpected weight change.  HENT: Positive for hearing loss and sinus pressure.   Psychiatric/Behavioral: The patient is nervous/anxious.   All other systems reviewed and are negative.      Objective:   Physical Exam        Assessment & Plan:

## 2015-12-12 NOTE — Patient Instructions (Signed)

## 2015-12-14 NOTE — Progress Notes (Signed)
Subjective:     Patient ID: Jacqueline Christian, female   DOB: 01-28-1946, 70 y.o.   MRN: [id number]  HPI patient presents stating she has a painful thickened left hallux nail and right hallux nail moderately deformed with the left hallux lateral border being very tender when pressed   Review of Systems  All other systems reviewed and are negative.      Objective:   Physical Exam  Constitutional: She is oriented to person, place, and time.  Cardiovascular: Intact distal pulses.   Musculoskeletal: Normal range of motion.  Neurological: She is oriented to person, place, and time.  Skin: Skin is warm.  Nursing note and vitals reviewed.  neurovascular status intact muscle strength adequate range of motion within normal limits with patient found to have damaged hallux nails bilateral with thickness and on the lateral border of the left hallux is incurvated and was very tender when pressed. Patient's found to have good digital perfusion and is well oriented 3     Assessment:     Probable trauma to the hallux nail bilateral with mycotic nail infection secondary and incurvation of the lateral side left hallux secondary to changes and nail structure    Plan:     H&P condition reviewed and I've recommended correction of the corner the left hallux nail. Patient wants procedure and I allowed her to understand risk and after discussion of this I infiltrated 60 mg Xylocaine Marcaine mixture removed the lateral border removed incurvated tissue and applied phenol 3 applications 30 seconds followed by alcohol lavage and sterile dressing gave instructions on soaks and reappoint

## 2015-12-17 DIAGNOSIS — H40053 Ocular hypertension, bilateral: Secondary | ICD-10-CM | POA: Diagnosis not present

## 2015-12-29 DIAGNOSIS — R310 Gross hematuria: Secondary | ICD-10-CM | POA: Diagnosis not present

## 2015-12-29 DIAGNOSIS — Z6832 Body mass index (BMI) 32.0-32.9, adult: Secondary | ICD-10-CM | POA: Diagnosis not present

## 2015-12-29 DIAGNOSIS — R300 Dysuria: Secondary | ICD-10-CM | POA: Diagnosis not present

## 2015-12-30 DIAGNOSIS — R310 Gross hematuria: Secondary | ICD-10-CM | POA: Diagnosis not present

## 2016-01-06 ENCOUNTER — Telehealth: Payer: Self-pay | Admitting: *Deleted

## 2016-01-06 NOTE — Telephone Encounter (Signed)
Called patient at [phone] (Home #) to check to see how they were doing from their ingrown toenail procedure that was performed on Friday, December 12, 2015. Pt stated, "Feeling fine, but is still sore. Pt has no pain". I recommended to patient to soak their toe to see if that would help with the soreness. Pt stated they understood.

## 2016-02-03 DIAGNOSIS — Z6833 Body mass index (BMI) 33.0-33.9, adult: Secondary | ICD-10-CM | POA: Diagnosis not present

## 2016-02-03 DIAGNOSIS — R159 Full incontinence of feces: Secondary | ICD-10-CM | POA: Diagnosis not present

## 2016-02-03 DIAGNOSIS — M545 Low back pain: Secondary | ICD-10-CM | POA: Diagnosis not present

## 2016-02-03 DIAGNOSIS — G8929 Other chronic pain: Secondary | ICD-10-CM | POA: Diagnosis not present

## 2016-04-22 DIAGNOSIS — M79674 Pain in right toe(s): Secondary | ICD-10-CM | POA: Diagnosis not present

## 2016-04-22 DIAGNOSIS — S92324A Nondisplaced fracture of second metatarsal bone, right foot, initial encounter for closed fracture: Secondary | ICD-10-CM | POA: Diagnosis not present

## 2016-04-22 DIAGNOSIS — M79671 Pain in right foot: Secondary | ICD-10-CM | POA: Diagnosis not present

## 2016-05-07 DIAGNOSIS — L989 Disorder of the skin and subcutaneous tissue, unspecified: Secondary | ICD-10-CM | POA: Diagnosis not present

## 2016-05-07 DIAGNOSIS — Z23 Encounter for immunization: Secondary | ICD-10-CM | POA: Diagnosis not present

## 2016-05-13 DIAGNOSIS — M779 Enthesopathy, unspecified: Secondary | ICD-10-CM | POA: Diagnosis not present

## 2016-05-13 DIAGNOSIS — M79671 Pain in right foot: Secondary | ICD-10-CM | POA: Diagnosis not present

## 2016-05-17 DIAGNOSIS — B078 Other viral warts: Secondary | ICD-10-CM | POA: Diagnosis not present

## 2016-05-17 DIAGNOSIS — L308 Other specified dermatitis: Secondary | ICD-10-CM | POA: Diagnosis not present

## 2016-05-17 DIAGNOSIS — L820 Inflamed seborrheic keratosis: Secondary | ICD-10-CM | POA: Diagnosis not present

## 2016-06-09 DIAGNOSIS — M79671 Pain in right foot: Secondary | ICD-10-CM | POA: Diagnosis not present

## 2016-06-09 DIAGNOSIS — M67873 Other specified disorders of tendon, right ankle and foot: Secondary | ICD-10-CM | POA: Diagnosis not present

## 2016-07-02 DIAGNOSIS — R310 Gross hematuria: Secondary | ICD-10-CM | POA: Diagnosis not present

## 2016-07-02 DIAGNOSIS — R350 Frequency of micturition: Secondary | ICD-10-CM | POA: Diagnosis not present

## 2016-08-25 ENCOUNTER — Ambulatory Visit (INDEPENDENT_AMBULATORY_CARE_PROVIDER_SITE_OTHER): Payer: Medicare Other | Admitting: Orthopaedic Surgery

## 2016-10-13 DIAGNOSIS — R309 Painful micturition, unspecified: Secondary | ICD-10-CM | POA: Diagnosis not present

## 2016-10-13 DIAGNOSIS — R350 Frequency of micturition: Secondary | ICD-10-CM | POA: Diagnosis not present

## 2016-10-14 DIAGNOSIS — R309 Painful micturition, unspecified: Secondary | ICD-10-CM | POA: Diagnosis not present

## 2016-10-19 DIAGNOSIS — H40053 Ocular hypertension, bilateral: Secondary | ICD-10-CM | POA: Diagnosis not present

## 2016-10-26 DIAGNOSIS — D329 Benign neoplasm of meninges, unspecified: Secondary | ICD-10-CM | POA: Diagnosis not present

## 2016-10-26 DIAGNOSIS — R51 Headache: Secondary | ICD-10-CM | POA: Diagnosis not present

## 2016-11-05 DIAGNOSIS — R2689 Other abnormalities of gait and mobility: Secondary | ICD-10-CM | POA: Diagnosis not present

## 2016-11-05 DIAGNOSIS — D329 Benign neoplasm of meninges, unspecified: Secondary | ICD-10-CM | POA: Diagnosis not present

## 2016-11-05 DIAGNOSIS — Z6830 Body mass index (BMI) 30.0-30.9, adult: Secondary | ICD-10-CM | POA: Diagnosis not present

## 2016-11-05 DIAGNOSIS — I10 Essential (primary) hypertension: Secondary | ICD-10-CM | POA: Diagnosis not present

## 2016-11-08 ENCOUNTER — Encounter: Payer: Self-pay | Admitting: Neurology

## 2016-12-10 ENCOUNTER — Ambulatory Visit: Payer: Medicare Other | Admitting: Neurology

## 2016-12-20 DIAGNOSIS — M5136 Other intervertebral disc degeneration, lumbar region: Secondary | ICD-10-CM | POA: Diagnosis not present

## 2016-12-20 DIAGNOSIS — F4321 Adjustment disorder with depressed mood: Secondary | ICD-10-CM | POA: Diagnosis not present

## 2016-12-20 DIAGNOSIS — G8929 Other chronic pain: Secondary | ICD-10-CM | POA: Diagnosis not present

## 2016-12-21 DIAGNOSIS — H2513 Age-related nuclear cataract, bilateral: Secondary | ICD-10-CM | POA: Diagnosis not present

## 2016-12-21 DIAGNOSIS — H532 Diplopia: Secondary | ICD-10-CM | POA: Diagnosis not present

## 2017-01-14 DIAGNOSIS — H25813 Combined forms of age-related cataract, bilateral: Secondary | ICD-10-CM | POA: Diagnosis not present

## 2017-01-14 DIAGNOSIS — H4720 Unspecified optic atrophy: Secondary | ICD-10-CM | POA: Diagnosis not present

## 2017-01-14 DIAGNOSIS — H5203 Hypermetropia, bilateral: Secondary | ICD-10-CM | POA: Diagnosis not present

## 2017-01-14 DIAGNOSIS — H532 Diplopia: Secondary | ICD-10-CM | POA: Diagnosis not present

## 2017-03-02 DIAGNOSIS — F4320 Adjustment disorder, unspecified: Secondary | ICD-10-CM | POA: Diagnosis not present

## 2017-03-02 DIAGNOSIS — F341 Dysthymic disorder: Secondary | ICD-10-CM | POA: Diagnosis not present

## 2017-03-02 DIAGNOSIS — M5136 Other intervertebral disc degeneration, lumbar region: Secondary | ICD-10-CM | POA: Diagnosis not present

## 2017-03-02 DIAGNOSIS — Z23 Encounter for immunization: Secondary | ICD-10-CM | POA: Diagnosis not present

## 2017-03-02 DIAGNOSIS — M797 Fibromyalgia: Secondary | ICD-10-CM | POA: Diagnosis not present

## 2017-03-02 DIAGNOSIS — I10 Essential (primary) hypertension: Secondary | ICD-10-CM | POA: Diagnosis not present

## 2017-04-08 DIAGNOSIS — R609 Edema, unspecified: Secondary | ICD-10-CM | POA: Diagnosis not present

## 2017-04-08 DIAGNOSIS — R32 Unspecified urinary incontinence: Secondary | ICD-10-CM | POA: Diagnosis not present

## 2017-04-08 DIAGNOSIS — I10 Essential (primary) hypertension: Secondary | ICD-10-CM | POA: Diagnosis not present

## 2017-04-08 DIAGNOSIS — Z6828 Body mass index (BMI) 28.0-28.9, adult: Secondary | ICD-10-CM | POA: Diagnosis not present

## 2017-04-08 DIAGNOSIS — F329 Major depressive disorder, single episode, unspecified: Secondary | ICD-10-CM | POA: Diagnosis not present

## 2017-04-08 DIAGNOSIS — J309 Allergic rhinitis, unspecified: Secondary | ICD-10-CM | POA: Diagnosis not present

## 2017-04-08 DIAGNOSIS — R21 Rash and other nonspecific skin eruption: Secondary | ICD-10-CM | POA: Diagnosis not present

## 2017-04-08 DIAGNOSIS — M47816 Spondylosis without myelopathy or radiculopathy, lumbar region: Secondary | ICD-10-CM | POA: Diagnosis not present

## 2017-04-08 DIAGNOSIS — F4321 Adjustment disorder with depressed mood: Secondary | ICD-10-CM | POA: Diagnosis not present

## 2017-04-08 DIAGNOSIS — M797 Fibromyalgia: Secondary | ICD-10-CM | POA: Diagnosis not present

## 2017-04-08 DIAGNOSIS — Z299 Encounter for prophylactic measures, unspecified: Secondary | ICD-10-CM | POA: Diagnosis not present

## 2017-05-06 DIAGNOSIS — R32 Unspecified urinary incontinence: Secondary | ICD-10-CM | POA: Diagnosis not present

## 2017-05-06 DIAGNOSIS — F329 Major depressive disorder, single episode, unspecified: Secondary | ICD-10-CM | POA: Diagnosis not present

## 2017-05-06 DIAGNOSIS — I10 Essential (primary) hypertension: Secondary | ICD-10-CM | POA: Diagnosis not present

## 2017-05-06 DIAGNOSIS — M797 Fibromyalgia: Secondary | ICD-10-CM | POA: Diagnosis not present

## 2017-05-06 DIAGNOSIS — Z299 Encounter for prophylactic measures, unspecified: Secondary | ICD-10-CM | POA: Diagnosis not present

## 2017-05-06 DIAGNOSIS — Z789 Other specified health status: Secondary | ICD-10-CM | POA: Diagnosis not present

## 2017-06-03 DIAGNOSIS — I10 Essential (primary) hypertension: Secondary | ICD-10-CM | POA: Diagnosis not present

## 2017-06-03 DIAGNOSIS — Z6827 Body mass index (BMI) 27.0-27.9, adult: Secondary | ICD-10-CM | POA: Diagnosis not present

## 2017-06-03 DIAGNOSIS — M797 Fibromyalgia: Secondary | ICD-10-CM | POA: Diagnosis not present

## 2017-06-03 DIAGNOSIS — Z299 Encounter for prophylactic measures, unspecified: Secondary | ICD-10-CM | POA: Diagnosis not present

## 2017-06-03 DIAGNOSIS — R32 Unspecified urinary incontinence: Secondary | ICD-10-CM | POA: Diagnosis not present

## 2017-07-01 DIAGNOSIS — I10 Essential (primary) hypertension: Secondary | ICD-10-CM | POA: Diagnosis not present

## 2017-07-01 DIAGNOSIS — F329 Major depressive disorder, single episode, unspecified: Secondary | ICD-10-CM | POA: Diagnosis not present

## 2017-07-01 DIAGNOSIS — M797 Fibromyalgia: Secondary | ICD-10-CM | POA: Diagnosis not present

## 2017-07-01 DIAGNOSIS — Z6827 Body mass index (BMI) 27.0-27.9, adult: Secondary | ICD-10-CM | POA: Diagnosis not present

## 2017-07-01 DIAGNOSIS — Z299 Encounter for prophylactic measures, unspecified: Secondary | ICD-10-CM | POA: Diagnosis not present

## 2017-07-29 DIAGNOSIS — Z6828 Body mass index (BMI) 28.0-28.9, adult: Secondary | ICD-10-CM | POA: Diagnosis not present

## 2017-07-29 DIAGNOSIS — Z299 Encounter for prophylactic measures, unspecified: Secondary | ICD-10-CM | POA: Diagnosis not present

## 2017-07-29 DIAGNOSIS — I10 Essential (primary) hypertension: Secondary | ICD-10-CM | POA: Diagnosis not present

## 2017-07-29 DIAGNOSIS — F4321 Adjustment disorder with depressed mood: Secondary | ICD-10-CM | POA: Diagnosis not present

## 2017-07-29 DIAGNOSIS — M797 Fibromyalgia: Secondary | ICD-10-CM | POA: Diagnosis not present

## 2017-08-19 DIAGNOSIS — H04123 Dry eye syndrome of bilateral lacrimal glands: Secondary | ICD-10-CM | POA: Diagnosis not present

## 2017-08-29 DIAGNOSIS — H04123 Dry eye syndrome of bilateral lacrimal glands: Secondary | ICD-10-CM | POA: Diagnosis not present

## 2017-08-30 DIAGNOSIS — F4321 Adjustment disorder with depressed mood: Secondary | ICD-10-CM | POA: Diagnosis not present

## 2017-08-30 DIAGNOSIS — Z6828 Body mass index (BMI) 28.0-28.9, adult: Secondary | ICD-10-CM | POA: Diagnosis not present

## 2017-08-30 DIAGNOSIS — Z299 Encounter for prophylactic measures, unspecified: Secondary | ICD-10-CM | POA: Diagnosis not present

## 2017-08-30 DIAGNOSIS — I10 Essential (primary) hypertension: Secondary | ICD-10-CM | POA: Diagnosis not present

## 2017-08-30 DIAGNOSIS — R682 Dry mouth, unspecified: Secondary | ICD-10-CM | POA: Diagnosis not present

## 2017-08-30 DIAGNOSIS — M797 Fibromyalgia: Secondary | ICD-10-CM | POA: Diagnosis not present

## 2017-09-12 DIAGNOSIS — R300 Dysuria: Secondary | ICD-10-CM | POA: Diagnosis not present

## 2017-09-12 DIAGNOSIS — N390 Urinary tract infection, site not specified: Secondary | ICD-10-CM | POA: Diagnosis not present

## 2017-09-12 DIAGNOSIS — Z299 Encounter for prophylactic measures, unspecified: Secondary | ICD-10-CM | POA: Diagnosis not present

## 2017-09-12 DIAGNOSIS — Z6828 Body mass index (BMI) 28.0-28.9, adult: Secondary | ICD-10-CM | POA: Diagnosis not present

## 2017-09-12 DIAGNOSIS — I10 Essential (primary) hypertension: Secondary | ICD-10-CM | POA: Diagnosis not present

## 2017-09-30 DIAGNOSIS — M797 Fibromyalgia: Secondary | ICD-10-CM | POA: Diagnosis not present

## 2017-09-30 DIAGNOSIS — Z299 Encounter for prophylactic measures, unspecified: Secondary | ICD-10-CM | POA: Diagnosis not present

## 2017-09-30 DIAGNOSIS — J309 Allergic rhinitis, unspecified: Secondary | ICD-10-CM | POA: Diagnosis not present

## 2017-09-30 DIAGNOSIS — Z6828 Body mass index (BMI) 28.0-28.9, adult: Secondary | ICD-10-CM | POA: Diagnosis not present

## 2017-09-30 DIAGNOSIS — I10 Essential (primary) hypertension: Secondary | ICD-10-CM | POA: Diagnosis not present

## 2017-10-17 DIAGNOSIS — Z6828 Body mass index (BMI) 28.0-28.9, adult: Secondary | ICD-10-CM | POA: Diagnosis not present

## 2017-10-17 DIAGNOSIS — Z299 Encounter for prophylactic measures, unspecified: Secondary | ICD-10-CM | POA: Diagnosis not present

## 2017-10-17 DIAGNOSIS — R32 Unspecified urinary incontinence: Secondary | ICD-10-CM | POA: Diagnosis not present

## 2017-10-17 DIAGNOSIS — R350 Frequency of micturition: Secondary | ICD-10-CM | POA: Diagnosis not present

## 2017-10-17 DIAGNOSIS — N8110 Cystocele, unspecified: Secondary | ICD-10-CM | POA: Diagnosis not present

## 2017-10-20 DIAGNOSIS — H43813 Vitreous degeneration, bilateral: Secondary | ICD-10-CM | POA: Diagnosis not present

## 2017-10-26 DIAGNOSIS — Z6828 Body mass index (BMI) 28.0-28.9, adult: Secondary | ICD-10-CM | POA: Diagnosis not present

## 2017-10-26 DIAGNOSIS — I10 Essential (primary) hypertension: Secondary | ICD-10-CM | POA: Diagnosis not present

## 2017-10-26 DIAGNOSIS — Z713 Dietary counseling and surveillance: Secondary | ICD-10-CM | POA: Diagnosis not present

## 2017-10-26 DIAGNOSIS — Z299 Encounter for prophylactic measures, unspecified: Secondary | ICD-10-CM | POA: Diagnosis not present

## 2017-10-26 DIAGNOSIS — M1990 Unspecified osteoarthritis, unspecified site: Secondary | ICD-10-CM | POA: Diagnosis not present

## 2017-11-17 DIAGNOSIS — N3941 Urge incontinence: Secondary | ICD-10-CM | POA: Diagnosis not present

## 2017-11-17 DIAGNOSIS — R350 Frequency of micturition: Secondary | ICD-10-CM | POA: Diagnosis not present

## 2017-11-17 DIAGNOSIS — R351 Nocturia: Secondary | ICD-10-CM | POA: Diagnosis not present

## 2017-11-30 DIAGNOSIS — Z6828 Body mass index (BMI) 28.0-28.9, adult: Secondary | ICD-10-CM | POA: Diagnosis not present

## 2017-11-30 DIAGNOSIS — Z299 Encounter for prophylactic measures, unspecified: Secondary | ICD-10-CM | POA: Diagnosis not present

## 2017-11-30 DIAGNOSIS — F329 Major depressive disorder, single episode, unspecified: Secondary | ICD-10-CM | POA: Diagnosis not present

## 2017-11-30 DIAGNOSIS — J069 Acute upper respiratory infection, unspecified: Secondary | ICD-10-CM | POA: Diagnosis not present

## 2017-11-30 DIAGNOSIS — I10 Essential (primary) hypertension: Secondary | ICD-10-CM | POA: Diagnosis not present

## 2017-11-30 DIAGNOSIS — M47816 Spondylosis without myelopathy or radiculopathy, lumbar region: Secondary | ICD-10-CM | POA: Diagnosis not present

## 2017-12-28 DIAGNOSIS — Z299 Encounter for prophylactic measures, unspecified: Secondary | ICD-10-CM | POA: Diagnosis not present

## 2017-12-28 DIAGNOSIS — Z6828 Body mass index (BMI) 28.0-28.9, adult: Secondary | ICD-10-CM | POA: Diagnosis not present

## 2017-12-28 DIAGNOSIS — Z789 Other specified health status: Secondary | ICD-10-CM | POA: Diagnosis not present

## 2017-12-28 DIAGNOSIS — M47816 Spondylosis without myelopathy or radiculopathy, lumbar region: Secondary | ICD-10-CM | POA: Diagnosis not present

## 2017-12-28 DIAGNOSIS — I10 Essential (primary) hypertension: Secondary | ICD-10-CM | POA: Diagnosis not present

## 2017-12-28 DIAGNOSIS — F329 Major depressive disorder, single episode, unspecified: Secondary | ICD-10-CM | POA: Diagnosis not present

## 2017-12-30 DIAGNOSIS — N3941 Urge incontinence: Secondary | ICD-10-CM | POA: Diagnosis not present

## 2017-12-30 DIAGNOSIS — R351 Nocturia: Secondary | ICD-10-CM | POA: Diagnosis not present

## 2018-02-03 DIAGNOSIS — Z299 Encounter for prophylactic measures, unspecified: Secondary | ICD-10-CM | POA: Diagnosis not present

## 2018-02-03 DIAGNOSIS — Z6828 Body mass index (BMI) 28.0-28.9, adult: Secondary | ICD-10-CM | POA: Diagnosis not present

## 2018-02-03 DIAGNOSIS — I10 Essential (primary) hypertension: Secondary | ICD-10-CM | POA: Diagnosis not present

## 2018-02-03 DIAGNOSIS — M47816 Spondylosis without myelopathy or radiculopathy, lumbar region: Secondary | ICD-10-CM | POA: Diagnosis not present

## 2018-02-03 DIAGNOSIS — Z79899 Other long term (current) drug therapy: Secondary | ICD-10-CM | POA: Diagnosis not present

## 2018-02-03 DIAGNOSIS — R5383 Other fatigue: Secondary | ICD-10-CM | POA: Diagnosis not present

## 2018-03-02 DIAGNOSIS — Z299 Encounter for prophylactic measures, unspecified: Secondary | ICD-10-CM | POA: Diagnosis not present

## 2018-03-02 DIAGNOSIS — Z1211 Encounter for screening for malignant neoplasm of colon: Secondary | ICD-10-CM | POA: Diagnosis not present

## 2018-03-02 DIAGNOSIS — M1990 Unspecified osteoarthritis, unspecified site: Secondary | ICD-10-CM | POA: Diagnosis not present

## 2018-03-02 DIAGNOSIS — Z0000 Encounter for general adult medical examination without abnormal findings: Secondary | ICD-10-CM | POA: Diagnosis not present

## 2018-03-02 DIAGNOSIS — F329 Major depressive disorder, single episode, unspecified: Secondary | ICD-10-CM | POA: Diagnosis not present

## 2018-03-02 DIAGNOSIS — R5383 Other fatigue: Secondary | ICD-10-CM | POA: Diagnosis not present

## 2018-03-02 DIAGNOSIS — Z1331 Encounter for screening for depression: Secondary | ICD-10-CM | POA: Diagnosis not present

## 2018-03-02 DIAGNOSIS — I10 Essential (primary) hypertension: Secondary | ICD-10-CM | POA: Diagnosis not present

## 2018-03-02 DIAGNOSIS — Z23 Encounter for immunization: Secondary | ICD-10-CM | POA: Diagnosis not present

## 2018-03-02 DIAGNOSIS — Z6830 Body mass index (BMI) 30.0-30.9, adult: Secondary | ICD-10-CM | POA: Diagnosis not present

## 2018-03-02 DIAGNOSIS — Z79899 Other long term (current) drug therapy: Secondary | ICD-10-CM | POA: Diagnosis not present

## 2018-03-02 DIAGNOSIS — Z7189 Other specified counseling: Secondary | ICD-10-CM | POA: Diagnosis not present

## 2018-03-02 DIAGNOSIS — M797 Fibromyalgia: Secondary | ICD-10-CM | POA: Diagnosis not present

## 2018-03-02 DIAGNOSIS — R110 Nausea: Secondary | ICD-10-CM | POA: Diagnosis not present

## 2018-03-02 DIAGNOSIS — Z1339 Encounter for screening examination for other mental health and behavioral disorders: Secondary | ICD-10-CM | POA: Diagnosis not present

## 2018-03-27 DIAGNOSIS — Z1231 Encounter for screening mammogram for malignant neoplasm of breast: Secondary | ICD-10-CM | POA: Diagnosis not present

## 2018-03-27 DIAGNOSIS — R921 Mammographic calcification found on diagnostic imaging of breast: Secondary | ICD-10-CM | POA: Diagnosis not present

## 2018-03-30 DIAGNOSIS — Z713 Dietary counseling and surveillance: Secondary | ICD-10-CM | POA: Diagnosis not present

## 2018-03-30 DIAGNOSIS — I10 Essential (primary) hypertension: Secondary | ICD-10-CM | POA: Diagnosis not present

## 2018-03-30 DIAGNOSIS — Z6828 Body mass index (BMI) 28.0-28.9, adult: Secondary | ICD-10-CM | POA: Diagnosis not present

## 2018-03-30 DIAGNOSIS — M797 Fibromyalgia: Secondary | ICD-10-CM | POA: Diagnosis not present

## 2018-03-30 DIAGNOSIS — Z299 Encounter for prophylactic measures, unspecified: Secondary | ICD-10-CM | POA: Diagnosis not present

## 2018-04-03 DIAGNOSIS — Z299 Encounter for prophylactic measures, unspecified: Secondary | ICD-10-CM | POA: Diagnosis not present

## 2018-04-03 DIAGNOSIS — M62838 Other muscle spasm: Secondary | ICD-10-CM | POA: Diagnosis not present

## 2018-04-03 DIAGNOSIS — M797 Fibromyalgia: Secondary | ICD-10-CM | POA: Diagnosis not present

## 2018-04-03 DIAGNOSIS — R5383 Other fatigue: Secondary | ICD-10-CM | POA: Diagnosis not present

## 2018-04-03 DIAGNOSIS — E559 Vitamin D deficiency, unspecified: Secondary | ICD-10-CM | POA: Diagnosis not present

## 2018-04-03 DIAGNOSIS — M1990 Unspecified osteoarthritis, unspecified site: Secondary | ICD-10-CM | POA: Diagnosis not present

## 2018-04-03 DIAGNOSIS — Z6828 Body mass index (BMI) 28.0-28.9, adult: Secondary | ICD-10-CM | POA: Diagnosis not present

## 2018-04-03 DIAGNOSIS — I10 Essential (primary) hypertension: Secondary | ICD-10-CM | POA: Diagnosis not present

## 2018-04-12 ENCOUNTER — Other Ambulatory Visit: Payer: Self-pay | Admitting: Internal Medicine

## 2018-04-12 DIAGNOSIS — R921 Mammographic calcification found on diagnostic imaging of breast: Secondary | ICD-10-CM

## 2018-04-13 DIAGNOSIS — Z299 Encounter for prophylactic measures, unspecified: Secondary | ICD-10-CM | POA: Diagnosis not present

## 2018-04-13 DIAGNOSIS — I10 Essential (primary) hypertension: Secondary | ICD-10-CM | POA: Diagnosis not present

## 2018-04-13 DIAGNOSIS — M62830 Muscle spasm of back: Secondary | ICD-10-CM | POA: Diagnosis not present

## 2018-04-13 DIAGNOSIS — R899 Unspecified abnormal finding in specimens from other organs, systems and tissues: Secondary | ICD-10-CM | POA: Diagnosis not present

## 2018-04-13 DIAGNOSIS — E559 Vitamin D deficiency, unspecified: Secondary | ICD-10-CM | POA: Diagnosis not present

## 2018-04-13 DIAGNOSIS — Z6828 Body mass index (BMI) 28.0-28.9, adult: Secondary | ICD-10-CM | POA: Diagnosis not present

## 2018-04-13 DIAGNOSIS — M797 Fibromyalgia: Secondary | ICD-10-CM | POA: Diagnosis not present

## 2018-04-18 DIAGNOSIS — B029 Zoster without complications: Secondary | ICD-10-CM | POA: Diagnosis not present

## 2018-04-18 DIAGNOSIS — Z299 Encounter for prophylactic measures, unspecified: Secondary | ICD-10-CM | POA: Diagnosis not present

## 2018-04-18 DIAGNOSIS — Z6828 Body mass index (BMI) 28.0-28.9, adult: Secondary | ICD-10-CM | POA: Diagnosis not present

## 2018-04-18 DIAGNOSIS — I10 Essential (primary) hypertension: Secondary | ICD-10-CM | POA: Diagnosis not present

## 2018-04-20 ENCOUNTER — Ambulatory Visit
Admission: RE | Admit: 2018-04-20 | Discharge: 2018-04-20 | Disposition: A | Discharge status: Home / Self Care | Payer: Medicare Other | Source: Ambulatory Visit | Attending: Internal Medicine | Admitting: Internal Medicine

## 2018-04-20 DIAGNOSIS — R921 Mammographic calcification found on diagnostic imaging of breast: Secondary | ICD-10-CM

## 2018-04-20 DIAGNOSIS — C50212 Malignant neoplasm of upper-inner quadrant of left female breast: Secondary | ICD-10-CM | POA: Diagnosis not present

## 2018-04-20 DIAGNOSIS — R920 Mammographic microcalcification found on diagnostic imaging of breast: Secondary | ICD-10-CM | POA: Diagnosis not present

## 2018-04-25 DIAGNOSIS — M797 Fibromyalgia: Secondary | ICD-10-CM | POA: Diagnosis not present

## 2018-04-25 DIAGNOSIS — Z6828 Body mass index (BMI) 28.0-28.9, adult: Secondary | ICD-10-CM | POA: Diagnosis not present

## 2018-04-25 DIAGNOSIS — Z299 Encounter for prophylactic measures, unspecified: Secondary | ICD-10-CM | POA: Diagnosis not present

## 2018-04-25 DIAGNOSIS — I10 Essential (primary) hypertension: Secondary | ICD-10-CM | POA: Diagnosis not present

## 2018-04-25 DIAGNOSIS — C50912 Malignant neoplasm of unspecified site of left female breast: Secondary | ICD-10-CM | POA: Diagnosis not present

## 2018-05-03 ENCOUNTER — Encounter: Payer: Self-pay | Admitting: General Surgery

## 2018-05-03 DIAGNOSIS — I10 Essential (primary) hypertension: Secondary | ICD-10-CM | POA: Diagnosis not present

## 2018-05-03 DIAGNOSIS — Z86011 Personal history of benign neoplasm of the brain: Secondary | ICD-10-CM | POA: Diagnosis not present

## 2018-05-03 DIAGNOSIS — Z9079 Acquired absence of other genital organ(s): Secondary | ICD-10-CM | POA: Diagnosis not present

## 2018-05-03 DIAGNOSIS — C50212 Malignant neoplasm of upper-inner quadrant of left female breast: Secondary | ICD-10-CM | POA: Diagnosis not present

## 2018-05-03 DIAGNOSIS — Z9889 Other specified postprocedural states: Secondary | ICD-10-CM | POA: Diagnosis not present

## 2018-05-03 DIAGNOSIS — Z96653 Presence of artificial knee joint, bilateral: Secondary | ICD-10-CM | POA: Diagnosis not present

## 2018-05-03 DIAGNOSIS — M797 Fibromyalgia: Secondary | ICD-10-CM | POA: Diagnosis not present

## 2018-05-03 DIAGNOSIS — Z90710 Acquired absence of both cervix and uterus: Secondary | ICD-10-CM | POA: Diagnosis not present

## 2018-05-03 DIAGNOSIS — Z90722 Acquired absence of ovaries, bilateral: Secondary | ICD-10-CM | POA: Diagnosis not present

## 2018-05-09 DIAGNOSIS — C50212 Malignant neoplasm of upper-inner quadrant of left female breast: Secondary | ICD-10-CM | POA: Diagnosis not present

## 2018-05-09 DIAGNOSIS — Z171 Estrogen receptor negative status [ER-]: Secondary | ICD-10-CM | POA: Diagnosis not present

## 2018-05-18 DIAGNOSIS — Z171 Estrogen receptor negative status [ER-]: Secondary | ICD-10-CM | POA: Diagnosis not present

## 2018-05-18 DIAGNOSIS — C50212 Malignant neoplasm of upper-inner quadrant of left female breast: Secondary | ICD-10-CM | POA: Diagnosis not present

## 2018-05-18 DIAGNOSIS — C50912 Malignant neoplasm of unspecified site of left female breast: Secondary | ICD-10-CM | POA: Diagnosis not present

## 2018-05-18 DIAGNOSIS — R911 Solitary pulmonary nodule: Secondary | ICD-10-CM | POA: Diagnosis not present

## 2018-05-22 ENCOUNTER — Other Ambulatory Visit: Payer: Self-pay | Admitting: General Surgery

## 2018-05-22 DIAGNOSIS — M797 Fibromyalgia: Secondary | ICD-10-CM | POA: Diagnosis not present

## 2018-05-22 DIAGNOSIS — Z90722 Acquired absence of ovaries, bilateral: Secondary | ICD-10-CM | POA: Diagnosis not present

## 2018-05-22 DIAGNOSIS — Z86011 Personal history of benign neoplasm of the brain: Secondary | ICD-10-CM | POA: Diagnosis not present

## 2018-05-22 DIAGNOSIS — Z9889 Other specified postprocedural states: Secondary | ICD-10-CM | POA: Diagnosis not present

## 2018-05-22 DIAGNOSIS — C50212 Malignant neoplasm of upper-inner quadrant of left female breast: Secondary | ICD-10-CM | POA: Diagnosis not present

## 2018-05-22 DIAGNOSIS — Z90710 Acquired absence of both cervix and uterus: Secondary | ICD-10-CM | POA: Diagnosis not present

## 2018-05-22 DIAGNOSIS — I10 Essential (primary) hypertension: Secondary | ICD-10-CM | POA: Diagnosis not present

## 2018-05-22 DIAGNOSIS — Z9079 Acquired absence of other genital organ(s): Secondary | ICD-10-CM | POA: Diagnosis not present

## 2018-05-22 DIAGNOSIS — C50412 Malignant neoplasm of upper-outer quadrant of left female breast: Secondary | ICD-10-CM

## 2018-05-22 DIAGNOSIS — Z96653 Presence of artificial knee joint, bilateral: Secondary | ICD-10-CM | POA: Diagnosis not present

## 2018-05-23 ENCOUNTER — Other Ambulatory Visit: Payer: Self-pay | Admitting: General Surgery

## 2018-05-23 DIAGNOSIS — Z171 Estrogen receptor negative status [ER-]: Principal | ICD-10-CM

## 2018-05-23 DIAGNOSIS — C50412 Malignant neoplasm of upper-outer quadrant of left female breast: Secondary | ICD-10-CM

## 2018-05-26 DIAGNOSIS — R413 Other amnesia: Secondary | ICD-10-CM | POA: Diagnosis not present

## 2018-05-26 DIAGNOSIS — Z171 Estrogen receptor negative status [ER-]: Secondary | ICD-10-CM | POA: Diagnosis not present

## 2018-05-26 DIAGNOSIS — C50212 Malignant neoplasm of upper-inner quadrant of left female breast: Secondary | ICD-10-CM | POA: Diagnosis not present

## 2018-05-29 ENCOUNTER — Encounter (HOSPITAL_BASED_OUTPATIENT_CLINIC_OR_DEPARTMENT_OTHER): Payer: Self-pay | Admitting: *Deleted

## 2018-05-29 ENCOUNTER — Other Ambulatory Visit: Payer: Self-pay

## 2018-06-08 ENCOUNTER — Encounter (HOSPITAL_BASED_OUTPATIENT_CLINIC_OR_DEPARTMENT_OTHER)
Admission: RE | Admit: 2018-06-08 | Discharge: 2018-06-08 | Disposition: A | Discharge status: Home / Self Care | Payer: Medicare Other | Source: Ambulatory Visit | Attending: General Surgery | Admitting: General Surgery

## 2018-06-08 DIAGNOSIS — I10 Essential (primary) hypertension: Secondary | ICD-10-CM | POA: Insufficient documentation

## 2018-06-08 LAB — CBC WITH DIFFERENTIAL/PLATELET
Abs Immature Granulocytes: 0.03 10*3/uL (ref 0.00–0.07)
Basophils Absolute: 0 10*3/uL (ref 0.0–0.1)
Basophils Relative: 0 %
EOS PCT: 3 %
Eosinophils Absolute: 0.2 10*3/uL (ref 0.0–0.5)
HCT: 41.4 % (ref 36.0–46.0)
Hemoglobin: 14 g/dL (ref 12.0–15.0)
Immature Granulocytes: 0 %
Lymphocytes Relative: 26 %
Lymphs Abs: 2.4 10*3/uL (ref 0.7–4.0)
MCH: 29.9 pg (ref 26.0–34.0)
MCHC: 33.8 g/dL (ref 30.0–36.0)
MCV: 88.3 fL (ref 80.0–100.0)
MONO ABS: 0.6 10*3/uL (ref 0.1–1.0)
Monocytes Relative: 6 %
Neutro Abs: 6 10*3/uL (ref 1.7–7.7)
Neutrophils Relative %: 65 %
Platelets: 236 10*3/uL (ref 150–400)
RBC: 4.69 MIL/uL (ref 3.87–5.11)
RDW: 13.2 % (ref 11.5–15.5)
WBC: 9.4 10*3/uL (ref 4.0–10.5)
nRBC: 0 % (ref 0.0–0.2)

## 2018-06-08 LAB — COMPREHENSIVE METABOLIC PANEL
ALT: 18 U/L (ref 0–44)
ANION GAP: 11 (ref 5–15)
AST: 24 U/L (ref 15–41)
Albumin: 4.4 g/dL (ref 3.5–5.0)
Alkaline Phosphatase: 72 U/L (ref 38–126)
BUN: 15 mg/dL (ref 8–23)
CO2: 28 mmol/L (ref 22–32)
Calcium: 9.8 mg/dL (ref 8.9–10.3)
Chloride: 101 mmol/L (ref 98–111)
Creatinine, Ser: 0.91 mg/dL (ref 0.44–1.00)
GFR calc non Af Amer: >60 mL/min (ref >60)
Glucose, Bld: 95 mg/dL (ref 70–99)
Potassium: 3.9 mmol/L (ref 3.5–5.1)
Sodium: 140 mmol/L (ref 135–145)
Total Bilirubin: 0.7 mg/dL (ref 0.3–1.2)
Total Protein: 7.3 g/dL (ref 6.5–8.1)

## 2018-06-09 ENCOUNTER — Ambulatory Visit
Admission: RE | Admit: 2018-06-09 | Discharge: 2018-06-09 | Disposition: A | Discharge status: Home / Self Care | Payer: Medicare Other | Source: Ambulatory Visit | Attending: General Surgery | Admitting: General Surgery

## 2018-06-09 DIAGNOSIS — Z171 Estrogen receptor negative status [ER-]: Principal | ICD-10-CM

## 2018-06-09 DIAGNOSIS — C50412 Malignant neoplasm of upper-outer quadrant of left female breast: Secondary | ICD-10-CM

## 2018-06-09 DIAGNOSIS — R928 Other abnormal and inconclusive findings on diagnostic imaging of breast: Secondary | ICD-10-CM | POA: Diagnosis not present

## 2018-06-11 NOTE — H&P (Signed)
Jacqueline Christian Location: [facility] Patient #: [id number] DOB: 02/14/1946 Married / Language: English / Race: White Female        History of Present Illness     . This is a very pleasant 73 year old female from [place]. She is here once again with her husband, this time to discuss definitive surgery for her left breast cancer. Image guided biopsies were done at [facility]. Her oncologist is Dr. Candie Echevaria at [facility] in [place].   her PCP is Dr.Vyas.      No prior breast problems. Imaging studies show a small area of calcifications thought to be 11 mm in diameter. Left breast upper outer quadrant. Image guided biopsy performed at [facility] shows a small focus of invasive ductal carcinoma and DCIS. ER and PR negative. HER-2 positive. It is not clear how big the invasive component is. Discussion in breast conference in [place] was to wait on the final pathology before deciding whether she should get Herceptin chemotherapy. If the invasive component of her tumor is 5 cm or less then our oncologist probably would not offer chemotherapy. . She has now been seen by medical oncology in [place]. Patient states they told her she might get chemotherapy. We are going to call them tomorrow to see whether they want a Port-A-Cath and now or wait until the final pathology.      Comorbidities include history of craniotomy for meningioma 2013 by Dr. Vertell Limber. Slight memory disorder. Bilateral total knee replacement. Tracheostomy due to complications following craniotomy down doing well. History TAH and BSO. Fibromyalgia. Hypertension. Family history negative for ovarian cancer and pancreatic cancer or prostate cancer. An aunt had breast cancer. Sister had lung cancer. Mother deceased from colon cancer. Father had a stroke Social history reveals she is married with 2 children. Lives in [place]. Denies alcohol or tobacco. Retired.      She is strongly motivated for breast conservation  and I think she is a good candidate for that She'll be scheduled for injection blue dye left breast, left breast lumpectomy with RSL, left axillary sentinel lymph node biopsy. I discussed the indications, details, techniques, and risk of the surgery in detail with her and her husband. She is aware of the risk of bleeding, infection, reoperation for positive margins or multiple positive nodes, cosmetic deformity, nerve damage with chronic pain, arm swelling, arm numbness. She understands all these issues. All questions are answered. She agrees with this plan      If we put a Port-A-Cath and she understands the risks of bleeding infection pneumothorax cardiac arrhythmia multiple attempts and malfunction      We will go ahead and schedule the breast surgery We will check with the oncologist in [facility] and add Port-A-Cath to the schedule if they definitely planned chemotherapy We will check the CT chest abdomen and pelvis reports from [facility]     Addendum Note Her oncologist has requested Port-A-Cath insertion. I discussed this with her. I added this to her operating posting sheet  CT scan performed at [facility] on May 18, 2018 shows no evidence of malignancy and the chest abdomen or pelvis. No supraclavicular or axillary adenopathy. There is a 3.5 mm right upper lobe subpleural pulmonary nodule thought to be benign. Follow-up recommended. I told her that her oncologist will address this in her long-term follow-up. I Called her and discussed the CT scan report with her. She appreciated the call.  We are scheduled for surgery in early January  Allergies No Known Drug Allergies  Allergies Reconciled  Morphine Derivatives   Medication History  oxyCODONE-Acetaminophen (5-325MG Tablet, Oral) Active. Citalopram Hydrobromide (10MG Tablet, Oral) Active. Toviaz (4MG Tablet ER 24HR, Oral) Active. valACYclovir HCl (1GM Tablet, Oral) Active. DULoxetine  HCl (60MG Capsule DR Part, Oral) Active. Vitamin D (Ergocalciferol) (50000UNIT Capsule, Oral) Active. Flexeril (5MG Tablet, Oral) Active. Triamterene (50MG Capsule, Oral) Active. Medications Reconciled  Vitals  Weight: 170.5 lb Height: 63in Body Surface Area: 1.81 m Body Mass Index: 30.2 kg/m  Temp.: 98.29F  Pulse: 79 (Regular)  BP: 132/80 (Sitting, Left Arm, Standard)     Physical Exam  General Mental Status-Alert. General Appearance-Not in acute distress. Build & Nutrition-Well nourished. Posture-Normal posture. Gait-Normal.  Head and Neck Head-normocephalic, atraumatic with no lesions or palpable masses. Trachea-midline. Thyroid Gland Characteristics - normal size and consistency and no palpable nodules. Note: Craniotomy scar Tracheostomy scar   Chest and Lung Exam Chest and lung exam reveals -on auscultation, normal breath sounds, no adventitious sounds and normal vocal resonance.  Breast Note: Breasts are large and ptotic. Biopsy site medial left breast. No hematoma or palpable mass. No other skin changes. No axillary adenopathy on either side.   Cardiovascular Cardiovascular examination reveals -normal heart sounds, regular rate and rhythm with no murmurs and femoral artery auscultation bilaterally reveals normal pulses, no bruits, no thrills.  Abdomen Inspection Inspection of the abdomen reveals - No Hernias. Palpation/Percussion Palpation and Percussion of the abdomen reveal - Soft, Non Tender, No Rigidity (guarding), No hepatosplenomegaly and No Palpable abdominal masses.  Neurologic Neurologic evaluation reveals -alert and oriented x 3 with no impairment of recent or remote memory, normal attention span and ability to concentrate, normal sensation and normal coordination.  Musculoskeletal Normal Exam - Bilateral-Upper Extremity Strength Normal and Lower Extremity Strength Normal.    Assessment & Plan PRIMARY  CANCER OF UPPER INNER QUADRANT OF LEFT FEMALE BREAST (C50.212)   You have seen the medical oncologist, Dr. Candie Echevaria, at [facility] in [place] Apparently they told you that you might get chemotherapy We will call them to see if they want a Port-A-Cath   Here in [place] we discussed your case in our breast conference. The area of calcifications is 11 mm  CT scans were done in [place] and we discussed those results by phone.  We talked about surgical options once more. You are in favor of breast conservation and I think you're a good candidate for that  You'll be scheduled for left breast lumpectomy with radioactive seed localization, injection of blue dye, left axillary sentinel lymph node biopsy We have discussed the indications, techniques, and risk of this surgery in detail Your oncologist in [place] requests  a Port-A-Cath...we will add that to the surgical procedure  HISTORY OF MENINGIOMA OF THE BRAIN (Z86.011) HYPERTENSION, ESSENTIAL, BENIGN (I10) HISTORY OF TOTAL ABDOMINAL HYSTERECTOMY AND BILATERAL SALPINGO-OOPHORECTOMY (Z90.710) HISTORY OF TRACHEOSTOMY (Z98.890) HISTORY OF KNEE REPLACEMENT, TOTAL, BILATERAL (D35.701) FIBROMYALGIA (M79.7)    Edsel Petrin. Dalbert Batman, M.D., [facility] General and Minimally invasive Surgery Breast and Colorectal Surgery Office:   [phone] Pager:   [phone]

## 2018-06-12 ENCOUNTER — Other Ambulatory Visit: Payer: Self-pay

## 2018-06-12 ENCOUNTER — Ambulatory Visit
Admission: RE | Admit: 2018-06-12 | Discharge: 2018-06-12 | Disposition: A | Discharge status: Home / Self Care | Payer: Medicare Other | Source: Ambulatory Visit | Attending: General Surgery | Admitting: General Surgery

## 2018-06-12 ENCOUNTER — Ambulatory Visit (HOSPITAL_BASED_OUTPATIENT_CLINIC_OR_DEPARTMENT_OTHER)
Admission: RE | Admit: 2018-06-12 | Discharge: 2018-06-12 | Disposition: A | Discharge status: Home / Self Care | Payer: Medicare Other | Attending: General Surgery | Admitting: General Surgery

## 2018-06-12 ENCOUNTER — Ambulatory Visit (HOSPITAL_BASED_OUTPATIENT_CLINIC_OR_DEPARTMENT_OTHER): Payer: Medicare Other | Admitting: Anesthesiology

## 2018-06-12 ENCOUNTER — Ambulatory Visit (HOSPITAL_COMMUNITY): Payer: Medicare Other

## 2018-06-12 ENCOUNTER — Ambulatory Visit (HOSPITAL_COMMUNITY)
Admission: RE | Admit: 2018-06-12 | Discharge: 2018-06-12 | Disposition: A | Discharge status: Home / Self Care | Payer: Medicare Other | Source: Ambulatory Visit | Attending: General Surgery | Admitting: General Surgery

## 2018-06-12 ENCOUNTER — Encounter (HOSPITAL_BASED_OUTPATIENT_CLINIC_OR_DEPARTMENT_OTHER)
Admission: RE | Disposition: A | Discharge status: Home / Self Care | Payer: Self-pay | Source: Home / Self Care | Attending: General Surgery

## 2018-06-12 ENCOUNTER — Ambulatory Visit (HOSPITAL_COMMUNITY): Admission: RE | Admit: 2018-06-12 | Payer: Medicare Other | Source: Ambulatory Visit | Admitting: General Surgery

## 2018-06-12 ENCOUNTER — Encounter (HOSPITAL_BASED_OUTPATIENT_CLINIC_OR_DEPARTMENT_OTHER): Payer: Self-pay

## 2018-06-12 DIAGNOSIS — Z79899 Other long term (current) drug therapy: Secondary | ICD-10-CM | POA: Insufficient documentation

## 2018-06-12 DIAGNOSIS — Z171 Estrogen receptor negative status [ER-]: Principal | ICD-10-CM

## 2018-06-12 DIAGNOSIS — C50412 Malignant neoplasm of upper-outer quadrant of left female breast: Secondary | ICD-10-CM

## 2018-06-12 DIAGNOSIS — Z452 Encounter for adjustment and management of vascular access device: Secondary | ICD-10-CM | POA: Diagnosis not present

## 2018-06-12 DIAGNOSIS — Z823 Family history of stroke: Secondary | ICD-10-CM | POA: Insufficient documentation

## 2018-06-12 DIAGNOSIS — M797 Fibromyalgia: Secondary | ICD-10-CM | POA: Insufficient documentation

## 2018-06-12 DIAGNOSIS — Z803 Family history of malignant neoplasm of breast: Secondary | ICD-10-CM | POA: Diagnosis not present

## 2018-06-12 DIAGNOSIS — I10 Essential (primary) hypertension: Secondary | ICD-10-CM | POA: Insufficient documentation

## 2018-06-12 DIAGNOSIS — G8918 Other acute postprocedural pain: Secondary | ICD-10-CM | POA: Diagnosis not present

## 2018-06-12 DIAGNOSIS — Z95828 Presence of other vascular implants and grafts: Secondary | ICD-10-CM

## 2018-06-12 DIAGNOSIS — Z800 Family history of malignant neoplasm of digestive organs: Secondary | ICD-10-CM | POA: Insufficient documentation

## 2018-06-12 DIAGNOSIS — Z801 Family history of malignant neoplasm of trachea, bronchus and lung: Secondary | ICD-10-CM | POA: Insufficient documentation

## 2018-06-12 DIAGNOSIS — Z86011 Personal history of benign neoplasm of the brain: Secondary | ICD-10-CM | POA: Insufficient documentation

## 2018-06-12 DIAGNOSIS — R928 Other abnormal and inconclusive findings on diagnostic imaging of breast: Secondary | ICD-10-CM | POA: Diagnosis not present

## 2018-06-12 DIAGNOSIS — C50912 Malignant neoplasm of unspecified site of left female breast: Secondary | ICD-10-CM | POA: Diagnosis not present

## 2018-06-12 DIAGNOSIS — R918 Other nonspecific abnormal finding of lung field: Secondary | ICD-10-CM | POA: Diagnosis not present

## 2018-06-12 DIAGNOSIS — C50212 Malignant neoplasm of upper-inner quadrant of left female breast: Secondary | ICD-10-CM | POA: Diagnosis not present

## 2018-06-12 HISTORY — DX: Malignant neoplasm of upper-outer quadrant of left female breast: C50.412

## 2018-06-12 HISTORY — PX: BREAST LUMPECTOMY WITH RADIOACTIVE SEED AND SENTINEL LYMPH NODE BIOPSY: SHX6550

## 2018-06-12 HISTORY — PX: PORTACATH PLACEMENT: SHX2246

## 2018-06-12 SURGERY — BREAST LUMPECTOMY WITH RADIOACTIVE SEED AND SENTINEL LYMPH NODE BIOPSY
Anesthesia: General | Site: Chest | Laterality: Right

## 2018-06-12 MED ORDER — MEPERIDINE HCL 25 MG/ML IJ SOLN: 6.2500 mg | INTRAMUSCULAR | Status: DC | PRN

## 2018-06-12 MED ORDER — CELECOXIB 200 MG PO CAPS
ORAL_CAPSULE | ORAL | Status: AC
  Filled 2018-06-12: qty 1

## 2018-06-12 MED ORDER — HYDROMORPHONE HCL 1 MG/ML IJ SOLN
INTRAMUSCULAR | Status: AC
  Filled 2018-06-12: qty 0.5

## 2018-06-12 MED ORDER — HYDROMORPHONE HCL 1 MG/ML IJ SOLN
0.2500 mg | INTRAMUSCULAR | Status: DC | PRN
  Administered 2018-06-12: 0.25 mg via INTRAVENOUS
  Administered 2018-06-12: 0.5 mg via INTRAVENOUS
  Administered 2018-06-12: 0.25 mg via INTRAVENOUS

## 2018-06-12 MED ORDER — LACTATED RINGERS IV SOLN
INTRAVENOUS | Status: DC
  Administered 2018-06-12 (×2): via INTRAVENOUS

## 2018-06-12 MED ORDER — CHLORHEXIDINE GLUCONATE CLOTH 2 % EX PADS: 6.0000 | MEDICATED_PAD | Freq: Once | CUTANEOUS | Status: DC

## 2018-06-12 MED ORDER — GABAPENTIN 300 MG PO CAPS
300.0000 mg | ORAL_CAPSULE | ORAL | Status: AC
  Administered 2018-06-12: 300 mg via ORAL

## 2018-06-12 MED ORDER — OXYCODONE HCL 5 MG PO TABS
5.0000 mg | ORAL_TABLET | Freq: Once | ORAL | Status: AC | PRN
  Administered 2018-06-12: 5 mg via ORAL

## 2018-06-12 MED ORDER — OXYCODONE HCL 5 MG PO TABS
ORAL_TABLET | ORAL | Status: AC
  Filled 2018-06-12: qty 1

## 2018-06-12 MED ORDER — MIDAZOLAM HCL 2 MG/2ML IJ SOLN
INTRAMUSCULAR | Status: AC
  Filled 2018-06-12: qty 2

## 2018-06-12 MED ORDER — FENTANYL CITRATE (PF) 100 MCG/2ML IJ SOLN: 25.0000 ug | INTRAMUSCULAR | Status: DC | PRN

## 2018-06-12 MED ORDER — DEXAMETHASONE SODIUM PHOSPHATE 4 MG/ML IJ SOLN
INTRAMUSCULAR | Status: DC | PRN
  Administered 2018-06-12: 10 mg via INTRAVENOUS

## 2018-06-12 MED ORDER — ROPIVACAINE HCL 5 MG/ML IJ SOLN
INTRAMUSCULAR | Status: DC | PRN
  Administered 2018-06-12: 30 mL via PERINEURAL

## 2018-06-12 MED ORDER — HYDROCODONE-ACETAMINOPHEN 5-325 MG PO TABS: 1.0000 | ORAL_TABLET | Freq: Four times a day (QID) | ORAL | 0 refills | Status: DC | PRN

## 2018-06-12 MED ORDER — CEFAZOLIN SODIUM-DEXTROSE 2-4 GM/100ML-% IV SOLN
INTRAVENOUS | Status: AC
  Filled 2018-06-12: qty 100

## 2018-06-12 MED ORDER — ONDANSETRON HCL 4 MG/2ML IJ SOLN
INTRAMUSCULAR | Status: DC | PRN
  Administered 2018-06-12: 4 mg via INTRAVENOUS

## 2018-06-12 MED ORDER — ACETAMINOPHEN 500 MG PO TABS
ORAL_TABLET | ORAL | Status: AC
  Filled 2018-06-12: qty 2

## 2018-06-12 MED ORDER — SODIUM CHLORIDE 0.9% FLUSH: 3.0000 mL | Freq: Two times a day (BID) | INTRAVENOUS | Status: DC

## 2018-06-12 MED ORDER — OXYCODONE HCL 5 MG/5ML PO SOLN: 5.0000 mg | Freq: Once | ORAL | Status: AC | PRN

## 2018-06-12 MED ORDER — LACTATED RINGERS IV SOLN: INTRAVENOUS | Status: DC

## 2018-06-12 MED ORDER — CELECOXIB 100 MG PO CAPS
ORAL_CAPSULE | ORAL | Status: AC
  Filled 2018-06-12: qty 1

## 2018-06-12 MED ORDER — SODIUM CHLORIDE 0.9 % IV SOLN: 250.0000 mL | INTRAVENOUS | Status: DC | PRN

## 2018-06-12 MED ORDER — DEXAMETHASONE SODIUM PHOSPHATE 10 MG/ML IJ SOLN
INTRAMUSCULAR | Status: AC
  Filled 2018-06-12: qty 1

## 2018-06-12 MED ORDER — HEPARIN (PORCINE) IN NACL 2-0.9 UNITS/ML
INTRAMUSCULAR | Status: AC | PRN
  Administered 2018-06-12: 1 via INTRAVENOUS

## 2018-06-12 MED ORDER — OXYCODONE HCL 5 MG PO TABS: 5.0000 mg | ORAL_TABLET | ORAL | Status: DC | PRN

## 2018-06-12 MED ORDER — TECHNETIUM TC 99M SULFUR COLLOID FILTERED
1.0000 | Freq: Once | INTRAVENOUS | Status: AC | PRN
  Administered 2018-06-12: 1 via INTRADERMAL

## 2018-06-12 MED ORDER — FENTANYL CITRATE (PF) 100 MCG/2ML IJ SOLN
INTRAMUSCULAR | Status: AC
  Filled 2018-06-12: qty 2

## 2018-06-12 MED ORDER — SODIUM CHLORIDE 0.9% FLUSH: 3.0000 mL | INTRAVENOUS | Status: DC | PRN

## 2018-06-12 MED ORDER — CEFAZOLIN SODIUM-DEXTROSE 2-4 GM/100ML-% IV SOLN
2.0000 g | INTRAVENOUS | Status: AC
  Administered 2018-06-12: 2 g via INTRAVENOUS

## 2018-06-12 MED ORDER — PROPOFOL 10 MG/ML IV BOLUS
INTRAVENOUS | Status: AC
  Filled 2018-06-12: qty 20

## 2018-06-12 MED ORDER — CELECOXIB 100 MG PO CAPS
100.0000 mg | ORAL_CAPSULE | ORAL | Status: AC
  Administered 2018-06-12: 100 mg via ORAL

## 2018-06-12 MED ORDER — BUPIVACAINE-EPINEPHRINE (PF) 0.25% -1:200000 IJ SOLN
INTRAMUSCULAR | Status: DC | PRN
  Administered 2018-06-12: 30 mL

## 2018-06-12 MED ORDER — ACETAMINOPHEN 325 MG PO TABS: 650.0000 mg | ORAL_TABLET | ORAL | Status: DC | PRN

## 2018-06-12 MED ORDER — SCOPOLAMINE 1 MG/3DAYS TD PT72: 1.0000 | MEDICATED_PATCH | Freq: Once | TRANSDERMAL | Status: DC | PRN

## 2018-06-12 MED ORDER — MIDAZOLAM HCL 2 MG/2ML IJ SOLN
1.0000 mg | INTRAMUSCULAR | Status: DC | PRN
  Administered 2018-06-12: 2 mg via INTRAVENOUS

## 2018-06-12 MED ORDER — ACETAMINOPHEN 500 MG PO TABS: 1000.0000 mg | ORAL_TABLET | Freq: Four times a day (QID) | ORAL | Status: DC

## 2018-06-12 MED ORDER — ACETAMINOPHEN 500 MG PO TABS
1000.0000 mg | ORAL_TABLET | ORAL | Status: AC
  Administered 2018-06-12: 1000 mg via ORAL

## 2018-06-12 MED ORDER — LIDOCAINE 2% (20 MG/ML) 5 ML SYRINGE
INTRAMUSCULAR | Status: AC
  Filled 2018-06-12: qty 5

## 2018-06-12 MED ORDER — GABAPENTIN 300 MG PO CAPS
ORAL_CAPSULE | ORAL | Status: AC
  Filled 2018-06-12: qty 1

## 2018-06-12 MED ORDER — FENTANYL CITRATE (PF) 100 MCG/2ML IJ SOLN
50.0000 ug | INTRAMUSCULAR | Status: AC | PRN
  Administered 2018-06-12: 25 ug via INTRAVENOUS
  Administered 2018-06-12 (×2): 50 ug via INTRAVENOUS
  Administered 2018-06-12: 25 ug via INTRAVENOUS

## 2018-06-12 MED ORDER — SODIUM CHLORIDE (PF) 0.9 % IJ SOLN
INTRAVENOUS | Status: DC | PRN
  Administered 2018-06-12: 5 mL via INTRAMUSCULAR

## 2018-06-12 MED ORDER — ACETAMINOPHEN 650 MG RE SUPP: 650.0000 mg | RECTAL | Status: DC | PRN

## 2018-06-12 MED ORDER — PROMETHAZINE HCL 25 MG/ML IJ SOLN: 6.2500 mg | INTRAMUSCULAR | Status: DC | PRN

## 2018-06-12 MED ORDER — HEPARIN SOD (PORK) LOCK FLUSH 100 UNIT/ML IV SOLN
INTRAVENOUS | Status: DC | PRN
  Administered 2018-06-12: 500 [IU] via INTRAVENOUS

## 2018-06-12 MED ORDER — LIDOCAINE HCL (CARDIAC) PF 100 MG/5ML IV SOSY
PREFILLED_SYRINGE | INTRAVENOUS | Status: DC | PRN
  Administered 2018-06-12: 60 mg via INTRAVENOUS

## 2018-06-12 MED ORDER — PROPOFOL 10 MG/ML IV BOLUS
INTRAVENOUS | Status: DC | PRN
  Administered 2018-06-12: 200 mg via INTRAVENOUS

## 2018-06-12 MED ORDER — ONDANSETRON HCL 4 MG/2ML IJ SOLN
INTRAMUSCULAR | Status: AC
  Filled 2018-06-12: qty 2

## 2018-06-12 SURGICAL SUPPLY — 84 items
ADH SKN CLS APL DERMABOND .7 (GAUZE/BANDAGES/DRESSINGS) ×2
APPLIER CLIP 11 MED OPEN (CLIP) ×4
BAG DECANTER FOR FLEXI CONT (MISCELLANEOUS) ×4 IMPLANT
BANDAGE ACE 6X5 VEL STRL LF (GAUZE/BANDAGES/DRESSINGS) IMPLANT
BENZOIN TINCTURE PRP APPL 2/3 (GAUZE/BANDAGES/DRESSINGS) IMPLANT
BINDER BREAST LRG (GAUZE/BANDAGES/DRESSINGS) IMPLANT
BINDER BREAST MEDIUM (GAUZE/BANDAGES/DRESSINGS) IMPLANT
BINDER BREAST XLRG (GAUZE/BANDAGES/DRESSINGS) IMPLANT
BINDER BREAST XXLRG (GAUZE/BANDAGES/DRESSINGS) ×2 IMPLANT
BLADE HEX COATED 2.75 (ELECTRODE) ×4 IMPLANT
BLADE SURG 15 STRL LF DISP TIS (BLADE) ×4 IMPLANT
BLADE SURG 15 STRL SS (BLADE) ×4
CANISTER SUCT 1200ML W/VALVE (MISCELLANEOUS) ×4 IMPLANT
CHLORAPREP W/TINT 26ML (MISCELLANEOUS) ×6 IMPLANT
CLIP APPLIE 11 MED OPEN (CLIP) ×2 IMPLANT
CLOSURE WOUND 1/2 X4 (GAUZE/BANDAGES/DRESSINGS)
COVER BACK TABLE 60X90IN (DRAPES) ×4 IMPLANT
COVER MAYO STAND STRL (DRAPES) ×4 IMPLANT
COVER PROBE 5X48 (MISCELLANEOUS) ×2
COVER PROBE W GEL 5X96 (DRAPES) ×4 IMPLANT
COVER WAND RF STERILE (DRAPES) IMPLANT
DECANTER SPIKE VIAL GLASS SM (MISCELLANEOUS) ×2 IMPLANT
DERMABOND ADVANCED (GAUZE/BANDAGES/DRESSINGS) ×4
DERMABOND ADVANCED .7 DNX12 (GAUZE/BANDAGES/DRESSINGS) ×2 IMPLANT
DRAPE C-ARM 42X72 X-RAY (DRAPES) ×4 IMPLANT
DRAPE LAPAROSCOPIC ABDOMINAL (DRAPES) ×6 IMPLANT
DRAPE UTILITY XL STRL (DRAPES) ×6 IMPLANT
DRSG PAD ABDOMINAL 8X10 ST (GAUZE/BANDAGES/DRESSINGS) ×4 IMPLANT
DRSG TEGADERM 2-3/8X2-3/4 SM (GAUZE/BANDAGES/DRESSINGS) IMPLANT
DRSG TEGADERM 4X10 (GAUZE/BANDAGES/DRESSINGS) IMPLANT
DRSG TEGADERM 4X4.75 (GAUZE/BANDAGES/DRESSINGS) IMPLANT
ELECT REM PT RETURN 9FT ADLT (ELECTROSURGICAL) ×4
ELECTRODE REM PT RTRN 9FT ADLT (ELECTROSURGICAL) ×2 IMPLANT
GAUZE SPONGE 4X4 12PLY STRL (GAUZE/BANDAGES/DRESSINGS) ×4 IMPLANT
GAUZE SPONGE 4X4 12PLY STRL LF (GAUZE/BANDAGES/DRESSINGS) IMPLANT
GLOVE BIO SURGEON STRL SZ7 (GLOVE) ×2 IMPLANT
GLOVE BIOGEL PI IND STRL 7.0 (GLOVE) IMPLANT
GLOVE BIOGEL PI IND STRL 7.5 (GLOVE) IMPLANT
GLOVE BIOGEL PI INDICATOR 7.0 (GLOVE) ×2
GLOVE BIOGEL PI INDICATOR 7.5 (GLOVE) ×2
GLOVE EUDERMIC 7 POWDERFREE (GLOVE) ×6 IMPLANT
GOWN STRL REUS W/ TWL LRG LVL3 (GOWN DISPOSABLE) ×2 IMPLANT
GOWN STRL REUS W/ TWL XL LVL3 (GOWN DISPOSABLE) ×2 IMPLANT
GOWN STRL REUS W/TWL LRG LVL3 (GOWN DISPOSABLE) ×2
GOWN STRL REUS W/TWL XL LVL3 (GOWN DISPOSABLE) ×4
ILLUMINATOR WAVEGUIDE N/F (MISCELLANEOUS) IMPLANT
IV CATH PLACEMENT UNIT 16 GA (IV SOLUTION) IMPLANT
IV KIT MINILOC 20X1 SAFETY (NEEDLE) IMPLANT
KIT CVR 48X5XPRB PLUP LF (MISCELLANEOUS) ×2 IMPLANT
KIT MARKER MARGIN INK (KITS) ×4 IMPLANT
KIT PORT POWER 8FR ISP CVUE (Port) ×4 IMPLANT
LIGHT WAVEGUIDE WIDE FLAT (MISCELLANEOUS) IMPLANT
NDL BLUNT 17GA (NEEDLE) IMPLANT
NDL HYPO 25X1 1.5 SAFETY (NEEDLE) ×4 IMPLANT
NDL SAFETY ECLIPSE 18X1.5 (NEEDLE) ×2 IMPLANT
NEEDLE BLUNT 17GA (NEEDLE) IMPLANT
NEEDLE HYPO 18GX1.5 SHARP (NEEDLE) ×2
NEEDLE HYPO 22GX1.5 SAFETY (NEEDLE) ×2 IMPLANT
NEEDLE HYPO 25X1 1.5 SAFETY (NEEDLE) ×8 IMPLANT
NS IRRIG 1000ML POUR BTL (IV SOLUTION) ×4 IMPLANT
PACK BASIN DAY SURGERY FS (CUSTOM PROCEDURE TRAY) ×4 IMPLANT
PAD ALCOHOL SWAB (MISCELLANEOUS) ×4 IMPLANT
PENCIL BUTTON HOLSTER BLD 10FT (ELECTRODE) ×4 IMPLANT
SET SHEATH INTRODUCER 10FR (MISCELLANEOUS) IMPLANT
SHEATH COOK PEEL AWAY SET 9F (SHEATH) IMPLANT
SHEET MEDIUM DRAPE 40X70 STRL (DRAPES) ×4 IMPLANT
SLEEVE SCD COMPRESS KNEE MED (MISCELLANEOUS) ×4 IMPLANT
SPONGE LAP 4X18 RFD (DISPOSABLE) ×6 IMPLANT
STRIP CLOSURE SKIN 1/2X4 (GAUZE/BANDAGES/DRESSINGS) IMPLANT
SUT MNCRL AB 4-0 PS2 18 (SUTURE) ×8 IMPLANT
SUT PROLENE 2 0 CT2 30 (SUTURE) ×4 IMPLANT
SUT SILK 2 0 SH (SUTURE) ×4 IMPLANT
SUT VIC AB 2-0 CT1 27 (SUTURE)
SUT VIC AB 2-0 CT1 TAPERPNT 27 (SUTURE) IMPLANT
SUT VIC AB 3-0 SH 27 (SUTURE)
SUT VIC AB 3-0 SH 27X BRD (SUTURE) IMPLANT
SUT VICRYL 3-0 CR8 SH (SUTURE) ×4 IMPLANT
SYR 10ML LL (SYRINGE) ×8 IMPLANT
SYR 5ML LUER SLIP (SYRINGE) ×4 IMPLANT
TOWEL GREEN STERILE FF (TOWEL DISPOSABLE) ×8 IMPLANT
TRAY FAXITRON CT DISP (TRAY / TRAY PROCEDURE) ×2 IMPLANT
TUBE CONNECTING 20'X1/4 (TUBING) ×1
TUBE CONNECTING 20X1/4 (TUBING) ×3 IMPLANT
YANKAUER SUCT BULB TIP NO VENT (SUCTIONS) ×4 IMPLANT

## 2018-06-12 NOTE — Op Note (Addendum)
Patient Name:           Jacqueline Christian   Date of Surgery:        06/12/2018  Pre op Diagnosis:      Cancer left breast, upper outer quadrant, receptor negative, HER-2 positive  Post op Diagnosis:    Same  Procedure:                 Inject blue dye left breast,                                      left breast lumpectomy with radioactive seed localization,                                      left axillary deep sentinel lymph node biopsy,                                       insertion PowerPort [place] 8 French tunneled venous vascular access device,                                       use of fluoroscopy for guidance and positioning  Surgeon:                     Edsel Petrin. Dalbert Batman, M.D., FACS  Assistant:                      OR staff   Operative Indications:    This is a very pleasant 73 year old female from [place]. She is brought to the operating room for definitive surgery for her left breast cancer. Her oncologist is Dr. Candie Echevaria at [facility] in [place].   her PCP is Dr.Vyas.      No prior breast problems. Imaging studies show a small area of calcifications thought to be 11 mm in diameter. Left breast upper outer quadrant. Image guided biopsy performed at [facility] shows a small focus of invasive ductal carcinoma and DCIS. ER and PR negative. HER-2 positive. It is not clear how big the invasive component is. . She has now been seen by medical oncology in [place]. They request Port-A-Cath insertion.  Patient states they told her she will get chemotherapy.       Comorbidities include history of craniotomy for meningioma 2013 by Dr. Vertell Limber. Slight memory disorder. Bilateral total knee replacement. Tracheostomy due to complications following craniotomy down doing well. History TAH and BSO. Fibromyalgia. Hypertension. Family history negative for ovarian cancer and pancreatic cancer or prostate cancer. An aunt had breast cancer. Sister had lung cancer. Mother deceased from colon  cancer. Father had a stroke.      She is strongly motivated for breast conservation and I think she is a good candidate for that She'll be scheduled for injection blue dye left breast, left breast lumpectomy with RSL, left axillary sentinel lymph node biopsy, insert port a cath. I discussed the indications, details, techniques, and risk of the surgery in detail with her and her husband.  She agrees with this plan. CT scan performed at [facility] on May 18, 2018 shows no evidence of malignancy and  the chest abdomen or pelvis. No supraclavicular or axillary adenopathy. There is a 3.5 mm right upper lobe subpleural pulmonary nodule thought to be benign. Follow-up recommended. I told her that her oncologist will address this in her long-term follow-up.   Operative Findings:       The Port-A-Cath was inserted through the right subclavian vein.  C arm imaging at the completion of the procedure looked good with good positioning of the catheter tip in the superior vena cava.  The catheter flushed easily and had excellent blood return and was flushed with concentrated heparin.  The lumpectomy specimen from the left breast upper outer quadrant looked good on specimen mammogram.  The seed and the original marker clip were in the center of the specimen, suggesting good margins.  I found 3 or 4 sentinel lymph nodes which were sent as separate specimens.  Procedure in Detail:          Pectoral block was performed preop by anesthesia.  Technetium injection was performed by the nuclear medicine technician.  The patient was taken to the operating room.  General anesthesia with LMA was induced.  Surgical timeout was performed.  Intravenous antibiotics were given.  The patient was positioned with a roll behind her shoulders and her arms tucked at her side.  Following skin prep I injected 5 cc of dilute methylene blue into the left breast subareolar area and massaged the breast for a couple of minutes.        Throughout the procedures I used 0.25% Marcaine with epinephrine as a local infiltration anesthetic.      The neck and chest wall were then prepped and draped in a sterile fashion.  A right subclavian venipuncture was performed with good blood return.  Guidewire was inserted into the superior vena cava under fluoroscopic guidance.  A small incision was made at the wire insertion site.  Using the C arm I marked a template on the chest wall to guide positioning of the catheter so that the catheter tip would be in the superior vena cava above the right atrium.  A transverse incision was made below the midpoint of the right clavicle.  Subcutaneous pocket was created.  Using a tunneling device I passed the catheter from the port pocket site to the wire insertion site.  Using the template on the chest wall I measured the catheter and cut it almost 25 cm in length.  The catheter was secured to the port with the locking device and the port and catheter flushed with heparinized saline.  The port was sutured to the pectoralis fascia with 3 interrupted sutures of 2-0 Prolene.  The dilator and peel-away sheath assembly was inserted over the guidewire into the central venous circulation.  The wire and dilator were removed..  The catheter was threaded easily.  The peel-away sheath was removed.  The catheter flushed easily and had excellent blood return.  I then flushed the port and catheter with concentrated heparin.  The wounds were clean without bleeding.  The subcutaneous tissues were closed with interrupted 3-0 Vicryl and the skin closed with subcuticular 4-0 Monocryl and Dermabond.      The patient was then repositioned, reprepped and redraped for the left breast surgery.  Using the neoprobe I identified the area of maximal radioactivity in the upper outer left breast.  A curvilinear incision was made in the upper outer quadrant of the left breast.  The lumpectomy was then performed with the neoprobe and electrocautery.   The  specimen was removed and marked with silk sutures and a 6 color ink kit.  Specimen mammogram looked very good as described above.  The specimen was sent to the lab where the seed was retrieved.  The wound was irrigated.  Hemostasis was excellent.  5 metal marker clips were placed in the walls of the lumpectomy cavity.  The breast tissues were reapproximated in layers with interrupted 3-0 Vicryl and the skin closed with a running subcuticular 4-0 Monocryl and Dermabond.     The neoprobe was changed over to the technetium setting.  A transverse incision was made at the hairline in the left axilla.  Dissection was carried down through the clavipectoral fascia.  Once I entered the axillary space to use the neoprobe and found 3 or 4 sentinel lymph nodes which were very hot and blue.  These were sent in 1 jar as a specimen.  After these were removed there was no more radioactivity or blue dye that I can see.  The wound was irrigated.  Hemostasis was excellent.  The clavipectoral fascia was closed with 3-0 Vicryl sutures and the skin closed with a running subcuticular 4-0 Monocryl and Dermabond.  Clean bandages and a breast binder were placed.  Patient tolerated the procedure well and was taken to PACU in stable condition.  A chest x-ray is planned.    EBL 25 cc.  Counts correct.  Complications none.    Addendum: I logged onto the [employer] and reviewed her prescription medication history     Yahsir Wickens M. Dalbert Batman, M.D., FACS General and Minimally Invasive Surgery Breast and Colorectal Surgery  06/12/2018 12:03 PM

## 2018-06-12 NOTE — Interval H&P Note (Signed)
History and Physical Interval Note:  06/12/2018 9:34 AM  Jacqueline Christian  has presented today for surgery, with the diagnosis of LEFT BREAST CANCER  The various methods of treatment have been discussed with the patient and family. After consideration of risks, benefits and other options for treatment, the patient has consented to  Procedure(s): LEFT BREAST LUMPECTOMY WITH RADIOACTIVE SEED AND LEFT AXILLARY DEEP SENTINEL LYMPH NODE BIOPSY INJECT BLUE DYE LEFT BREAST (Left) INSERTION PORT-A-CATH (N/A) as a surgical intervention .  The patient's history has been reviewed, patient examined, no change in status, stable for surgery.  I have reviewed the patient's chart and labs.  Questions were answered to the patient's satisfaction.     Adin Hector

## 2018-06-12 NOTE — Anesthesia Procedure Notes (Signed)
Anesthesia Regional Block: Pectoralis block   Pre-Anesthetic Checklist: ,, timeout performed, Correct Patient, Correct Site, Correct Laterality, Correct Procedure, Correct Position, site marked, Risks and benefits discussed,  Surgical consent,  Pre-op evaluation,  At surgeon's request and post-op pain management  Laterality: Left  Prep: chloraprep       Needles:  Injection technique: Single-shot  Needle Type: Stimiplex     Needle Length: 9cm  Needle Gauge: 21     Additional Needles:   Procedures:,,,, ultrasound used (permanent image in chart),,,,  Narrative:  Start time: 06/12/2018 9:21 AM End time: 06/12/2018 9:26 AM Injection made incrementally with aspirations every 5 mL.  Performed by: Personally  Anesthesiologist: Lynda Rainwater, MD

## 2018-06-12 NOTE — Anesthesia Procedure Notes (Signed)
Procedure Name: LMA Insertion Date/Time: 06/12/2018 10:14 AM Performed by: Lyndee Leo, CRNA Pre-anesthesia Checklist: Patient identified, Emergency Drugs available, Suction available and Patient being monitored Patient Re-evaluated:Patient Re-evaluated prior to induction Oxygen Delivery Method: Circle system utilized Preoxygenation: Pre-oxygenation with 100% oxygen Induction Type: IV induction Ventilation: Mask ventilation without difficulty LMA: LMA inserted LMA Size: 4.0 Number of attempts: 1 Airway Equipment and Method: Bite block Placement Confirmation: positive ETCO2 Tube secured with: Tape Dental Injury: Teeth and Oropharynx as per pre-operative assessment

## 2018-06-12 NOTE — Discharge Instructions (Signed)
[facility] Phone Number [phone]  BREAST BIOPSY/ LUMPECTOMY: POST OP INSTRUCTIONS  Always review your discharge instruction sheet given to you by the facility where your surgery was performed.  IF YOU HAVE DISABILITY OR FAMILY LEAVE FORMS, YOU MUST BRING THEM TO THE OFFICE FOR PROCESSING.  DO NOT GIVE THEM TO YOUR DOCTOR.  1. A prescription for pain medication may be given to you upon discharge.  Take your pain medication as prescribed, if needed.  If narcotic pain medicine is not needed, then you may take acetaminophen (Tylenol) or ibuprofen (Advil) as needed. No Tylenol until 2:48pm if needed. No Ibuprofen until 3:48pm if needed 2. Take your usually prescribed medications unless otherwise directed 3. If you need a refill on your pain medication, please contact your pharmacy.  They will contact our office to request authorization.  Prescriptions will not be filled after 5pm or on week-ends. 4. You should eat very light the first 24 hours after surgery, such as soup, crackers, pudding, etc.  Resume your normal diet the day after surgery. 5. Most patients will experience some swelling and bruising in the breast.  Ice packs and a good support bra will help.  Swelling and bruising can take several days to resolve.  6. It is common to experience some constipation if taking pain medication after surgery.  Increasing fluid intake and taking a stool softener will usually help or prevent this problem from occurring.  A mild laxative (Milk of Magnesia or Miralax) should be taken according to package directions if there are no bowel movements after 48 hours. 7. Unless discharge instructions indicate otherwise, you may remove your bandages 24-48 hours after surgery, and you may shower at that time.  You may have steri-strips (small skin tapes) in place directly over the incision.  These strips should be left on the skin for 7-10 days.  If your surgeon used skin glue on the incision,  you may shower in 24 hours.  The glue will flake off over the next 2-3 weeks.  Any sutures or staples will be removed at the office during your follow-up visit. 8. ACTIVITIES:  You may resume regular daily activities (gradually increasing) beginning the next day.  Wearing a good support bra or sports bra minimizes pain and swelling.  You may have sexual intercourse when it is comfortable. a. You may drive when you no longer are taking prescription pain medication, you can comfortably wear a seatbelt, and you can safely maneuver your car and apply brakes. b. RETURN TO WORK:  ______________________________________________________________________________________ 9. You should see your doctor in the office for a follow-up appointment approximately two weeks after your surgery.  Your doctors nurse will typically make your follow-up appointment when she calls you with your pathology report.  Expect your pathology report 2-3 business days after your surgery.  You may call to check if you do not hear from [place] after three days. 10. OTHER INSTRUCTIONS: _______________________________________________________________________________________________ _____________________________________________________________________________________________________________________________________ _____________________________________________________________________________________________________________________________________ _____________________________________________________________________________________________________________________________________  WHEN TO CALL YOUR DOCTOR: 1. Fever over 101.0 2. Nausea and/or vomiting. 3. Extreme swelling or bruising. 4. Continued bleeding from incision. 5. Increased pain, redness, or drainage from the incision.  The clinic staff is available to answer your questions during regular business hours.  Please dont hesitate to call and ask to speak to one of the nurses for clinical  concerns.  If you have a medical emergency, go to the nearest emergency room or call 911.  A surgeon from [facility] is always on call  at the hospital.  For further questions, please visit [email or web address]           PORT-A-CATH: POST OP INSTRUCTIONS  Always review your discharge instruction sheet given to you by the facility where your surgery was performed.   1. A prescription for pain medication may be given to you upon discharge. Take your pain medication as prescribed, if needed. If narcotic pain medicine is not needed, then you make take acetaminophen (Tylenol) or ibuprofen (Advil) as needed.  2. Take your usually prescribed medications unless otherwise directed. 3. If you need a refill on your pain medication, please contact our office. All narcotic pain medicine now requires a paper prescription.  Phoned in and fax refills are no longer allowed by law.  Prescriptions will not be filled after 5 pm or on weekends.  4. You should follow a light diet for the remainder of the day after your procedure. 5. Most patients will experience some mild swelling and/or bruising in the area of the incision. It may take several days to resolve. 6. It is common to experience some constipation if taking pain medication after surgery. Increasing fluid intake and taking a stool softener (such as Colace) will usually help or prevent this problem from occurring. A mild laxative (Milk of Magnesia or Miralax) should be taken according to package directions if there are no bowel movements after 48 hours.  7. Unless discharge instructions indicate otherwise, you may remove your bandages 48 hours after surgery, and you may shower at that time. You may have steri-strips (small white skin tapes) in place directly over the incision.  These strips should be left on the skin for 7-10 days.  If your surgeon used Dermabond (skin glue) on the incision, you may shower in 24 hours.  The glue will  flake off over the next 2-3 weeks.  8. If your port is left accessed at the end of surgery (needle left in port), the dressing cannot get wet and should only by changed by a healthcare professional. When the port is no longer accessed (when the needle has been removed), follow step 7.   9. ACTIVITIES:  Limit activity involving your arms for the next 72 hours. Do no strenuous exercise or activity for 1 week. You may drive when you are no longer taking prescription pain medication, you can comfortably wear a seatbelt, and you can maneuver your car. 10.You may need to see your doctor in the office for a follow-up appointment.  Please       check with your doctor.  11.When you receive a new Port-a-Cath, you will get a product guide and        ID card.  Please keep them in case you need them.  WHEN TO CALL YOUR DOCTOR [phone]): 1. Fever over 101.0 2. Chills 3. Continued bleeding from incision 4. Increased redness and tenderness at the site 5. Shortness of breath, difficulty breathing   The clinic staff is available to answer your questions during regular business hours. Please dont hesitate to call and ask to speak to one of the nurses or medical assistants for clinical concerns. If you have a medical emergency, go to the nearest emergency room or call 911.  A surgeon from [facility] is always on call at the hospital.     For further information, please visit [email or web address]   Post Anesthesia Home Care Instructions  Activity: Get plenty of rest for the remainder of the day. A responsible individual must stay with  you for 24 hours following the procedure.  For the next 24 hours, DO NOT: -Drive a car -Paediatric nurse -Drink alcoholic beverages -Take any medication unless instructed by your physician -Make any legal decisions or sign important papers.  Meals: Start with liquid foods such as gelatin or soup. Progress to regular foods as tolerated. Avoid  greasy, spicy, heavy foods. If nausea and/or vomiting occur, drink only clear liquids until the nausea and/or vomiting subsides. Call your physician if vomiting continues.  Special Instructions/Symptoms: Your throat may feel dry or sore from the anesthesia or the breathing tube placed in your throat during surgery. If this causes discomfort, gargle with warm salt water. The discomfort should disappear within 24 hours.  If you had a scopolamine patch placed behind your ear for the management of post- operative nausea and/or vomiting:  1. The medication in the patch is effective for 72 hours, after which it should be removed.  Wrap patch in a tissue and discard in the trash. Wash hands thoroughly with soap and water. 2. You may remove the patch earlier than 72 hours if you experience unpleasant side effects which may include dry mouth, dizziness or visual disturbances. 3. Avoid touching the patch. Wash your hands with soap and water after contact with the patch.

## 2018-06-12 NOTE — Anesthesia Preprocedure Evaluation (Signed)
Anesthesia Evaluation  Patient identified by MRN, date of birth, ID band Patient awake    Reviewed: Allergy & Precautions, H&P , NPO status , Patient's Chart, lab work & pertinent test results  Airway Mallampati: II  TM Distance: >3 FB Neck ROM: Full    Dental  (+) Teeth Intact, Dental Advisory Given   Pulmonary    breath sounds clear to auscultation       Cardiovascular hypertension, Pt. on medications  Rhythm:Regular Rate:Normal     Neuro/Psych  Headaches, Depression    GI/Hepatic   Endo/Other    Renal/GU      Musculoskeletal  (+) Arthritis , Osteoarthritis,    Abdominal   Peds  Hematology   Anesthesia Other Findings Breast Cancer  Reproductive/Obstetrics                             Anesthesia Physical  Anesthesia Plan  ASA: II  Anesthesia Plan: General   Post-op Pain Management: GA combined w/ Regional for post-op pain   Induction: Intravenous  PONV Risk Score and Plan: 3 and Ondansetron, Dexamethasone and Midazolam  Airway Management Planned: LMA and Oral ETT  Additional Equipment:   Intra-op Plan:   Post-operative Plan: Extubation in OR  Informed Consent: I have reviewed the patients History and Physical, chart, labs and discussed the procedure including the risks, benefits and alternatives for the proposed anesthesia with the patient or authorized representative who has indicated his/her understanding and acceptance.   Dental advisory given  Plan Discussed with: CRNA and Anesthesiologist  Anesthesia Plan Comments:         Anesthesia Quick Evaluation

## 2018-06-12 NOTE — Anesthesia Postprocedure Evaluation (Signed)
Anesthesia Post Note  Patient: Jacqueline Christian  Procedure(s) Performed: LEFT BREAST LUMPECTOMY WITH RADIOACTIVE SEED AND LEFT AXILLARY DEEP SENTINEL LYMPH NODE BIOPSY INJECT BLUE DYE LEFT BREAST (Left Breast) INSERTION PORT-A-CATH (Right Chest)     Patient location during evaluation: PACU Anesthesia Type: General Level of consciousness: awake and alert, awake and oriented Pain management: pain level controlled Vital Signs Assessment: post-procedure vital signs reviewed and stable Respiratory status: spontaneous breathing, nonlabored ventilation and respiratory function stable Cardiovascular status: blood pressure returned to baseline and stable Postop Assessment: no apparent nausea or vomiting Anesthetic complications: no    Last Vitals:  Vitals:   06/12/18 1245 06/12/18 1300  BP: (!) 153/75 (!) 147/74  Pulse: 69 70  Resp: 14 15  Temp:    SpO2: 100% 97%    Last Pain:  Vitals:   06/12/18 1300  TempSrc:   PainSc: 6                  Catalina Gravel

## 2018-06-12 NOTE — Progress Notes (Signed)
Assisted Dr. Miller with left, ultrasound guided, pectoralis block. Side rails up, monitors on throughout procedure. See vital signs in flow sheet. Tolerated Procedure well. 

## 2018-06-12 NOTE — Transfer of Care (Signed)
Immediate Anesthesia Transfer of Care Note  Patient: Jacqueline Christian  Procedure(s) Performed: LEFT BREAST LUMPECTOMY WITH RADIOACTIVE SEED AND LEFT AXILLARY DEEP SENTINEL LYMPH NODE BIOPSY INJECT BLUE DYE LEFT BREAST (Left Breast) INSERTION PORT-A-CATH (Right Chest)  Patient Location: PACU  Anesthesia Type:GA combined with regional for post-op pain  Level of Consciousness: sedated and responds to stimulation  Airway & Oxygen Therapy: Patient Spontanous Breathing and Patient connected to face mask oxygen  Post-op Assessment: Report given to RN and Post -op Vital signs reviewed and stable  Post vital signs: Reviewed and stable  Last Vitals:  Vitals Value Taken Time  BP 145/70 06/12/2018 12:05 PM  Temp    Pulse 64 06/12/2018 12:06 PM  Resp 18 06/12/2018 12:06 PM  SpO2 100 % 06/12/2018 12:06 PM  Vitals shown include unvalidated device data.  Last Pain:  Vitals:   06/12/18 0841  TempSrc: Oral  PainSc: 3       Patients Stated Pain Goal: 3 (07/18/13 5208)  Complications: No apparent anesthesia complications

## 2018-06-14 ENCOUNTER — Encounter (HOSPITAL_BASED_OUTPATIENT_CLINIC_OR_DEPARTMENT_OTHER): Payer: Self-pay | Admitting: General Surgery

## 2018-07-04 DIAGNOSIS — Z171 Estrogen receptor negative status [ER-]: Secondary | ICD-10-CM | POA: Diagnosis not present

## 2018-07-04 DIAGNOSIS — I10 Essential (primary) hypertension: Secondary | ICD-10-CM | POA: Diagnosis not present

## 2018-07-04 DIAGNOSIS — C50212 Malignant neoplasm of upper-inner quadrant of left female breast: Secondary | ICD-10-CM | POA: Diagnosis not present

## 2018-07-12 DIAGNOSIS — Z171 Estrogen receptor negative status [ER-]: Secondary | ICD-10-CM | POA: Diagnosis not present

## 2018-07-12 DIAGNOSIS — I10 Essential (primary) hypertension: Secondary | ICD-10-CM | POA: Diagnosis not present

## 2018-07-12 DIAGNOSIS — C50212 Malignant neoplasm of upper-inner quadrant of left female breast: Secondary | ICD-10-CM | POA: Diagnosis not present

## 2018-07-14 DIAGNOSIS — I351 Nonrheumatic aortic (valve) insufficiency: Secondary | ICD-10-CM | POA: Diagnosis not present

## 2018-07-14 DIAGNOSIS — I10 Essential (primary) hypertension: Secondary | ICD-10-CM | POA: Diagnosis not present

## 2018-07-14 DIAGNOSIS — C50212 Malignant neoplasm of upper-inner quadrant of left female breast: Secondary | ICD-10-CM | POA: Diagnosis not present

## 2018-07-14 DIAGNOSIS — I350 Nonrheumatic aortic (valve) stenosis: Secondary | ICD-10-CM | POA: Diagnosis not present

## 2018-07-18 DIAGNOSIS — Z5112 Encounter for antineoplastic immunotherapy: Secondary | ICD-10-CM | POA: Diagnosis not present

## 2018-07-18 DIAGNOSIS — Z171 Estrogen receptor negative status [ER-]: Secondary | ICD-10-CM | POA: Diagnosis not present

## 2018-07-18 DIAGNOSIS — C50212 Malignant neoplasm of upper-inner quadrant of left female breast: Secondary | ICD-10-CM | POA: Diagnosis not present

## 2018-07-18 DIAGNOSIS — Z5111 Encounter for antineoplastic chemotherapy: Secondary | ICD-10-CM | POA: Diagnosis not present

## 2018-07-21 DIAGNOSIS — C50212 Malignant neoplasm of upper-inner quadrant of left female breast: Secondary | ICD-10-CM | POA: Diagnosis not present

## 2018-07-21 DIAGNOSIS — T451X5A Adverse effect of antineoplastic and immunosuppressive drugs, initial encounter: Secondary | ICD-10-CM | POA: Diagnosis not present

## 2018-07-21 DIAGNOSIS — C50412 Malignant neoplasm of upper-outer quadrant of left female breast: Secondary | ICD-10-CM | POA: Diagnosis not present

## 2018-07-21 DIAGNOSIS — Z09 Encounter for follow-up examination after completed treatment for conditions other than malignant neoplasm: Secondary | ICD-10-CM | POA: Diagnosis not present

## 2018-07-21 DIAGNOSIS — Z95828 Presence of other vascular implants and grafts: Secondary | ICD-10-CM | POA: Diagnosis not present

## 2018-07-21 DIAGNOSIS — Z171 Estrogen receptor negative status [ER-]: Secondary | ICD-10-CM | POA: Diagnosis not present

## 2018-07-26 DIAGNOSIS — R413 Other amnesia: Secondary | ICD-10-CM | POA: Diagnosis not present

## 2018-07-26 DIAGNOSIS — M797 Fibromyalgia: Secondary | ICD-10-CM | POA: Diagnosis not present

## 2018-07-26 DIAGNOSIS — F341 Dysthymic disorder: Secondary | ICD-10-CM | POA: Diagnosis not present

## 2018-07-26 DIAGNOSIS — Z09 Encounter for follow-up examination after completed treatment for conditions other than malignant neoplasm: Secondary | ICD-10-CM | POA: Diagnosis not present

## 2018-07-26 DIAGNOSIS — Z95828 Presence of other vascular implants and grafts: Secondary | ICD-10-CM | POA: Diagnosis not present

## 2018-07-26 DIAGNOSIS — Z171 Estrogen receptor negative status [ER-]: Secondary | ICD-10-CM | POA: Diagnosis not present

## 2018-07-26 DIAGNOSIS — T451X5D Adverse effect of antineoplastic and immunosuppressive drugs, subsequent encounter: Secondary | ICD-10-CM | POA: Diagnosis not present

## 2018-07-26 DIAGNOSIS — C50412 Malignant neoplasm of upper-outer quadrant of left female breast: Secondary | ICD-10-CM | POA: Diagnosis not present

## 2018-07-28 DIAGNOSIS — Z171 Estrogen receptor negative status [ER-]: Secondary | ICD-10-CM | POA: Diagnosis not present

## 2018-07-28 DIAGNOSIS — Z5111 Encounter for antineoplastic chemotherapy: Secondary | ICD-10-CM | POA: Diagnosis not present

## 2018-07-28 DIAGNOSIS — Z95828 Presence of other vascular implants and grafts: Secondary | ICD-10-CM | POA: Diagnosis not present

## 2018-07-28 DIAGNOSIS — C50212 Malignant neoplasm of upper-inner quadrant of left female breast: Secondary | ICD-10-CM | POA: Diagnosis not present

## 2018-08-01 DIAGNOSIS — C50912 Malignant neoplasm of unspecified site of left female breast: Secondary | ICD-10-CM | POA: Diagnosis not present

## 2018-08-01 DIAGNOSIS — C50412 Malignant neoplasm of upper-outer quadrant of left female breast: Secondary | ICD-10-CM | POA: Diagnosis not present

## 2018-08-01 DIAGNOSIS — Z299 Encounter for prophylactic measures, unspecified: Secondary | ICD-10-CM | POA: Diagnosis not present

## 2018-08-01 DIAGNOSIS — F329 Major depressive disorder, single episode, unspecified: Secondary | ICD-10-CM | POA: Diagnosis not present

## 2018-08-01 DIAGNOSIS — M797 Fibromyalgia: Secondary | ICD-10-CM | POA: Diagnosis not present

## 2018-08-01 DIAGNOSIS — I10 Essential (primary) hypertension: Secondary | ICD-10-CM | POA: Diagnosis not present

## 2018-08-01 DIAGNOSIS — R32 Unspecified urinary incontinence: Secondary | ICD-10-CM | POA: Diagnosis not present

## 2018-08-01 DIAGNOSIS — Z6828 Body mass index (BMI) 28.0-28.9, adult: Secondary | ICD-10-CM | POA: Diagnosis not present

## 2018-08-03 DIAGNOSIS — Z171 Estrogen receptor negative status [ER-]: Secondary | ICD-10-CM | POA: Diagnosis not present

## 2018-08-03 DIAGNOSIS — C50212 Malignant neoplasm of upper-inner quadrant of left female breast: Secondary | ICD-10-CM | POA: Diagnosis not present

## 2018-08-03 DIAGNOSIS — Z5111 Encounter for antineoplastic chemotherapy: Secondary | ICD-10-CM | POA: Diagnosis not present

## 2018-08-08 DIAGNOSIS — R413 Other amnesia: Secondary | ICD-10-CM | POA: Diagnosis not present

## 2018-08-08 DIAGNOSIS — T451X5A Adverse effect of antineoplastic and immunosuppressive drugs, initial encounter: Secondary | ICD-10-CM | POA: Diagnosis not present

## 2018-08-08 DIAGNOSIS — L658 Other specified nonscarring hair loss: Secondary | ICD-10-CM | POA: Diagnosis not present

## 2018-08-08 DIAGNOSIS — F341 Dysthymic disorder: Secondary | ICD-10-CM | POA: Diagnosis not present

## 2018-08-08 DIAGNOSIS — Z09 Encounter for follow-up examination after completed treatment for conditions other than malignant neoplasm: Secondary | ICD-10-CM | POA: Diagnosis not present

## 2018-08-08 DIAGNOSIS — C50412 Malignant neoplasm of upper-outer quadrant of left female breast: Secondary | ICD-10-CM | POA: Diagnosis not present

## 2018-08-10 DIAGNOSIS — Z171 Estrogen receptor negative status [ER-]: Secondary | ICD-10-CM | POA: Diagnosis not present

## 2018-08-10 DIAGNOSIS — Z5112 Encounter for antineoplastic immunotherapy: Secondary | ICD-10-CM | POA: Diagnosis not present

## 2018-08-10 DIAGNOSIS — Z5111 Encounter for antineoplastic chemotherapy: Secondary | ICD-10-CM | POA: Diagnosis not present

## 2018-08-10 DIAGNOSIS — C50212 Malignant neoplasm of upper-inner quadrant of left female breast: Secondary | ICD-10-CM | POA: Diagnosis not present

## 2018-08-15 DIAGNOSIS — C50412 Malignant neoplasm of upper-outer quadrant of left female breast: Secondary | ICD-10-CM | POA: Diagnosis not present

## 2018-08-15 DIAGNOSIS — T451X5A Adverse effect of antineoplastic and immunosuppressive drugs, initial encounter: Secondary | ICD-10-CM | POA: Diagnosis not present

## 2018-08-15 DIAGNOSIS — Z171 Estrogen receptor negative status [ER-]: Secondary | ICD-10-CM | POA: Diagnosis not present

## 2018-08-15 DIAGNOSIS — K1231 Oral mucositis (ulcerative) due to antineoplastic therapy: Secondary | ICD-10-CM | POA: Diagnosis not present

## 2018-08-15 DIAGNOSIS — C50212 Malignant neoplasm of upper-inner quadrant of left female breast: Secondary | ICD-10-CM | POA: Diagnosis not present

## 2018-08-15 DIAGNOSIS — Z95828 Presence of other vascular implants and grafts: Secondary | ICD-10-CM | POA: Diagnosis not present

## 2018-08-15 DIAGNOSIS — Z09 Encounter for follow-up examination after completed treatment for conditions other than malignant neoplasm: Secondary | ICD-10-CM | POA: Diagnosis not present

## 2018-08-15 DIAGNOSIS — R110 Nausea: Secondary | ICD-10-CM | POA: Diagnosis not present

## 2018-08-17 DIAGNOSIS — C801 Malignant (primary) neoplasm, unspecified: Secondary | ICD-10-CM | POA: Diagnosis not present

## 2018-08-17 DIAGNOSIS — R42 Dizziness and giddiness: Secondary | ICD-10-CM | POA: Diagnosis not present

## 2018-08-17 DIAGNOSIS — R079 Chest pain, unspecified: Secondary | ICD-10-CM | POA: Diagnosis not present

## 2018-08-17 DIAGNOSIS — G9389 Other specified disorders of brain: Secondary | ICD-10-CM | POA: Diagnosis not present

## 2018-08-17 DIAGNOSIS — Z79899 Other long term (current) drug therapy: Secondary | ICD-10-CM | POA: Diagnosis not present

## 2018-08-17 DIAGNOSIS — Z853 Personal history of malignant neoplasm of breast: Secondary | ICD-10-CM | POA: Diagnosis not present

## 2018-08-17 DIAGNOSIS — C50212 Malignant neoplasm of upper-inner quadrant of left female breast: Secondary | ICD-10-CM | POA: Diagnosis not present

## 2018-08-17 DIAGNOSIS — Z171 Estrogen receptor negative status [ER-]: Secondary | ICD-10-CM | POA: Diagnosis not present

## 2018-08-17 DIAGNOSIS — R0602 Shortness of breath: Secondary | ICD-10-CM | POA: Diagnosis not present

## 2018-08-22 DIAGNOSIS — Z171 Estrogen receptor negative status [ER-]: Secondary | ICD-10-CM | POA: Diagnosis not present

## 2018-08-22 DIAGNOSIS — M5136 Other intervertebral disc degeneration, lumbar region: Secondary | ICD-10-CM | POA: Diagnosis not present

## 2018-08-22 DIAGNOSIS — C50212 Malignant neoplasm of upper-inner quadrant of left female breast: Secondary | ICD-10-CM | POA: Diagnosis not present

## 2018-08-22 DIAGNOSIS — Z09 Encounter for follow-up examination after completed treatment for conditions other than malignant neoplasm: Secondary | ICD-10-CM | POA: Diagnosis not present

## 2018-08-22 DIAGNOSIS — C50412 Malignant neoplasm of upper-outer quadrant of left female breast: Secondary | ICD-10-CM | POA: Diagnosis not present

## 2018-08-24 DIAGNOSIS — Z171 Estrogen receptor negative status [ER-]: Secondary | ICD-10-CM | POA: Diagnosis not present

## 2018-08-24 DIAGNOSIS — C50212 Malignant neoplasm of upper-inner quadrant of left female breast: Secondary | ICD-10-CM | POA: Diagnosis not present

## 2018-08-24 DIAGNOSIS — Z5111 Encounter for antineoplastic chemotherapy: Secondary | ICD-10-CM | POA: Diagnosis not present

## 2018-08-24 DIAGNOSIS — Z95828 Presence of other vascular implants and grafts: Secondary | ICD-10-CM | POA: Diagnosis not present

## 2018-08-29 DIAGNOSIS — F329 Major depressive disorder, single episode, unspecified: Secondary | ICD-10-CM | POA: Diagnosis not present

## 2018-08-29 DIAGNOSIS — R110 Nausea: Secondary | ICD-10-CM | POA: Diagnosis not present

## 2018-08-29 DIAGNOSIS — I10 Essential (primary) hypertension: Secondary | ICD-10-CM | POA: Diagnosis not present

## 2018-08-29 DIAGNOSIS — R413 Other amnesia: Secondary | ICD-10-CM | POA: Diagnosis not present

## 2018-08-29 DIAGNOSIS — Z171 Estrogen receptor negative status [ER-]: Secondary | ICD-10-CM | POA: Diagnosis not present

## 2018-08-29 DIAGNOSIS — T451X5A Adverse effect of antineoplastic and immunosuppressive drugs, initial encounter: Secondary | ICD-10-CM | POA: Diagnosis not present

## 2018-08-29 DIAGNOSIS — M797 Fibromyalgia: Secondary | ICD-10-CM | POA: Diagnosis not present

## 2018-08-29 DIAGNOSIS — Z09 Encounter for follow-up examination after completed treatment for conditions other than malignant neoplasm: Secondary | ICD-10-CM | POA: Diagnosis not present

## 2018-08-29 DIAGNOSIS — B029 Zoster without complications: Secondary | ICD-10-CM | POA: Diagnosis not present

## 2018-08-29 DIAGNOSIS — C50412 Malignant neoplasm of upper-outer quadrant of left female breast: Secondary | ICD-10-CM | POA: Diagnosis not present

## 2018-08-31 DIAGNOSIS — Z171 Estrogen receptor negative status [ER-]: Secondary | ICD-10-CM | POA: Diagnosis not present

## 2018-08-31 DIAGNOSIS — Z5112 Encounter for antineoplastic immunotherapy: Secondary | ICD-10-CM | POA: Diagnosis not present

## 2018-08-31 DIAGNOSIS — Z5111 Encounter for antineoplastic chemotherapy: Secondary | ICD-10-CM | POA: Diagnosis not present

## 2018-08-31 DIAGNOSIS — C50212 Malignant neoplasm of upper-inner quadrant of left female breast: Secondary | ICD-10-CM | POA: Diagnosis not present

## 2018-09-05 DIAGNOSIS — Z09 Encounter for follow-up examination after completed treatment for conditions other than malignant neoplasm: Secondary | ICD-10-CM | POA: Diagnosis not present

## 2018-09-05 DIAGNOSIS — C50212 Malignant neoplasm of upper-inner quadrant of left female breast: Secondary | ICD-10-CM | POA: Diagnosis not present

## 2018-09-05 DIAGNOSIS — R5382 Chronic fatigue, unspecified: Secondary | ICD-10-CM | POA: Diagnosis not present

## 2018-09-05 DIAGNOSIS — Z171 Estrogen receptor negative status [ER-]: Secondary | ICD-10-CM | POA: Diagnosis not present

## 2018-09-05 DIAGNOSIS — C50412 Malignant neoplasm of upper-outer quadrant of left female breast: Secondary | ICD-10-CM | POA: Diagnosis not present

## 2018-09-07 DIAGNOSIS — Z5111 Encounter for antineoplastic chemotherapy: Secondary | ICD-10-CM | POA: Diagnosis not present

## 2018-09-07 DIAGNOSIS — Z171 Estrogen receptor negative status [ER-]: Secondary | ICD-10-CM | POA: Diagnosis not present

## 2018-09-07 DIAGNOSIS — C50212 Malignant neoplasm of upper-inner quadrant of left female breast: Secondary | ICD-10-CM | POA: Diagnosis not present

## 2018-09-12 DIAGNOSIS — C50212 Malignant neoplasm of upper-inner quadrant of left female breast: Secondary | ICD-10-CM | POA: Diagnosis not present

## 2018-09-12 DIAGNOSIS — C50412 Malignant neoplasm of upper-outer quadrant of left female breast: Secondary | ICD-10-CM | POA: Diagnosis not present

## 2018-09-12 DIAGNOSIS — Z09 Encounter for follow-up examination after completed treatment for conditions other than malignant neoplasm: Secondary | ICD-10-CM | POA: Diagnosis not present

## 2018-09-12 DIAGNOSIS — M797 Fibromyalgia: Secondary | ICD-10-CM | POA: Diagnosis not present

## 2018-09-12 DIAGNOSIS — Z171 Estrogen receptor negative status [ER-]: Secondary | ICD-10-CM | POA: Diagnosis not present

## 2018-09-14 DIAGNOSIS — C50212 Malignant neoplasm of upper-inner quadrant of left female breast: Secondary | ICD-10-CM | POA: Diagnosis not present

## 2018-09-14 DIAGNOSIS — Z171 Estrogen receptor negative status [ER-]: Secondary | ICD-10-CM | POA: Diagnosis not present

## 2018-09-14 DIAGNOSIS — Z5111 Encounter for antineoplastic chemotherapy: Secondary | ICD-10-CM | POA: Diagnosis not present

## 2018-09-19 DIAGNOSIS — T451X5D Adverse effect of antineoplastic and immunosuppressive drugs, subsequent encounter: Secondary | ICD-10-CM | POA: Diagnosis not present

## 2018-09-19 DIAGNOSIS — C50212 Malignant neoplasm of upper-inner quadrant of left female breast: Secondary | ICD-10-CM | POA: Diagnosis not present

## 2018-09-19 DIAGNOSIS — R110 Nausea: Secondary | ICD-10-CM | POA: Diagnosis not present

## 2018-09-19 DIAGNOSIS — C50412 Malignant neoplasm of upper-outer quadrant of left female breast: Secondary | ICD-10-CM | POA: Diagnosis not present

## 2018-09-19 DIAGNOSIS — Z171 Estrogen receptor negative status [ER-]: Secondary | ICD-10-CM | POA: Diagnosis not present

## 2018-09-19 DIAGNOSIS — Z09 Encounter for follow-up examination after completed treatment for conditions other than malignant neoplasm: Secondary | ICD-10-CM | POA: Diagnosis not present

## 2018-09-19 DIAGNOSIS — Z95828 Presence of other vascular implants and grafts: Secondary | ICD-10-CM | POA: Diagnosis not present

## 2018-09-19 DIAGNOSIS — T451X5A Adverse effect of antineoplastic and immunosuppressive drugs, initial encounter: Secondary | ICD-10-CM | POA: Diagnosis not present

## 2018-09-20 DIAGNOSIS — C50412 Malignant neoplasm of upper-outer quadrant of left female breast: Secondary | ICD-10-CM | POA: Diagnosis not present

## 2018-09-20 DIAGNOSIS — T451X5D Adverse effect of antineoplastic and immunosuppressive drugs, subsequent encounter: Secondary | ICD-10-CM | POA: Diagnosis not present

## 2018-09-20 DIAGNOSIS — Z95828 Presence of other vascular implants and grafts: Secondary | ICD-10-CM | POA: Diagnosis not present

## 2018-09-26 DIAGNOSIS — K1231 Oral mucositis (ulcerative) due to antineoplastic therapy: Secondary | ICD-10-CM | POA: Diagnosis not present

## 2018-09-26 DIAGNOSIS — Z09 Encounter for follow-up examination after completed treatment for conditions other than malignant neoplasm: Secondary | ICD-10-CM | POA: Diagnosis not present

## 2018-09-26 DIAGNOSIS — C50412 Malignant neoplasm of upper-outer quadrant of left female breast: Secondary | ICD-10-CM | POA: Diagnosis not present

## 2018-09-26 DIAGNOSIS — Z171 Estrogen receptor negative status [ER-]: Secondary | ICD-10-CM | POA: Diagnosis not present

## 2018-09-26 DIAGNOSIS — T451X5D Adverse effect of antineoplastic and immunosuppressive drugs, subsequent encounter: Secondary | ICD-10-CM | POA: Diagnosis not present

## 2018-09-26 DIAGNOSIS — R52 Pain, unspecified: Secondary | ICD-10-CM | POA: Diagnosis not present

## 2018-09-26 DIAGNOSIS — F329 Major depressive disorder, single episode, unspecified: Secondary | ICD-10-CM | POA: Diagnosis not present

## 2018-09-26 DIAGNOSIS — C50212 Malignant neoplasm of upper-inner quadrant of left female breast: Secondary | ICD-10-CM | POA: Diagnosis not present

## 2018-09-28 DIAGNOSIS — Z5111 Encounter for antineoplastic chemotherapy: Secondary | ICD-10-CM | POA: Diagnosis not present

## 2018-09-28 DIAGNOSIS — Z5112 Encounter for antineoplastic immunotherapy: Secondary | ICD-10-CM | POA: Diagnosis not present

## 2018-09-28 DIAGNOSIS — Z171 Estrogen receptor negative status [ER-]: Secondary | ICD-10-CM | POA: Diagnosis not present

## 2018-09-28 DIAGNOSIS — C50212 Malignant neoplasm of upper-inner quadrant of left female breast: Secondary | ICD-10-CM | POA: Diagnosis not present

## 2018-10-03 DIAGNOSIS — T451X5D Adverse effect of antineoplastic and immunosuppressive drugs, subsequent encounter: Secondary | ICD-10-CM | POA: Diagnosis not present

## 2018-10-03 DIAGNOSIS — C50412 Malignant neoplasm of upper-outer quadrant of left female breast: Secondary | ICD-10-CM | POA: Diagnosis not present

## 2018-10-03 DIAGNOSIS — M797 Fibromyalgia: Secondary | ICD-10-CM | POA: Diagnosis not present

## 2018-10-03 DIAGNOSIS — Z171 Estrogen receptor negative status [ER-]: Secondary | ICD-10-CM | POA: Diagnosis not present

## 2018-10-03 DIAGNOSIS — Z09 Encounter for follow-up examination after completed treatment for conditions other than malignant neoplasm: Secondary | ICD-10-CM | POA: Diagnosis not present

## 2018-10-03 DIAGNOSIS — C50212 Malignant neoplasm of upper-inner quadrant of left female breast: Secondary | ICD-10-CM | POA: Diagnosis not present

## 2018-10-05 DIAGNOSIS — C50212 Malignant neoplasm of upper-inner quadrant of left female breast: Secondary | ICD-10-CM | POA: Diagnosis not present

## 2018-10-05 DIAGNOSIS — Z171 Estrogen receptor negative status [ER-]: Secondary | ICD-10-CM | POA: Diagnosis not present

## 2018-10-05 DIAGNOSIS — Z5111 Encounter for antineoplastic chemotherapy: Secondary | ICD-10-CM | POA: Diagnosis not present

## 2018-10-10 DIAGNOSIS — Z171 Estrogen receptor negative status [ER-]: Secondary | ICD-10-CM | POA: Diagnosis not present

## 2018-10-10 DIAGNOSIS — Z09 Encounter for follow-up examination after completed treatment for conditions other than malignant neoplasm: Secondary | ICD-10-CM | POA: Diagnosis not present

## 2018-10-10 DIAGNOSIS — L601 Onycholysis: Secondary | ICD-10-CM | POA: Diagnosis not present

## 2018-10-10 DIAGNOSIS — C50212 Malignant neoplasm of upper-inner quadrant of left female breast: Secondary | ICD-10-CM | POA: Diagnosis not present

## 2018-10-10 DIAGNOSIS — M797 Fibromyalgia: Secondary | ICD-10-CM | POA: Diagnosis not present

## 2018-10-10 DIAGNOSIS — C50412 Malignant neoplasm of upper-outer quadrant of left female breast: Secondary | ICD-10-CM | POA: Diagnosis not present

## 2018-10-11 DIAGNOSIS — L600 Ingrowing nail: Secondary | ICD-10-CM | POA: Diagnosis not present

## 2018-10-11 DIAGNOSIS — M79674 Pain in right toe(s): Secondary | ICD-10-CM | POA: Diagnosis not present

## 2018-10-12 DIAGNOSIS — C50212 Malignant neoplasm of upper-inner quadrant of left female breast: Secondary | ICD-10-CM | POA: Diagnosis not present

## 2018-10-12 DIAGNOSIS — Z5111 Encounter for antineoplastic chemotherapy: Secondary | ICD-10-CM | POA: Diagnosis not present

## 2018-10-12 DIAGNOSIS — Z171 Estrogen receptor negative status [ER-]: Secondary | ICD-10-CM | POA: Diagnosis not present

## 2018-10-18 DIAGNOSIS — Z09 Encounter for follow-up examination after completed treatment for conditions other than malignant neoplasm: Secondary | ICD-10-CM | POA: Diagnosis not present

## 2018-10-18 DIAGNOSIS — Z171 Estrogen receptor negative status [ER-]: Secondary | ICD-10-CM | POA: Diagnosis not present

## 2018-10-18 DIAGNOSIS — R5382 Chronic fatigue, unspecified: Secondary | ICD-10-CM | POA: Diagnosis not present

## 2018-10-18 DIAGNOSIS — M797 Fibromyalgia: Secondary | ICD-10-CM | POA: Diagnosis not present

## 2018-10-18 DIAGNOSIS — R195 Other fecal abnormalities: Secondary | ICD-10-CM | POA: Diagnosis not present

## 2018-10-18 DIAGNOSIS — Z0189 Encounter for other specified special examinations: Secondary | ICD-10-CM | POA: Diagnosis not present

## 2018-10-18 DIAGNOSIS — L601 Onycholysis: Secondary | ICD-10-CM | POA: Diagnosis not present

## 2018-10-18 DIAGNOSIS — C50212 Malignant neoplasm of upper-inner quadrant of left female breast: Secondary | ICD-10-CM | POA: Diagnosis not present

## 2018-10-20 DIAGNOSIS — C50412 Malignant neoplasm of upper-outer quadrant of left female breast: Secondary | ICD-10-CM | POA: Diagnosis not present

## 2018-10-20 DIAGNOSIS — Z95828 Presence of other vascular implants and grafts: Secondary | ICD-10-CM | POA: Diagnosis not present

## 2018-10-20 DIAGNOSIS — T451X5D Adverse effect of antineoplastic and immunosuppressive drugs, subsequent encounter: Secondary | ICD-10-CM | POA: Diagnosis not present

## 2018-10-20 DIAGNOSIS — K1231 Oral mucositis (ulcerative) due to antineoplastic therapy: Secondary | ICD-10-CM | POA: Diagnosis not present

## 2018-10-24 DIAGNOSIS — M79672 Pain in left foot: Secondary | ICD-10-CM | POA: Diagnosis not present

## 2018-10-24 DIAGNOSIS — L03032 Cellulitis of left toe: Secondary | ICD-10-CM | POA: Diagnosis not present

## 2018-10-24 DIAGNOSIS — C50212 Malignant neoplasm of upper-inner quadrant of left female breast: Secondary | ICD-10-CM | POA: Diagnosis not present

## 2018-10-24 DIAGNOSIS — Z171 Estrogen receptor negative status [ER-]: Secondary | ICD-10-CM | POA: Diagnosis not present

## 2018-10-24 DIAGNOSIS — M79675 Pain in left toe(s): Secondary | ICD-10-CM | POA: Diagnosis not present

## 2018-10-24 DIAGNOSIS — L600 Ingrowing nail: Secondary | ICD-10-CM | POA: Diagnosis not present

## 2018-10-24 DIAGNOSIS — R195 Other fecal abnormalities: Secondary | ICD-10-CM | POA: Diagnosis not present

## 2018-10-31 DIAGNOSIS — Z171 Estrogen receptor negative status [ER-]: Secondary | ICD-10-CM | POA: Diagnosis not present

## 2018-10-31 DIAGNOSIS — R195 Other fecal abnormalities: Secondary | ICD-10-CM | POA: Diagnosis not present

## 2018-10-31 DIAGNOSIS — C50212 Malignant neoplasm of upper-inner quadrant of left female breast: Secondary | ICD-10-CM | POA: Diagnosis not present

## 2018-11-02 DIAGNOSIS — Z95828 Presence of other vascular implants and grafts: Secondary | ICD-10-CM | POA: Diagnosis not present

## 2018-11-02 DIAGNOSIS — M797 Fibromyalgia: Secondary | ICD-10-CM | POA: Diagnosis not present

## 2018-11-02 DIAGNOSIS — L658 Other specified nonscarring hair loss: Secondary | ICD-10-CM | POA: Diagnosis not present

## 2018-11-02 DIAGNOSIS — C50212 Malignant neoplasm of upper-inner quadrant of left female breast: Secondary | ICD-10-CM | POA: Diagnosis not present

## 2018-11-02 DIAGNOSIS — Z171 Estrogen receptor negative status [ER-]: Secondary | ICD-10-CM | POA: Diagnosis not present

## 2018-11-02 DIAGNOSIS — C50812 Malignant neoplasm of overlapping sites of left female breast: Secondary | ICD-10-CM | POA: Diagnosis not present

## 2018-11-02 DIAGNOSIS — T451X5A Adverse effect of antineoplastic and immunosuppressive drugs, initial encounter: Secondary | ICD-10-CM | POA: Diagnosis not present

## 2018-11-02 DIAGNOSIS — Z09 Encounter for follow-up examination after completed treatment for conditions other than malignant neoplasm: Secondary | ICD-10-CM | POA: Diagnosis not present

## 2018-11-02 DIAGNOSIS — Z5111 Encounter for antineoplastic chemotherapy: Secondary | ICD-10-CM | POA: Diagnosis not present

## 2018-11-09 DIAGNOSIS — M79675 Pain in left toe(s): Secondary | ICD-10-CM | POA: Diagnosis not present

## 2018-11-09 DIAGNOSIS — L03032 Cellulitis of left toe: Secondary | ICD-10-CM | POA: Diagnosis not present

## 2018-11-09 DIAGNOSIS — M79672 Pain in left foot: Secondary | ICD-10-CM | POA: Diagnosis not present

## 2018-11-09 DIAGNOSIS — L600 Ingrowing nail: Secondary | ICD-10-CM | POA: Diagnosis not present

## 2018-11-15 DIAGNOSIS — I351 Nonrheumatic aortic (valve) insufficiency: Secondary | ICD-10-CM | POA: Diagnosis not present

## 2018-11-15 DIAGNOSIS — Z0189 Encounter for other specified special examinations: Secondary | ICD-10-CM | POA: Diagnosis not present

## 2018-11-15 DIAGNOSIS — C50212 Malignant neoplasm of upper-inner quadrant of left female breast: Secondary | ICD-10-CM | POA: Diagnosis not present

## 2018-11-15 DIAGNOSIS — Z171 Estrogen receptor negative status [ER-]: Secondary | ICD-10-CM | POA: Diagnosis not present

## 2018-11-21 DIAGNOSIS — Z5181 Encounter for therapeutic drug level monitoring: Secondary | ICD-10-CM | POA: Diagnosis not present

## 2018-11-21 DIAGNOSIS — Z79899 Other long term (current) drug therapy: Secondary | ICD-10-CM | POA: Diagnosis not present

## 2018-11-21 DIAGNOSIS — Z09 Encounter for follow-up examination after completed treatment for conditions other than malignant neoplasm: Secondary | ICD-10-CM | POA: Diagnosis not present

## 2018-11-21 DIAGNOSIS — Z171 Estrogen receptor negative status [ER-]: Secondary | ICD-10-CM | POA: Diagnosis not present

## 2018-11-21 DIAGNOSIS — C50212 Malignant neoplasm of upper-inner quadrant of left female breast: Secondary | ICD-10-CM | POA: Diagnosis not present

## 2018-11-23 DIAGNOSIS — Z5112 Encounter for antineoplastic immunotherapy: Secondary | ICD-10-CM | POA: Diagnosis not present

## 2018-11-23 DIAGNOSIS — C50412 Malignant neoplasm of upper-outer quadrant of left female breast: Secondary | ICD-10-CM | POA: Diagnosis not present

## 2018-11-23 DIAGNOSIS — Z171 Estrogen receptor negative status [ER-]: Secondary | ICD-10-CM | POA: Diagnosis not present

## 2018-11-23 DIAGNOSIS — C50212 Malignant neoplasm of upper-inner quadrant of left female breast: Secondary | ICD-10-CM | POA: Diagnosis not present

## 2018-12-05 DIAGNOSIS — R197 Diarrhea, unspecified: Secondary | ICD-10-CM | POA: Diagnosis not present

## 2018-12-05 DIAGNOSIS — Z299 Encounter for prophylactic measures, unspecified: Secondary | ICD-10-CM | POA: Diagnosis not present

## 2018-12-05 DIAGNOSIS — I10 Essential (primary) hypertension: Secondary | ICD-10-CM | POA: Diagnosis not present

## 2018-12-06 DIAGNOSIS — Z1159 Encounter for screening for other viral diseases: Secondary | ICD-10-CM | POA: Diagnosis not present

## 2018-12-21 DIAGNOSIS — R197 Diarrhea, unspecified: Secondary | ICD-10-CM | POA: Diagnosis not present

## 2018-12-21 DIAGNOSIS — Z5181 Encounter for therapeutic drug level monitoring: Secondary | ICD-10-CM | POA: Diagnosis not present

## 2018-12-21 DIAGNOSIS — C50212 Malignant neoplasm of upper-inner quadrant of left female breast: Secondary | ICD-10-CM | POA: Diagnosis not present

## 2018-12-21 DIAGNOSIS — T451X5A Adverse effect of antineoplastic and immunosuppressive drugs, initial encounter: Secondary | ICD-10-CM | POA: Diagnosis not present

## 2018-12-21 DIAGNOSIS — Z79899 Other long term (current) drug therapy: Secondary | ICD-10-CM | POA: Diagnosis not present

## 2018-12-21 DIAGNOSIS — Z171 Estrogen receptor negative status [ER-]: Secondary | ICD-10-CM | POA: Diagnosis not present

## 2018-12-21 DIAGNOSIS — F329 Major depressive disorder, single episode, unspecified: Secondary | ICD-10-CM | POA: Diagnosis not present

## 2018-12-21 DIAGNOSIS — C50412 Malignant neoplasm of upper-outer quadrant of left female breast: Secondary | ICD-10-CM | POA: Diagnosis not present

## 2018-12-21 DIAGNOSIS — Z09 Encounter for follow-up examination after completed treatment for conditions other than malignant neoplasm: Secondary | ICD-10-CM | POA: Diagnosis not present

## 2018-12-22 DIAGNOSIS — C50212 Malignant neoplasm of upper-inner quadrant of left female breast: Secondary | ICD-10-CM | POA: Diagnosis not present

## 2018-12-22 DIAGNOSIS — C50412 Malignant neoplasm of upper-outer quadrant of left female breast: Secondary | ICD-10-CM | POA: Diagnosis not present

## 2018-12-22 DIAGNOSIS — Z95828 Presence of other vascular implants and grafts: Secondary | ICD-10-CM | POA: Diagnosis not present

## 2018-12-22 DIAGNOSIS — Z171 Estrogen receptor negative status [ER-]: Secondary | ICD-10-CM | POA: Diagnosis not present

## 2018-12-22 DIAGNOSIS — Z5111 Encounter for antineoplastic chemotherapy: Secondary | ICD-10-CM | POA: Diagnosis not present

## 2019-01-03 DIAGNOSIS — M79671 Pain in right foot: Secondary | ICD-10-CM | POA: Diagnosis not present

## 2019-01-03 DIAGNOSIS — M25579 Pain in unspecified ankle and joints of unspecified foot: Secondary | ICD-10-CM | POA: Diagnosis not present

## 2019-01-03 DIAGNOSIS — M79672 Pain in left foot: Secondary | ICD-10-CM | POA: Diagnosis not present

## 2019-01-11 DIAGNOSIS — C50212 Malignant neoplasm of upper-inner quadrant of left female breast: Secondary | ICD-10-CM | POA: Diagnosis not present

## 2019-01-11 DIAGNOSIS — Z171 Estrogen receptor negative status [ER-]: Secondary | ICD-10-CM | POA: Diagnosis not present

## 2019-01-11 DIAGNOSIS — Z09 Encounter for follow-up examination after completed treatment for conditions other than malignant neoplasm: Secondary | ICD-10-CM | POA: Diagnosis not present

## 2019-01-11 DIAGNOSIS — I10 Essential (primary) hypertension: Secondary | ICD-10-CM | POA: Diagnosis not present

## 2019-01-12 DIAGNOSIS — C50212 Malignant neoplasm of upper-inner quadrant of left female breast: Secondary | ICD-10-CM | POA: Diagnosis not present

## 2019-01-12 DIAGNOSIS — Z171 Estrogen receptor negative status [ER-]: Secondary | ICD-10-CM | POA: Diagnosis not present

## 2019-01-12 DIAGNOSIS — C50412 Malignant neoplasm of upper-outer quadrant of left female breast: Secondary | ICD-10-CM | POA: Diagnosis not present

## 2019-01-12 DIAGNOSIS — Z5112 Encounter for antineoplastic immunotherapy: Secondary | ICD-10-CM | POA: Diagnosis not present

## 2019-01-16 DIAGNOSIS — I361 Nonrheumatic tricuspid (valve) insufficiency: Secondary | ICD-10-CM | POA: Diagnosis not present

## 2019-01-16 DIAGNOSIS — Z171 Estrogen receptor negative status [ER-]: Secondary | ICD-10-CM | POA: Diagnosis not present

## 2019-01-16 DIAGNOSIS — I10 Essential (primary) hypertension: Secondary | ICD-10-CM | POA: Diagnosis not present

## 2019-01-16 DIAGNOSIS — C50212 Malignant neoplasm of upper-inner quadrant of left female breast: Secondary | ICD-10-CM | POA: Diagnosis not present

## 2019-01-16 DIAGNOSIS — I700 Atherosclerosis of aorta: Secondary | ICD-10-CM | POA: Diagnosis not present

## 2019-01-19 DIAGNOSIS — I10 Essential (primary) hypertension: Secondary | ICD-10-CM | POA: Diagnosis not present

## 2019-01-19 DIAGNOSIS — C50212 Malignant neoplasm of upper-inner quadrant of left female breast: Secondary | ICD-10-CM | POA: Diagnosis not present

## 2019-01-19 DIAGNOSIS — Z90710 Acquired absence of both cervix and uterus: Secondary | ICD-10-CM | POA: Diagnosis not present

## 2019-01-19 DIAGNOSIS — Z9889 Other specified postprocedural states: Secondary | ICD-10-CM | POA: Diagnosis not present

## 2019-01-19 DIAGNOSIS — M797 Fibromyalgia: Secondary | ICD-10-CM | POA: Diagnosis not present

## 2019-01-19 DIAGNOSIS — Z9079 Acquired absence of other genital organ(s): Secondary | ICD-10-CM | POA: Diagnosis not present

## 2019-01-19 DIAGNOSIS — Z96653 Presence of artificial knee joint, bilateral: Secondary | ICD-10-CM | POA: Diagnosis not present

## 2019-01-19 DIAGNOSIS — Z86011 Personal history of benign neoplasm of the brain: Secondary | ICD-10-CM | POA: Diagnosis not present

## 2019-01-19 DIAGNOSIS — Z90722 Acquired absence of ovaries, bilateral: Secondary | ICD-10-CM | POA: Diagnosis not present

## 2019-01-31 DIAGNOSIS — F331 Major depressive disorder, recurrent, moderate: Secondary | ICD-10-CM | POA: Diagnosis not present

## 2019-01-31 DIAGNOSIS — C50212 Malignant neoplasm of upper-inner quadrant of left female breast: Secondary | ICD-10-CM | POA: Diagnosis not present

## 2019-01-31 DIAGNOSIS — Z171 Estrogen receptor negative status [ER-]: Secondary | ICD-10-CM | POA: Diagnosis not present

## 2019-01-31 DIAGNOSIS — Z09 Encounter for follow-up examination after completed treatment for conditions other than malignant neoplasm: Secondary | ICD-10-CM | POA: Diagnosis not present

## 2019-01-31 DIAGNOSIS — L601 Onycholysis: Secondary | ICD-10-CM | POA: Diagnosis not present

## 2019-02-02 DIAGNOSIS — Z5111 Encounter for antineoplastic chemotherapy: Secondary | ICD-10-CM | POA: Diagnosis not present

## 2019-02-02 DIAGNOSIS — Z95828 Presence of other vascular implants and grafts: Secondary | ICD-10-CM | POA: Diagnosis not present

## 2019-02-02 DIAGNOSIS — C50212 Malignant neoplasm of upper-inner quadrant of left female breast: Secondary | ICD-10-CM | POA: Diagnosis not present

## 2019-02-02 DIAGNOSIS — Z171 Estrogen receptor negative status [ER-]: Secondary | ICD-10-CM | POA: Diagnosis not present

## 2019-02-20 DIAGNOSIS — H3540 Unspecified peripheral retinal degeneration: Secondary | ICD-10-CM | POA: Diagnosis not present

## 2019-02-21 DIAGNOSIS — Z23 Encounter for immunization: Secondary | ICD-10-CM | POA: Diagnosis not present

## 2019-02-21 DIAGNOSIS — Z171 Estrogen receptor negative status [ER-]: Secondary | ICD-10-CM | POA: Diagnosis not present

## 2019-02-21 DIAGNOSIS — Z79899 Other long term (current) drug therapy: Secondary | ICD-10-CM | POA: Diagnosis not present

## 2019-02-21 DIAGNOSIS — Z09 Encounter for follow-up examination after completed treatment for conditions other than malignant neoplasm: Secondary | ICD-10-CM | POA: Diagnosis not present

## 2019-02-21 DIAGNOSIS — Z5181 Encounter for therapeutic drug level monitoring: Secondary | ICD-10-CM | POA: Diagnosis not present

## 2019-02-21 DIAGNOSIS — C50212 Malignant neoplasm of upper-inner quadrant of left female breast: Secondary | ICD-10-CM | POA: Diagnosis not present

## 2019-02-23 DIAGNOSIS — Z5111 Encounter for antineoplastic chemotherapy: Secondary | ICD-10-CM | POA: Diagnosis not present

## 2019-02-23 DIAGNOSIS — C50412 Malignant neoplasm of upper-outer quadrant of left female breast: Secondary | ICD-10-CM | POA: Diagnosis not present

## 2019-03-07 DIAGNOSIS — C50912 Malignant neoplasm of unspecified site of left female breast: Secondary | ICD-10-CM | POA: Diagnosis not present

## 2019-03-07 DIAGNOSIS — Z1211 Encounter for screening for malignant neoplasm of colon: Secondary | ICD-10-CM | POA: Diagnosis not present

## 2019-03-07 DIAGNOSIS — Z6827 Body mass index (BMI) 27.0-27.9, adult: Secondary | ICD-10-CM | POA: Diagnosis not present

## 2019-03-07 DIAGNOSIS — E7800 Pure hypercholesterolemia, unspecified: Secondary | ICD-10-CM | POA: Diagnosis not present

## 2019-03-07 DIAGNOSIS — Z0000 Encounter for general adult medical examination without abnormal findings: Secondary | ICD-10-CM | POA: Diagnosis not present

## 2019-03-07 DIAGNOSIS — R5383 Other fatigue: Secondary | ICD-10-CM | POA: Diagnosis not present

## 2019-03-07 DIAGNOSIS — Z1331 Encounter for screening for depression: Secondary | ICD-10-CM | POA: Diagnosis not present

## 2019-03-07 DIAGNOSIS — Z299 Encounter for prophylactic measures, unspecified: Secondary | ICD-10-CM | POA: Diagnosis not present

## 2019-03-07 DIAGNOSIS — I10 Essential (primary) hypertension: Secondary | ICD-10-CM | POA: Diagnosis not present

## 2019-03-07 DIAGNOSIS — Z7189 Other specified counseling: Secondary | ICD-10-CM | POA: Diagnosis not present

## 2019-03-07 DIAGNOSIS — Z1339 Encounter for screening examination for other mental health and behavioral disorders: Secondary | ICD-10-CM | POA: Diagnosis not present

## 2019-03-13 DIAGNOSIS — R5383 Other fatigue: Secondary | ICD-10-CM | POA: Diagnosis not present

## 2019-03-13 DIAGNOSIS — E7800 Pure hypercholesterolemia, unspecified: Secondary | ICD-10-CM | POA: Diagnosis not present

## 2019-03-13 DIAGNOSIS — Z79899 Other long term (current) drug therapy: Secondary | ICD-10-CM | POA: Diagnosis not present

## 2019-03-14 DIAGNOSIS — T451X5A Adverse effect of antineoplastic and immunosuppressive drugs, initial encounter: Secondary | ICD-10-CM | POA: Diagnosis not present

## 2019-03-14 DIAGNOSIS — F329 Major depressive disorder, single episode, unspecified: Secondary | ICD-10-CM | POA: Diagnosis not present

## 2019-03-14 DIAGNOSIS — I10 Essential (primary) hypertension: Secondary | ICD-10-CM | POA: Diagnosis not present

## 2019-03-14 DIAGNOSIS — L601 Onycholysis: Secondary | ICD-10-CM | POA: Diagnosis not present

## 2019-03-14 DIAGNOSIS — C50212 Malignant neoplasm of upper-inner quadrant of left female breast: Secondary | ICD-10-CM | POA: Diagnosis not present

## 2019-03-14 DIAGNOSIS — C50412 Malignant neoplasm of upper-outer quadrant of left female breast: Secondary | ICD-10-CM | POA: Diagnosis not present

## 2019-03-14 DIAGNOSIS — Z171 Estrogen receptor negative status [ER-]: Secondary | ICD-10-CM | POA: Diagnosis not present

## 2019-03-16 DIAGNOSIS — C50212 Malignant neoplasm of upper-inner quadrant of left female breast: Secondary | ICD-10-CM | POA: Diagnosis not present

## 2019-03-16 DIAGNOSIS — C50412 Malignant neoplasm of upper-outer quadrant of left female breast: Secondary | ICD-10-CM | POA: Diagnosis not present

## 2019-03-16 DIAGNOSIS — Z171 Estrogen receptor negative status [ER-]: Secondary | ICD-10-CM | POA: Diagnosis not present

## 2019-03-16 DIAGNOSIS — Z5112 Encounter for antineoplastic immunotherapy: Secondary | ICD-10-CM | POA: Diagnosis not present

## 2019-03-22 DIAGNOSIS — C50412 Malignant neoplasm of upper-outer quadrant of left female breast: Secondary | ICD-10-CM | POA: Diagnosis not present

## 2019-03-22 DIAGNOSIS — I10 Essential (primary) hypertension: Secondary | ICD-10-CM | POA: Diagnosis not present

## 2019-03-25 IMAGING — MG NEEDLE LOCALIZATION OF THE LEFT BREAST WITH MAMMO GUIDANCE
7 series · 7 of 7 positions shown · non-contrast
Comparison: Previous exam(s).

CLINICAL DATA: 72-year-old female presenting for radioactive seed
localization of the left breast prior to lumpectomy.

EXAM:
MAMMOGRAPHIC GUIDED RADIOACTIVE SEED LOCALIZATION OF THE LEFT BREAST

[L CC (1 of 3)]
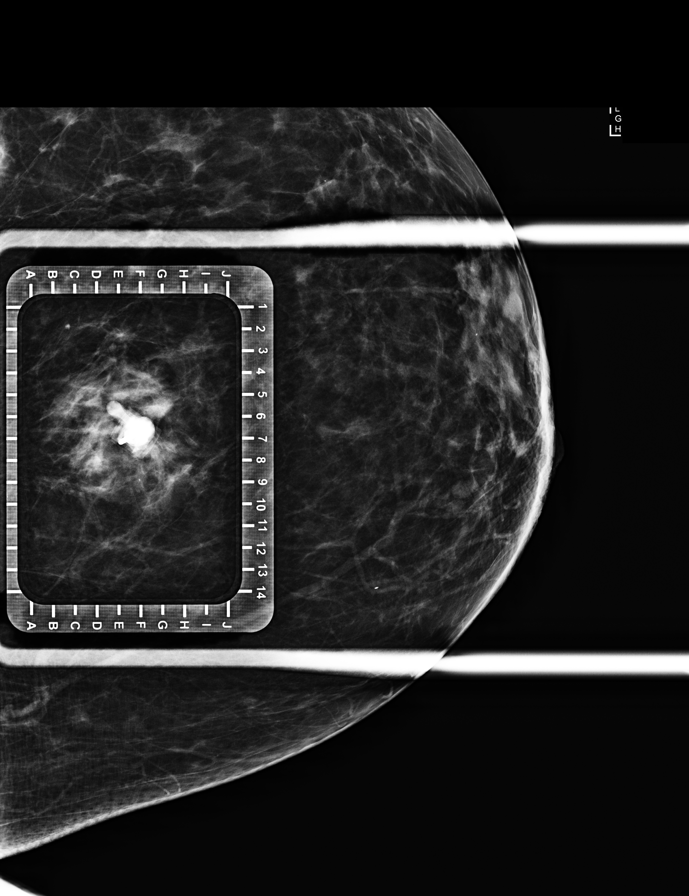

[L ML (1 of 4)]
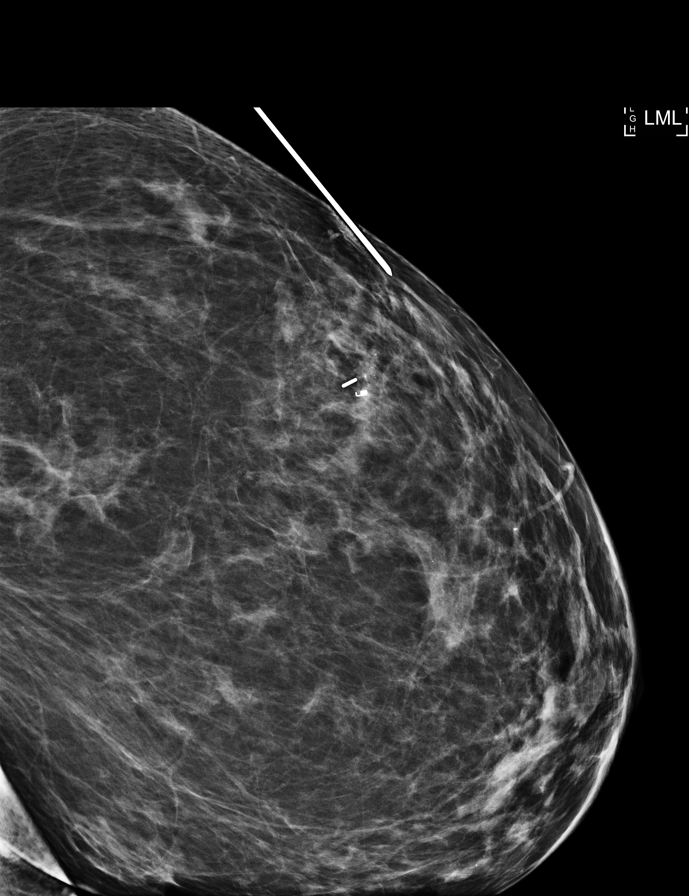

[L CC (2 of 3)]
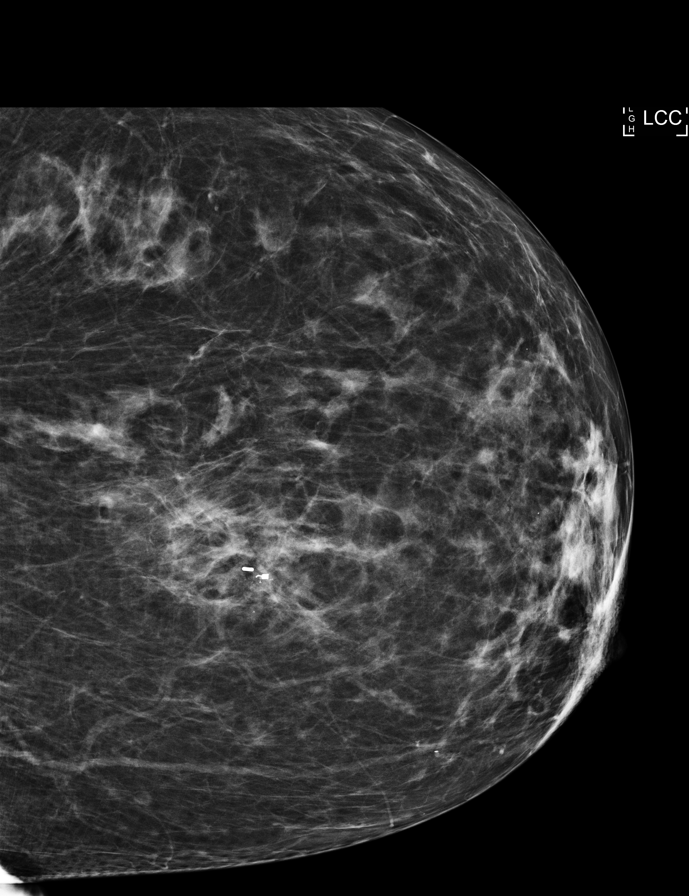

[L ML (2 of 4)]
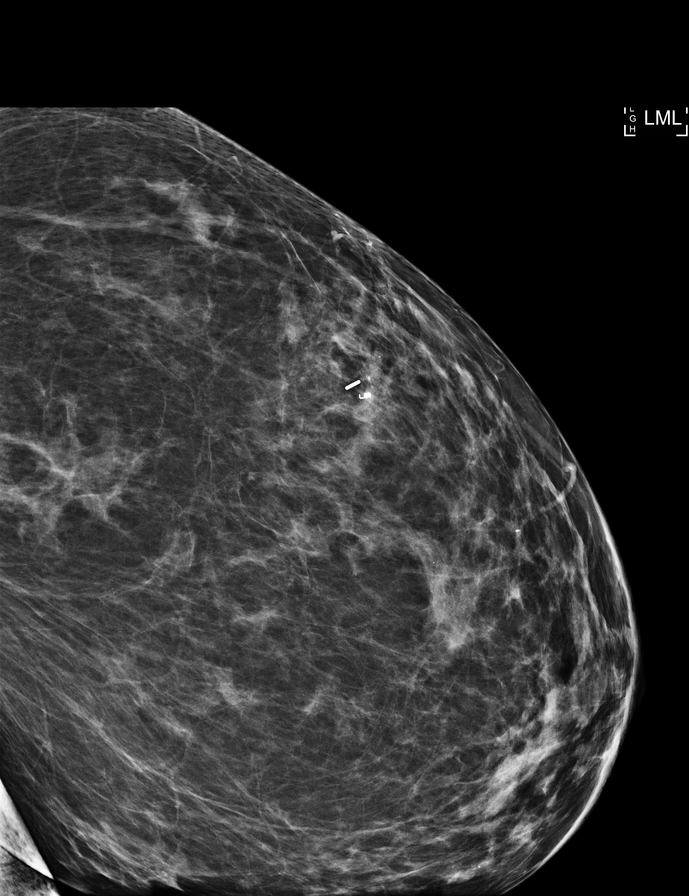

[L CC (3 of 3)]
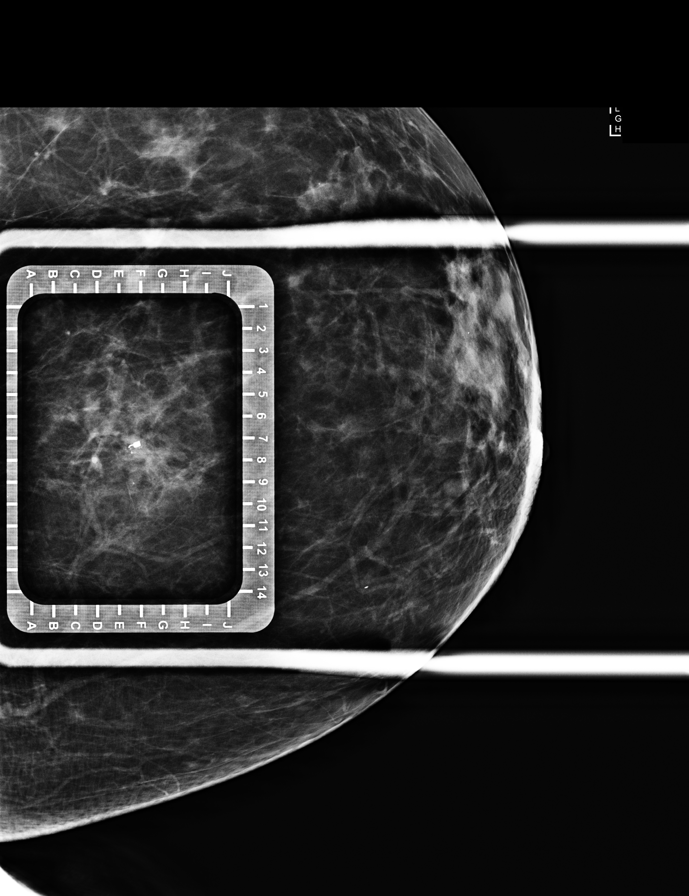

[L ML (3 of 4)]
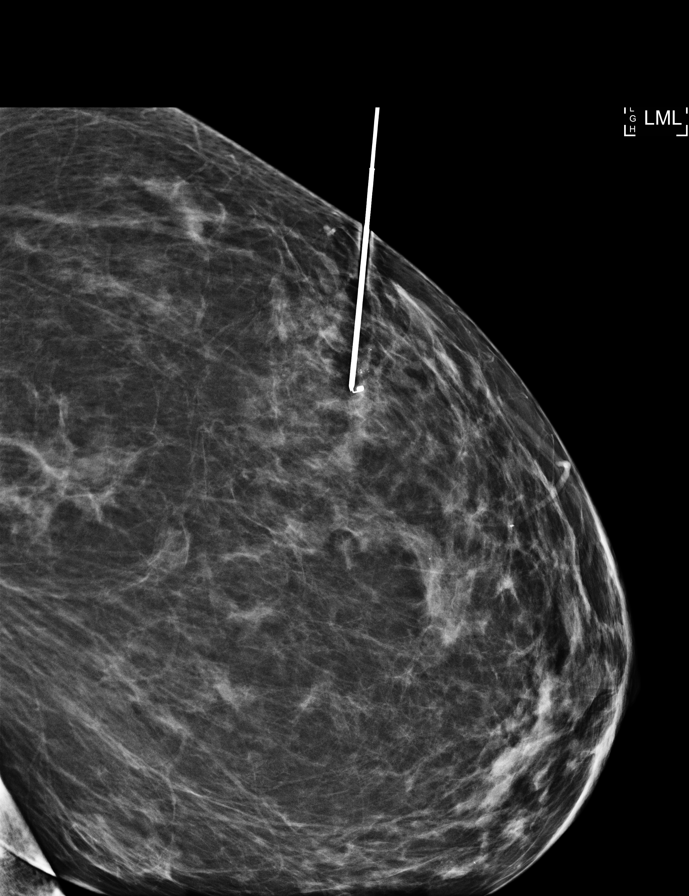

[L ML (4 of 4)]
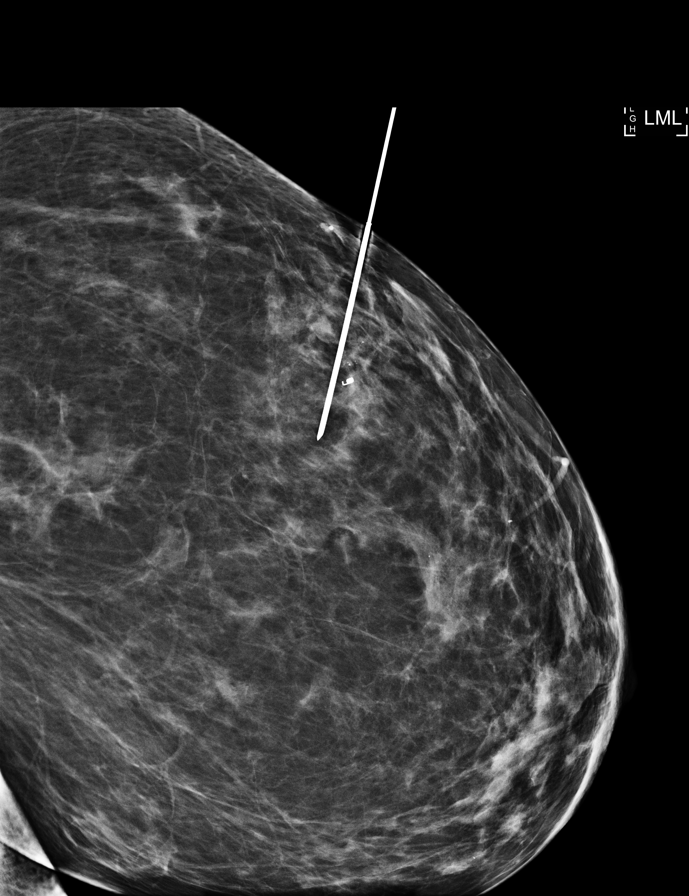

[7 of 7 positions shown; findings below may reference images not displayed]

FINDINGS: Patient presents for radioactive seed localization prior to left
breast lumpectomy. I met with the patient and we discussed the
procedure of seed localization including benefits and alternatives.
We discussed the high likelihood of a successful procedure. We
discussed the risks of the procedure including infection, bleeding,
tissue injury and further surgery. We discussed the low dose of
radioactivity involved in the procedure. Informed, written consent
was given.

The usual time-out protocol was performed immediately prior to the
procedure.

Using mammographic guidance, sterile technique, 1% lidocaine and an
2-ZSD radioactive seed, the coil shaped biopsy marking clip in the
upper inner left breast was localized using a superior approach. The
follow-up mammogram images confirm the seed in the expected location
and were marked for Dr. Villojan.

Follow-up survey of the patient confirms presence of the radioactive
seed.

Order number of 2-ZSD seed:  [id number].

Total activity:  0.25 for reference Date: 05/08/2018

The patient tolerated the procedure well and was released from the
[REDACTED]. She was given instructions regarding seed removal.
IMPRESSION: Radioactive seed localization left breast. No apparent
complications.

## 2019-03-28 IMAGING — MG BREAST SURGICAL SPECIMEN
1 series · 1 of 1 positions shown · non-contrast
Comparison: Previous exam(s).

CLINICAL DATA: Radioactive seed localization was performed June 09, 2018 prior to lumpectomy for invasive ductal carcinoma and DCIS.

EXAM:
SPECIMEN RADIOGRAPH OF THE LEFT BREAST

[L]
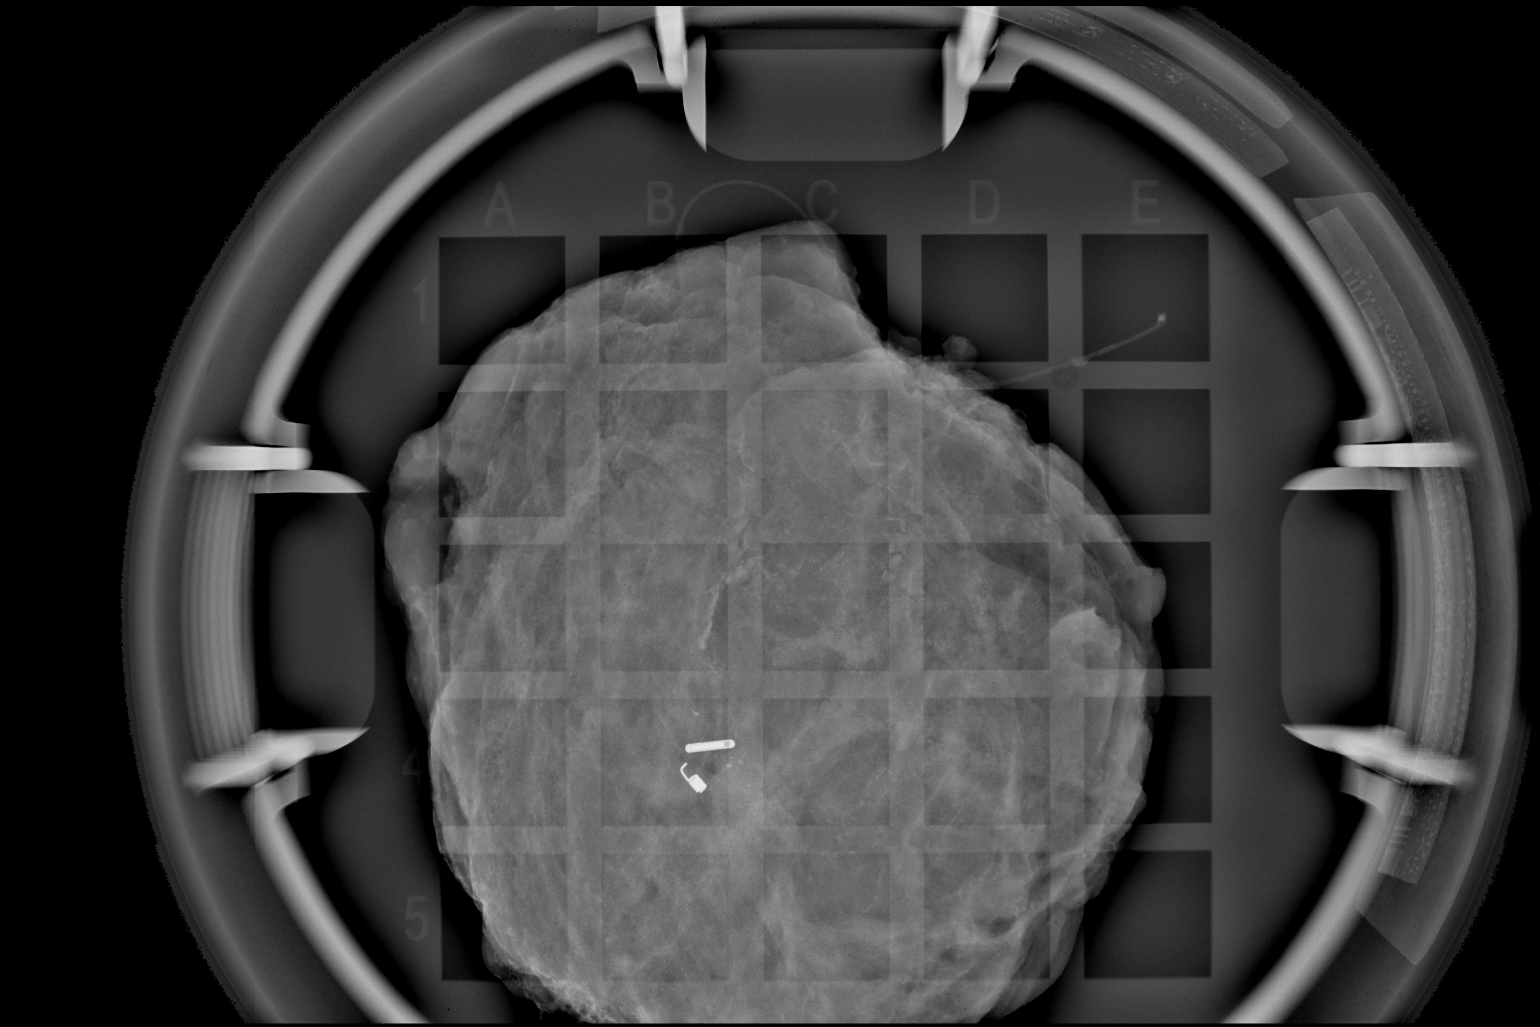

[1 of 1 positions shown; findings below may reference images not displayed]

FINDINGS: Status post excision of the left breast. The radioactive seed and
biopsy marker clip are present, completely intact, and were marked
for pathology.
IMPRESSION: Specimen radiograph of the left breast.

## 2019-04-04 DIAGNOSIS — C50412 Malignant neoplasm of upper-outer quadrant of left female breast: Secondary | ICD-10-CM | POA: Diagnosis not present

## 2019-04-04 DIAGNOSIS — T451X5A Adverse effect of antineoplastic and immunosuppressive drugs, initial encounter: Secondary | ICD-10-CM | POA: Diagnosis not present

## 2019-04-04 DIAGNOSIS — Z171 Estrogen receptor negative status [ER-]: Secondary | ICD-10-CM | POA: Diagnosis not present

## 2019-04-04 DIAGNOSIS — K1379 Other lesions of oral mucosa: Secondary | ICD-10-CM | POA: Diagnosis not present

## 2019-04-04 DIAGNOSIS — F329 Major depressive disorder, single episode, unspecified: Secondary | ICD-10-CM | POA: Diagnosis not present

## 2019-04-04 DIAGNOSIS — R5382 Chronic fatigue, unspecified: Secondary | ICD-10-CM | POA: Diagnosis not present

## 2019-04-04 DIAGNOSIS — E559 Vitamin D deficiency, unspecified: Secondary | ICD-10-CM | POA: Diagnosis not present

## 2019-04-04 DIAGNOSIS — C50212 Malignant neoplasm of upper-inner quadrant of left female breast: Secondary | ICD-10-CM | POA: Diagnosis not present

## 2019-04-04 DIAGNOSIS — M797 Fibromyalgia: Secondary | ICD-10-CM | POA: Diagnosis not present

## 2019-04-06 DIAGNOSIS — C50412 Malignant neoplasm of upper-outer quadrant of left female breast: Secondary | ICD-10-CM | POA: Diagnosis not present

## 2019-04-06 DIAGNOSIS — Z171 Estrogen receptor negative status [ER-]: Secondary | ICD-10-CM | POA: Diagnosis not present

## 2019-04-06 DIAGNOSIS — C50212 Malignant neoplasm of upper-inner quadrant of left female breast: Secondary | ICD-10-CM | POA: Diagnosis not present

## 2019-04-06 DIAGNOSIS — Z5112 Encounter for antineoplastic immunotherapy: Secondary | ICD-10-CM | POA: Diagnosis not present

## 2019-04-13 DIAGNOSIS — E559 Vitamin D deficiency, unspecified: Secondary | ICD-10-CM | POA: Diagnosis not present

## 2019-04-13 DIAGNOSIS — M797 Fibromyalgia: Secondary | ICD-10-CM | POA: Diagnosis not present

## 2019-04-16 ENCOUNTER — Other Ambulatory Visit: Payer: Self-pay | Admitting: General Surgery

## 2019-04-16 DIAGNOSIS — Z853 Personal history of malignant neoplasm of breast: Secondary | ICD-10-CM

## 2019-04-25 DIAGNOSIS — C50212 Malignant neoplasm of upper-inner quadrant of left female breast: Secondary | ICD-10-CM | POA: Diagnosis not present

## 2019-04-25 DIAGNOSIS — L601 Onycholysis: Secondary | ICD-10-CM | POA: Diagnosis not present

## 2019-04-25 DIAGNOSIS — F329 Major depressive disorder, single episode, unspecified: Secondary | ICD-10-CM | POA: Diagnosis not present

## 2019-04-25 DIAGNOSIS — Z171 Estrogen receptor negative status [ER-]: Secondary | ICD-10-CM | POA: Diagnosis not present

## 2019-04-25 DIAGNOSIS — R922 Inconclusive mammogram: Secondary | ICD-10-CM | POA: Diagnosis not present

## 2019-04-25 DIAGNOSIS — T451X5A Adverse effect of antineoplastic and immunosuppressive drugs, initial encounter: Secondary | ICD-10-CM | POA: Diagnosis not present

## 2019-04-25 DIAGNOSIS — Z853 Personal history of malignant neoplasm of breast: Secondary | ICD-10-CM | POA: Diagnosis not present

## 2019-04-27 DIAGNOSIS — C50212 Malignant neoplasm of upper-inner quadrant of left female breast: Secondary | ICD-10-CM | POA: Diagnosis not present

## 2019-04-27 DIAGNOSIS — Z5111 Encounter for antineoplastic chemotherapy: Secondary | ICD-10-CM | POA: Diagnosis not present

## 2019-04-27 DIAGNOSIS — C50412 Malignant neoplasm of upper-outer quadrant of left female breast: Secondary | ICD-10-CM | POA: Diagnosis not present

## 2019-04-27 DIAGNOSIS — Z171 Estrogen receptor negative status [ER-]: Secondary | ICD-10-CM | POA: Diagnosis not present

## 2019-05-16 DIAGNOSIS — C50919 Malignant neoplasm of unspecified site of unspecified female breast: Secondary | ICD-10-CM | POA: Diagnosis not present

## 2019-05-16 DIAGNOSIS — Z171 Estrogen receptor negative status [ER-]: Secondary | ICD-10-CM | POA: Diagnosis not present

## 2019-05-16 DIAGNOSIS — C50212 Malignant neoplasm of upper-inner quadrant of left female breast: Secondary | ICD-10-CM | POA: Diagnosis not present

## 2019-05-18 DIAGNOSIS — Z5111 Encounter for antineoplastic chemotherapy: Secondary | ICD-10-CM | POA: Diagnosis not present

## 2019-05-18 DIAGNOSIS — C50412 Malignant neoplasm of upper-outer quadrant of left female breast: Secondary | ICD-10-CM | POA: Diagnosis not present

## 2019-05-18 DIAGNOSIS — Z171 Estrogen receptor negative status [ER-]: Secondary | ICD-10-CM | POA: Diagnosis not present

## 2019-05-18 DIAGNOSIS — C50212 Malignant neoplasm of upper-inner quadrant of left female breast: Secondary | ICD-10-CM | POA: Diagnosis not present

## 2019-05-18 DIAGNOSIS — Z95828 Presence of other vascular implants and grafts: Secondary | ICD-10-CM | POA: Diagnosis not present

## 2019-06-04 DIAGNOSIS — M85852 Other specified disorders of bone density and structure, left thigh: Secondary | ICD-10-CM | POA: Diagnosis not present

## 2019-06-05 DIAGNOSIS — C50212 Malignant neoplasm of upper-inner quadrant of left female breast: Secondary | ICD-10-CM | POA: Diagnosis not present

## 2019-06-05 DIAGNOSIS — Z171 Estrogen receptor negative status [ER-]: Secondary | ICD-10-CM | POA: Diagnosis not present

## 2019-06-26 DIAGNOSIS — Z171 Estrogen receptor negative status [ER-]: Secondary | ICD-10-CM | POA: Diagnosis not present

## 2019-06-26 DIAGNOSIS — C50212 Malignant neoplasm of upper-inner quadrant of left female breast: Secondary | ICD-10-CM | POA: Diagnosis not present

## 2019-06-26 DIAGNOSIS — R5382 Chronic fatigue, unspecified: Secondary | ICD-10-CM | POA: Diagnosis not present

## 2019-06-26 DIAGNOSIS — Z09 Encounter for follow-up examination after completed treatment for conditions other than malignant neoplasm: Secondary | ICD-10-CM | POA: Diagnosis not present

## 2019-06-27 DIAGNOSIS — Z510 Encounter for antineoplastic radiation therapy: Secondary | ICD-10-CM | POA: Diagnosis not present

## 2019-06-27 DIAGNOSIS — Z171 Estrogen receptor negative status [ER-]: Secondary | ICD-10-CM | POA: Diagnosis not present

## 2019-06-27 DIAGNOSIS — C50412 Malignant neoplasm of upper-outer quadrant of left female breast: Secondary | ICD-10-CM | POA: Diagnosis not present

## 2019-07-05 DIAGNOSIS — R5383 Other fatigue: Secondary | ICD-10-CM | POA: Diagnosis not present

## 2019-07-05 DIAGNOSIS — C50912 Malignant neoplasm of unspecified site of left female breast: Secondary | ICD-10-CM | POA: Diagnosis not present

## 2019-07-05 DIAGNOSIS — Z789 Other specified health status: Secondary | ICD-10-CM | POA: Diagnosis not present

## 2019-07-05 DIAGNOSIS — I10 Essential (primary) hypertension: Secondary | ICD-10-CM | POA: Diagnosis not present

## 2019-07-05 DIAGNOSIS — Z299 Encounter for prophylactic measures, unspecified: Secondary | ICD-10-CM | POA: Diagnosis not present

## 2019-07-05 DIAGNOSIS — Z6829 Body mass index (BMI) 29.0-29.9, adult: Secondary | ICD-10-CM | POA: Diagnosis not present

## 2019-07-05 DIAGNOSIS — R42 Dizziness and giddiness: Secondary | ICD-10-CM | POA: Diagnosis not present

## 2019-07-06 DIAGNOSIS — R42 Dizziness and giddiness: Secondary | ICD-10-CM | POA: Diagnosis not present

## 2019-07-06 DIAGNOSIS — R5383 Other fatigue: Secondary | ICD-10-CM | POA: Diagnosis not present

## 2019-07-11 DIAGNOSIS — C50919 Malignant neoplasm of unspecified site of unspecified female breast: Secondary | ICD-10-CM | POA: Diagnosis not present

## 2019-07-11 DIAGNOSIS — Z510 Encounter for antineoplastic radiation therapy: Secondary | ICD-10-CM | POA: Diagnosis not present

## 2019-07-11 DIAGNOSIS — Z171 Estrogen receptor negative status [ER-]: Secondary | ICD-10-CM | POA: Diagnosis not present

## 2019-07-11 DIAGNOSIS — C50412 Malignant neoplasm of upper-outer quadrant of left female breast: Secondary | ICD-10-CM | POA: Diagnosis not present

## 2019-07-17 DIAGNOSIS — M8580 Other specified disorders of bone density and structure, unspecified site: Secondary | ICD-10-CM | POA: Diagnosis not present

## 2019-07-17 DIAGNOSIS — C50212 Malignant neoplasm of upper-inner quadrant of left female breast: Secondary | ICD-10-CM | POA: Diagnosis not present

## 2019-07-17 DIAGNOSIS — Z09 Encounter for follow-up examination after completed treatment for conditions other than malignant neoplasm: Secondary | ICD-10-CM | POA: Diagnosis not present

## 2019-07-17 DIAGNOSIS — Z171 Estrogen receptor negative status [ER-]: Secondary | ICD-10-CM | POA: Diagnosis not present

## 2019-07-18 DIAGNOSIS — C50212 Malignant neoplasm of upper-inner quadrant of left female breast: Secondary | ICD-10-CM | POA: Diagnosis not present

## 2019-07-18 DIAGNOSIS — Z171 Estrogen receptor negative status [ER-]: Secondary | ICD-10-CM | POA: Diagnosis not present

## 2019-07-18 DIAGNOSIS — C50412 Malignant neoplasm of upper-outer quadrant of left female breast: Secondary | ICD-10-CM | POA: Diagnosis not present

## 2019-07-18 DIAGNOSIS — Z95828 Presence of other vascular implants and grafts: Secondary | ICD-10-CM | POA: Diagnosis not present

## 2019-07-18 DIAGNOSIS — Z5112 Encounter for antineoplastic immunotherapy: Secondary | ICD-10-CM | POA: Diagnosis not present

## 2019-07-20 DIAGNOSIS — R5383 Other fatigue: Secondary | ICD-10-CM | POA: Diagnosis not present

## 2019-07-20 DIAGNOSIS — C50212 Malignant neoplasm of upper-inner quadrant of left female breast: Secondary | ICD-10-CM | POA: Diagnosis not present

## 2019-07-20 DIAGNOSIS — F341 Dysthymic disorder: Secondary | ICD-10-CM | POA: Diagnosis not present

## 2019-07-20 DIAGNOSIS — C50412 Malignant neoplasm of upper-outer quadrant of left female breast: Secondary | ICD-10-CM | POA: Diagnosis not present

## 2019-07-20 DIAGNOSIS — Z01818 Encounter for other preprocedural examination: Secondary | ICD-10-CM | POA: Diagnosis not present

## 2019-07-20 DIAGNOSIS — R5382 Chronic fatigue, unspecified: Secondary | ICD-10-CM | POA: Diagnosis not present

## 2019-07-20 DIAGNOSIS — Z510 Encounter for antineoplastic radiation therapy: Secondary | ICD-10-CM | POA: Diagnosis not present

## 2019-07-20 DIAGNOSIS — Z171 Estrogen receptor negative status [ER-]: Secondary | ICD-10-CM | POA: Diagnosis not present

## 2019-07-24 DIAGNOSIS — C50412 Malignant neoplasm of upper-outer quadrant of left female breast: Secondary | ICD-10-CM | POA: Diagnosis not present

## 2019-07-24 DIAGNOSIS — F341 Dysthymic disorder: Secondary | ICD-10-CM | POA: Diagnosis not present

## 2019-07-24 DIAGNOSIS — R5383 Other fatigue: Secondary | ICD-10-CM | POA: Diagnosis not present

## 2019-07-24 DIAGNOSIS — Z171 Estrogen receptor negative status [ER-]: Secondary | ICD-10-CM | POA: Diagnosis not present

## 2019-07-24 DIAGNOSIS — R5382 Chronic fatigue, unspecified: Secondary | ICD-10-CM | POA: Diagnosis not present

## 2019-07-24 DIAGNOSIS — Z01818 Encounter for other preprocedural examination: Secondary | ICD-10-CM | POA: Diagnosis not present

## 2019-07-24 DIAGNOSIS — Z510 Encounter for antineoplastic radiation therapy: Secondary | ICD-10-CM | POA: Diagnosis not present

## 2019-07-24 DIAGNOSIS — C50212 Malignant neoplasm of upper-inner quadrant of left female breast: Secondary | ICD-10-CM | POA: Diagnosis not present

## 2019-07-30 DIAGNOSIS — C50212 Malignant neoplasm of upper-inner quadrant of left female breast: Secondary | ICD-10-CM | POA: Diagnosis not present

## 2019-07-30 DIAGNOSIS — F341 Dysthymic disorder: Secondary | ICD-10-CM | POA: Diagnosis not present

## 2019-07-30 DIAGNOSIS — Z510 Encounter for antineoplastic radiation therapy: Secondary | ICD-10-CM | POA: Diagnosis not present

## 2019-07-30 DIAGNOSIS — R5382 Chronic fatigue, unspecified: Secondary | ICD-10-CM | POA: Diagnosis not present

## 2019-07-30 DIAGNOSIS — C50412 Malignant neoplasm of upper-outer quadrant of left female breast: Secondary | ICD-10-CM | POA: Diagnosis not present

## 2019-07-30 DIAGNOSIS — Z01818 Encounter for other preprocedural examination: Secondary | ICD-10-CM | POA: Diagnosis not present

## 2019-07-30 DIAGNOSIS — R5383 Other fatigue: Secondary | ICD-10-CM | POA: Diagnosis not present

## 2019-07-30 DIAGNOSIS — Z171 Estrogen receptor negative status [ER-]: Secondary | ICD-10-CM | POA: Diagnosis not present

## 2019-07-31 DIAGNOSIS — Z171 Estrogen receptor negative status [ER-]: Secondary | ICD-10-CM | POA: Diagnosis not present

## 2019-07-31 DIAGNOSIS — R5382 Chronic fatigue, unspecified: Secondary | ICD-10-CM | POA: Diagnosis not present

## 2019-07-31 DIAGNOSIS — F341 Dysthymic disorder: Secondary | ICD-10-CM | POA: Diagnosis not present

## 2019-07-31 DIAGNOSIS — C50412 Malignant neoplasm of upper-outer quadrant of left female breast: Secondary | ICD-10-CM | POA: Diagnosis not present

## 2019-07-31 DIAGNOSIS — C50212 Malignant neoplasm of upper-inner quadrant of left female breast: Secondary | ICD-10-CM | POA: Diagnosis not present

## 2019-07-31 DIAGNOSIS — Z510 Encounter for antineoplastic radiation therapy: Secondary | ICD-10-CM | POA: Diagnosis not present

## 2019-07-31 DIAGNOSIS — R5383 Other fatigue: Secondary | ICD-10-CM | POA: Diagnosis not present

## 2019-08-01 DIAGNOSIS — M797 Fibromyalgia: Secondary | ICD-10-CM | POA: Diagnosis not present

## 2019-08-01 DIAGNOSIS — F341 Dysthymic disorder: Secondary | ICD-10-CM | POA: Diagnosis not present

## 2019-08-01 DIAGNOSIS — R5382 Chronic fatigue, unspecified: Secondary | ICD-10-CM | POA: Diagnosis not present

## 2019-08-01 DIAGNOSIS — C50912 Malignant neoplasm of unspecified site of left female breast: Secondary | ICD-10-CM | POA: Diagnosis not present

## 2019-08-01 DIAGNOSIS — I10 Essential (primary) hypertension: Secondary | ICD-10-CM | POA: Diagnosis not present

## 2019-08-01 DIAGNOSIS — R5383 Other fatigue: Secondary | ICD-10-CM | POA: Diagnosis not present

## 2019-08-01 DIAGNOSIS — Z299 Encounter for prophylactic measures, unspecified: Secondary | ICD-10-CM | POA: Diagnosis not present

## 2019-08-01 DIAGNOSIS — Z6828 Body mass index (BMI) 28.0-28.9, adult: Secondary | ICD-10-CM | POA: Diagnosis not present

## 2019-08-01 DIAGNOSIS — Z171 Estrogen receptor negative status [ER-]: Secondary | ICD-10-CM | POA: Diagnosis not present

## 2019-08-01 DIAGNOSIS — C50412 Malignant neoplasm of upper-outer quadrant of left female breast: Secondary | ICD-10-CM | POA: Diagnosis not present

## 2019-08-01 DIAGNOSIS — Z510 Encounter for antineoplastic radiation therapy: Secondary | ICD-10-CM | POA: Diagnosis not present

## 2019-08-01 DIAGNOSIS — N390 Urinary tract infection, site not specified: Secondary | ICD-10-CM | POA: Diagnosis not present

## 2019-08-02 DIAGNOSIS — Z510 Encounter for antineoplastic radiation therapy: Secondary | ICD-10-CM | POA: Diagnosis not present

## 2019-08-02 DIAGNOSIS — R5382 Chronic fatigue, unspecified: Secondary | ICD-10-CM | POA: Diagnosis not present

## 2019-08-02 DIAGNOSIS — R5383 Other fatigue: Secondary | ICD-10-CM | POA: Diagnosis not present

## 2019-08-02 DIAGNOSIS — F341 Dysthymic disorder: Secondary | ICD-10-CM | POA: Diagnosis not present

## 2019-08-02 DIAGNOSIS — C50412 Malignant neoplasm of upper-outer quadrant of left female breast: Secondary | ICD-10-CM | POA: Diagnosis not present

## 2019-08-02 DIAGNOSIS — Z171 Estrogen receptor negative status [ER-]: Secondary | ICD-10-CM | POA: Diagnosis not present

## 2019-08-03 DIAGNOSIS — Z171 Estrogen receptor negative status [ER-]: Secondary | ICD-10-CM | POA: Diagnosis not present

## 2019-08-03 DIAGNOSIS — R5382 Chronic fatigue, unspecified: Secondary | ICD-10-CM | POA: Diagnosis not present

## 2019-08-03 DIAGNOSIS — C50412 Malignant neoplasm of upper-outer quadrant of left female breast: Secondary | ICD-10-CM | POA: Diagnosis not present

## 2019-08-03 DIAGNOSIS — R5383 Other fatigue: Secondary | ICD-10-CM | POA: Diagnosis not present

## 2019-08-03 DIAGNOSIS — Z510 Encounter for antineoplastic radiation therapy: Secondary | ICD-10-CM | POA: Diagnosis not present

## 2019-08-03 DIAGNOSIS — F341 Dysthymic disorder: Secondary | ICD-10-CM | POA: Diagnosis not present

## 2019-08-06 DIAGNOSIS — Z510 Encounter for antineoplastic radiation therapy: Secondary | ICD-10-CM | POA: Diagnosis not present

## 2019-08-06 DIAGNOSIS — C50412 Malignant neoplasm of upper-outer quadrant of left female breast: Secondary | ICD-10-CM | POA: Diagnosis not present

## 2019-08-06 DIAGNOSIS — Z171 Estrogen receptor negative status [ER-]: Secondary | ICD-10-CM | POA: Diagnosis not present

## 2019-08-06 DIAGNOSIS — R5382 Chronic fatigue, unspecified: Secondary | ICD-10-CM | POA: Diagnosis not present

## 2019-08-06 DIAGNOSIS — Z452 Encounter for adjustment and management of vascular access device: Secondary | ICD-10-CM | POA: Diagnosis not present

## 2019-08-06 DIAGNOSIS — M8580 Other specified disorders of bone density and structure, unspecified site: Secondary | ICD-10-CM | POA: Diagnosis not present

## 2019-08-06 DIAGNOSIS — C50212 Malignant neoplasm of upper-inner quadrant of left female breast: Secondary | ICD-10-CM | POA: Diagnosis not present

## 2019-08-07 DIAGNOSIS — C50212 Malignant neoplasm of upper-inner quadrant of left female breast: Secondary | ICD-10-CM | POA: Diagnosis not present

## 2019-08-07 DIAGNOSIS — M8580 Other specified disorders of bone density and structure, unspecified site: Secondary | ICD-10-CM | POA: Diagnosis not present

## 2019-08-07 DIAGNOSIS — R5382 Chronic fatigue, unspecified: Secondary | ICD-10-CM | POA: Diagnosis not present

## 2019-08-07 DIAGNOSIS — Z171 Estrogen receptor negative status [ER-]: Secondary | ICD-10-CM | POA: Diagnosis not present

## 2019-08-07 DIAGNOSIS — Z510 Encounter for antineoplastic radiation therapy: Secondary | ICD-10-CM | POA: Diagnosis not present

## 2019-08-07 DIAGNOSIS — C50412 Malignant neoplasm of upper-outer quadrant of left female breast: Secondary | ICD-10-CM | POA: Diagnosis not present

## 2019-08-08 DIAGNOSIS — R5382 Chronic fatigue, unspecified: Secondary | ICD-10-CM | POA: Diagnosis not present

## 2019-08-08 DIAGNOSIS — Z171 Estrogen receptor negative status [ER-]: Secondary | ICD-10-CM | POA: Diagnosis not present

## 2019-08-08 DIAGNOSIS — C50412 Malignant neoplasm of upper-outer quadrant of left female breast: Secondary | ICD-10-CM | POA: Diagnosis not present

## 2019-08-08 DIAGNOSIS — C50212 Malignant neoplasm of upper-inner quadrant of left female breast: Secondary | ICD-10-CM | POA: Diagnosis not present

## 2019-08-08 DIAGNOSIS — Z510 Encounter for antineoplastic radiation therapy: Secondary | ICD-10-CM | POA: Diagnosis not present

## 2019-08-08 DIAGNOSIS — M8580 Other specified disorders of bone density and structure, unspecified site: Secondary | ICD-10-CM | POA: Diagnosis not present

## 2019-08-09 DIAGNOSIS — Z171 Estrogen receptor negative status [ER-]: Secondary | ICD-10-CM | POA: Diagnosis not present

## 2019-08-09 DIAGNOSIS — Z510 Encounter for antineoplastic radiation therapy: Secondary | ICD-10-CM | POA: Diagnosis not present

## 2019-08-09 DIAGNOSIS — H02055 Trichiasis without entropian left lower eyelid: Secondary | ICD-10-CM | POA: Diagnosis not present

## 2019-08-09 DIAGNOSIS — C50212 Malignant neoplasm of upper-inner quadrant of left female breast: Secondary | ICD-10-CM | POA: Diagnosis not present

## 2019-08-09 DIAGNOSIS — M8580 Other specified disorders of bone density and structure, unspecified site: Secondary | ICD-10-CM | POA: Diagnosis not present

## 2019-08-09 DIAGNOSIS — C50412 Malignant neoplasm of upper-outer quadrant of left female breast: Secondary | ICD-10-CM | POA: Diagnosis not present

## 2019-08-09 DIAGNOSIS — R5382 Chronic fatigue, unspecified: Secondary | ICD-10-CM | POA: Diagnosis not present

## 2019-08-10 DIAGNOSIS — M8580 Other specified disorders of bone density and structure, unspecified site: Secondary | ICD-10-CM | POA: Diagnosis not present

## 2019-08-10 DIAGNOSIS — R5382 Chronic fatigue, unspecified: Secondary | ICD-10-CM | POA: Diagnosis not present

## 2019-08-10 DIAGNOSIS — C50412 Malignant neoplasm of upper-outer quadrant of left female breast: Secondary | ICD-10-CM | POA: Diagnosis not present

## 2019-08-10 DIAGNOSIS — Z171 Estrogen receptor negative status [ER-]: Secondary | ICD-10-CM | POA: Diagnosis not present

## 2019-08-10 DIAGNOSIS — Z510 Encounter for antineoplastic radiation therapy: Secondary | ICD-10-CM | POA: Diagnosis not present

## 2019-08-10 DIAGNOSIS — C50212 Malignant neoplasm of upper-inner quadrant of left female breast: Secondary | ICD-10-CM | POA: Diagnosis not present

## 2019-08-13 DIAGNOSIS — C50212 Malignant neoplasm of upper-inner quadrant of left female breast: Secondary | ICD-10-CM | POA: Diagnosis not present

## 2019-08-13 DIAGNOSIS — M8580 Other specified disorders of bone density and structure, unspecified site: Secondary | ICD-10-CM | POA: Diagnosis not present

## 2019-08-13 DIAGNOSIS — Z510 Encounter for antineoplastic radiation therapy: Secondary | ICD-10-CM | POA: Diagnosis not present

## 2019-08-13 DIAGNOSIS — Z171 Estrogen receptor negative status [ER-]: Secondary | ICD-10-CM | POA: Diagnosis not present

## 2019-08-13 DIAGNOSIS — C50412 Malignant neoplasm of upper-outer quadrant of left female breast: Secondary | ICD-10-CM | POA: Diagnosis not present

## 2019-08-13 DIAGNOSIS — R5382 Chronic fatigue, unspecified: Secondary | ICD-10-CM | POA: Diagnosis not present

## 2019-08-14 DIAGNOSIS — Z171 Estrogen receptor negative status [ER-]: Secondary | ICD-10-CM | POA: Diagnosis not present

## 2019-08-14 DIAGNOSIS — R5382 Chronic fatigue, unspecified: Secondary | ICD-10-CM | POA: Diagnosis not present

## 2019-08-14 DIAGNOSIS — C50212 Malignant neoplasm of upper-inner quadrant of left female breast: Secondary | ICD-10-CM | POA: Diagnosis not present

## 2019-08-14 DIAGNOSIS — Z510 Encounter for antineoplastic radiation therapy: Secondary | ICD-10-CM | POA: Diagnosis not present

## 2019-08-14 DIAGNOSIS — M8580 Other specified disorders of bone density and structure, unspecified site: Secondary | ICD-10-CM | POA: Diagnosis not present

## 2019-08-14 DIAGNOSIS — C50412 Malignant neoplasm of upper-outer quadrant of left female breast: Secondary | ICD-10-CM | POA: Diagnosis not present

## 2019-08-15 DIAGNOSIS — R5382 Chronic fatigue, unspecified: Secondary | ICD-10-CM | POA: Diagnosis not present

## 2019-08-15 DIAGNOSIS — C50412 Malignant neoplasm of upper-outer quadrant of left female breast: Secondary | ICD-10-CM | POA: Diagnosis not present

## 2019-08-15 DIAGNOSIS — Z510 Encounter for antineoplastic radiation therapy: Secondary | ICD-10-CM | POA: Diagnosis not present

## 2019-08-15 DIAGNOSIS — C50212 Malignant neoplasm of upper-inner quadrant of left female breast: Secondary | ICD-10-CM | POA: Diagnosis not present

## 2019-08-15 DIAGNOSIS — M8580 Other specified disorders of bone density and structure, unspecified site: Secondary | ICD-10-CM | POA: Diagnosis not present

## 2019-08-15 DIAGNOSIS — Z171 Estrogen receptor negative status [ER-]: Secondary | ICD-10-CM | POA: Diagnosis not present

## 2019-08-16 DIAGNOSIS — C50212 Malignant neoplasm of upper-inner quadrant of left female breast: Secondary | ICD-10-CM | POA: Diagnosis not present

## 2019-08-16 DIAGNOSIS — M8580 Other specified disorders of bone density and structure, unspecified site: Secondary | ICD-10-CM | POA: Diagnosis not present

## 2019-08-16 DIAGNOSIS — R5382 Chronic fatigue, unspecified: Secondary | ICD-10-CM | POA: Diagnosis not present

## 2019-08-16 DIAGNOSIS — Z171 Estrogen receptor negative status [ER-]: Secondary | ICD-10-CM | POA: Diagnosis not present

## 2019-08-16 DIAGNOSIS — Z510 Encounter for antineoplastic radiation therapy: Secondary | ICD-10-CM | POA: Diagnosis not present

## 2019-08-16 DIAGNOSIS — C50412 Malignant neoplasm of upper-outer quadrant of left female breast: Secondary | ICD-10-CM | POA: Diagnosis not present

## 2019-08-17 DIAGNOSIS — R5382 Chronic fatigue, unspecified: Secondary | ICD-10-CM | POA: Diagnosis not present

## 2019-08-17 DIAGNOSIS — Z171 Estrogen receptor negative status [ER-]: Secondary | ICD-10-CM | POA: Diagnosis not present

## 2019-08-17 DIAGNOSIS — C50412 Malignant neoplasm of upper-outer quadrant of left female breast: Secondary | ICD-10-CM | POA: Diagnosis not present

## 2019-08-17 DIAGNOSIS — Z510 Encounter for antineoplastic radiation therapy: Secondary | ICD-10-CM | POA: Diagnosis not present

## 2019-08-17 DIAGNOSIS — M8580 Other specified disorders of bone density and structure, unspecified site: Secondary | ICD-10-CM | POA: Diagnosis not present

## 2019-08-17 DIAGNOSIS — C50212 Malignant neoplasm of upper-inner quadrant of left female breast: Secondary | ICD-10-CM | POA: Diagnosis not present

## 2019-08-20 DIAGNOSIS — C50412 Malignant neoplasm of upper-outer quadrant of left female breast: Secondary | ICD-10-CM | POA: Diagnosis not present

## 2019-08-20 DIAGNOSIS — C50212 Malignant neoplasm of upper-inner quadrant of left female breast: Secondary | ICD-10-CM | POA: Diagnosis not present

## 2019-08-20 DIAGNOSIS — M8580 Other specified disorders of bone density and structure, unspecified site: Secondary | ICD-10-CM | POA: Diagnosis not present

## 2019-08-20 DIAGNOSIS — R5382 Chronic fatigue, unspecified: Secondary | ICD-10-CM | POA: Diagnosis not present

## 2019-08-20 DIAGNOSIS — Z510 Encounter for antineoplastic radiation therapy: Secondary | ICD-10-CM | POA: Diagnosis not present

## 2019-08-20 DIAGNOSIS — Z171 Estrogen receptor negative status [ER-]: Secondary | ICD-10-CM | POA: Diagnosis not present

## 2019-08-21 DIAGNOSIS — R5382 Chronic fatigue, unspecified: Secondary | ICD-10-CM | POA: Diagnosis not present

## 2019-08-21 DIAGNOSIS — Z510 Encounter for antineoplastic radiation therapy: Secondary | ICD-10-CM | POA: Diagnosis not present

## 2019-08-21 DIAGNOSIS — Z171 Estrogen receptor negative status [ER-]: Secondary | ICD-10-CM | POA: Diagnosis not present

## 2019-08-21 DIAGNOSIS — C50212 Malignant neoplasm of upper-inner quadrant of left female breast: Secondary | ICD-10-CM | POA: Diagnosis not present

## 2019-08-21 DIAGNOSIS — C50412 Malignant neoplasm of upper-outer quadrant of left female breast: Secondary | ICD-10-CM | POA: Diagnosis not present

## 2019-08-21 DIAGNOSIS — M8580 Other specified disorders of bone density and structure, unspecified site: Secondary | ICD-10-CM | POA: Diagnosis not present

## 2019-08-22 DIAGNOSIS — C50412 Malignant neoplasm of upper-outer quadrant of left female breast: Secondary | ICD-10-CM | POA: Diagnosis not present

## 2019-08-22 DIAGNOSIS — M8580 Other specified disorders of bone density and structure, unspecified site: Secondary | ICD-10-CM | POA: Diagnosis not present

## 2019-08-22 DIAGNOSIS — Z171 Estrogen receptor negative status [ER-]: Secondary | ICD-10-CM | POA: Diagnosis not present

## 2019-08-22 DIAGNOSIS — R5382 Chronic fatigue, unspecified: Secondary | ICD-10-CM | POA: Diagnosis not present

## 2019-08-22 DIAGNOSIS — C50212 Malignant neoplasm of upper-inner quadrant of left female breast: Secondary | ICD-10-CM | POA: Diagnosis not present

## 2019-08-22 DIAGNOSIS — Z510 Encounter for antineoplastic radiation therapy: Secondary | ICD-10-CM | POA: Diagnosis not present

## 2019-08-23 DIAGNOSIS — C50412 Malignant neoplasm of upper-outer quadrant of left female breast: Secondary | ICD-10-CM | POA: Diagnosis not present

## 2019-08-23 DIAGNOSIS — M8580 Other specified disorders of bone density and structure, unspecified site: Secondary | ICD-10-CM | POA: Diagnosis not present

## 2019-08-23 DIAGNOSIS — Z510 Encounter for antineoplastic radiation therapy: Secondary | ICD-10-CM | POA: Diagnosis not present

## 2019-08-23 DIAGNOSIS — C50212 Malignant neoplasm of upper-inner quadrant of left female breast: Secondary | ICD-10-CM | POA: Diagnosis not present

## 2019-08-23 DIAGNOSIS — Z171 Estrogen receptor negative status [ER-]: Secondary | ICD-10-CM | POA: Diagnosis not present

## 2019-08-23 DIAGNOSIS — R5382 Chronic fatigue, unspecified: Secondary | ICD-10-CM | POA: Diagnosis not present

## 2019-08-24 DIAGNOSIS — C50412 Malignant neoplasm of upper-outer quadrant of left female breast: Secondary | ICD-10-CM | POA: Diagnosis not present

## 2019-08-24 DIAGNOSIS — C50212 Malignant neoplasm of upper-inner quadrant of left female breast: Secondary | ICD-10-CM | POA: Diagnosis not present

## 2019-08-24 DIAGNOSIS — R5382 Chronic fatigue, unspecified: Secondary | ICD-10-CM | POA: Diagnosis not present

## 2019-08-24 DIAGNOSIS — Z171 Estrogen receptor negative status [ER-]: Secondary | ICD-10-CM | POA: Diagnosis not present

## 2019-08-24 DIAGNOSIS — M8580 Other specified disorders of bone density and structure, unspecified site: Secondary | ICD-10-CM | POA: Diagnosis not present

## 2019-08-24 DIAGNOSIS — Z510 Encounter for antineoplastic radiation therapy: Secondary | ICD-10-CM | POA: Diagnosis not present

## 2019-08-27 DIAGNOSIS — C50212 Malignant neoplasm of upper-inner quadrant of left female breast: Secondary | ICD-10-CM | POA: Diagnosis not present

## 2019-08-27 DIAGNOSIS — M8580 Other specified disorders of bone density and structure, unspecified site: Secondary | ICD-10-CM | POA: Diagnosis not present

## 2019-08-27 DIAGNOSIS — Z510 Encounter for antineoplastic radiation therapy: Secondary | ICD-10-CM | POA: Diagnosis not present

## 2019-08-27 DIAGNOSIS — R5382 Chronic fatigue, unspecified: Secondary | ICD-10-CM | POA: Diagnosis not present

## 2019-08-27 DIAGNOSIS — C50412 Malignant neoplasm of upper-outer quadrant of left female breast: Secondary | ICD-10-CM | POA: Diagnosis not present

## 2019-08-27 DIAGNOSIS — Z171 Estrogen receptor negative status [ER-]: Secondary | ICD-10-CM | POA: Diagnosis not present

## 2019-09-06 DIAGNOSIS — C50412 Malignant neoplasm of upper-outer quadrant of left female breast: Secondary | ICD-10-CM | POA: Diagnosis not present

## 2019-09-06 DIAGNOSIS — Z171 Estrogen receptor negative status [ER-]: Secondary | ICD-10-CM | POA: Diagnosis not present

## 2019-09-06 DIAGNOSIS — Z510 Encounter for antineoplastic radiation therapy: Secondary | ICD-10-CM | POA: Diagnosis not present

## 2019-10-03 DIAGNOSIS — Z452 Encounter for adjustment and management of vascular access device: Secondary | ICD-10-CM | POA: Diagnosis not present

## 2019-10-17 DIAGNOSIS — Z299 Encounter for prophylactic measures, unspecified: Secondary | ICD-10-CM | POA: Diagnosis not present

## 2019-10-17 DIAGNOSIS — I10 Essential (primary) hypertension: Secondary | ICD-10-CM | POA: Diagnosis not present

## 2019-10-17 DIAGNOSIS — R350 Frequency of micturition: Secondary | ICD-10-CM | POA: Diagnosis not present

## 2019-10-17 DIAGNOSIS — K121 Other forms of stomatitis: Secondary | ICD-10-CM | POA: Diagnosis not present

## 2019-10-17 DIAGNOSIS — N390 Urinary tract infection, site not specified: Secondary | ICD-10-CM | POA: Diagnosis not present

## 2019-10-25 DIAGNOSIS — C50212 Malignant neoplasm of upper-inner quadrant of left female breast: Secondary | ICD-10-CM | POA: Diagnosis not present

## 2019-11-08 DIAGNOSIS — C50212 Malignant neoplasm of upper-inner quadrant of left female breast: Secondary | ICD-10-CM | POA: Diagnosis not present

## 2019-11-08 DIAGNOSIS — Z95828 Presence of other vascular implants and grafts: Secondary | ICD-10-CM | POA: Diagnosis not present

## 2019-11-08 DIAGNOSIS — R5382 Chronic fatigue, unspecified: Secondary | ICD-10-CM | POA: Diagnosis not present

## 2019-11-08 DIAGNOSIS — Z09 Encounter for follow-up examination after completed treatment for conditions other than malignant neoplasm: Secondary | ICD-10-CM | POA: Diagnosis not present

## 2019-11-08 DIAGNOSIS — M8580 Other specified disorders of bone density and structure, unspecified site: Secondary | ICD-10-CM | POA: Diagnosis not present

## 2019-11-08 DIAGNOSIS — Z171 Estrogen receptor negative status [ER-]: Secondary | ICD-10-CM | POA: Diagnosis not present

## 2019-11-15 DIAGNOSIS — H25813 Combined forms of age-related cataract, bilateral: Secondary | ICD-10-CM | POA: Diagnosis not present

## 2019-11-15 DIAGNOSIS — H43813 Vitreous degeneration, bilateral: Secondary | ICD-10-CM | POA: Diagnosis not present

## 2019-11-15 DIAGNOSIS — H52203 Unspecified astigmatism, bilateral: Secondary | ICD-10-CM | POA: Diagnosis not present

## 2019-11-15 DIAGNOSIS — H532 Diplopia: Secondary | ICD-10-CM | POA: Diagnosis not present

## 2019-12-18 DIAGNOSIS — H268 Other specified cataract: Secondary | ICD-10-CM | POA: Diagnosis not present

## 2020-01-15 DIAGNOSIS — F322 Major depressive disorder, single episode, severe without psychotic features: Secondary | ICD-10-CM | POA: Diagnosis not present

## 2020-01-15 DIAGNOSIS — N390 Urinary tract infection, site not specified: Secondary | ICD-10-CM | POA: Diagnosis not present

## 2020-01-15 DIAGNOSIS — Z299 Encounter for prophylactic measures, unspecified: Secondary | ICD-10-CM | POA: Diagnosis not present

## 2020-01-15 DIAGNOSIS — I10 Essential (primary) hypertension: Secondary | ICD-10-CM | POA: Diagnosis not present

## 2020-01-15 DIAGNOSIS — C50912 Malignant neoplasm of unspecified site of left female breast: Secondary | ICD-10-CM | POA: Diagnosis not present

## 2020-01-21 DIAGNOSIS — Z452 Encounter for adjustment and management of vascular access device: Secondary | ICD-10-CM | POA: Diagnosis not present

## 2020-01-30 DIAGNOSIS — Z23 Encounter for immunization: Secondary | ICD-10-CM | POA: Diagnosis not present

## 2020-02-01 DIAGNOSIS — R5382 Chronic fatigue, unspecified: Secondary | ICD-10-CM | POA: Diagnosis not present

## 2020-02-01 DIAGNOSIS — C50212 Malignant neoplasm of upper-inner quadrant of left female breast: Secondary | ICD-10-CM | POA: Diagnosis not present

## 2020-02-01 DIAGNOSIS — M8580 Other specified disorders of bone density and structure, unspecified site: Secondary | ICD-10-CM | POA: Diagnosis not present

## 2020-02-01 DIAGNOSIS — Z171 Estrogen receptor negative status [ER-]: Secondary | ICD-10-CM | POA: Diagnosis not present

## 2020-02-01 DIAGNOSIS — Z09 Encounter for follow-up examination after completed treatment for conditions other than malignant neoplasm: Secondary | ICD-10-CM | POA: Diagnosis not present

## 2020-02-04 DIAGNOSIS — Z08 Encounter for follow-up examination after completed treatment for malignant neoplasm: Secondary | ICD-10-CM | POA: Diagnosis not present

## 2020-02-04 DIAGNOSIS — Z853 Personal history of malignant neoplasm of breast: Secondary | ICD-10-CM | POA: Diagnosis not present

## 2020-02-04 DIAGNOSIS — C50412 Malignant neoplasm of upper-outer quadrant of left female breast: Secondary | ICD-10-CM | POA: Diagnosis not present

## 2020-02-04 DIAGNOSIS — M549 Dorsalgia, unspecified: Secondary | ICD-10-CM | POA: Diagnosis not present

## 2020-02-04 DIAGNOSIS — R5383 Other fatigue: Secondary | ICD-10-CM | POA: Diagnosis not present

## 2020-02-04 DIAGNOSIS — F329 Major depressive disorder, single episode, unspecified: Secondary | ICD-10-CM | POA: Diagnosis not present

## 2020-02-04 DIAGNOSIS — H532 Diplopia: Secondary | ICD-10-CM | POA: Diagnosis not present

## 2020-02-04 DIAGNOSIS — G8929 Other chronic pain: Secondary | ICD-10-CM | POA: Diagnosis not present

## 2020-02-04 DIAGNOSIS — Z170 Estrogen receptor positive status [ER+]: Secondary | ICD-10-CM | POA: Diagnosis not present

## 2020-02-04 DIAGNOSIS — Z923 Personal history of irradiation: Secondary | ICD-10-CM | POA: Diagnosis not present

## 2020-02-05 DIAGNOSIS — C50912 Malignant neoplasm of unspecified site of left female breast: Secondary | ICD-10-CM | POA: Diagnosis not present

## 2020-02-05 DIAGNOSIS — N390 Urinary tract infection, site not specified: Secondary | ICD-10-CM | POA: Diagnosis not present

## 2020-02-05 DIAGNOSIS — F322 Major depressive disorder, single episode, severe without psychotic features: Secondary | ICD-10-CM | POA: Diagnosis not present

## 2020-02-05 DIAGNOSIS — Z299 Encounter for prophylactic measures, unspecified: Secondary | ICD-10-CM | POA: Diagnosis not present

## 2020-02-05 DIAGNOSIS — I10 Essential (primary) hypertension: Secondary | ICD-10-CM | POA: Diagnosis not present

## 2020-02-13 DIAGNOSIS — I10 Essential (primary) hypertension: Secondary | ICD-10-CM | POA: Diagnosis not present

## 2020-02-13 DIAGNOSIS — Z299 Encounter for prophylactic measures, unspecified: Secondary | ICD-10-CM | POA: Diagnosis not present

## 2020-02-13 DIAGNOSIS — Z0000 Encounter for general adult medical examination without abnormal findings: Secondary | ICD-10-CM | POA: Diagnosis not present

## 2020-02-13 DIAGNOSIS — E7800 Pure hypercholesterolemia, unspecified: Secondary | ICD-10-CM | POA: Diagnosis not present

## 2020-02-13 DIAGNOSIS — Z6831 Body mass index (BMI) 31.0-31.9, adult: Secondary | ICD-10-CM | POA: Diagnosis not present

## 2020-02-13 DIAGNOSIS — Z79899 Other long term (current) drug therapy: Secondary | ICD-10-CM | POA: Diagnosis not present

## 2020-02-13 DIAGNOSIS — F322 Major depressive disorder, single episode, severe without psychotic features: Secondary | ICD-10-CM | POA: Diagnosis not present

## 2020-02-13 DIAGNOSIS — Z1331 Encounter for screening for depression: Secondary | ICD-10-CM | POA: Diagnosis not present

## 2020-02-13 DIAGNOSIS — R5383 Other fatigue: Secondary | ICD-10-CM | POA: Diagnosis not present

## 2020-02-13 DIAGNOSIS — Z1339 Encounter for screening examination for other mental health and behavioral disorders: Secondary | ICD-10-CM | POA: Diagnosis not present

## 2020-02-13 DIAGNOSIS — E559 Vitamin D deficiency, unspecified: Secondary | ICD-10-CM | POA: Diagnosis not present

## 2020-02-13 DIAGNOSIS — Z7189 Other specified counseling: Secondary | ICD-10-CM | POA: Diagnosis not present

## 2020-02-21 DIAGNOSIS — Z23 Encounter for immunization: Secondary | ICD-10-CM | POA: Diagnosis not present

## 2020-02-21 DIAGNOSIS — Z792 Long term (current) use of antibiotics: Secondary | ICD-10-CM | POA: Diagnosis not present

## 2020-02-21 DIAGNOSIS — I10 Essential (primary) hypertension: Secondary | ICD-10-CM | POA: Diagnosis not present

## 2020-02-21 DIAGNOSIS — F419 Anxiety disorder, unspecified: Secondary | ICD-10-CM | POA: Diagnosis not present

## 2020-02-21 DIAGNOSIS — C50412 Malignant neoplasm of upper-outer quadrant of left female breast: Secondary | ICD-10-CM | POA: Diagnosis not present

## 2020-02-21 DIAGNOSIS — M797 Fibromyalgia: Secondary | ICD-10-CM | POA: Diagnosis not present

## 2020-02-21 DIAGNOSIS — F329 Major depressive disorder, single episode, unspecified: Secondary | ICD-10-CM | POA: Diagnosis not present

## 2020-02-27 DIAGNOSIS — Z86011 Personal history of benign neoplasm of the brain: Secondary | ICD-10-CM | POA: Diagnosis not present

## 2020-02-27 DIAGNOSIS — D329 Benign neoplasm of meninges, unspecified: Secondary | ICD-10-CM | POA: Diagnosis not present

## 2020-02-27 DIAGNOSIS — R2689 Other abnormalities of gait and mobility: Secondary | ICD-10-CM | POA: Diagnosis not present

## 2020-02-27 DIAGNOSIS — R413 Other amnesia: Secondary | ICD-10-CM | POA: Diagnosis not present

## 2020-03-13 DIAGNOSIS — Z9889 Other specified postprocedural states: Secondary | ICD-10-CM | POA: Diagnosis not present

## 2020-03-13 DIAGNOSIS — R42 Dizziness and giddiness: Secondary | ICD-10-CM | POA: Diagnosis not present

## 2020-03-13 DIAGNOSIS — R9082 White matter disease, unspecified: Secondary | ICD-10-CM | POA: Diagnosis not present

## 2020-03-13 DIAGNOSIS — G9389 Other specified disorders of brain: Secondary | ICD-10-CM | POA: Diagnosis not present

## 2020-03-13 DIAGNOSIS — Z86011 Personal history of benign neoplasm of the brain: Secondary | ICD-10-CM | POA: Diagnosis not present

## 2020-03-19 DIAGNOSIS — D329 Benign neoplasm of meninges, unspecified: Secondary | ICD-10-CM | POA: Diagnosis not present

## 2020-03-19 DIAGNOSIS — R413 Other amnesia: Secondary | ICD-10-CM | POA: Diagnosis not present

## 2020-03-19 DIAGNOSIS — Z86011 Personal history of benign neoplasm of the brain: Secondary | ICD-10-CM | POA: Diagnosis not present

## 2020-04-16 DIAGNOSIS — I10 Essential (primary) hypertension: Secondary | ICD-10-CM | POA: Diagnosis not present

## 2020-04-16 DIAGNOSIS — F322 Major depressive disorder, single episode, severe without psychotic features: Secondary | ICD-10-CM | POA: Diagnosis not present

## 2020-04-16 DIAGNOSIS — Z299 Encounter for prophylactic measures, unspecified: Secondary | ICD-10-CM | POA: Diagnosis not present

## 2020-04-16 DIAGNOSIS — C50912 Malignant neoplasm of unspecified site of left female breast: Secondary | ICD-10-CM | POA: Diagnosis not present

## 2020-04-30 DIAGNOSIS — Z9889 Other specified postprocedural states: Secondary | ICD-10-CM | POA: Diagnosis not present

## 2020-04-30 DIAGNOSIS — N6489 Other specified disorders of breast: Secondary | ICD-10-CM | POA: Diagnosis not present

## 2020-04-30 DIAGNOSIS — C50412 Malignant neoplasm of upper-outer quadrant of left female breast: Secondary | ICD-10-CM | POA: Diagnosis not present

## 2020-04-30 DIAGNOSIS — L905 Scar conditions and fibrosis of skin: Secondary | ICD-10-CM | POA: Diagnosis not present

## 2020-04-30 DIAGNOSIS — R921 Mammographic calcification found on diagnostic imaging of breast: Secondary | ICD-10-CM | POA: Diagnosis not present

## 2020-05-13 DIAGNOSIS — H532 Diplopia: Secondary | ICD-10-CM | POA: Diagnosis not present

## 2020-05-23 DIAGNOSIS — H16223 Keratoconjunctivitis sicca, not specified as Sjogren's, bilateral: Secondary | ICD-10-CM | POA: Diagnosis not present

## 2020-05-28 DIAGNOSIS — Z09 Encounter for follow-up examination after completed treatment for conditions other than malignant neoplasm: Secondary | ICD-10-CM | POA: Diagnosis not present

## 2020-05-28 DIAGNOSIS — R5382 Chronic fatigue, unspecified: Secondary | ICD-10-CM | POA: Diagnosis not present

## 2020-05-28 DIAGNOSIS — Z780 Asymptomatic menopausal state: Secondary | ICD-10-CM | POA: Diagnosis not present

## 2020-05-28 DIAGNOSIS — C50412 Malignant neoplasm of upper-outer quadrant of left female breast: Secondary | ICD-10-CM | POA: Diagnosis not present

## 2020-05-28 DIAGNOSIS — M8580 Other specified disorders of bone density and structure, unspecified site: Secondary | ICD-10-CM | POA: Diagnosis not present

## 2020-05-28 DIAGNOSIS — M797 Fibromyalgia: Secondary | ICD-10-CM | POA: Diagnosis not present

## 2020-06-05 DIAGNOSIS — M549 Dorsalgia, unspecified: Secondary | ICD-10-CM | POA: Diagnosis not present

## 2020-06-05 DIAGNOSIS — M8580 Other specified disorders of bone density and structure, unspecified site: Secondary | ICD-10-CM | POA: Diagnosis not present

## 2020-06-05 DIAGNOSIS — Z780 Asymptomatic menopausal state: Secondary | ICD-10-CM | POA: Diagnosis not present

## 2020-06-05 DIAGNOSIS — Z08 Encounter for follow-up examination after completed treatment for malignant neoplasm: Secondary | ICD-10-CM | POA: Diagnosis not present

## 2020-06-05 DIAGNOSIS — R5382 Chronic fatigue, unspecified: Secondary | ICD-10-CM | POA: Diagnosis not present

## 2020-06-05 DIAGNOSIS — T451X5A Adverse effect of antineoplastic and immunosuppressive drugs, initial encounter: Secondary | ICD-10-CM | POA: Diagnosis not present

## 2020-06-05 DIAGNOSIS — G8929 Other chronic pain: Secondary | ICD-10-CM | POA: Diagnosis not present

## 2020-06-05 DIAGNOSIS — C50412 Malignant neoplasm of upper-outer quadrant of left female breast: Secondary | ICD-10-CM | POA: Diagnosis not present

## 2020-06-05 DIAGNOSIS — G620 Drug-induced polyneuropathy: Secondary | ICD-10-CM | POA: Diagnosis not present

## 2020-06-05 DIAGNOSIS — H532 Diplopia: Secondary | ICD-10-CM | POA: Diagnosis not present

## 2020-06-05 DIAGNOSIS — Z170 Estrogen receptor positive status [ER+]: Secondary | ICD-10-CM | POA: Diagnosis not present

## 2020-06-05 DIAGNOSIS — Z923 Personal history of irradiation: Secondary | ICD-10-CM | POA: Diagnosis not present

## 2020-06-05 DIAGNOSIS — Z853 Personal history of malignant neoplasm of breast: Secondary | ICD-10-CM | POA: Diagnosis not present

## 2020-06-05 DIAGNOSIS — F329 Major depressive disorder, single episode, unspecified: Secondary | ICD-10-CM | POA: Diagnosis not present

## 2020-06-05 DIAGNOSIS — R5383 Other fatigue: Secondary | ICD-10-CM | POA: Diagnosis not present

## 2020-09-16 DIAGNOSIS — M2041 Other hammer toe(s) (acquired), right foot: Secondary | ICD-10-CM | POA: Diagnosis not present

## 2020-09-16 DIAGNOSIS — M79671 Pain in right foot: Secondary | ICD-10-CM | POA: Diagnosis not present

## 2020-09-16 DIAGNOSIS — L609 Nail disorder, unspecified: Secondary | ICD-10-CM | POA: Diagnosis not present

## 2020-09-16 DIAGNOSIS — M79674 Pain in right toe(s): Secondary | ICD-10-CM | POA: Diagnosis not present

## 2020-09-29 DIAGNOSIS — C50412 Malignant neoplasm of upper-outer quadrant of left female breast: Secondary | ICD-10-CM | POA: Diagnosis not present

## 2020-09-29 DIAGNOSIS — Z780 Asymptomatic menopausal state: Secondary | ICD-10-CM | POA: Diagnosis not present

## 2020-09-29 DIAGNOSIS — Z09 Encounter for follow-up examination after completed treatment for conditions other than malignant neoplasm: Secondary | ICD-10-CM | POA: Diagnosis not present

## 2020-09-29 DIAGNOSIS — M8580 Other specified disorders of bone density and structure, unspecified site: Secondary | ICD-10-CM | POA: Diagnosis not present

## 2020-10-02 DIAGNOSIS — R21 Rash and other nonspecific skin eruption: Secondary | ICD-10-CM | POA: Diagnosis not present

## 2020-10-02 DIAGNOSIS — Z853 Personal history of malignant neoplasm of breast: Secondary | ICD-10-CM | POA: Diagnosis not present

## 2020-10-02 DIAGNOSIS — Z170 Estrogen receptor positive status [ER+]: Secondary | ICD-10-CM | POA: Diagnosis not present

## 2020-10-02 DIAGNOSIS — Z923 Personal history of irradiation: Secondary | ICD-10-CM | POA: Diagnosis not present

## 2020-10-02 DIAGNOSIS — Z08 Encounter for follow-up examination after completed treatment for malignant neoplasm: Secondary | ICD-10-CM | POA: Diagnosis not present

## 2020-10-02 DIAGNOSIS — Z9221 Personal history of antineoplastic chemotherapy: Secondary | ICD-10-CM | POA: Diagnosis not present

## 2020-10-02 DIAGNOSIS — C50412 Malignant neoplasm of upper-outer quadrant of left female breast: Secondary | ICD-10-CM | POA: Diagnosis not present

## 2020-10-02 DIAGNOSIS — Z780 Asymptomatic menopausal state: Secondary | ICD-10-CM | POA: Diagnosis not present

## 2020-10-02 DIAGNOSIS — M8580 Other specified disorders of bone density and structure, unspecified site: Secondary | ICD-10-CM | POA: Diagnosis not present

## 2020-10-23 DIAGNOSIS — L920 Granuloma annulare: Secondary | ICD-10-CM | POA: Diagnosis not present

## 2020-10-23 DIAGNOSIS — D239 Other benign neoplasm of skin, unspecified: Secondary | ICD-10-CM | POA: Diagnosis not present

## 2020-10-23 DIAGNOSIS — L271 Localized skin eruption due to drugs and medicaments taken internally: Secondary | ICD-10-CM | POA: Diagnosis not present

## 2020-10-23 DIAGNOSIS — L820 Inflamed seborrheic keratosis: Secondary | ICD-10-CM | POA: Diagnosis not present

## 2020-10-23 DIAGNOSIS — D171 Benign lipomatous neoplasm of skin and subcutaneous tissue of trunk: Secondary | ICD-10-CM | POA: Diagnosis not present

## 2020-10-23 DIAGNOSIS — D1801 Hemangioma of skin and subcutaneous tissue: Secondary | ICD-10-CM | POA: Diagnosis not present

## 2020-10-23 DIAGNOSIS — D485 Neoplasm of uncertain behavior of skin: Secondary | ICD-10-CM | POA: Diagnosis not present

## 2020-11-06 DIAGNOSIS — C50412 Malignant neoplasm of upper-outer quadrant of left female breast: Secondary | ICD-10-CM | POA: Diagnosis not present

## 2020-11-11 DIAGNOSIS — Z23 Encounter for immunization: Secondary | ICD-10-CM | POA: Diagnosis not present

## 2020-12-02 DIAGNOSIS — L600 Ingrowing nail: Secondary | ICD-10-CM | POA: Diagnosis not present

## 2020-12-02 DIAGNOSIS — M79674 Pain in right toe(s): Secondary | ICD-10-CM | POA: Diagnosis not present

## 2020-12-02 DIAGNOSIS — M79671 Pain in right foot: Secondary | ICD-10-CM | POA: Diagnosis not present

## 2020-12-02 DIAGNOSIS — L03031 Cellulitis of right toe: Secondary | ICD-10-CM | POA: Diagnosis not present

## 2020-12-18 DIAGNOSIS — L03031 Cellulitis of right toe: Secondary | ICD-10-CM | POA: Diagnosis not present

## 2020-12-18 DIAGNOSIS — M79674 Pain in right toe(s): Secondary | ICD-10-CM | POA: Diagnosis not present

## 2020-12-18 DIAGNOSIS — L600 Ingrowing nail: Secondary | ICD-10-CM | POA: Diagnosis not present

## 2020-12-18 DIAGNOSIS — M79671 Pain in right foot: Secondary | ICD-10-CM | POA: Diagnosis not present

## 2020-12-24 DIAGNOSIS — C50412 Malignant neoplasm of upper-outer quadrant of left female breast: Secondary | ICD-10-CM | POA: Diagnosis not present

## 2020-12-24 DIAGNOSIS — N6321 Unspecified lump in the left breast, upper outer quadrant: Secondary | ICD-10-CM | POA: Diagnosis not present

## 2020-12-24 DIAGNOSIS — R921 Mammographic calcification found on diagnostic imaging of breast: Secondary | ICD-10-CM | POA: Diagnosis not present

## 2020-12-24 DIAGNOSIS — R922 Inconclusive mammogram: Secondary | ICD-10-CM | POA: Diagnosis not present

## 2020-12-25 ENCOUNTER — Other Ambulatory Visit: Payer: Self-pay | Admitting: Oncology

## 2020-12-25 DIAGNOSIS — R928 Other abnormal and inconclusive findings on diagnostic imaging of breast: Secondary | ICD-10-CM

## 2021-01-01 DIAGNOSIS — M79671 Pain in right foot: Secondary | ICD-10-CM | POA: Diagnosis not present

## 2021-01-01 DIAGNOSIS — L600 Ingrowing nail: Secondary | ICD-10-CM | POA: Diagnosis not present

## 2021-01-01 DIAGNOSIS — L03031 Cellulitis of right toe: Secondary | ICD-10-CM | POA: Diagnosis not present

## 2021-01-01 DIAGNOSIS — M79674 Pain in right toe(s): Secondary | ICD-10-CM | POA: Diagnosis not present

## 2021-01-02 ENCOUNTER — Other Ambulatory Visit: Payer: Self-pay

## 2021-01-02 ENCOUNTER — Ambulatory Visit
Admission: RE | Admit: 2021-01-02 | Discharge: 2021-01-02 | Disposition: A | Discharge status: Home / Self Care | Payer: Medicare Other | Source: Ambulatory Visit | Attending: Oncology | Admitting: Oncology

## 2021-01-02 DIAGNOSIS — R928 Other abnormal and inconclusive findings on diagnostic imaging of breast: Secondary | ICD-10-CM

## 2021-01-02 DIAGNOSIS — N6032 Fibrosclerosis of left breast: Secondary | ICD-10-CM | POA: Diagnosis not present

## 2021-02-04 DIAGNOSIS — Z299 Encounter for prophylactic measures, unspecified: Secondary | ICD-10-CM | POA: Diagnosis not present

## 2021-02-04 DIAGNOSIS — N390 Urinary tract infection, site not specified: Secondary | ICD-10-CM | POA: Diagnosis not present

## 2021-02-04 DIAGNOSIS — R109 Unspecified abdominal pain: Secondary | ICD-10-CM | POA: Diagnosis not present

## 2021-02-04 DIAGNOSIS — R350 Frequency of micturition: Secondary | ICD-10-CM | POA: Diagnosis not present

## 2021-02-04 DIAGNOSIS — I10 Essential (primary) hypertension: Secondary | ICD-10-CM | POA: Diagnosis not present

## 2021-03-17 DIAGNOSIS — Z23 Encounter for immunization: Secondary | ICD-10-CM | POA: Diagnosis not present

## 2021-03-25 DIAGNOSIS — Z780 Asymptomatic menopausal state: Secondary | ICD-10-CM | POA: Diagnosis not present

## 2021-03-25 DIAGNOSIS — C50412 Malignant neoplasm of upper-outer quadrant of left female breast: Secondary | ICD-10-CM | POA: Diagnosis not present

## 2021-03-25 DIAGNOSIS — Z09 Encounter for follow-up examination after completed treatment for conditions other than malignant neoplasm: Secondary | ICD-10-CM | POA: Diagnosis not present

## 2021-03-25 DIAGNOSIS — M8580 Other specified disorders of bone density and structure, unspecified site: Secondary | ICD-10-CM | POA: Diagnosis not present

## 2021-04-01 DIAGNOSIS — Z23 Encounter for immunization: Secondary | ICD-10-CM | POA: Diagnosis not present

## 2021-04-01 DIAGNOSIS — C50412 Malignant neoplasm of upper-outer quadrant of left female breast: Secondary | ICD-10-CM | POA: Diagnosis not present

## 2021-04-01 DIAGNOSIS — Z170 Estrogen receptor positive status [ER+]: Secondary | ICD-10-CM | POA: Diagnosis not present

## 2021-04-29 DIAGNOSIS — R922 Inconclusive mammogram: Secondary | ICD-10-CM | POA: Diagnosis not present

## 2021-04-29 DIAGNOSIS — C50412 Malignant neoplasm of upper-outer quadrant of left female breast: Secondary | ICD-10-CM | POA: Diagnosis not present

## 2021-05-27 DIAGNOSIS — Z6827 Body mass index (BMI) 27.0-27.9, adult: Secondary | ICD-10-CM | POA: Diagnosis not present

## 2021-05-27 DIAGNOSIS — R5383 Other fatigue: Secondary | ICD-10-CM | POA: Diagnosis not present

## 2021-05-27 DIAGNOSIS — I10 Essential (primary) hypertension: Secondary | ICD-10-CM | POA: Diagnosis not present

## 2021-05-27 DIAGNOSIS — Z1339 Encounter for screening examination for other mental health and behavioral disorders: Secondary | ICD-10-CM | POA: Diagnosis not present

## 2021-05-27 DIAGNOSIS — Z7189 Other specified counseling: Secondary | ICD-10-CM | POA: Diagnosis not present

## 2021-05-27 DIAGNOSIS — Z0000 Encounter for general adult medical examination without abnormal findings: Secondary | ICD-10-CM | POA: Diagnosis not present

## 2021-05-27 DIAGNOSIS — E7800 Pure hypercholesterolemia, unspecified: Secondary | ICD-10-CM | POA: Diagnosis not present

## 2021-05-27 DIAGNOSIS — Z299 Encounter for prophylactic measures, unspecified: Secondary | ICD-10-CM | POA: Diagnosis not present

## 2021-05-27 DIAGNOSIS — Z789 Other specified health status: Secondary | ICD-10-CM | POA: Diagnosis not present

## 2021-05-27 DIAGNOSIS — Z1331 Encounter for screening for depression: Secondary | ICD-10-CM | POA: Diagnosis not present

## 2021-05-27 DIAGNOSIS — Z2821 Immunization not carried out because of patient refusal: Secondary | ICD-10-CM | POA: Diagnosis not present

## 2021-05-27 DIAGNOSIS — Z79899 Other long term (current) drug therapy: Secondary | ICD-10-CM | POA: Diagnosis not present

## 2021-05-28 DIAGNOSIS — E7800 Pure hypercholesterolemia, unspecified: Secondary | ICD-10-CM | POA: Diagnosis not present

## 2021-05-28 DIAGNOSIS — Z79899 Other long term (current) drug therapy: Secondary | ICD-10-CM | POA: Diagnosis not present

## 2021-05-28 DIAGNOSIS — R5383 Other fatigue: Secondary | ICD-10-CM | POA: Diagnosis not present

## 2021-07-07 DIAGNOSIS — R634 Abnormal weight loss: Secondary | ICD-10-CM | POA: Diagnosis not present

## 2021-07-07 DIAGNOSIS — K921 Melena: Secondary | ICD-10-CM | POA: Diagnosis not present

## 2021-07-07 DIAGNOSIS — R1031 Right lower quadrant pain: Secondary | ICD-10-CM | POA: Diagnosis not present

## 2021-07-07 DIAGNOSIS — R194 Change in bowel habit: Secondary | ICD-10-CM | POA: Diagnosis not present

## 2021-07-07 DIAGNOSIS — R109 Unspecified abdominal pain: Secondary | ICD-10-CM | POA: Diagnosis not present

## 2021-07-08 DIAGNOSIS — R109 Unspecified abdominal pain: Secondary | ICD-10-CM | POA: Diagnosis not present

## 2021-07-08 DIAGNOSIS — I700 Atherosclerosis of aorta: Secondary | ICD-10-CM | POA: Diagnosis not present

## 2021-07-15 DIAGNOSIS — F419 Anxiety disorder, unspecified: Secondary | ICD-10-CM | POA: Diagnosis not present

## 2021-07-15 DIAGNOSIS — I499 Cardiac arrhythmia, unspecified: Secondary | ICD-10-CM | POA: Diagnosis not present

## 2021-07-15 DIAGNOSIS — I10 Essential (primary) hypertension: Secondary | ICD-10-CM | POA: Diagnosis not present

## 2021-07-15 DIAGNOSIS — Z79899 Other long term (current) drug therapy: Secondary | ICD-10-CM | POA: Diagnosis not present

## 2021-07-15 DIAGNOSIS — K259 Gastric ulcer, unspecified as acute or chronic, without hemorrhage or perforation: Secondary | ICD-10-CM | POA: Diagnosis not present

## 2021-07-15 DIAGNOSIS — K635 Polyp of colon: Secondary | ICD-10-CM | POA: Diagnosis not present

## 2021-07-15 DIAGNOSIS — M797 Fibromyalgia: Secondary | ICD-10-CM | POA: Diagnosis not present

## 2021-07-15 DIAGNOSIS — R634 Abnormal weight loss: Secondary | ICD-10-CM | POA: Diagnosis not present

## 2021-07-15 DIAGNOSIS — M1990 Unspecified osteoarthritis, unspecified site: Secondary | ICD-10-CM | POA: Diagnosis not present

## 2021-07-15 DIAGNOSIS — K2970 Gastritis, unspecified, without bleeding: Secondary | ICD-10-CM | POA: Diagnosis not present

## 2021-07-15 DIAGNOSIS — F32A Depression, unspecified: Secondary | ICD-10-CM | POA: Diagnosis not present

## 2021-07-15 DIAGNOSIS — D122 Benign neoplasm of ascending colon: Secondary | ICD-10-CM | POA: Diagnosis not present

## 2021-07-15 DIAGNOSIS — K3189 Other diseases of stomach and duodenum: Secondary | ICD-10-CM | POA: Diagnosis not present

## 2021-07-15 DIAGNOSIS — K648 Other hemorrhoids: Secondary | ICD-10-CM | POA: Diagnosis not present

## 2021-07-15 DIAGNOSIS — R194 Change in bowel habit: Secondary | ICD-10-CM | POA: Diagnosis not present

## 2021-07-15 DIAGNOSIS — Z853 Personal history of malignant neoplasm of breast: Secondary | ICD-10-CM | POA: Diagnosis not present

## 2021-07-15 DIAGNOSIS — R109 Unspecified abdominal pain: Secondary | ICD-10-CM | POA: Diagnosis not present

## 2021-07-15 DIAGNOSIS — I700 Atherosclerosis of aorta: Secondary | ICD-10-CM | POA: Diagnosis not present

## 2021-07-15 DIAGNOSIS — K921 Melena: Secondary | ICD-10-CM | POA: Diagnosis not present

## 2021-07-15 DIAGNOSIS — K317 Polyp of stomach and duodenum: Secondary | ICD-10-CM | POA: Diagnosis not present

## 2021-07-15 DIAGNOSIS — M5416 Radiculopathy, lumbar region: Secondary | ICD-10-CM | POA: Diagnosis not present

## 2021-07-20 ENCOUNTER — Encounter (INDEPENDENT_AMBULATORY_CARE_PROVIDER_SITE_OTHER): Payer: Self-pay | Admitting: *Deleted

## 2021-07-28 DIAGNOSIS — D122 Benign neoplasm of ascending colon: Secondary | ICD-10-CM | POA: Diagnosis not present

## 2021-07-30 ENCOUNTER — Other Ambulatory Visit: Payer: Self-pay

## 2021-07-30 ENCOUNTER — Ambulatory Visit (INDEPENDENT_AMBULATORY_CARE_PROVIDER_SITE_OTHER): Payer: Medicare Other | Admitting: Gastroenterology

## 2021-07-30 ENCOUNTER — Encounter (INDEPENDENT_AMBULATORY_CARE_PROVIDER_SITE_OTHER): Payer: Self-pay | Admitting: Gastroenterology

## 2021-07-30 DIAGNOSIS — R1031 Right lower quadrant pain: Secondary | ICD-10-CM

## 2021-07-30 DIAGNOSIS — R198 Other specified symptoms and signs involving the digestive system and abdomen: Secondary | ICD-10-CM | POA: Diagnosis not present

## 2021-07-30 DIAGNOSIS — K529 Noninfective gastroenteritis and colitis, unspecified: Secondary | ICD-10-CM

## 2021-07-30 DIAGNOSIS — G8929 Other chronic pain: Secondary | ICD-10-CM

## 2021-07-30 MED ORDER — DICYCLOMINE HCL 10 MG PO CAPS: 10.0000 mg | ORAL_CAPSULE | Freq: Two times a day (BID) | ORAL | 0 refills | Status: AC | PRN

## 2021-07-30 NOTE — Progress Notes (Signed)
Jacqueline Christian, M.D. Gastroenterology & Hepatology [facility] [address] [place], [place] [address] Primary Care Physician: Jacqueline Chroman, MD [address] [place] [address]  Referring MD: Jacqueline Gene, MD Jacqueline Mings, MD  Chief Complaint: Abnormal findings on EGD, diarrhea and abdominal pain  History of Present Illness: Jacqueline Christian is a 76 y.o. female with PMH breast cancer s/p chemoradiation and lumpectomy, depression, brain tumor s/p resection, who presents for evaluation of abnormal findings on EGD, diarrhea and abdominal pain.  Patient reports that for close to 6 months she presented recurrent episodes of diarrhea. She states she has 5-6 watery non bloody bowel movements per day for which she takes Imodium (she believes it helps her improve her BM frequency. She states that her stools were very black in the past but they have normalized in color. Has had scant amount of fecal soiling. She states having normal bowel movement frequency in the past, used to have 1 BM per day.   Also reports that she has presented right sided abdominal pain describes as burning sensation that radiates to her back. She reports it has been recently feeling better but still has some pain. She reports she was taking ibuprofen for abdominal pain but Dr. Adelina Christian recommended her to stop it. Has had a decline in her weight by 30 lb for the same amount of time she had the symptoms.  The patient was seen by Dr. Dellie Burns at [facility] for evaluation of her current symptoms.  She underwent a CT of the abdomen and pelvis with IV contrast (I cannot reviewed these images at the moment but only the report) which showed aortic atherosclerosis but no other findings in the abdomen and pelvis.  Continues she underwent an EGD and a colonoscopy on 07/15/2021.  Esophagogastroduodenospy showed presence of scattered mild inflammation characterized by linear  erosions in the prepyloric region of the stomach.  Biopsies were taken but the report does not mention the findings for this bottle.  Also had multiple 2 mm polyps in the gastric body which were removed with a cold forceps, which showed early fundic gland polyps without intestinal metaplasia.  There was a localized millimeter erosion versus erythematous area in the cardia and a second smaller area of erythema in the body.  Duodenum was normal but there was an area of nodular mucosa.  Colonoscopy showed 2 semipedunculated polyps between 3 to 6 mm in the ascending colon.  One was removed with a hot snare and the second with a cold forceps (pathology showed tubular adenoma), a localized area of erythematous mucosa was present in the transverse colon (biopsy showed benign colonic mucosa with focal changes hyperplastic changes), a 6 mm polyp at 50 cm from the anus which was removed with a cold forceps (hyperplastic polyp).  The patient was referred for evaluation of duodenal nodularity.  She also states her appetite has significantly worsened and has lost   The patient denies having any nausea, vomiting, fever, hematochezia, hematemesis, abdominal distention, jaundice, pruritus.  Last EGD:as above Last Colonoscopy:as above  FHx: neg for any gastrointestinal/liver disease, mother colon cancer in her late 54s, grandparent colon cancer Social: neg smoking, alcohol or illicit drug use Surgical: no abdominal surgeries  Past Medical History: Past Medical History:  Diagnosis Date   Arthritis    Bladder spasms    takes oxybutinin   Breast cancer of upper-outer quadrant of left female breast ([place]) 06/12/2018   Depression  situational   Dizziness    Headache(784.0)    Hypertension    Memory difficulties 12/31/2014   Neoplasm of brain ([place])    Pneumonia    hx   Recurrent sinus infections    just finished erythromycin dosing 06/18/11   Rheumatoid arthritis ([place])    UTI (lower urinary tract infection)     Vision changes    r/t brain tumor    Past Surgical History: Past Surgical History:  Procedure Laterality Date   ABDOMINAL HYSTERECTOMY     APPENDECTOMY     BREAST LUMPECTOMY WITH RADIOACTIVE SEED AND SENTINEL LYMPH NODE BIOPSY Left 06/12/2018   Procedure: LEFT BREAST LUMPECTOMY WITH RADIOACTIVE SEED AND LEFT AXILLARY DEEP SENTINEL LYMPH NODE BIOPSY INJECT BLUE DYE LEFT BREAST;  Surgeon: Fanny Skates, MD;  Location: [place];  Service: General;  Laterality: Left;   CRANIOTOMY  06/22/2011   Procedure: CRANIOTOMY TUMOR EXCISION;  Surgeon: Peggyann Shoals, MD;  Location: [place] [facility];  Service: Neurosurgery;  Laterality: Right;  RIGHT pterional Craniotomy for meningioma   CRANIOTOMY  06/22/2011   Procedure: CRANIOTOMY HEMATOMA EVACUATION SUBDURAL;  Surgeon: Peggyann Shoals, MD;  Location: [place] [facility];  Service: Neurosurgery;  Laterality: Right;   CYSTECTOMY     L wrist   EYE SURGERY  1977   retina repair   PORTACATH PLACEMENT Right 06/12/2018   Procedure: INSERTION PORT-A-CATH;  Surgeon: Fanny Skates, MD;  Location: [place];  Service: General;  Laterality: Right;   STAPEDES SURGERY     repalced stapes   TOTAL KNEE ARTHROPLASTY     R   TOTAL KNEE ARTHROPLASTY Left 03/05/2014   Procedure: TOTAL KNEE ARTHROPLASTY;  Surgeon: Garald Balding, MD;  Location: [place];  Service: Orthopedics;  Laterality: Left;    Family History: Family History  Problem Relation Age of Onset   Colon cancer Mother    Stroke Father    Healthy Brother    Cancer - Lung Sister    Heart attack Paternal Aunt    Heart attack Paternal Uncle    Heart attack Paternal Grandmother    Heart attack Paternal Grandfather     Social History: Social History   Tobacco Use  Smoking Status Never  Smokeless Tobacco Never   Social History   Substance and Sexual Activity  Alcohol Use No   Social History   Substance and Sexual Activity  Drug Use No    Allergies: Allergies   Allergen Reactions   Morphine And Related Other (See Comments)    Pt says it felt like her head was coming off.    Medications: Current Outpatient Medications  Medication Sig Dispense Refill   cetirizine (ZYRTEC) 10 MG tablet Take 10 mg by mouth daily as needed for allergies.      dicyclomine (BENTYL) 10 MG capsule Take 1 capsule (10 mg total) by mouth every 12 (twelve) hours as needed (abdominal pain). 60 capsule 0   DULoxetine (CYMBALTA) 60 MG capsule Take 60 mg by mouth 2 (two) times daily.     Multiple Vitamins-Minerals (MULTIVITAMIN PO) Take 1 tablet by mouth daily.     oxybutynin (DITROPAN) 5 MG tablet Take 5 mg by mouth 2 (two) times daily.     pantoprazole (PROTONIX) 40 MG tablet Take 40 mg by mouth daily.     triamterene-hydrochlorothiazide (MAXZIDE-25) 37.5-25 MG per tablet Take 1 tablet by mouth daily.     No current facility-administered medications for this visit.    Review of  Systems: GENERAL: negative for malaise, night sweats HEENT: No changes in hearing or vision, no nose bleeds or other nasal problems. NECK: Negative for lumps, goiter, pain and significant neck swelling RESPIRATORY: Negative for cough, wheezing CARDIOVASCULAR: Negative for chest pain, leg swelling, palpitations, orthopnea GI: SEE HPI MUSCULOSKELETAL: Negative for joint pain or swelling, back pain, and muscle pain. SKIN: Negative for lesions, rash PSYCH: Negative for sleep disturbance, mood disorder and recent psychosocial stressors. HEMATOLOGY Negative for prolonged bleeding, bruising easily, and swollen nodes. ENDOCRINE: Negative for cold or heat intolerance, polyuria, polydipsia and goiter. NEURO: negative for tremor, gait imbalance, syncope and seizures. The remainder of the review of systems is noncontributory.   Physical Exam: BP 125/65 (BP Location: Left Arm, Patient Position: Sitting, Cuff Size: Small)    Pulse 70    Temp 99.4 F (37.4 C) (Oral)    Ht 5\' 3"  (1.6 m)    Wt 145 lb 1.6 oz  (65.8 kg)    BMI 25.70 kg/m  GENERAL: The patient is AO x3, in no acute distress. HEENT: Head is normocephalic and atraumatic. EOMI are intact. Mouth is well hydrated and without lesions. NECK: Supple. No masses LUNGS: Clear to auscultation. No presence of rhonchi/wheezing/rales. Adequate chest expansion HEART: RRR, normal s1 and s2. ABDOMEN: Soft, nontender, no guarding, no peritoneal signs, and nondistended. BS +. No masses. EXTREMITIES: Without any cyanosis, clubbing, rash, lesions or edema. NEUROLOGIC: AOx3, no focal motor deficit. SKIN: no jaundice, no rashes   Imaging/Labs: as above  I personally reviewed and interpreted the available labs, imaging and endoscopic files.  Impression and Plan: LYANA ASBILL is a 76 y.o. female with PMH breast cancer s/p chemoradiation and lumpectomy, depression, brain tumor s/p resection, who presents for evaluation of abnormal findings on EGD, diarrhea and abdominal pain.  The patient has presented chronic symptoms of abdominal pain and diarrhea of unclear etiology.  She reports that her symptoms started for the last 6 months and have fluctuating severity but she has presented significant weight loss.  She had endoscopic procedures performed recently which showed unclear findings, especially in the upper endoscopy.  I discussed with the patient that we should repeat both the EGD and colonoscopy to evaluate the nodularity areas found in most recent EGD which were not biopsied, but also it would be important to perform random colonic biopsies during the colonoscopy as she could have microscopic colitis as a cause of her persistent diarrhea.  We will also look for infectious causes as these have not been evaluated recently.  She will benefit from taking Bentyl as needed to improve her abdominal pain episodes.  Fortunately, her cross-sectional abdominal imaging was unremarkable.  - Schedule EGD and colonoscopy -Check GI pathogen panel and C. difficile  testing - Start Bentyl 1 tablet q12h as needed for abdominal pain  All questions were answered.      Jacqueline Peppers, MD Gastroenterology and Hepatology [facility]

## 2021-07-30 NOTE — Patient Instructions (Addendum)
Schedule EGD and colonoscopy Perform stool workup Start Bentyl 1 tablet q12h as needed for abdominal pain

## 2021-07-30 NOTE — H&P (View-Only) (Signed)
Maylon Peppers, M.D. Gastroenterology & Hepatology [facility] [address] [place], [place] [address] Primary Care Physician: Glenda Chroman, MD [address] [place] [address]  Referring MD: Ladoris Gene, MD Adelina Mings, MD  Chief Complaint: Abnormal findings on EGD, diarrhea and abdominal pain  History of Present Illness: Jacqueline Christian is a 76 y.o. female with PMH breast cancer s/p chemoradiation and lumpectomy, depression, brain tumor s/p resection, who presents for evaluation of abnormal findings on EGD, diarrhea and abdominal pain.  Patient reports that for close to 6 months she presented recurrent episodes of diarrhea. She states she has 5-6 watery non bloody bowel movements per day for which she takes Imodium (she believes it helps her improve her BM frequency. She states that her stools were very black in the past but they have normalized in color. Has had scant amount of fecal soiling. She states having normal bowel movement frequency in the past, used to have 1 BM per day.   Also reports that she has presented right sided abdominal pain describes as burning sensation that radiates to her back. She reports it has been recently feeling better but still has some pain. She reports she was taking ibuprofen for abdominal pain but Dr. Adelina Mings recommended her to stop it. Has had a decline in her weight by 30 lb for the same amount of time she had the symptoms.  The patient was seen by Dr. Dellie Burns at [facility] for evaluation of her current symptoms.  She underwent a CT of the abdomen and pelvis with IV contrast (I cannot reviewed these images at the moment but only the report) which showed aortic atherosclerosis but no other findings in the abdomen and pelvis.  Continues she underwent an EGD and a colonoscopy on 07/15/2021.  Esophagogastroduodenospy showed presence of scattered mild inflammation characterized by linear  erosions in the prepyloric region of the stomach.  Biopsies were taken but the report does not mention the findings for this bottle.  Also had multiple 2 mm polyps in the gastric body which were removed with a cold forceps, which showed early fundic gland polyps without intestinal metaplasia.  There was a localized millimeter erosion versus erythematous area in the cardia and a second smaller area of erythema in the body.  Duodenum was normal but there was an area of nodular mucosa.  Colonoscopy showed 2 semipedunculated polyps between 3 to 6 mm in the ascending colon.  One was removed with a hot snare and the second with a cold forceps (pathology showed tubular adenoma), a localized area of erythematous mucosa was present in the transverse colon (biopsy showed benign colonic mucosa with focal changes hyperplastic changes), a 6 mm polyp at 50 cm from the anus which was removed with a cold forceps (hyperplastic polyp).  The patient was referred for evaluation of duodenal nodularity.  She also states her appetite has significantly worsened and has lost   The patient denies having any nausea, vomiting, fever, hematochezia, hematemesis, abdominal distention, jaundice, pruritus.  Last EGD:as above Last Colonoscopy:as above  FHx: neg for any gastrointestinal/liver disease, mother colon cancer in her late 31s, grandparent colon cancer Social: neg smoking, alcohol or illicit drug use Surgical: no abdominal surgeries  Past Medical History: Past Medical History:  Diagnosis Date   Arthritis    Bladder spasms    takes oxybutinin   Breast cancer of upper-outer quadrant of left female breast ([place]) 06/12/2018   Depression  situational   Dizziness    Headache(784.0)    Hypertension    Memory difficulties 12/31/2014   Neoplasm of brain ([place])    Pneumonia    hx   Recurrent sinus infections    just finished erythromycin dosing 06/18/11   Rheumatoid arthritis ([place])    UTI (lower urinary tract infection)     Vision changes    r/t brain tumor    Past Surgical History: Past Surgical History:  Procedure Laterality Date   ABDOMINAL HYSTERECTOMY     APPENDECTOMY     BREAST LUMPECTOMY WITH RADIOACTIVE SEED AND SENTINEL LYMPH NODE BIOPSY Left 06/12/2018   Procedure: LEFT BREAST LUMPECTOMY WITH RADIOACTIVE SEED AND LEFT AXILLARY DEEP SENTINEL LYMPH NODE BIOPSY INJECT BLUE DYE LEFT BREAST;  Surgeon: Fanny Skates, MD;  Location: [place];  Service: General;  Laterality: Left;   CRANIOTOMY  06/22/2011   Procedure: CRANIOTOMY TUMOR EXCISION;  Surgeon: Peggyann Shoals, MD;  Location: [place] [facility];  Service: Neurosurgery;  Laterality: Right;  RIGHT pterional Craniotomy for meningioma   CRANIOTOMY  06/22/2011   Procedure: CRANIOTOMY HEMATOMA EVACUATION SUBDURAL;  Surgeon: Peggyann Shoals, MD;  Location: [place] [facility];  Service: Neurosurgery;  Laterality: Right;   CYSTECTOMY     L wrist   EYE SURGERY  1977   retina repair   PORTACATH PLACEMENT Right 06/12/2018   Procedure: INSERTION PORT-A-CATH;  Surgeon: Fanny Skates, MD;  Location: [place];  Service: General;  Laterality: Right;   STAPEDES SURGERY     repalced stapes   TOTAL KNEE ARTHROPLASTY     R   TOTAL KNEE ARTHROPLASTY Left 03/05/2014   Procedure: TOTAL KNEE ARTHROPLASTY;  Surgeon: Garald Balding, MD;  Location: [place];  Service: Orthopedics;  Laterality: Left;    Family History: Family History  Problem Relation Age of Onset   Colon cancer Mother    Stroke Father    Healthy Brother    Cancer - Lung Sister    Heart attack Paternal Aunt    Heart attack Paternal Uncle    Heart attack Paternal Grandmother    Heart attack Paternal Grandfather     Social History: Social History   Tobacco Use  Smoking Status Never  Smokeless Tobacco Never   Social History   Substance and Sexual Activity  Alcohol Use No   Social History   Substance and Sexual Activity  Drug Use No    Allergies: Allergies   Allergen Reactions   Morphine And Related Other (See Comments)    Pt says it felt like her head was coming off.    Medications: Current Outpatient Medications  Medication Sig Dispense Refill   cetirizine (ZYRTEC) 10 MG tablet Take 10 mg by mouth daily as needed for allergies.      dicyclomine (BENTYL) 10 MG capsule Take 1 capsule (10 mg total) by mouth every 12 (twelve) hours as needed (abdominal pain). 60 capsule 0   DULoxetine (CYMBALTA) 60 MG capsule Take 60 mg by mouth 2 (two) times daily.     Multiple Vitamins-Minerals (MULTIVITAMIN PO) Take 1 tablet by mouth daily.     oxybutynin (DITROPAN) 5 MG tablet Take 5 mg by mouth 2 (two) times daily.     pantoprazole (PROTONIX) 40 MG tablet Take 40 mg by mouth daily.     triamterene-hydrochlorothiazide (MAXZIDE-25) 37.5-25 MG per tablet Take 1 tablet by mouth daily.     No current facility-administered medications for this visit.    Review of  Systems: GENERAL: negative for malaise, night sweats HEENT: No changes in hearing or vision, no nose bleeds or other nasal problems. NECK: Negative for lumps, goiter, pain and significant neck swelling RESPIRATORY: Negative for cough, wheezing CARDIOVASCULAR: Negative for chest pain, leg swelling, palpitations, orthopnea GI: SEE HPI MUSCULOSKELETAL: Negative for joint pain or swelling, back pain, and muscle pain. SKIN: Negative for lesions, rash PSYCH: Negative for sleep disturbance, mood disorder and recent psychosocial stressors. HEMATOLOGY Negative for prolonged bleeding, bruising easily, and swollen nodes. ENDOCRINE: Negative for cold or heat intolerance, polyuria, polydipsia and goiter. NEURO: negative for tremor, gait imbalance, syncope and seizures. The remainder of the review of systems is noncontributory.   Physical Exam: BP 125/65 (BP Location: Left Arm, Patient Position: Sitting, Cuff Size: Small)    Pulse 70    Temp 99.4 F (37.4 C) (Oral)    Ht 5\' 3"  (1.6 m)    Wt 145 lb 1.6 oz  (65.8 kg)    BMI 25.70 kg/m  GENERAL: The patient is AO x3, in no acute distress. HEENT: Head is normocephalic and atraumatic. EOMI are intact. Mouth is well hydrated and without lesions. NECK: Supple. No masses LUNGS: Clear to auscultation. No presence of rhonchi/wheezing/rales. Adequate chest expansion HEART: RRR, normal s1 and s2. ABDOMEN: Soft, nontender, no guarding, no peritoneal signs, and nondistended. BS +. No masses. EXTREMITIES: Without any cyanosis, clubbing, rash, lesions or edema. NEUROLOGIC: AOx3, no focal motor deficit. SKIN: no jaundice, no rashes   Imaging/Labs: as above  I personally reviewed and interpreted the available labs, imaging and endoscopic files.  Impression and Plan: Jacqueline Christian is a 76 y.o. female with PMH breast cancer s/p chemoradiation and lumpectomy, depression, brain tumor s/p resection, who presents for evaluation of abnormal findings on EGD, diarrhea and abdominal pain.  The patient has presented chronic symptoms of abdominal pain and diarrhea of unclear etiology.  She reports that her symptoms started for the last 6 months and have fluctuating severity but she has presented significant weight loss.  She had endoscopic procedures performed recently which showed unclear findings, especially in the upper endoscopy.  I discussed with the patient that we should repeat both the EGD and colonoscopy to evaluate the nodularity areas found in most recent EGD which were not biopsied, but also it would be important to perform random colonic biopsies during the colonoscopy as she could have microscopic colitis as a cause of her persistent diarrhea.  We will also look for infectious causes as these have not been evaluated recently.  She will benefit from taking Bentyl as needed to improve her abdominal pain episodes.  Fortunately, her cross-sectional abdominal imaging was unremarkable.  - Schedule EGD and colonoscopy -Check GI pathogen panel and C. difficile  testing - Start Bentyl 1 tablet q12h as needed for abdominal pain  All questions were answered.      Maylon Peppers, MD Gastroenterology and Hepatology [facility]

## 2021-07-31 ENCOUNTER — Telehealth (INDEPENDENT_AMBULATORY_CARE_PROVIDER_SITE_OTHER): Payer: Self-pay

## 2021-07-31 NOTE — Telephone Encounter (Signed)
Jacqueline Christian the patient only wants EGD done not the TCS. ( Paper given to you at patient visit on 07/30/2021).

## 2021-07-31 NOTE — Telephone Encounter (Signed)
That's not correct, the report she brought states she had biopsies from a focal erythematous area. No random biopsies were performed. OK, she can proceed with only the EGD. Unfortunately the biopsies of the colon may be needed for evaluation of her diarrhea.

## 2021-07-31 NOTE — Telephone Encounter (Signed)
Patient was seen 07/30/2021 here by Dr. Jenetta Downer. She states he has ordered another TCS and she just had one done at [facility] by [place] on 07/15/2021. She would prefer not to have another one unless absolutely necessary, wanted to know why Dr.Castaneda felt she needed anonther so soon.She states she does not think her insurance will pay for another so soon after also. Please advise.

## 2021-07-31 NOTE — Telephone Encounter (Signed)
Patient states they did take random biopsies. She does not not want to have the TCS done only the egd.

## 2021-07-31 NOTE — Telephone Encounter (Signed)
Would be part of the evaluation of her diarrhea and abdominal pain (no random biopsies were taken). I would recommend doing it for those purposes.

## 2021-08-03 ENCOUNTER — Other Ambulatory Visit (INDEPENDENT_AMBULATORY_CARE_PROVIDER_SITE_OTHER): Payer: Self-pay

## 2021-08-03 DIAGNOSIS — I10 Essential (primary) hypertension: Secondary | ICD-10-CM

## 2021-08-04 ENCOUNTER — Encounter (INDEPENDENT_AMBULATORY_CARE_PROVIDER_SITE_OTHER): Payer: Self-pay

## 2021-08-04 ENCOUNTER — Other Ambulatory Visit (INDEPENDENT_AMBULATORY_CARE_PROVIDER_SITE_OTHER): Payer: Self-pay

## 2021-08-05 NOTE — Patient Instructions (Signed)
? ? ? ? ? ? Jacqueline Christian ? 08/05/2021  ?  ? @PREFPERIOPPHARMACY @ ? ? Your procedure is scheduled on  08/11/2021. ? ? Report to [facility] at  1145  A.M. ? ? Call this number if you have problems the morning of surgery: ? [phone] ? ? Remember: ? Follow the diet instructions given to you by the office. ?  ? Take these medicines the morning of surgery with A SIP OF WATER  ? ?                   celexa, cymbalta, ditropan, protonix. ?  ? ? Do not wear jewelry, make-up or nail polish. ? Do not wear lotions, powders, or perfumes, or deodorant. ? Do not shave 48 hours prior to surgery.  Men may shave face and neck. ? Do not bring valuables to the hospital. ? [facility] is not responsible for any belongings or valuables. ? ?Contacts, dentures or bridgework may not be worn into surgery.  Leave your suitcase in the car.  After surgery it may be brought to your room. ? ?For patients admitted to the hospital, discharge time will be determined by your treatment team. ? ?Patients discharged the day of surgery will not be allowed to drive home and must have someone with them for 24 hours.  ? ? ?Special instructions:   DO NOT smoke tobacco or vape for 24 hours before your procedure. ? ?Please read over the following fact sheets that you were given. ?Anesthesia Post-op Instructions and Care and Recovery After Surgery ?  ? ? ? Upper Endoscopy, Adult, Care After ?This sheet gives you information about how to care for yourself after your procedure. Your health care provider may also give you more specific instructions. If you have problems or questions, contact your health care provider. ?What can I expect after the procedure? ?After the procedure, it is common to have: ?A sore throat. ?Mild stomach pain or discomfort. ?Bloating. ?Nausea. ?Follow these instructions at home: ? ?Follow instructions from your health care provider about what to eat or drink after your procedure. ?Return to your normal activities as told by your health  care provider. Ask your health care provider what activities are safe for you. ?Take over-the-counter and prescription medicines only as told by your health care provider. ?If you were given a sedative during the procedure, it can affect you for several hours. Do not drive or operate machinery until your health care provider says that it is safe. ?Keep all follow-up visits as told by your health care provider. This is important. ?Contact a health care provider if you have: ?A sore throat that lasts longer than one day. ?Trouble swallowing. ?Get help right away if: ?You vomit blood or your vomit looks like coffee grounds. ?You have: ?A fever. ?Bloody, black, or tarry stools. ?A severe sore throat or you cannot swallow. ?Difficulty breathing. ?Severe pain in your chest or abdomen. ?Summary ?After the procedure, it is common to have a sore throat, mild stomach discomfort, bloating, and nausea. ?If you were given a sedative during the procedure, it can affect you for several hours. Do not drive or operate machinery until your health care provider says that it is safe. ?Follow instructions from your health care provider about what to eat or drink after your procedure. ?Return to your normal activities as told by your health care provider. ?This information is not intended to replace advice given to you by your health care provider. Make  sure you discuss any questions you have with your health care provider. ?Document Revised: 03/30/2019 Document Reviewed: 10/24/2017 ?Elsevier Patient Education ? [place]. ?Monitored Anesthesia Care, Care After ?This sheet gives you information about how to care for yourself after your procedure. Your health care provider may also give you more specific instructions. If you have problems or questions, contact your health care provider. ?What can I expect after the procedure? ?After the procedure, it is common to have: ?Tiredness. ?Forgetfulness about what happened after the  procedure. ?Impaired judgment for important decisions. ?Nausea or vomiting. ?Some difficulty with balance. ?Follow these instructions at home: ?For the time period you were told by your health care provider: ?  ?Rest as needed. ?Do not participate in activities where you could fall or become injured. ?Do not drive or use machinery. ?Do not drink alcohol. ?Do not take sleeping pills or medicines that cause drowsiness. ?Do not make important decisions or sign legal documents. ?Do not take care of children on your own. ?Eating and drinking ?Follow the diet that is recommended by your health care provider. ?Drink enough fluid to keep your urine pale yellow. ?If you vomit: ?Drink water, juice, or soup when you can drink without vomiting. ?Make sure you have little or no nausea before eating solid foods. ?General instructions ?Have a responsible adult stay with you for the time you are told. It is important to have someone help care for you until you are awake and alert. ?Take over-the-counter and prescription medicines only as told by your health care provider. ?If you have sleep apnea, surgery and certain medicines can increase your risk for breathing problems. Follow instructions from your health care provider about wearing your sleep device: ?Anytime you are sleeping, including during daytime naps. ?While taking prescription pain medicines, sleeping medicines, or medicines that make you drowsy. ?Avoid smoking. ?Keep all follow-up visits as told by your health care provider. This is important. ?Contact a health care provider if: ?You keep feeling nauseous or you keep vomiting. ?You feel light-headed. ?You are still sleepy or having trouble with balance after 24 hours. ?You develop a rash. ?You have a fever. ?You have redness or swelling around the IV site. ?Get help right away if: ?You have trouble breathing. ?You have new-onset confusion at home. ?Summary ?For several hours after your procedure, you may feel tired.  You may also be forgetful and have poor judgment. ?Have a responsible adult stay with you for the time you are told. It is important to have someone help care for you until you are awake and alert. ?Rest as told. Do not drive or operate machinery. Do not drink alcohol or take sleeping pills. ?Get help right away if you have trouble breathing, or if you suddenly become confused. ?This information is not intended to replace advice given to you by your health care provider. Make sure you discuss any questions you have with your health care provider. ?Document Revised: 02/07/2020 Document Reviewed: 04/26/2019 ?Elsevier Patient Education ? [place]. ? ?

## 2021-08-05 NOTE — Pre-Procedure Instructions (Signed)
?  RE: consent ?Received: Today ?Harvel Quale, MD  Encarnacion Chu, RN ?Good morning,  ?Yes, she is only having an EGD. Please fix the consent  ?Thanks   ?  ?   ?Previous Messages ?  ?----- Message -----  ?From: Encarnacion Chu, RN  ?Sent: 08/05/2021   9:36 AM EST  ?To: Harvel Quale, MD  ?Subject: consent                                        ? ?Good morning! I see reading the notes that [facility] just wants the EGD done and not the TCS. Is it okay if I fix the consent to reflect this?  ? ?  ?

## 2021-08-07 ENCOUNTER — Encounter (HOSPITAL_COMMUNITY)
Admission: RE | Admit: 2021-08-07 | Discharge: 2021-08-07 | Disposition: A | Discharge status: Home / Self Care | Payer: Medicare Other | Source: Ambulatory Visit | Attending: Gastroenterology | Admitting: Gastroenterology

## 2021-08-07 ENCOUNTER — Encounter (HOSPITAL_COMMUNITY): Payer: Self-pay

## 2021-08-07 DIAGNOSIS — K529 Noninfective gastroenteritis and colitis, unspecified: Secondary | ICD-10-CM | POA: Diagnosis not present

## 2021-08-07 DIAGNOSIS — R197 Diarrhea, unspecified: Secondary | ICD-10-CM | POA: Diagnosis not present

## 2021-08-07 DIAGNOSIS — I10 Essential (primary) hypertension: Secondary | ICD-10-CM

## 2021-08-07 DIAGNOSIS — Z01818 Encounter for other preprocedural examination: Secondary | ICD-10-CM | POA: Diagnosis not present

## 2021-08-07 LAB — BASIC METABOLIC PANEL
Anion gap: 10 (ref 5–15)
BUN: 22 mg/dL (ref 8–23)
CO2: 29 mmol/L (ref 22–32)
Calcium: 9.5 mg/dL (ref 8.9–10.3)
Chloride: 100 mmol/L (ref 98–111)
Creatinine, Ser: 0.82 mg/dL (ref 0.44–1.00)
GFR, Estimated: >60 mL/min (ref >60)
Glucose, Bld: 98 mg/dL (ref 70–99)
Potassium: 3.2 mmol/L — ABNORMAL LOW (ref 3.5–5.1)
Sodium: 139 mmol/L (ref 135–145)

## 2021-08-10 LAB — GASTROINTESTINAL PATHOGEN PANEL PCR
C. difficile Tox A/B, PCR: NOT DETECTED
Campylobacter, PCR: NOT DETECTED
Cryptosporidium, PCR: NOT DETECTED
E coli (ETEC) LT/ST PCR: NOT DETECTED
E coli (STEC) stx1/stx2, PCR: NOT DETECTED
E coli 0157, PCR: NOT DETECTED
Giardia lamblia, PCR: NOT DETECTED
Norovirus, PCR: NOT DETECTED
Rotavirus A, PCR: NOT DETECTED
Salmonella, PCR: NOT DETECTED
Shigella, PCR: NOT DETECTED

## 2021-08-11 ENCOUNTER — Encounter (HOSPITAL_COMMUNITY)
Admission: RE | Disposition: A | Discharge status: Home / Self Care | Payer: Self-pay | Source: Home / Self Care | Attending: Gastroenterology

## 2021-08-11 ENCOUNTER — Ambulatory Visit (HOSPITAL_COMMUNITY)
Admission: RE | Admit: 2021-08-11 | Discharge: 2021-08-11 | Disposition: A | Discharge status: Home / Self Care | Payer: Medicare Other | Attending: Gastroenterology | Admitting: Gastroenterology

## 2021-08-11 ENCOUNTER — Ambulatory Visit (HOSPITAL_COMMUNITY): Payer: Medicare Other | Admitting: Anesthesiology

## 2021-08-11 ENCOUNTER — Other Ambulatory Visit: Payer: Self-pay

## 2021-08-11 ENCOUNTER — Ambulatory Visit (HOSPITAL_BASED_OUTPATIENT_CLINIC_OR_DEPARTMENT_OTHER): Payer: Medicare Other | Admitting: Anesthesiology

## 2021-08-11 ENCOUNTER — Encounter (HOSPITAL_COMMUNITY): Payer: Self-pay | Admitting: Gastroenterology

## 2021-08-11 DIAGNOSIS — F32A Depression, unspecified: Secondary | ICD-10-CM | POA: Diagnosis not present

## 2021-08-11 DIAGNOSIS — Z853 Personal history of malignant neoplasm of breast: Secondary | ICD-10-CM | POA: Diagnosis not present

## 2021-08-11 DIAGNOSIS — I10 Essential (primary) hypertension: Secondary | ICD-10-CM

## 2021-08-11 DIAGNOSIS — K2970 Gastritis, unspecified, without bleeding: Secondary | ICD-10-CM | POA: Diagnosis not present

## 2021-08-11 DIAGNOSIS — K3189 Other diseases of stomach and duodenum: Secondary | ICD-10-CM | POA: Insufficient documentation

## 2021-08-11 DIAGNOSIS — K449 Diaphragmatic hernia without obstruction or gangrene: Secondary | ICD-10-CM

## 2021-08-11 DIAGNOSIS — K2289 Other specified disease of esophagus: Secondary | ICD-10-CM | POA: Diagnosis not present

## 2021-08-11 DIAGNOSIS — K319 Disease of stomach and duodenum, unspecified: Secondary | ICD-10-CM | POA: Diagnosis not present

## 2021-08-11 DIAGNOSIS — R109 Unspecified abdominal pain: Secondary | ICD-10-CM

## 2021-08-11 DIAGNOSIS — G709 Myoneural disorder, unspecified: Secondary | ICD-10-CM | POA: Insufficient documentation

## 2021-08-11 DIAGNOSIS — K2950 Unspecified chronic gastritis without bleeding: Secondary | ICD-10-CM | POA: Insufficient documentation

## 2021-08-11 DIAGNOSIS — Q402 Other specified congenital malformations of stomach: Secondary | ICD-10-CM | POA: Diagnosis not present

## 2021-08-11 DIAGNOSIS — K2270 Barrett's esophagus without dysplasia: Secondary | ICD-10-CM | POA: Diagnosis not present

## 2021-08-11 HISTORY — PX: BIOPSY: SHX5522

## 2021-08-11 HISTORY — PX: ESOPHAGOGASTRODUODENOSCOPY (EGD) WITH PROPOFOL: SHX5813

## 2021-08-11 SURGERY — ESOPHAGOGASTRODUODENOSCOPY (EGD) WITH PROPOFOL
Anesthesia: General

## 2021-08-11 MED ORDER — LIDOCAINE 2% (20 MG/ML) 5 ML SYRINGE
INTRAMUSCULAR | Status: DC | PRN
  Administered 2021-08-11: 50 mg via INTRAVENOUS

## 2021-08-11 MED ORDER — LACTATED RINGERS IV SOLN: INTRAVENOUS | Status: DC | PRN

## 2021-08-11 MED ORDER — PROPOFOL 10 MG/ML IV BOLUS
INTRAVENOUS | Status: DC | PRN
  Administered 2021-08-11: 20 mg via INTRAVENOUS
  Administered 2021-08-11: 60 mg via INTRAVENOUS
  Administered 2021-08-11 (×2): 20 mg via INTRAVENOUS

## 2021-08-11 MED ORDER — SODIUM CHLORIDE 0.9 % IV SOLN: INTRAVENOUS | Status: DC

## 2021-08-11 NOTE — Discharge Instructions (Signed)
You are being discharged to home.  ?Resume your previous diet.  ?We are waiting for your pathology results.  ?Continue pantoprazole 40 mg qday. ?

## 2021-08-11 NOTE — Interval H&P Note (Signed)
History and Physical Interval Note: ? ?08/11/2021 ?12:12 PM ? ?Jacqueline Christian  has presented today for surgery, with the diagnosis of Abnormal Egd.  The various methods of treatment have been discussed with the patient and family. After consideration of risks, benefits and other options for treatment, the patient has consented to  Procedure(s) with comments: ?ESOPHAGOGASTRODUODENOSCOPY (EGD) WITH PROPOFOL (N/A) - 1215 as a surgical intervention.  The patient's history has been reviewed, patient examined, no change in status, stable for surgery.  I have reviewed the patient's chart and labs.  Questions were answered to the patient's satisfaction.   ? ? ?Maylon Peppers Mayorga ? ? ?

## 2021-08-11 NOTE — Op Note (Addendum)
[facility] ?Patient Name: Jacqueline Christian ?Procedure Date: 08/11/2021 1:31 PM ?MRN: [id number] ?Date of Birth: [birth year] ?Attending MD: Maylon Peppers ,  ?CSN: [id number] ?Age: 76 ?Admit Type: Outpatient ?Procedure:                Upper GI endoscopy ?Indications:              Abdominal pain, abnormal duodenum in previous EGD ?Providers:                Maylon Peppers, Hughie Closs RN, RN, Suzan Garibaldi.  ?                          Risa Grill, Technician ?Referring MD:              ?Medicines:                Monitored Anesthesia Care ?Complications:            No immediate complications. ?Estimated Blood Loss:     Estimated blood loss: none. ?Procedure:                Pre-Anesthesia Assessment: ?                          - Prior to the procedure, a History and Physical  ?                          was performed, and patient medications, allergies  ?                          and sensitivities were reviewed. The patient's  ?                          tolerance of previous anesthesia was reviewed. ?                          - The risks and benefits of the procedure and the  ?                          sedation options and risks were discussed with the  ?                          patient. All questions were answered and informed  ?                          consent was obtained. ?                          - ASA Grade Assessment: II - A patient with mild  ?                          systemic disease. ?                          After obtaining informed consent, the endoscope was  ?                          passed under direct  vision. Throughout the  ?                          procedure, the patient's blood pressure, pulse, and  ?                          oxygen saturations were monitored continuously. The  ?                          GIF-H190 ([id number]) scope was introduced through the  ?                          mouth, and advanced to the second part of duodenum.  ?                          The upper GI endoscopy was  accomplished without  ?                          difficulty. The patient tolerated the procedure  ?                          well. ?Scope In: 1:44:28 PM ?Scope Out: 1:54:34 PM ?Total Procedure Duration: 0 hours 10 minutes 6 seconds  ?Findings: ?     Islands of salmon-colored mucosa were present from 37 to 38 cm. No other  ?     visible abnormalities were present. Biopsies were taken with a cold  ?     forceps for histology. ?     Localized mild inflammation characterized by erythema was found in the  ?     gastric body and in the gastric antrum. Biopsies were taken with a cold  ?     forceps for Helicobacter pylori testing. ?     Localized nodular mucosa was found in the duodenal bulb, likely related  ?     to [facility] gland hyperplasia. Biopsies were taken with a cold forceps  ?     for histology. ?     The exam of the duodenum was otherwise normal. ?Impression:               - Salmon-colored mucosa suspicious for  ?                          short-segment Barrett's esophagus. Biopsied. ?                          - Gastritis. Biopsied. ?                          - Nodular mucosa in the duodenal bulb, likely  ?                          related to [facility] gland hyperplasia. Biopsied. ?Moderate Sedation: ?     Per Anesthesia Care ?Recommendation:           - Discharge patient to home (ambulatory). ?                          - Resume previous  diet. ?                          - Await pathology results. ?                          - Continue pantoprazole 40 mg qday. ?Procedure Code(s):        --- Professional --- ?                          [phone], Esophagogastroduodenoscopy, flexible,  ?                          transoral; with biopsy, single or multiple ?Diagnosis Code(s):        --- Professional --- ?                          K22.8, Other specified diseases of esophagus ?                          K29.70, Gastritis, unspecified, without bleeding ?                          K31.89, Other diseases of stomach and duodenum ?                           R10.9, Unspecified abdominal pain ?CPT copyright 2019 American Medical Association. All rights reserved. ?The codes documented in this report are preliminary and upon coder review may  ?be revised to meet current compliance requirements. ?Maylon Peppers, MD ?Maylon Peppers,  ?08/11/2021 2:00:53 PM ?This report has been signed electronically. ?Number of Addenda: 0 ?

## 2021-08-11 NOTE — Transfer of Care (Signed)
Immediate Anesthesia Transfer of Care Note ? ?Patient: Jacqueline Christian ? ?Procedure(s) Performed: ESOPHAGOGASTRODUODENOSCOPY (EGD) WITH PROPOFOL ?BIOPSY ? ?Patient Location: Short Stay ? ?Anesthesia Type:General ? ?Level of Consciousness: awake and alert  ? ?Airway & Oxygen Therapy: Patient Spontanous Breathing ? ?Post-op Assessment: Report given to RN and Post -op Vital signs reviewed and stable ? ?Post vital signs: Reviewed and stable ? ?Last Vitals:  ?Vitals Value Taken Time  ?BP 114/62 08/11/21 1359  ?Temp    ?Pulse 63 08/11/21 1359  ?Resp 16 08/11/21 1359  ?SpO2 98 % 08/11/21 1359  ? ? ?Last Pain:  ?Vitals:  ? 08/11/21 1359  ?PainSc: 0-No pain  ?   ? ?  ? ?Complications: No notable events documented. ?

## 2021-08-11 NOTE — Interval H&P Note (Signed)
History and Physical Interval Note: ? ?08/11/2021 ?12:45 PM ? ?Jacqueline Christian  has presented today for surgery, with the diagnosis of Abnormal Egd.  The various methods of treatment have been discussed with the patient and family. After consideration of risks, benefits and other options for treatment, the patient has consented to  Procedure(s) with comments: ?ESOPHAGOGASTRODUODENOSCOPY (EGD) WITH PROPOFOL (N/A) - 1215 as a surgical intervention.  The patient's history has been reviewed, patient examined, no change in status, stable for surgery.  I have reviewed the patient's chart and labs.  Questions were answered to the patient's satisfaction.   ? ? ?Maylon Peppers Mayorga ? ? ?

## 2021-08-11 NOTE — Anesthesia Preprocedure Evaluation (Signed)
Anesthesia Evaluation  ?Patient identified by MRN, date of birth, ID band ?Patient awake ? ? ? ?Reviewed: ?Allergy & Precautions, H&P , NPO status , Patient's Chart, lab work & pertinent test results, reviewed documented beta blocker date and time  ? ?Airway ?Mallampati: II ? ?TM Distance: >3 FB ?Neck ROM: full ? ? ? Dental ?no notable dental hx. ? ?  ?Pulmonary ?neg pulmonary ROS,  ?  ?Pulmonary exam normal ?breath sounds clear to auscultation ? ? ? ? ? ? Cardiovascular ?Exercise Tolerance: Good ?hypertension, negative cardio ROS ? ? ?Rhythm:regular Rate:Normal ? ? ?  ?Neuro/Psych ? Headaches, PSYCHIATRIC DISORDERS Depression  Neuromuscular disease   ? GI/Hepatic ?negative GI ROS, Neg liver ROS,   ?Endo/Other  ?negative endocrine ROS ? Renal/GU ?negative Renal ROS  ?negative genitourinary ?  ?Musculoskeletal ? ? Abdominal ?  ?Peds ? Hematology ?negative hematology ROS ?(+)   ?Anesthesia Other Findings ? ? Reproductive/Obstetrics ?negative OB ROS ? ?  ? ? ? ? ? ? ? ? ? ? ? ? ? ?  ?  ? ? ? ? ? ? ? ? ?Anesthesia Physical ?Anesthesia Plan ? ?ASA: 3 ? ?Anesthesia Plan: General  ? ?Post-op Pain Management:   ? ?Induction:  ? ?PONV Risk Score and Plan: Propofol infusion ? ?Airway Management Planned:  ? ?Additional Equipment:  ? ?Intra-op Plan:  ? ?Post-operative Plan:  ? ?Informed Consent: I have reviewed the patients History and Physical, chart, labs and discussed the procedure including the risks, benefits and alternatives for the proposed anesthesia with the patient or authorized representative who has indicated his/her understanding and acceptance.  ? ? ? ?Dental Advisory Given ? ?Plan Discussed with: CRNA ? ?Anesthesia Plan Comments:   ? ? ? ? ? ? ?Anesthesia Quick Evaluation ? ?

## 2021-08-12 ENCOUNTER — Telehealth (INDEPENDENT_AMBULATORY_CARE_PROVIDER_SITE_OTHER): Payer: Self-pay | Admitting: *Deleted

## 2021-08-12 ENCOUNTER — Other Ambulatory Visit (INDEPENDENT_AMBULATORY_CARE_PROVIDER_SITE_OTHER): Payer: Self-pay | Admitting: *Deleted

## 2021-08-12 DIAGNOSIS — K529 Noninfective gastroenteritis and colitis, unspecified: Secondary | ICD-10-CM

## 2021-08-12 NOTE — Telephone Encounter (Signed)
Patient had procedure yesterday and states Dr. Jenetta Downer did not received resutls from stool sample. She said she was asked to get in touch with lab and she did and was told they did not receive her sample which she says she did take back. Does she still need test for c. Difficle. If so she would like at [facility] [place]. I can put in order for [facility] if you wanted her to go and did not want to change any orders.  ?

## 2021-08-12 NOTE — Telephone Encounter (Signed)
Order put in and pt was notified.  ?

## 2021-08-12 NOTE — Anesthesia Postprocedure Evaluation (Signed)
Anesthesia Post Note ? ?Patient: Margaret Cockerill Wintle ? ?Procedure(s) Performed: ESOPHAGOGASTRODUODENOSCOPY (EGD) WITH PROPOFOL ?BIOPSY ? ?Patient location during evaluation: Phase II ?Anesthesia Type: General ?Level of consciousness: awake ?Pain management: pain level controlled ?Vital Signs Assessment: post-procedure vital signs reviewed and stable ?Respiratory status: spontaneous breathing and respiratory function stable ?Cardiovascular status: blood pressure returned to baseline and stable ?Postop Assessment: no headache and no apparent nausea or vomiting ?Anesthetic complications: no ?Comments: Late entry ? ? ?No notable events documented. ? ? ?Last Vitals:  ?Vitals:  ? 08/11/21 1219 08/11/21 1359  ?BP: 132/62 114/62  ?Pulse: (!) 56 63  ?Resp: 16 16  ?Temp: 36.7 ?C   ?SpO2: 100% 98%  ?  ?Last Pain:  ?Vitals:  ? 08/11/21 1359  ?PainSc: 0-No pain  ? ? ?  ?  ?  ?  ?  ?  ? ?Louann Sjogren ? ? ? ? ?

## 2021-08-12 NOTE — Telephone Encounter (Signed)
Yes, should perform c. Diff testing . Please send her the order. ?

## 2021-08-13 ENCOUNTER — Other Ambulatory Visit (INDEPENDENT_AMBULATORY_CARE_PROVIDER_SITE_OTHER): Payer: Self-pay | Admitting: Gastroenterology

## 2021-08-13 DIAGNOSIS — K2990 Gastroduodenitis, unspecified, without bleeding: Secondary | ICD-10-CM

## 2021-08-13 LAB — SURGICAL PATHOLOGY

## 2021-08-13 MED ORDER — PANTOPRAZOLE SODIUM 40 MG PO TBEC: 40.0000 mg | DELAYED_RELEASE_TABLET | Freq: Every day | ORAL | 3 refills | Status: AC

## 2021-08-14 ENCOUNTER — Encounter (HOSPITAL_COMMUNITY): Payer: Self-pay | Admitting: Gastroenterology

## 2021-08-19 DIAGNOSIS — K529 Noninfective gastroenteritis and colitis, unspecified: Secondary | ICD-10-CM | POA: Diagnosis not present

## 2021-08-24 ENCOUNTER — Other Ambulatory Visit (INDEPENDENT_AMBULATORY_CARE_PROVIDER_SITE_OTHER): Payer: Self-pay | Admitting: Gastroenterology

## 2021-08-24 DIAGNOSIS — R197 Diarrhea, unspecified: Secondary | ICD-10-CM

## 2021-08-24 DIAGNOSIS — K529 Noninfective gastroenteritis and colitis, unspecified: Secondary | ICD-10-CM

## 2021-08-25 LAB — C DIFFICILE, CYTOTOXIN B

## 2021-08-25 LAB — C DIFFICILE TOXINS A+B W/RFLX: C difficile Toxins A+B, EIA: NEGATIVE

## 2021-09-29 ENCOUNTER — Ambulatory Visit (INDEPENDENT_AMBULATORY_CARE_PROVIDER_SITE_OTHER): Payer: Medicare Other | Admitting: Gastroenterology

## 2021-10-01 DIAGNOSIS — M818 Other osteoporosis without current pathological fracture: Secondary | ICD-10-CM | POA: Diagnosis not present

## 2021-10-01 DIAGNOSIS — Z780 Asymptomatic menopausal state: Secondary | ICD-10-CM | POA: Diagnosis not present

## 2021-10-01 DIAGNOSIS — M8580 Other specified disorders of bone density and structure, unspecified site: Secondary | ICD-10-CM | POA: Diagnosis not present

## 2021-10-01 DIAGNOSIS — C50412 Malignant neoplasm of upper-outer quadrant of left female breast: Secondary | ICD-10-CM | POA: Diagnosis not present

## 2021-10-07 DIAGNOSIS — Z299 Encounter for prophylactic measures, unspecified: Secondary | ICD-10-CM | POA: Diagnosis not present

## 2021-10-07 DIAGNOSIS — I10 Essential (primary) hypertension: Secondary | ICD-10-CM | POA: Diagnosis not present

## 2021-10-07 DIAGNOSIS — K219 Gastro-esophageal reflux disease without esophagitis: Secondary | ICD-10-CM | POA: Diagnosis not present

## 2021-10-07 DIAGNOSIS — R319 Hematuria, unspecified: Secondary | ICD-10-CM | POA: Diagnosis not present

## 2021-10-07 DIAGNOSIS — Z6825 Body mass index (BMI) 25.0-25.9, adult: Secondary | ICD-10-CM | POA: Diagnosis not present

## 2021-10-07 DIAGNOSIS — N390 Urinary tract infection, site not specified: Secondary | ICD-10-CM | POA: Diagnosis not present

## 2021-10-07 DIAGNOSIS — R109 Unspecified abdominal pain: Secondary | ICD-10-CM | POA: Diagnosis not present

## 2021-10-09 DIAGNOSIS — N1330 Unspecified hydronephrosis: Secondary | ICD-10-CM | POA: Diagnosis not present

## 2021-10-09 DIAGNOSIS — Z885 Allergy status to narcotic agent status: Secondary | ICD-10-CM | POA: Diagnosis not present

## 2021-10-09 DIAGNOSIS — N12 Tubulo-interstitial nephritis, not specified as acute or chronic: Secondary | ICD-10-CM | POA: Diagnosis not present

## 2021-10-09 DIAGNOSIS — I700 Atherosclerosis of aorta: Secondary | ICD-10-CM | POA: Diagnosis not present

## 2021-10-09 DIAGNOSIS — Z609 Problem related to social environment, unspecified: Secondary | ICD-10-CM | POA: Diagnosis not present

## 2021-10-09 DIAGNOSIS — N134 Hydroureter: Secondary | ICD-10-CM | POA: Diagnosis not present

## 2021-10-12 DIAGNOSIS — M797 Fibromyalgia: Secondary | ICD-10-CM | POA: Diagnosis not present

## 2021-10-12 DIAGNOSIS — Z299 Encounter for prophylactic measures, unspecified: Secondary | ICD-10-CM | POA: Diagnosis not present

## 2021-10-12 DIAGNOSIS — N12 Tubulo-interstitial nephritis, not specified as acute or chronic: Secondary | ICD-10-CM | POA: Diagnosis not present

## 2021-10-12 DIAGNOSIS — Z789 Other specified health status: Secondary | ICD-10-CM | POA: Diagnosis not present

## 2021-10-12 DIAGNOSIS — I10 Essential (primary) hypertension: Secondary | ICD-10-CM | POA: Diagnosis not present

## 2021-10-15 DIAGNOSIS — N12 Tubulo-interstitial nephritis, not specified as acute or chronic: Secondary | ICD-10-CM | POA: Diagnosis not present

## 2021-10-15 DIAGNOSIS — Z299 Encounter for prophylactic measures, unspecified: Secondary | ICD-10-CM | POA: Diagnosis not present

## 2021-10-15 DIAGNOSIS — I10 Essential (primary) hypertension: Secondary | ICD-10-CM | POA: Diagnosis not present

## 2021-10-15 DIAGNOSIS — Z789 Other specified health status: Secondary | ICD-10-CM | POA: Diagnosis not present

## 2021-10-19 DIAGNOSIS — M797 Fibromyalgia: Secondary | ICD-10-CM | POA: Diagnosis not present

## 2021-10-19 DIAGNOSIS — I10 Essential (primary) hypertension: Secondary | ICD-10-CM | POA: Diagnosis not present

## 2021-10-19 DIAGNOSIS — N368 Other specified disorders of urethra: Secondary | ICD-10-CM | POA: Diagnosis not present

## 2021-10-19 DIAGNOSIS — Z789 Other specified health status: Secondary | ICD-10-CM | POA: Diagnosis not present

## 2021-10-19 DIAGNOSIS — Z299 Encounter for prophylactic measures, unspecified: Secondary | ICD-10-CM | POA: Diagnosis not present

## 2021-10-20 ENCOUNTER — Other Ambulatory Visit: Payer: Self-pay

## 2021-10-20 ENCOUNTER — Ambulatory Visit (INDEPENDENT_AMBULATORY_CARE_PROVIDER_SITE_OTHER): Payer: Medicare Other | Admitting: Urology

## 2021-10-20 ENCOUNTER — Encounter: Payer: Self-pay | Admitting: Urology

## 2021-10-20 VITALS — BP 116/67 | HR 63 | Ht 63.0 in | Wt 144.0 lb

## 2021-10-20 DIAGNOSIS — R310 Gross hematuria: Secondary | ICD-10-CM | POA: Insufficient documentation

## 2021-10-20 DIAGNOSIS — N1330 Unspecified hydronephrosis: Secondary | ICD-10-CM

## 2021-10-20 LAB — BLADDER SCAN AMB NON-IMAGING: Scan Result: 5

## 2021-10-20 NOTE — Progress Notes (Signed)
? ?Assessment: ?1. Gross hematuria   ?2. Hydronephrosis, right   ? ? ?Plan: ?I personally reviewed the CT study from 10/10/2021 with results as noted below.  There is evidence of right hydronephrosis but no obvious renal or ureteral calculi. ?I also reviewed the records from her ER visit including laboratory results. ?Today I had a discussion with the patient regarding the findings of gross hematuria including the implications and differential diagnoses associated with it.  I also discussed recommendations for further evaluation including the rationale for upper tract imaging and cystoscopy.  I discussed the nature of these procedures including potential risk and complications.  The patient expressed an understanding of these issues. ?Recommend repeat evaluation with a contrasted CT study followed by cystoscopy. ?BMP today ? ? ?Chief Complaint:  ?Chief Complaint  ?Patient presents with  ? Hematuria  ? ? ?History of Present Illness: ? ?Jacqueline Christian is a 76 y.o. year old female who is seen in consultation from Jerene Bears B, MD for evaluation of hematuria and right hydronephrosis.  She had onset of right lower quadrant and right flank pain approximately 10 days ago.  This was associated with nausea and vomiting.  No dysuria or gross hematuria at that time.  She was evaluated in the emergency room in [place]. ?CT abdomen and pelvis without contrast from 10/10/2021 demonstrated moderate right hydronephrosis progressed since CT from 07/08/2021, no renal or ureteral calculi, mild left renal parenchymal atrophy. ?Urinalysis from 10/09/2021 demonstrated 25 RBCs, 25 WBCs, and nitrite positive.  Urine culture showed no growth. ?She followed up with her PCP and was placed on antibiotics for UTI.  She completed the antibiotics yesterday.  She reports a repeat urinalysis at her PCP which did not show evidence of infection but did show evidence of blood.  She reports an episode of gross hematuria 2 days ago.  Her abdominal and flank  pain have improved.  No prior history of kidney stones.  No recent UTIs.  No history of tobacco use. ? ?CT from 07/08/2021 showed bilateral extrarenal pelves, no renal or ureteral calculi. ? ?She has baseline symptoms of urinary frequency and urgency.  No urinary incontinence.  She is currently on Ditropan XL 5 mg daily. ? ?Past Medical History:  ?Past Medical History:  ?Diagnosis Date  ? Arthritis   ? Bladder spasms   ? takes oxybutinin  ? Breast cancer of upper-outer quadrant of left female breast ([place]) 06/12/2018  ? Depression   ? situational  ? Dizziness   ? Headache(784.0)   ? Hypertension   ? Memory difficulties 12/31/2014  ? Neoplasm of brain ([place])   ? Pneumonia   ? hx  ? Recurrent sinus infections   ? just finished erythromycin dosing 06/18/11  ? Rheumatoid arthritis ([place])   ? UTI (lower urinary tract infection)   ? Vision changes   ? r/t brain tumor  ? ? ?Past Surgical History:  ?Past Surgical History:  ?Procedure Laterality Date  ? ABDOMINAL HYSTERECTOMY    ? APPENDECTOMY    ? BIOPSY  08/11/2021  ? Procedure: BIOPSY;  Surgeon: Montez Morita, Quillian Quince, MD;  [facility];  Service: Gastroenterology;;  ? BREAST LUMPECTOMY WITH RADIOACTIVE SEED AND SENTINEL LYMPH NODE BIOPSY Left 06/12/2018  ? Procedure: LEFT BREAST LUMPECTOMY WITH RADIOACTIVE SEED AND LEFT AXILLARY DEEP SENTINEL LYMPH NODE BIOPSY INJECT BLUE DYE LEFT BREAST;  Surgeon: Fanny Skates, MD;  Location: [place];  Service: General;  Laterality: Left;  ? CRANIOTOMY  06/22/2011  ?  Procedure: CRANIOTOMY TUMOR EXCISION;  Surgeon: Peggyann Shoals, MD;  Location: [place] [facility];  Service: Neurosurgery;  Laterality: Right;  RIGHT pterional Craniotomy for meningioma  ? CRANIOTOMY  06/22/2011  ? Procedure: CRANIOTOMY HEMATOMA EVACUATION SUBDURAL;  Surgeon: Peggyann Shoals, MD;  Location: [place] [facility];  Service: Neurosurgery;  Laterality: Right;  ? CYSTECTOMY    ? L wrist  ? ESOPHAGOGASTRODUODENOSCOPY (EGD) WITH PROPOFOL N/A 08/11/2021  ?  Procedure: ESOPHAGOGASTRODUODENOSCOPY (EGD) WITH PROPOFOL;  Surgeon: Harvel Quale, MD;  [facility];  Service: Gastroenterology;  Laterality: N/A;  1215  ? EYE SURGERY  06/08/1975  ? retina repair  ? GANGLION CYST EXCISION Left 1978  ? left wrist  ? PORTACATH PLACEMENT Right 06/12/2018  ? Procedure: INSERTION PORT-A-CATH;  Surgeon: Fanny Skates, MD;  Location: [place];  Service: General;  Laterality: Right;  ? STAPEDES SURGERY    ? repalced stapes  ? TOTAL KNEE ARTHROPLASTY    ? R  ? TOTAL KNEE ARTHROPLASTY Left 03/05/2014  ? Procedure: TOTAL KNEE ARTHROPLASTY;  Surgeon: Garald Balding, MD;  Location: [place];  Service: Orthopedics;  Laterality: Left;  ? ? ?Allergies:  ?Allergies  ?Allergen Reactions  ? Methocarbamol Other (See Comments)  ?  Other reaction(s): Other (See Comments) ?"sedated" ? ?  ? Morphine And Related Other (See Comments)  ?  Pt says it felt like her head was coming off.  ? ? ?Family History:  ?Family History  ?Problem Relation Age of Onset  ? Colon cancer Mother   ? Stroke Father   ? Healthy Brother   ? Cancer - Lung Sister   ? Heart attack Paternal Aunt   ? Heart attack Paternal Uncle   ? Heart attack Paternal Grandmother   ? Heart attack Paternal Grandfather   ? ? ?Social History:  ?Social History  ? ?Tobacco Use  ? Smoking status: Never  ? Smokeless tobacco: Never  ?Vaping Use  ? Vaping Use: Never used  ?Substance Use Topics  ? Alcohol use: No  ? Drug use: No  ? ? ?Review of symptoms:  ?Constitutional:  Negative for unexplained weight loss, night sweats, fever, chills ?ENT:  Negative for nose bleeds, sinus pain, painful swallowing ?CV:  Negative for chest pain, shortness of breath, exercise intolerance, palpitations, loss of consciousness ?Resp:  Negative for cough, wheezing, shortness of breath ?GI:  Negative for nausea, vomiting, diarrhea, bloody stools ?GU:  Positives noted in HPI; otherwise negative for gross hematuria, dysuria, urinary  incontinence ?Neuro:  Negative for seizures, poor balance, limb weakness, slurred speech ?Psych:  Negative for lack of energy, depression, anxiety ?Endocrine:  Negative for polydipsia, polyuria, symptoms of hypoglycemia (dizziness, hunger, sweating) ?Hematologic:  Negative for anemia, purpura, petechia, prolonged or excessive bleeding, use of anticoagulants  ?Allergic:  Negative for difficulty breathing or choking as a result of exposure to anything; no shellfish allergy; no allergic response (rash/itch) to materials, foods ? ?Physical exam: ?BP 116/67   Pulse 63   Ht '5\' 3"'$  (1.6 m)   Wt 144 lb (65.3 kg)   BMI 25.51 kg/m?  ?GENERAL APPEARANCE:  Well appearing, well developed, well nourished, NAD ?HEENT: Atraumatic, Normocephalic, oropharynx clear. ?NECK: Supple without lymphadenopathy or thyromegaly. ?LUNGS: Clear to auscultation bilaterally. ?HEART: Regular Rate and Rhythm without murmurs, gallops, or rubs. ?ABDOMEN: Soft, non-tender, No Masses. ?EXTREMITIES: Moves all extremities well.  Without clubbing, cyanosis, or edema. ?NEUROLOGIC:  Alert and oriented x 3, normal gait, CN II-XII grossly intact.  ?MENTAL STATUS:  Appropriate. ?BACK:  Non-tender to palpation.  No CVAT ?SKIN:  Warm, dry and intact.   ? ?Results: ?Patient unable to provide sample today. ? ?Bladder scan = 5 ml ? ?

## 2021-10-20 NOTE — Progress Notes (Signed)
post void residual=5 

## 2021-10-20 NOTE — Addendum Note (Signed)
Addended byIris Pert on: 10/20/2021 10:37 AM ? ? Modules accepted: Orders ? ?

## 2021-10-21 LAB — BASIC METABOLIC PANEL
BUN/Creatinine Ratio: 14 (ref 12–28)
BUN: 14 mg/dL (ref 8–27)
CO2: 25 mmol/L (ref 20–29)
Calcium: 9.7 mg/dL (ref 8.7–10.3)
Chloride: 95 mmol/L — ABNORMAL LOW (ref 96–106)
Creatinine, Ser: 0.99 mg/dL (ref 0.57–1.00)
Glucose: 92 mg/dL (ref 70–99)
Potassium: 3.2 mmol/L — ABNORMAL LOW (ref 3.5–5.2)
Sodium: 137 mmol/L (ref 134–144)
eGFR: 59 mL/min/{1.73_m2} — ABNORMAL LOW (ref >59)

## 2021-11-03 ENCOUNTER — Other Ambulatory Visit: Payer: Self-pay | Admitting: Urology

## 2021-11-03 ENCOUNTER — Other Ambulatory Visit: Payer: Medicare Other | Admitting: Urology

## 2021-11-03 ENCOUNTER — Telehealth: Payer: Self-pay

## 2021-11-03 DIAGNOSIS — R310 Gross hematuria: Secondary | ICD-10-CM

## 2021-11-03 DIAGNOSIS — N1330 Unspecified hydronephrosis: Secondary | ICD-10-CM

## 2021-11-03 MED ORDER — HYDROCODONE-ACETAMINOPHEN 5-325 MG PO TABS: 1.0000 | ORAL_TABLET | Freq: Four times a day (QID) | ORAL | 0 refills | Status: AC | PRN

## 2021-11-03 NOTE — Telephone Encounter (Signed)
Patient states she is having pain on her right side that is radiating to her back.  Patient states  when she voided this morning she noticed blood in her urine. Patient states she has been having chills for a couple of days with no fever.  Number given to call and schedule CT.   Please advise.

## 2021-11-03 NOTE — Telephone Encounter (Signed)
Patient called and made aware.

## 2021-11-03 NOTE — Telephone Encounter (Signed)
I contacted patient to reschedule due to her not being contacted to schedule CT. Patient advised that she was experiencing chills and pain for the past couple of days. She advised she has been taking pain medication but the counter medication has not helped to ease the pain. She wanted to know if she could have pain medication called into her pharmacy until her Cysto.    I am contacting radiology to schedule CT and will contact patient to reschedule Cysto.

## 2021-11-03 NOTE — Progress Notes (Deleted)
Assessment: 1. Gross hematuria   2. Hydronephrosis, right     Plan: I personally reviewed the CT study from 10/10/2021 with results as noted below.  There is evidence of right hydronephrosis but no obvious renal or ureteral calculi. I also reviewed the records from her ER visit including laboratory results. Today I had a discussion with the patient regarding the findings of gross hematuria including the implications and differential diagnoses associated with it.  I also discussed recommendations for further evaluation including the rationale for upper tract imaging and cystoscopy.  I discussed the nature of these procedures including potential risk and complications.  The patient expressed an understanding of these issues. Recommend repeat evaluation with a contrasted CT study followed by cystoscopy. BMP today   Chief Complaint:  No chief complaint on file.   History of Present Illness:  Jacqueline Christian is a 76 y.o. year old female who is seen for further evaluation of hematuria and right hydronephrosis.  She had onset of right lower quadrant and right flank pain in early May 2023.  This was associated with nausea and vomiting.  No dysuria or gross hematuria at that time.  She was evaluated in the emergency room in [place]. CT abdomen and pelvis without contrast from 10/10/2021 demonstrated moderate right hydronephrosis progressed since CT from 07/08/2021, no renal or ureteral calculi, mild left renal parenchymal atrophy. Urinalysis from 10/09/2021 demonstrated 25 RBCs, 25 WBCs, and nitrite positive.  Urine culture showed no growth. She followed up with her PCP and was placed on antibiotics for UTI.  She completed the antibiotics.  She reports a repeat urinalysis at her PCP which did not show evidence of infection but did show evidence of blood.  She reports an episode of gross hematuria on 10/17/21.  Her abdominal and flank pain have improved.  No prior history of kidney stones.  No recent UTIs.  No  history of tobacco use.  CT from 07/08/2021 showed bilateral extrarenal pelves, no renal or ureteral calculi.  She has baseline symptoms of urinary frequency and urgency.  No urinary incontinence.  She is currently on Ditropan XL 5 mg daily.  Portions of the above documentation were copied from a prior visit for review purposes only.   Past Medical History:  Past Medical History:  Diagnosis Date   Arthritis    Bladder spasms    takes oxybutinin   Breast cancer of upper-outer quadrant of left female breast ([place]) 06/12/2018   Depression    situational   Dizziness    Headache(784.0)    Hypertension    Memory difficulties 12/31/2014   Neoplasm of brain ([place])    Pneumonia    hx   Recurrent sinus infections    just finished erythromycin dosing 06/18/11   Rheumatoid arthritis ([place])    UTI (lower urinary tract infection)    Vision changes    r/t brain tumor    Past Surgical History:  Past Surgical History:  Procedure Laterality Date   ABDOMINAL HYSTERECTOMY     APPENDECTOMY     BIOPSY  08/11/2021   Procedure: BIOPSY;  Surgeon: Harvel Quale, MD;  [facility];  Service: Gastroenterology;;   BREAST LUMPECTOMY WITH RADIOACTIVE SEED AND SENTINEL LYMPH NODE BIOPSY Left 06/12/2018   Procedure: LEFT BREAST LUMPECTOMY WITH RADIOACTIVE SEED AND LEFT AXILLARY DEEP SENTINEL LYMPH NODE BIOPSY INJECT BLUE DYE LEFT BREAST;  Surgeon: Fanny Skates, MD;  Location: [place];  Service: General;  Laterality: Left;   CRANIOTOMY  06/22/2011   Procedure: CRANIOTOMY TUMOR EXCISION;  Surgeon: Peggyann Shoals, MD;  Location: [place] [facility];  Service: Neurosurgery;  Laterality: Right;  RIGHT pterional Craniotomy for meningioma   CRANIOTOMY  06/22/2011   Procedure: CRANIOTOMY HEMATOMA EVACUATION SUBDURAL;  Surgeon: Peggyann Shoals, MD;  Location: [place] [facility];  Service: Neurosurgery;  Laterality: Right;   CYSTECTOMY     L wrist   ESOPHAGOGASTRODUODENOSCOPY (EGD) WITH  PROPOFOL N/A 08/11/2021   Procedure: ESOPHAGOGASTRODUODENOSCOPY (EGD) WITH PROPOFOL;  Surgeon: Harvel Quale, MD;  [facility];  Service: Gastroenterology;  Laterality: N/A;  1215   EYE SURGERY  06/08/1975   retina repair   GANGLION CYST EXCISION Left 1978   left wrist   PORTACATH PLACEMENT Right 06/12/2018   Procedure: INSERTION PORT-A-CATH;  Surgeon: Fanny Skates, MD;  Location: [facility];  Service: General;  Laterality: Right;   STAPEDES SURGERY     repalced stapes   TOTAL KNEE ARTHROPLASTY     R   TOTAL KNEE ARTHROPLASTY Left 03/05/2014   Procedure: TOTAL KNEE ARTHROPLASTY;  Surgeon: Garald Balding, MD;  Location: [place];  Service: Orthopedics;  Laterality: Left;    Allergies:  Allergies  Allergen Reactions   Methocarbamol Other (See Comments)    Other reaction(s): Other (See Comments) "sedated"     Morphine And Related Other (See Comments)    Pt says it felt like her head was coming off.    Family History:  Family History  Problem Relation Age of Onset   Colon cancer Mother    Stroke Father    Healthy Brother    Cancer - Lung Sister    Heart attack Paternal Aunt    Heart attack Paternal Uncle    Heart attack Paternal Grandmother    Heart attack Paternal Grandfather     Social History:  Social History   Tobacco Use   Smoking status: Never   Smokeless tobacco: Never  Vaping Use   Vaping Use: Never used  Substance Use Topics   Alcohol use: No   Drug use: No    ROS: Constitutional:  Negative for fever, chills, weight loss CV: Negative for chest pain, previous MI, hypertension Respiratory:  Negative for shortness of breath, wheezing, sleep apnea, frequent cough GI:  Negative for nausea, vomiting, bloody stool, GERD  Physical exam: There were no vitals taken for this visit. GENERAL APPEARANCE:  Well appearing, well developed, well nourished, NAD HEENT:  Atraumatic, normocephalic, oropharynx clear NECK:  Supple  without lymphadenopathy or thyromegaly ABDOMEN:  Soft, non-tender, no masses EXTREMITIES:  Moves all extremities well, without clubbing, cyanosis, or edema NEUROLOGIC:  Alert and oriented x 3, normal gait, CN II-XII grossly intact MENTAL STATUS:  appropriate BACK:  Non-tender to palpation, No CVAT SKIN:  Warm, dry, and intact  Results: U/A:

## 2021-11-05 ENCOUNTER — Ambulatory Visit (HOSPITAL_COMMUNITY)
Admission: RE | Admit: 2021-11-05 | Discharge: 2021-11-05 | Disposition: A | Discharge status: Home / Self Care | Payer: Medicare Other | Source: Ambulatory Visit | Attending: Urology | Admitting: Urology

## 2021-11-05 DIAGNOSIS — N134 Hydroureter: Secondary | ICD-10-CM | POA: Diagnosis not present

## 2021-11-05 DIAGNOSIS — N1330 Unspecified hydronephrosis: Secondary | ICD-10-CM | POA: Diagnosis not present

## 2021-11-05 DIAGNOSIS — R310 Gross hematuria: Secondary | ICD-10-CM | POA: Insufficient documentation

## 2021-11-05 DIAGNOSIS — I700 Atherosclerosis of aorta: Secondary | ICD-10-CM | POA: Diagnosis not present

## 2021-11-05 DIAGNOSIS — R319 Hematuria, unspecified: Secondary | ICD-10-CM | POA: Diagnosis not present

## 2021-11-05 MED ORDER — SODIUM CHLORIDE (PF) 0.9 % IJ SOLN
INTRAMUSCULAR | Status: AC
  Filled 2021-11-05: qty 300

## 2021-11-05 MED ORDER — IOHEXOL 300 MG/ML  SOLN
100.0000 mL | Freq: Once | INTRAMUSCULAR | Status: AC | PRN
  Administered 2021-11-05: 100 mL via INTRAVENOUS

## 2021-11-06 ENCOUNTER — Encounter: Payer: Self-pay | Admitting: Urology

## 2021-11-06 ENCOUNTER — Ambulatory Visit (INDEPENDENT_AMBULATORY_CARE_PROVIDER_SITE_OTHER): Payer: Medicare Other | Admitting: Urology

## 2021-11-06 VITALS — BP 125/61 | HR 61 | Ht 63.0 in | Wt 142.0 lb

## 2021-11-06 DIAGNOSIS — R310 Gross hematuria: Secondary | ICD-10-CM | POA: Diagnosis not present

## 2021-11-06 DIAGNOSIS — R8290 Unspecified abnormal findings in urine: Secondary | ICD-10-CM | POA: Diagnosis not present

## 2021-11-06 DIAGNOSIS — N1330 Unspecified hydronephrosis: Secondary | ICD-10-CM | POA: Diagnosis not present

## 2021-11-06 LAB — URINALYSIS, ROUTINE W REFLEX MICROSCOPIC
Bilirubin, UA: NEGATIVE
Glucose, UA: NEGATIVE
Ketones, UA: NEGATIVE
Leukocytes,UA: NEGATIVE
Nitrite, UA: NEGATIVE
Specific Gravity, UA: 1.015 (ref 1.005–1.030)
Urobilinogen, Ur: 0.2 mg/dL (ref 0.2–1.0)
pH, UA: 6 (ref 5.0–7.5)

## 2021-11-06 LAB — MICROSCOPIC EXAMINATION
Bacteria, UA: NONE SEEN
RBC, Urine: >30 /hpf — AB (ref 0–2)
Renal Epithel, UA: NONE SEEN /hpf
WBC, UA: NONE SEEN /hpf (ref 0–5)

## 2021-11-06 MED ORDER — CIPROFLOXACIN HCL 500 MG PO TABS: 500.0000 mg | ORAL_TABLET | Freq: Two times a day (BID) | ORAL | 0 refills | Status: AC

## 2021-11-06 MED ORDER — CIPROFLOXACIN HCL 500 MG PO TABS
500.0000 mg | ORAL_TABLET | Freq: Once | ORAL | Status: AC
  Administered 2021-11-06: 500 mg via ORAL

## 2021-11-06 NOTE — Progress Notes (Signed)
Assessment: 1. Gross hematuria   2. Hydronephrosis, right     Plan: I personally reviewed the CT study from 11/05/2021.  The result shows mild right hydronephrosis versus extra renal pelvis but no evidence of obstruction as there is prompt excretion of contrast.  No filling defect noted. [facility] Mdx culture on voided sample Urine cytology sent on bladder wash Cipro 500 mg BID x 3 days Return to office in 4 weeks  Chief Complaint:  Chief Complaint  Patient presents with   Hematuria    History of Present Illness:  Jacqueline Christian is a 76 y.o. year old female who is seen for further evaluation of hematuria and right hydronephrosis.  She had onset of right lower quadrant and right flank pain in early May 2023.  This was associated with nausea and vomiting.  No dysuria or gross hematuria at that time.  She was evaluated in the emergency room in [place]. CT abdomen and pelvis without contrast from 10/10/2021 demonstrated moderate right hydronephrosis progressed since CT from 07/08/2021, no renal or ureteral calculi, mild left renal parenchymal atrophy. Urinalysis from 10/09/2021 demonstrated 25 RBCs, 25 WBCs, and nitrite positive.  Urine culture showed no growth. She followed up with her PCP and was placed on antibiotics for UTI.  She completed the antibiotics.  She reports a repeat urinalysis at her PCP which did not show evidence of infection but did show evidence of blood.  She reported an episode of gross hematuria on 10/17/21.  Her abdominal and flank pain improved.  No prior history of kidney stones.  No recent UTIs.  No history of tobacco use.  CT from 07/08/2021 showed bilateral extrarenal pelves, no renal or ureteral calculi.  She has baseline symptoms of urinary frequency and urgency.  No urinary incontinence.  She has been managed with Ditropan XL 5 mg daily.  CT hematuria protocol from 11/05/2021 showed mild right hydronephrosis and hydroureter decreased as compared to prior examination, no  ureteral or renal calculi, no renal mass, no filling defect noted.  She presents today for cystoscopy.  She has had further right-sided flank pain and intermittent gross hematuria since her last visit.  This occurred earlier this week.  No dysuria.  She has experienced some chills.  Portions of the above documentation were copied from a prior visit for review purposes only.   Past Medical History:  Past Medical History:  Diagnosis Date   Arthritis    Bladder spasms    takes oxybutinin   Breast cancer of upper-outer quadrant of left female breast ([place]) 06/12/2018   Depression    situational   Dizziness    Headache(784.0)    Hypertension    Memory difficulties 12/31/2014   Neoplasm of brain ([place])    Pneumonia    hx   Recurrent sinus infections    just finished erythromycin dosing 06/18/11   Rheumatoid arthritis ([place])    UTI (lower urinary tract infection)    Vision changes    r/t brain tumor    Past Surgical History:  Past Surgical History:  Procedure Laterality Date   ABDOMINAL HYSTERECTOMY     APPENDECTOMY     BIOPSY  08/11/2021   Procedure: BIOPSY;  Surgeon: Harvel Quale, MD;  [facility];  Service: Gastroenterology;;   BREAST LUMPECTOMY WITH RADIOACTIVE SEED AND SENTINEL LYMPH NODE BIOPSY Left 06/12/2018   Procedure: LEFT BREAST LUMPECTOMY WITH RADIOACTIVE SEED AND LEFT AXILLARY DEEP SENTINEL LYMPH NODE BIOPSY INJECT BLUE DYE LEFT BREAST;  Surgeon:  Fanny Skates, MD;  Location[facility];  Service: General;  Laterality: Left;   CRANIOTOMY  06/22/2011   Procedure: CRANIOTOMY TUMOR EXCISION;  Surgeon: Peggyann Shoals, MD;  Location: [place] [facility];  Service: Neurosurgery;  Laterality: Right;  RIGHT pterional Craniotomy for meningioma   CRANIOTOMY  06/22/2011   Procedure: CRANIOTOMY HEMATOMA EVACUATION SUBDURAL;  Surgeon: Peggyann Shoals, MD;  Location: [place] [facility];  Service: Neurosurgery;  Laterality: Right;   CYSTECTOMY     L wrist    ESOPHAGOGASTRODUODENOSCOPY (EGD) WITH PROPOFOL N/A 08/11/2021   Procedure: ESOPHAGOGASTRODUODENOSCOPY (EGD) WITH PROPOFOL;  Surgeon: Harvel Quale, MD;  [facility];  Service: Gastroenterology;  Laterality: N/A;  1215   EYE SURGERY  06/08/1975   retina repair   GANGLION CYST EXCISION Left 1978   left wrist   PORTACATH PLACEMENT Right 06/12/2018   Procedure: INSERTION PORT-A-CATH;  Surgeon: Fanny Skates, MD;  Location: [place];  Service: General;  Laterality: Right;   STAPEDES SURGERY     repalced stapes   TOTAL KNEE ARTHROPLASTY     R   TOTAL KNEE ARTHROPLASTY Left 03/05/2014   Procedure: TOTAL KNEE ARTHROPLASTY;  Surgeon: Garald Balding, MD;  Location: [place];  Service: Orthopedics;  Laterality: Left;    Allergies:  Allergies  Allergen Reactions   Methocarbamol Other (See Comments)    Other reaction(s): Other (See Comments) "sedated"     Morphine And Related Other (See Comments)    Pt says it felt like her head was coming off.    Family History:  Family History  Problem Relation Age of Onset   Colon cancer Mother    Stroke Father    Healthy Brother    Cancer - Lung Sister    Heart attack Paternal Aunt    Heart attack Paternal Uncle    Heart attack Paternal Grandmother    Heart attack Paternal Grandfather     Social History:  Social History   Tobacco Use   Smoking status: Never   Smokeless tobacco: Never  Vaping Use   Vaping Use: Never used  Substance Use Topics   Alcohol use: No   Drug use: No    ROS: Constitutional:  Negative for fever, chills, weight loss CV: Negative for chest pain, previous MI, hypertension Respiratory:  Negative for shortness of breath, wheezing, sleep apnea, frequent cough GI:  Negative for nausea, vomiting, bloody stool, GERD  Physical exam: BP 125/61   Pulse 61   Ht '5\' 3"'$  (1.6 m)   Wt 142 lb (64.4 kg)   BMI 25.15 kg/m  GENERAL APPEARANCE:  Well appearing, well developed, well  nourished, NAD HEENT:  Atraumatic, normocephalic, oropharynx clear NECK:  Supple without lymphadenopathy or thyromegaly ABDOMEN:  Soft, non-tender, no masses EXTREMITIES:  Moves all extremities well, without clubbing, cyanosis, or edema NEUROLOGIC:  Alert and oriented x 3, normal gait, CN II-XII grossly intact MENTAL STATUS:  appropriate BACK:  Non-tender to palpation, No CVAT SKIN:  Warm, dry, and intact  Results: U/A: >30 RBCs  Procedure:  Flexible Cystourethroscopy  Pre-operative Diagnosis: Gross hematuria  Post-operative Diagnosis: Gross hematuria  Anesthesia:  local with lidocaine jelly  Surgical Narrative:  After appropriate informed consent was obtained, the patient was prepped and draped in the usual sterile fashion in the supine position.  The patient was correctly identified and the proper procedure delineated prior to proceeding.  Sterile lidocaine gel was instilled in the urethra. The flexible cystoscope was introduced without difficulty.  Findings:  Urethra: Normal  Bladder: Normal  Ureteral orifices: normal  Additional findings: clear efflux bilaterally  Saline bladder wash for cytology was performed.    The cystoscope was then removed.  The patient tolerated the procedure well.

## 2021-11-11 ENCOUNTER — Other Ambulatory Visit: Payer: Self-pay | Admitting: Urology

## 2021-11-12 ENCOUNTER — Other Ambulatory Visit: Payer: Self-pay | Admitting: Urology

## 2021-11-13 ENCOUNTER — Telehealth: Payer: Self-pay

## 2021-11-13 NOTE — Telephone Encounter (Signed)
-----   Message from Primus Bravo, MD sent at 11/12/2021  5:40 PM EDT ----- Regarding: Test results Please notify Mrs. Odekirk that her urine culture did not show evidence of a UTI.  Also the urine cytology did not show any malignant or suspicious cells in her urine. No additional antibiotics are indicated at this time. Recommend follow-up in 4 weeks as scheduled.

## 2021-11-13 NOTE — Telephone Encounter (Signed)
Patient aware of results, she still has questions of the blood in her urine and still having pain.  Moved her f/u to a sooner date per her request.

## 2021-11-24 DIAGNOSIS — Z23 Encounter for immunization: Secondary | ICD-10-CM | POA: Diagnosis not present

## 2021-11-27 ENCOUNTER — Encounter: Payer: Self-pay | Admitting: Urology

## 2021-11-27 ENCOUNTER — Ambulatory Visit (INDEPENDENT_AMBULATORY_CARE_PROVIDER_SITE_OTHER): Payer: Medicare Other | Admitting: Urology

## 2021-11-27 VITALS — BP 128/71 | HR 70

## 2021-11-27 DIAGNOSIS — N1330 Unspecified hydronephrosis: Secondary | ICD-10-CM

## 2021-11-27 DIAGNOSIS — R310 Gross hematuria: Secondary | ICD-10-CM

## 2021-11-27 NOTE — Progress Notes (Signed)
Assessment: 1. Gross hematuria   2. Hydronephrosis, right     Plan: Hematuria profile sent today Return to office in 2 months with pre-visit renal U/S  Chief Complaint:  Chief Complaint  Patient presents with   Hematuria    History of Present Illness:  Jacqueline Christian is a 76 y.o. year old female who is seen for further evaluation of hematuria and right hydronephrosis.  She had onset of right lower quadrant and right flank pain in early May 2023.  This was associated with nausea and vomiting.  No dysuria or gross hematuria at that time.  She was evaluated in the emergency room in [place]. CT abdomen and pelvis without contrast from 10/10/2021 demonstrated moderate right hydronephrosis progressed since CT from 07/08/2021, no renal or ureteral calculi, mild left renal parenchymal atrophy. Urinalysis from 10/09/2021 demonstrated 25 RBCs, 25 WBCs, and nitrite positive.  Urine culture showed no growth. She followed up with her PCP and was placed on antibiotics for UTI.  She completed the antibiotics.  She reports a repeat urinalysis at her PCP which did not show evidence of infection but did show evidence of blood.  She reported an episode of gross hematuria on 10/17/21.  Her abdominal and flank pain improved.  No prior history of kidney stones.  No recent UTIs.  No history of tobacco use.  CT from 07/08/2021 showed bilateral extrarenal pelves, no renal or ureteral calculi.  She has baseline symptoms of urinary frequency and urgency.  No urinary incontinence.  She has been managed with Ditropan XL 5 mg daily.  CT hematuria protocol from 11/05/2021 showed mild right hydronephrosis and hydroureter decreased as compared to prior examination, no ureteral or renal calculi, no renal mass, no filling defect noted. Cystoscopy from 11/06/2021 showed a normal urethra and bladder. Urine cytology was negative for malignancy.  She returns today for follow-up.  She has not had any further flank or abdominal pain.  No  back pain.  She reports occasional episodes of right lower quadrant pain very short in duration.  No further episodes of gross hematuria since her last visit.  She does complain of fatigue.  No dysuria.  Portions of the above documentation were copied from a prior visit for review purposes only.   Past Medical History:  Past Medical History:  Diagnosis Date   Arthritis    Bladder spasms    takes oxybutinin   Breast cancer of upper-outer quadrant of left female breast (HCC) 06/12/2018   Depression    situational   Dizziness    Headache(784.0)    Hypertension    Memory difficulties 12/31/2014   Neoplasm of brain (HCC)    Pneumonia    hx   Recurrent sinus infections    just finished erythromycin dosing 06/18/11   Rheumatoid arthritis (HCC)    UTI (lower urinary tract infection)    Vision changes    r/t brain tumor    Past Surgical History:  Past Surgical History:  Procedure Laterality Date   ABDOMINAL HYSTERECTOMY     APPENDECTOMY     BIOPSY  08/11/2021   Procedure: BIOPSY;  Surgeon: Dolores Frame, MD;  [facility];  Service: Gastroenterology;;   BREAST LUMPECTOMY WITH RADIOACTIVE SEED AND SENTINEL LYMPH NODE BIOPSY Left 06/12/2018   Procedure: LEFT BREAST LUMPECTOMY WITH RADIOACTIVE SEED AND LEFT AXILLARY DEEP SENTINEL LYMPH NODE BIOPSY INJECT BLUE DYE LEFT BREAST;  Surgeon: Claud Kelp, MD;  Location: [facility];  Service: General;  Laterality:  Left;   CRANIOTOMY  06/22/2011   Procedure: CRANIOTOMY TUMOR EXCISION;  Surgeon: Dorian Heckle, MD;  Location: [facility];  Service: Neurosurgery;  Laterality: Right;  RIGHT pterional Craniotomy for meningioma   CRANIOTOMY  06/22/2011   Procedure: CRANIOTOMY HEMATOMA EVACUATION SUBDURAL;  Surgeon: Dorian Heckle, MD;  Location: [facility];  Service: Neurosurgery;  Laterality: Right;   CYSTECTOMY     L wrist   ESOPHAGOGASTRODUODENOSCOPY (EGD) WITH PROPOFOL N/A 08/11/2021   Procedure:  ESOPHAGOGASTRODUODENOSCOPY (EGD) WITH PROPOFOL;  Surgeon: Dolores Frame, MD;  [facility]: [facility];  Service: Gastroenterology;  Laterality: N/A;  1215   EYE SURGERY  06/08/1975   retina repair   GANGLION CYST EXCISION Left 1978   left wrist   PORTACATH PLACEMENT Right 06/12/2018   Procedure: INSERTION PORT-A-CATH;  Surgeon: Claud Kelp, MD;  Location: [facility];  Service: General;  Laterality: Right;   STAPEDES SURGERY     repalced stapes   TOTAL KNEE ARTHROPLASTY     R   TOTAL KNEE ARTHROPLASTY Left 03/05/2014   Procedure: TOTAL KNEE ARTHROPLASTY;  Surgeon: Valeria Batman, MD;  Location: [facility];  Service: Orthopedics;  Laterality: Left;    Allergies:  Allergies  Allergen Reactions   Methocarbamol Other (See Comments)    Other reaction(s): Other (See Comments) "sedated"     Morphine And Related Other (See Comments)    Pt says it felt like her head was coming off.    Family History:  Family History  Problem Relation Age of Onset   Colon cancer Mother    Stroke Father    Healthy Brother    Cancer - Lung Sister    Heart attack Paternal Aunt    Heart attack Paternal Uncle    Heart attack Paternal Grandmother    Heart attack Paternal Grandfather     Social History:  Social History   Tobacco Use   Smoking status: Never   Smokeless tobacco: Never  Vaping Use   Vaping Use: Never used  Substance Use Topics   Alcohol use: No   Drug use: No    ROS: Constitutional:  Negative for fever, chills, weight loss CV: Negative for chest pain, previous MI, hypertension Respiratory:  Negative for shortness of breath, wheezing, sleep apnea, frequent cough GI:  Negative for nausea, vomiting, bloody stool, GERD  Physical exam: BP 128/71   Pulse 70  GENERAL APPEARANCE:  Well appearing, well developed, well nourished, NAD HEENT:  Atraumatic, normocephalic, oropharynx clear NECK:  Supple without lymphadenopathy or thyromegaly ABDOMEN:  Soft,  non-tender, no masses EXTREMITIES:  Moves all extremities well, without clubbing, cyanosis, or edema NEUROLOGIC:  Alert and oriented x 3, normal gait, CN II-XII grossly intact MENTAL STATUS:  appropriate BACK:  Non-tender to palpation, No CVAT SKIN:  Warm, dry, and intact  Results: U/A: 11-30 RBC, mod bacteria, nitrite negative

## 2021-12-01 ENCOUNTER — Other Ambulatory Visit: Payer: Medicare Other

## 2021-12-01 DIAGNOSIS — R310 Gross hematuria: Secondary | ICD-10-CM | POA: Diagnosis not present

## 2021-12-01 DIAGNOSIS — N170 Acute kidney failure with tubular necrosis: Secondary | ICD-10-CM | POA: Diagnosis not present

## 2021-12-01 DIAGNOSIS — R82998 Other abnormal findings in urine: Secondary | ICD-10-CM | POA: Diagnosis not present

## 2021-12-03 ENCOUNTER — Ambulatory Visit: Payer: Medicare Other | Admitting: Urology

## 2021-12-09 ENCOUNTER — Other Ambulatory Visit: Payer: Self-pay | Admitting: Urology

## 2021-12-10 ENCOUNTER — Other Ambulatory Visit: Payer: Self-pay | Admitting: Urology

## 2021-12-10 DIAGNOSIS — R310 Gross hematuria: Secondary | ICD-10-CM

## 2021-12-11 ENCOUNTER — Telehealth: Payer: Self-pay

## 2021-12-11 NOTE — Telephone Encounter (Signed)
-----   Message from Primus Bravo, MD sent at 12/10/2021  1:44 PM EDT ----- Please notify Mrs. Russ that the hematuria profile shows evidence of a kidney source for her hematuria.  I have made a referral to nephrology for further evaluation. Please have her contact the office if she does not hear about this referral in approximately 1 week.

## 2021-12-11 NOTE — Telephone Encounter (Signed)
Patient made aware and voiced understanding.

## 2021-12-16 LAB — URINALYSIS, ROUTINE W REFLEX MICROSCOPIC
Bilirubin, UA: NEGATIVE
Glucose, UA: NEGATIVE
Ketones, UA: NEGATIVE
Leukocytes,UA: NEGATIVE
Nitrite, UA: NEGATIVE
Specific Gravity, UA: 1.015 (ref 1.005–1.030)
Urobilinogen, Ur: 0.2 mg/dL (ref 0.2–1.0)
pH, UA: 7 (ref 5.0–7.5)

## 2021-12-16 LAB — MICROSCOPIC EXAMINATION: Renal Epithel, UA: NONE SEEN /hpf

## 2021-12-18 ENCOUNTER — Telehealth: Payer: Self-pay

## 2021-12-18 NOTE — Telephone Encounter (Signed)
Patient called advising that she wanted to know why she was being referred to a kidney specialist. I advised patient the referral was sent and they would be contacting her she just wanted the exact diagnosis.

## 2021-12-18 NOTE — Telephone Encounter (Signed)
Called patient and she states she has already spoke to someone at [facility], nothing further needed.

## 2021-12-28 ENCOUNTER — Telehealth: Payer: Self-pay

## 2021-12-28 NOTE — Telephone Encounter (Signed)
Patient still has not heard from [facility], she was told they rate their patients from 1-4 depending on urgency and so based on her diagnosis they have not scheduled her apt yet.  They told her it could be between 2-3 more weeks before they can get her in.  She was not sure if she needed to be seen before then or if she needed another referral.  Verbal from Dr. Felipa Eth ok to wait 2-3 weeks for apt unless she would prefer to be seen somewhere else.  She is ok to wait 2-3 weeks to see if she can get an apt with [facility], if not she will call back for a referral elsewhere.

## 2022-01-12 DIAGNOSIS — H04123 Dry eye syndrome of bilateral lacrimal glands: Secondary | ICD-10-CM | POA: Diagnosis not present

## 2022-01-26 ENCOUNTER — Ambulatory Visit (HOSPITAL_COMMUNITY)
Admission: RE | Admit: 2022-01-26 | Discharge: 2022-01-26 | Disposition: A | Discharge status: Home / Self Care | Payer: Medicare Other | Source: Ambulatory Visit | Attending: Urology | Admitting: Urology

## 2022-01-26 DIAGNOSIS — N1330 Unspecified hydronephrosis: Secondary | ICD-10-CM | POA: Diagnosis not present

## 2022-01-28 ENCOUNTER — Encounter: Payer: Self-pay | Admitting: Urology

## 2022-01-28 ENCOUNTER — Ambulatory Visit (INDEPENDENT_AMBULATORY_CARE_PROVIDER_SITE_OTHER): Payer: Medicare Other | Admitting: Urology

## 2022-01-28 VITALS — BP 135/76 | HR 55

## 2022-01-28 DIAGNOSIS — I10 Essential (primary) hypertension: Secondary | ICD-10-CM | POA: Diagnosis not present

## 2022-01-28 DIAGNOSIS — R310 Gross hematuria: Secondary | ICD-10-CM | POA: Diagnosis not present

## 2022-01-28 DIAGNOSIS — E559 Vitamin D deficiency, unspecified: Secondary | ICD-10-CM | POA: Diagnosis not present

## 2022-01-28 DIAGNOSIS — E538 Deficiency of other specified B group vitamins: Secondary | ICD-10-CM | POA: Diagnosis not present

## 2022-01-28 DIAGNOSIS — R5383 Other fatigue: Secondary | ICD-10-CM | POA: Diagnosis not present

## 2022-01-28 DIAGNOSIS — Z299 Encounter for prophylactic measures, unspecified: Secondary | ICD-10-CM | POA: Diagnosis not present

## 2022-01-28 LAB — URINALYSIS, ROUTINE W REFLEX MICROSCOPIC
Bilirubin, UA: NEGATIVE
Glucose, UA: NEGATIVE
Ketones, UA: NEGATIVE
Leukocytes,UA: NEGATIVE
Nitrite, UA: NEGATIVE
Specific Gravity, UA: 1.01 (ref 1.005–1.030)
Urobilinogen, Ur: 0.2 mg/dL (ref 0.2–1.0)
pH, UA: 7 (ref 5.0–7.5)

## 2022-01-28 LAB — MICROSCOPIC EXAMINATION
Epithelial Cells (non renal): >10 /hpf — AB (ref 0–10)
Renal Epithel, UA: NONE SEEN /hpf

## 2022-01-28 NOTE — Progress Notes (Signed)
Assessment: 1. Gross hematuria; negative urologic evaluation 6/23; nephrologic in origin     Plan: I personally reviewed the ultrasound study from 01/26/2022 showing a extrarenal pelvis without evidence of obstruction.  Review of the CT study from 11/05/2021 showed the extrarenal pelvis without evidence of obstruction as contrast was seen in the ureter on delayed imaging. Her hematuria appears to be of renal origin. Awaiting nephrology evaluation.  Referral to Dr. Theador Hawthorne. Recommend evaluation by her PCP for her dizziness and fatigue. Return to office in 3 months.  Chief Complaint:  Chief Complaint  Patient presents with   Hematuria    History of Present Illness:  Jacqueline Christian is a 76 y.o. year old female who is seen for further evaluation of hematuria and right hydronephrosis.  She had onset of right lower quadrant and right flank pain in early May 2023.  This was associated with nausea and vomiting.  No dysuria or gross hematuria at that time.  She was evaluated in the emergency room in [place]. CT abdomen and pelvis without contrast from 10/10/2021 demonstrated moderate right hydronephrosis progressed since CT from 07/08/2021, no renal or ureteral calculi, mild left renal parenchymal atrophy. Urinalysis from 10/09/2021 demonstrated 25 RBCs, 25 WBCs, and nitrite positive.  Urine culture showed no growth. She followed up with her PCP and was placed on antibiotics for UTI.  She completed the antibiotics.  She reports a repeat urinalysis at her PCP which did not show evidence of infection but did show evidence of blood.  She reported an episode of gross hematuria on 10/17/21.  Her abdominal and flank pain improved.  No prior history of kidney stones.  No recent UTIs.  No history of tobacco use.  CT from 07/08/2021 showed bilateral extrarenal pelves, no renal or ureteral calculi.  She has baseline symptoms of urinary frequency and urgency.  No urinary incontinence.  She has been managed with Ditropan  XL 5 mg daily.  CT hematuria protocol from 11/05/2021 showed mild right hydronephrosis and hydroureter decreased as compared to prior examination, no ureteral or renal calculi, no renal mass, no filling defect noted. Cystoscopy from 11/06/2021 showed a normal urethra and bladder. Urine cytology was negative for malignancy. At her visit on 11/27/21, she reported no further flank or abdominal pain.  No back pain.  She reported occasional episodes of right lower quadrant pain, very short in duration.  No further episodes of gross hematuria or dysuria. Hematuria profile sent on 12/01/2021 showed no malignant cells or dysplasia, blood and/or erythrocytic casts indicating glomerular or renal tubular bleeding, severe renal tubular injury and ischemic necrosis consistent with acute tubular necrosis. Referral to nephrology was made given these findings.  Renal ultrasound from 01/26/2022 shows a right extrarenal pelvis without evidence of caliectasis and a normal left kidney.  She returns today for follow-up.  She has not had any episodes of gross hematuria or flank pain.  She is having symptoms of fatigue and dizziness.  Portions of the above documentation were copied from a prior visit for review purposes only.   Past Medical History:  Past Medical History:  Diagnosis Date   Arthritis    Bladder spasms    takes oxybutinin   Breast cancer of upper-outer quadrant of left female breast ([place]) 06/12/2018   Depression    situational   Dizziness    Headache(784.0)    Hypertension    Memory difficulties 12/31/2014   Neoplasm of brain ([place])    Pneumonia    hx  Recurrent sinus infections    just finished erythromycin dosing 06/18/11   Rheumatoid arthritis ([place])    UTI (lower urinary tract infection)    Vision changes    r/t brain tumor    Past Surgical History:  Past Surgical History:  Procedure Laterality Date   ABDOMINAL HYSTERECTOMY     APPENDECTOMY     BIOPSY  08/11/2021   Procedure: BIOPSY;   Surgeon: Harvel Quale, MD;  [facility];  Service: Gastroenterology;;   BREAST LUMPECTOMY WITH RADIOACTIVE SEED AND SENTINEL LYMPH NODE BIOPSY Left 06/12/2018   Procedure: LEFT BREAST LUMPECTOMY WITH RADIOACTIVE SEED AND LEFT AXILLARY DEEP SENTINEL LYMPH NODE BIOPSY INJECT BLUE DYE LEFT BREAST;  Surgeon: Fanny Skates, MD;  Location: [facility];  Service: General;  Laterality: Left;   CRANIOTOMY  06/22/2011   Procedure: CRANIOTOMY TUMOR EXCISION;  Surgeon: Peggyann Shoals, MD;  Location: [facility];  Service: Neurosurgery;  Laterality: Right;  RIGHT pterional Craniotomy for meningioma   CRANIOTOMY  06/22/2011   Procedure: CRANIOTOMY HEMATOMA EVACUATION SUBDURAL;  Surgeon: Peggyann Shoals, MD;  Location: [facility];  Service: Neurosurgery;  Laterality: Right;   CYSTECTOMY     L wrist   ESOPHAGOGASTRODUODENOSCOPY (EGD) WITH PROPOFOL N/A 08/11/2021   Procedure: ESOPHAGOGASTRODUODENOSCOPY (EGD) WITH PROPOFOL;  Surgeon: Harvel Quale, MD;  [facility];  Service: Gastroenterology;  Laterality: N/A;  1215   EYE SURGERY  06/08/1975   retina repair   GANGLION CYST EXCISION Left 1978   left wrist   PORTACATH PLACEMENT Right 06/12/2018   Procedure: INSERTION PORT-A-CATH;  Surgeon: Fanny Skates, MD;  Location: [facility];  Service: General;  Laterality: Right;   STAPEDES SURGERY     repalced stapes   TOTAL KNEE ARTHROPLASTY     R   TOTAL KNEE ARTHROPLASTY Left 03/05/2014   Procedure: TOTAL KNEE ARTHROPLASTY;  Surgeon: Garald Balding, MD;  Location: [facility];  Service: Orthopedics;  Laterality: Left;    Allergies:  Allergies  Allergen Reactions   Methocarbamol Other (See Comments)    Other reaction(s): Other (See Comments) "sedated"     Morphine And Related Other (See Comments)    Pt says it felt like her head was coming off.    Family History:  Family History  Problem Relation Age of Onset   Colon cancer  Mother    Stroke Father    Healthy Brother    Cancer - Lung Sister    Heart attack Paternal Aunt    Heart attack Paternal Uncle    Heart attack Paternal Grandmother    Heart attack Paternal Grandfather     Social History:  Social History   Tobacco Use   Smoking status: Never   Smokeless tobacco: Never  Vaping Use   Vaping Use: Never used  Substance Use Topics   Alcohol use: No   Drug use: No    ROS: Constitutional:  Negative for fever, chills, weight loss CV: Negative for chest pain, previous MI, hypertension Respiratory:  Negative for shortness of breath, wheezing, sleep apnea, frequent cough GI:  Negative for nausea, vomiting, bloody stool, GERD  Physical exam: BP 135/76   Pulse (!) 55  GENERAL APPEARANCE:  Well appearing, well developed, well nourished, NAD HEENT:  Atraumatic, normocephalic, oropharynx clear NECK:  Supple without lymphadenopathy or thyromegaly ABDOMEN:  Soft, non-tender, no masses EXTREMITIES:  Moves all extremities well, without clubbing, cyanosis, or edema NEUROLOGIC:  Alert and oriented x 3, normal gait,  CN II-XII grossly intact MENTAL STATUS:  appropriate BACK:  Non-tender to palpation, No CVAT SKIN:  Warm, dry, and intact  Results: U/A: 3-10 RBC, 0-5 WBC, >10 epithelial cells, few bacteria

## 2022-02-25 DIAGNOSIS — R21 Rash and other nonspecific skin eruption: Secondary | ICD-10-CM | POA: Diagnosis not present

## 2022-02-25 DIAGNOSIS — Z789 Other specified health status: Secondary | ICD-10-CM | POA: Diagnosis not present

## 2022-02-25 DIAGNOSIS — I10 Essential (primary) hypertension: Secondary | ICD-10-CM | POA: Diagnosis not present

## 2022-02-25 DIAGNOSIS — Z299 Encounter for prophylactic measures, unspecified: Secondary | ICD-10-CM | POA: Diagnosis not present

## 2022-02-25 DIAGNOSIS — Z23 Encounter for immunization: Secondary | ICD-10-CM | POA: Diagnosis not present

## 2022-03-08 ENCOUNTER — Telehealth: Payer: Self-pay | Admitting: *Deleted

## 2022-03-08 NOTE — Chronic Care Management (AMB) (Signed)
  Care Coordination   Note   03/08/2022 Name: Jacqueline Christian MRN: [id number] DOB: 1945-12-22  Jacqueline Christian is a 76 y.o. year old female who sees Vyas, Dhruv B, MD for primary care. I reached out to [employer] by phone today to offer care coordination services.  Jacqueline Christian was given information about Care Coordination services today including:   The Care Coordination services include support from the care team which includes your Nurse Coordinator, Clinical Social Worker, or Pharmacist.  The Care Coordination team is here to help remove barriers to the health concerns and goals most important to you. Care Coordination services are voluntary, and the patient may decline or stop services at any time by request to their care team member.   Care Coordination Consent Status: Patient agreed to services and verbal consent obtained.   Follow up plan:  Telephone appointment with care coordination team member scheduled for:  03/18/22  Encounter Outcome:  Pt. Scheduled  [place]  Direct Dial: [phone]

## 2022-03-11 DIAGNOSIS — R319 Hematuria, unspecified: Secondary | ICD-10-CM | POA: Diagnosis not present

## 2022-03-11 DIAGNOSIS — Z7189 Other specified counseling: Secondary | ICD-10-CM | POA: Diagnosis not present

## 2022-03-11 DIAGNOSIS — N1330 Unspecified hydronephrosis: Secondary | ICD-10-CM | POA: Diagnosis not present

## 2022-03-11 DIAGNOSIS — E876 Hypokalemia: Secondary | ICD-10-CM | POA: Diagnosis not present

## 2022-03-11 DIAGNOSIS — M797 Fibromyalgia: Secondary | ICD-10-CM | POA: Diagnosis not present

## 2022-03-11 DIAGNOSIS — N1831 Chronic kidney disease, stage 3a: Secondary | ICD-10-CM | POA: Diagnosis not present

## 2022-03-11 DIAGNOSIS — Z86018 Personal history of other benign neoplasm: Secondary | ICD-10-CM | POA: Diagnosis not present

## 2022-03-11 DIAGNOSIS — R809 Proteinuria, unspecified: Secondary | ICD-10-CM | POA: Diagnosis not present

## 2022-03-11 DIAGNOSIS — C50412 Malignant neoplasm of upper-outer quadrant of left female breast: Secondary | ICD-10-CM | POA: Diagnosis not present

## 2022-03-11 DIAGNOSIS — R9341 Abnormal radiologic findings on diagnostic imaging of renal pelvis, ureter, or bladder: Secondary | ICD-10-CM | POA: Diagnosis not present

## 2022-03-18 ENCOUNTER — Other Ambulatory Visit (HOSPITAL_COMMUNITY): Payer: Self-pay | Admitting: Nephrology

## 2022-03-18 ENCOUNTER — Ambulatory Visit: Payer: Self-pay | Admitting: *Deleted

## 2022-03-18 DIAGNOSIS — N1831 Chronic kidney disease, stage 3a: Secondary | ICD-10-CM

## 2022-03-18 DIAGNOSIS — R809 Proteinuria, unspecified: Secondary | ICD-10-CM

## 2022-03-18 NOTE — Patient Outreach (Signed)
  Care Coordination   03/18/2022 Name: Jacqueline Christian MRN: [id number] DOB: July 31, 1945   Care Coordination Outreach Attempts:  An unsuccessful telephone outreach was attempted for a scheduled appointment today.  Follow Up Plan:  Additional outreach attempts will be made to offer the patient care coordination information and services.   Encounter Outcome:  No Answer. Left HIPAA compliant voicemail to return my call today if she has any urgent questions or concerns. I will forward to scheduling care guide to reach out and reschedule her initial CC appointment with Valente David, RN Care Coordinator [phone])   Care Coordination Interventions Activated:  No   Care Coordination Interventions:  No, not indicated    Chong Sicilian, BSN, RN-BC RN Care Coordinator [place]: [phone] Main #: [phone]

## 2022-03-24 DIAGNOSIS — R809 Proteinuria, unspecified: Secondary | ICD-10-CM | POA: Diagnosis not present

## 2022-03-24 DIAGNOSIS — C50412 Malignant neoplasm of upper-outer quadrant of left female breast: Secondary | ICD-10-CM | POA: Diagnosis not present

## 2022-03-24 DIAGNOSIS — Z86018 Personal history of other benign neoplasm: Secondary | ICD-10-CM | POA: Diagnosis not present

## 2022-03-24 DIAGNOSIS — E876 Hypokalemia: Secondary | ICD-10-CM | POA: Diagnosis not present

## 2022-03-24 DIAGNOSIS — R319 Hematuria, unspecified: Secondary | ICD-10-CM | POA: Diagnosis not present

## 2022-03-24 DIAGNOSIS — N1831 Chronic kidney disease, stage 3a: Secondary | ICD-10-CM | POA: Diagnosis not present

## 2022-03-24 DIAGNOSIS — N1330 Unspecified hydronephrosis: Secondary | ICD-10-CM | POA: Diagnosis not present

## 2022-03-24 DIAGNOSIS — R9341 Abnormal radiologic findings on diagnostic imaging of renal pelvis, ureter, or bladder: Secondary | ICD-10-CM | POA: Diagnosis not present

## 2022-03-25 DIAGNOSIS — I10 Essential (primary) hypertension: Secondary | ICD-10-CM | POA: Diagnosis not present

## 2022-03-25 DIAGNOSIS — R319 Hematuria, unspecified: Secondary | ICD-10-CM | POA: Diagnosis not present

## 2022-03-25 DIAGNOSIS — Z299 Encounter for prophylactic measures, unspecified: Secondary | ICD-10-CM | POA: Diagnosis not present

## 2022-03-25 DIAGNOSIS — N390 Urinary tract infection, site not specified: Secondary | ICD-10-CM | POA: Diagnosis not present

## 2022-03-25 DIAGNOSIS — R350 Frequency of micturition: Secondary | ICD-10-CM | POA: Diagnosis not present

## 2022-03-26 ENCOUNTER — Ambulatory Visit (HOSPITAL_COMMUNITY)
Admission: RE | Admit: 2022-03-26 | Discharge: 2022-03-26 | Disposition: A | Discharge status: Home / Self Care | Payer: Medicare Other | Source: Ambulatory Visit | Attending: Nephrology | Admitting: Nephrology

## 2022-03-26 DIAGNOSIS — N3289 Other specified disorders of bladder: Secondary | ICD-10-CM | POA: Diagnosis not present

## 2022-03-26 DIAGNOSIS — R809 Proteinuria, unspecified: Secondary | ICD-10-CM | POA: Diagnosis not present

## 2022-03-26 DIAGNOSIS — N189 Chronic kidney disease, unspecified: Secondary | ICD-10-CM | POA: Diagnosis not present

## 2022-03-26 DIAGNOSIS — R188 Other ascites: Secondary | ICD-10-CM | POA: Diagnosis not present

## 2022-03-26 DIAGNOSIS — N1831 Chronic kidney disease, stage 3a: Secondary | ICD-10-CM | POA: Insufficient documentation

## 2022-03-31 DIAGNOSIS — Z09 Encounter for follow-up examination after completed treatment for conditions other than malignant neoplasm: Secondary | ICD-10-CM | POA: Diagnosis not present

## 2022-03-31 DIAGNOSIS — T451X5A Adverse effect of antineoplastic and immunosuppressive drugs, initial encounter: Secondary | ICD-10-CM | POA: Diagnosis not present

## 2022-03-31 DIAGNOSIS — C50412 Malignant neoplasm of upper-outer quadrant of left female breast: Secondary | ICD-10-CM | POA: Diagnosis not present

## 2022-03-31 DIAGNOSIS — Z780 Asymptomatic menopausal state: Secondary | ICD-10-CM | POA: Diagnosis not present

## 2022-03-31 DIAGNOSIS — M8580 Other specified disorders of bone density and structure, unspecified site: Secondary | ICD-10-CM | POA: Diagnosis not present

## 2022-03-31 DIAGNOSIS — E559 Vitamin D deficiency, unspecified: Secondary | ICD-10-CM | POA: Diagnosis not present

## 2022-03-31 DIAGNOSIS — G620 Drug-induced polyneuropathy: Secondary | ICD-10-CM | POA: Diagnosis not present

## 2022-03-31 DIAGNOSIS — Z170 Estrogen receptor positive status [ER+]: Secondary | ICD-10-CM | POA: Diagnosis not present

## 2022-04-01 ENCOUNTER — Encounter: Payer: Self-pay | Admitting: *Deleted

## 2022-04-01 ENCOUNTER — Ambulatory Visit: Payer: Self-pay | Admitting: *Deleted

## 2022-04-01 NOTE — Patient Instructions (Signed)
Visit Information  Thank you for taking time to visit with me today. Please don't hesitate to contact me if I can be of assistance to you.  Following are the goals we discussed today:  Call nephrology office to discuss test results and report ongoing signs of infection.   Patient verbalizes understanding of instructions and care plan provided today and agrees to view in [place]. Active MyChart status and patient understanding of how to access instructions and care plan via MyChart confirmed with patient.     The patient has been provided with contact information for the care management team and has been advised to call with any health related questions or concerns.   Valente David, RN, MSN, [place] Care Management Care Management Coordinator [phone]

## 2022-04-01 NOTE — Patient Outreach (Signed)
  Care Coordination   Initial Visit Note   04/01/2022 Name: Jacqueline Christian MRN: [id number] DOB: [birth year]  Jacqueline Christian is a 76 y.o. year old female who sees Vyas, Dhruv B, MD for primary care. I spoke with  Jacqueline Christian by phone today.  What matters to the patients health and wellness today?  Member has had recurrent UTIs.  Continues to have pain and discomfort, but denies any urgent concerns.     Goals Addressed             This Visit's Progress    Care Coordination Activities       Care Coordination Interventions: Assessment of understanding of urinary tract infection diagnosis Education: Basic overview and discussion of urinary tract infections with patient Medications reviewed including antibiotic therapy Discussed recent testing (blood work and ultrasound) done in the past couple weeks Encouraged to call nephrology office to follow up on test results and to report ongoing symptoms of UTI despite recent course of Cipro. Reviewed next appointment with nephrology scheduled for 11/16 and urology on 11/30         SDOH assessments and interventions completed:  Yes  SDOH Interventions Today    Flowsheet Row Most Recent Value  SDOH Interventions   Food Insecurity Interventions Intervention Not Indicated  Housing Interventions Intervention Not Indicated  Transportation Interventions Intervention Not Indicated  Utilities Interventions Intervention Not Indicated        Care Coordination Interventions Activated:  Yes  Care Coordination Interventions:  Yes, provided   Follow up plan:  Member will call when ready for follow up or when she has questions.     Encounter Outcome:  Pt. Visit Completed   Valente David, RN, MSN, [place] Care Management Care Management Coordinator [phone]

## 2022-04-05 DIAGNOSIS — R319 Hematuria, unspecified: Secondary | ICD-10-CM | POA: Diagnosis not present

## 2022-04-05 DIAGNOSIS — N182 Chronic kidney disease, stage 2 (mild): Secondary | ICD-10-CM | POA: Diagnosis not present

## 2022-04-05 DIAGNOSIS — R809 Proteinuria, unspecified: Secondary | ICD-10-CM | POA: Diagnosis not present

## 2022-04-05 DIAGNOSIS — C50412 Malignant neoplasm of upper-outer quadrant of left female breast: Secondary | ICD-10-CM | POA: Diagnosis not present

## 2022-04-05 DIAGNOSIS — R8281 Pyuria: Secondary | ICD-10-CM | POA: Diagnosis not present

## 2022-04-05 DIAGNOSIS — R9341 Abnormal radiologic findings on diagnostic imaging of renal pelvis, ureter, or bladder: Secondary | ICD-10-CM | POA: Diagnosis not present

## 2022-05-04 DIAGNOSIS — R42 Dizziness and giddiness: Secondary | ICD-10-CM | POA: Diagnosis not present

## 2022-05-04 DIAGNOSIS — Z299 Encounter for prophylactic measures, unspecified: Secondary | ICD-10-CM | POA: Diagnosis not present

## 2022-05-04 DIAGNOSIS — R6889 Other general symptoms and signs: Secondary | ICD-10-CM | POA: Diagnosis not present

## 2022-05-06 ENCOUNTER — Ambulatory Visit: Payer: Medicare Other | Admitting: Urology

## 2022-05-11 DIAGNOSIS — I10 Essential (primary) hypertension: Secondary | ICD-10-CM | POA: Diagnosis not present

## 2022-05-11 DIAGNOSIS — F339 Major depressive disorder, recurrent, unspecified: Secondary | ICD-10-CM | POA: Diagnosis not present

## 2022-05-11 DIAGNOSIS — Z299 Encounter for prophylactic measures, unspecified: Secondary | ICD-10-CM | POA: Diagnosis not present

## 2022-05-11 DIAGNOSIS — J329 Chronic sinusitis, unspecified: Secondary | ICD-10-CM | POA: Diagnosis not present

## 2022-05-20 DIAGNOSIS — R0981 Nasal congestion: Secondary | ICD-10-CM | POA: Diagnosis not present

## 2022-05-20 DIAGNOSIS — J069 Acute upper respiratory infection, unspecified: Secondary | ICD-10-CM | POA: Diagnosis not present

## 2022-05-20 DIAGNOSIS — Z299 Encounter for prophylactic measures, unspecified: Secondary | ICD-10-CM | POA: Diagnosis not present

## 2022-05-21 DIAGNOSIS — H43813 Vitreous degeneration, bilateral: Secondary | ICD-10-CM | POA: Diagnosis not present

## 2022-05-24 DIAGNOSIS — R5383 Other fatigue: Secondary | ICD-10-CM | POA: Diagnosis not present

## 2022-05-24 DIAGNOSIS — J069 Acute upper respiratory infection, unspecified: Secondary | ICD-10-CM | POA: Diagnosis not present

## 2022-06-01 DIAGNOSIS — Z7189 Other specified counseling: Secondary | ICD-10-CM | POA: Diagnosis not present

## 2022-06-01 DIAGNOSIS — R5383 Other fatigue: Secondary | ICD-10-CM | POA: Diagnosis not present

## 2022-06-01 DIAGNOSIS — J069 Acute upper respiratory infection, unspecified: Secondary | ICD-10-CM | POA: Diagnosis not present

## 2022-06-01 DIAGNOSIS — Z6827 Body mass index (BMI) 27.0-27.9, adult: Secondary | ICD-10-CM | POA: Diagnosis not present

## 2022-06-01 DIAGNOSIS — Z0000 Encounter for general adult medical examination without abnormal findings: Secondary | ICD-10-CM | POA: Diagnosis not present

## 2022-06-01 DIAGNOSIS — Z789 Other specified health status: Secondary | ICD-10-CM | POA: Diagnosis not present

## 2022-06-01 DIAGNOSIS — E7800 Pure hypercholesterolemia, unspecified: Secondary | ICD-10-CM | POA: Diagnosis not present

## 2022-06-01 DIAGNOSIS — Z1331 Encounter for screening for depression: Secondary | ICD-10-CM | POA: Diagnosis not present

## 2022-06-01 DIAGNOSIS — I10 Essential (primary) hypertension: Secondary | ICD-10-CM | POA: Diagnosis not present

## 2022-06-01 DIAGNOSIS — Z299 Encounter for prophylactic measures, unspecified: Secondary | ICD-10-CM | POA: Diagnosis not present

## 2022-06-02 DIAGNOSIS — R5383 Other fatigue: Secondary | ICD-10-CM | POA: Diagnosis not present

## 2022-06-02 DIAGNOSIS — E7800 Pure hypercholesterolemia, unspecified: Secondary | ICD-10-CM | POA: Diagnosis not present

## 2022-06-02 DIAGNOSIS — Z79899 Other long term (current) drug therapy: Secondary | ICD-10-CM | POA: Diagnosis not present

## 2022-06-03 DIAGNOSIS — Z1231 Encounter for screening mammogram for malignant neoplasm of breast: Secondary | ICD-10-CM | POA: Diagnosis not present

## 2022-06-03 DIAGNOSIS — Z170 Estrogen receptor positive status [ER+]: Secondary | ICD-10-CM | POA: Diagnosis not present

## 2022-06-03 DIAGNOSIS — C50412 Malignant neoplasm of upper-outer quadrant of left female breast: Secondary | ICD-10-CM | POA: Diagnosis not present

## 2022-07-01 DIAGNOSIS — Z09 Encounter for follow-up examination after completed treatment for conditions other than malignant neoplasm: Secondary | ICD-10-CM | POA: Diagnosis not present

## 2022-07-01 DIAGNOSIS — C50412 Malignant neoplasm of upper-outer quadrant of left female breast: Secondary | ICD-10-CM | POA: Diagnosis not present

## 2022-07-22 DIAGNOSIS — Z171 Estrogen receptor negative status [ER-]: Secondary | ICD-10-CM | POA: Diagnosis not present

## 2022-07-22 DIAGNOSIS — L305 Pityriasis alba: Secondary | ICD-10-CM | POA: Diagnosis not present

## 2022-07-22 DIAGNOSIS — C50212 Malignant neoplasm of upper-inner quadrant of left female breast: Secondary | ICD-10-CM | POA: Diagnosis not present

## 2022-11-02 DIAGNOSIS — I951 Orthostatic hypotension: Secondary | ICD-10-CM | POA: Diagnosis not present

## 2022-11-02 DIAGNOSIS — Z299 Encounter for prophylactic measures, unspecified: Secondary | ICD-10-CM | POA: Diagnosis not present

## 2022-11-02 DIAGNOSIS — I10 Essential (primary) hypertension: Secondary | ICD-10-CM | POA: Diagnosis not present

## 2022-11-25 DIAGNOSIS — N1330 Unspecified hydronephrosis: Secondary | ICD-10-CM | POA: Diagnosis not present

## 2022-11-25 DIAGNOSIS — Z79899 Other long term (current) drug therapy: Secondary | ICD-10-CM | POA: Diagnosis not present

## 2022-11-25 DIAGNOSIS — R319 Hematuria, unspecified: Secondary | ICD-10-CM | POA: Diagnosis not present

## 2022-11-25 DIAGNOSIS — N1831 Chronic kidney disease, stage 3a: Secondary | ICD-10-CM | POA: Diagnosis not present

## 2022-11-25 DIAGNOSIS — N189 Chronic kidney disease, unspecified: Secondary | ICD-10-CM | POA: Diagnosis not present

## 2022-11-25 DIAGNOSIS — E876 Hypokalemia: Secondary | ICD-10-CM | POA: Diagnosis not present

## 2022-12-03 DIAGNOSIS — I129 Hypertensive chronic kidney disease with stage 1 through stage 4 chronic kidney disease, or unspecified chronic kidney disease: Secondary | ICD-10-CM | POA: Diagnosis not present

## 2022-12-03 DIAGNOSIS — N182 Chronic kidney disease, stage 2 (mild): Secondary | ICD-10-CM | POA: Diagnosis not present

## 2022-12-03 DIAGNOSIS — N139 Obstructive and reflux uropathy, unspecified: Secondary | ICD-10-CM | POA: Diagnosis not present

## 2022-12-03 DIAGNOSIS — R809 Proteinuria, unspecified: Secondary | ICD-10-CM | POA: Diagnosis not present

## 2023-01-21 DIAGNOSIS — Z171 Estrogen receptor negative status [ER-]: Secondary | ICD-10-CM | POA: Diagnosis not present

## 2023-01-21 DIAGNOSIS — C50212 Malignant neoplasm of upper-inner quadrant of left female breast: Secondary | ICD-10-CM | POA: Diagnosis not present

## 2023-01-21 DIAGNOSIS — L305 Pityriasis alba: Secondary | ICD-10-CM | POA: Diagnosis not present

## 2023-01-24 DIAGNOSIS — Z171 Estrogen receptor negative status [ER-]: Secondary | ICD-10-CM | POA: Diagnosis not present

## 2023-01-24 DIAGNOSIS — C50212 Malignant neoplasm of upper-inner quadrant of left female breast: Secondary | ICD-10-CM | POA: Diagnosis not present

## 2023-01-26 DIAGNOSIS — R350 Frequency of micturition: Secondary | ICD-10-CM | POA: Diagnosis not present

## 2023-01-26 DIAGNOSIS — Z299 Encounter for prophylactic measures, unspecified: Secondary | ICD-10-CM | POA: Diagnosis not present

## 2023-01-26 DIAGNOSIS — N390 Urinary tract infection, site not specified: Secondary | ICD-10-CM | POA: Diagnosis not present

## 2023-01-26 DIAGNOSIS — I10 Essential (primary) hypertension: Secondary | ICD-10-CM | POA: Diagnosis not present

## 2023-04-19 DIAGNOSIS — Z23 Encounter for immunization: Secondary | ICD-10-CM | POA: Diagnosis not present

## 2023-05-16 ENCOUNTER — Other Ambulatory Visit (HOSPITAL_COMMUNITY)
Admission: RE | Admit: 2023-05-16 | Discharge: 2023-05-16 | Disposition: A | Discharge status: Home / Self Care | Payer: Medicare Other | Source: Ambulatory Visit | Attending: Nephrology | Admitting: Nephrology

## 2023-05-26 DIAGNOSIS — I10 Essential (primary) hypertension: Secondary | ICD-10-CM | POA: Diagnosis not present

## 2023-05-26 DIAGNOSIS — R319 Hematuria, unspecified: Secondary | ICD-10-CM | POA: Diagnosis not present

## 2023-05-26 DIAGNOSIS — R350 Frequency of micturition: Secondary | ICD-10-CM | POA: Diagnosis not present

## 2023-05-26 DIAGNOSIS — N390 Urinary tract infection, site not specified: Secondary | ICD-10-CM | POA: Diagnosis not present

## 2023-05-26 DIAGNOSIS — N8110 Cystocele, unspecified: Secondary | ICD-10-CM | POA: Diagnosis not present

## 2023-05-26 DIAGNOSIS — Z299 Encounter for prophylactic measures, unspecified: Secondary | ICD-10-CM | POA: Diagnosis not present

## 2023-06-07 DIAGNOSIS — Z79899 Other long term (current) drug therapy: Secondary | ICD-10-CM | POA: Diagnosis not present

## 2023-06-07 DIAGNOSIS — Z23 Encounter for immunization: Secondary | ICD-10-CM | POA: Diagnosis not present

## 2023-06-07 DIAGNOSIS — F322 Major depressive disorder, single episode, severe without psychotic features: Secondary | ICD-10-CM | POA: Diagnosis not present

## 2023-06-07 DIAGNOSIS — Z0000 Encounter for general adult medical examination without abnormal findings: Secondary | ICD-10-CM | POA: Diagnosis not present

## 2023-06-07 DIAGNOSIS — Z1331 Encounter for screening for depression: Secondary | ICD-10-CM | POA: Diagnosis not present

## 2023-06-07 DIAGNOSIS — Z299 Encounter for prophylactic measures, unspecified: Secondary | ICD-10-CM | POA: Diagnosis not present

## 2023-06-07 DIAGNOSIS — Z1339 Encounter for screening examination for other mental health and behavioral disorders: Secondary | ICD-10-CM | POA: Diagnosis not present

## 2023-06-07 DIAGNOSIS — I10 Essential (primary) hypertension: Secondary | ICD-10-CM | POA: Diagnosis not present

## 2023-06-07 DIAGNOSIS — E7800 Pure hypercholesterolemia, unspecified: Secondary | ICD-10-CM | POA: Diagnosis not present

## 2023-06-07 DIAGNOSIS — Z7189 Other specified counseling: Secondary | ICD-10-CM | POA: Diagnosis not present

## 2023-06-07 DIAGNOSIS — R5383 Other fatigue: Secondary | ICD-10-CM | POA: Diagnosis not present

## 2023-06-29 DIAGNOSIS — N6452 Nipple discharge: Secondary | ICD-10-CM | POA: Diagnosis not present

## 2023-06-29 DIAGNOSIS — R92323 Mammographic fibroglandular density, bilateral breasts: Secondary | ICD-10-CM | POA: Diagnosis not present

## 2023-06-29 DIAGNOSIS — N6459 Other signs and symptoms in breast: Secondary | ICD-10-CM | POA: Diagnosis not present

## 2023-06-29 DIAGNOSIS — Z853 Personal history of malignant neoplasm of breast: Secondary | ICD-10-CM | POA: Diagnosis not present

## 2023-08-02 DIAGNOSIS — Z171 Estrogen receptor negative status [ER-]: Secondary | ICD-10-CM | POA: Diagnosis not present

## 2023-08-02 DIAGNOSIS — C50212 Malignant neoplasm of upper-inner quadrant of left female breast: Secondary | ICD-10-CM | POA: Diagnosis not present

## 2023-08-09 DIAGNOSIS — C50212 Malignant neoplasm of upper-inner quadrant of left female breast: Secondary | ICD-10-CM | POA: Diagnosis not present

## 2023-08-09 DIAGNOSIS — Z171 Estrogen receptor negative status [ER-]: Secondary | ICD-10-CM | POA: Diagnosis not present

## 2023-08-16 DIAGNOSIS — D692 Other nonthrombocytopenic purpura: Secondary | ICD-10-CM | POA: Diagnosis not present

## 2023-08-16 DIAGNOSIS — Z299 Encounter for prophylactic measures, unspecified: Secondary | ICD-10-CM | POA: Diagnosis not present

## 2023-08-16 DIAGNOSIS — F322 Major depressive disorder, single episode, severe without psychotic features: Secondary | ICD-10-CM | POA: Diagnosis not present

## 2023-08-16 DIAGNOSIS — R0781 Pleurodynia: Secondary | ICD-10-CM | POA: Diagnosis not present

## 2023-08-16 DIAGNOSIS — I10 Essential (primary) hypertension: Secondary | ICD-10-CM | POA: Diagnosis not present

## 2023-08-16 DIAGNOSIS — Z9181 History of falling: Secondary | ICD-10-CM | POA: Diagnosis not present

## 2023-08-16 DIAGNOSIS — I700 Atherosclerosis of aorta: Secondary | ICD-10-CM | POA: Diagnosis not present

## 2023-10-25 DIAGNOSIS — Z299 Encounter for prophylactic measures, unspecified: Secondary | ICD-10-CM | POA: Diagnosis not present

## 2023-10-25 DIAGNOSIS — R42 Dizziness and giddiness: Secondary | ICD-10-CM | POA: Diagnosis not present

## 2023-10-25 DIAGNOSIS — I10 Essential (primary) hypertension: Secondary | ICD-10-CM | POA: Diagnosis not present

## 2023-10-25 DIAGNOSIS — D692 Other nonthrombocytopenic purpura: Secondary | ICD-10-CM | POA: Diagnosis not present

## 2023-10-25 DIAGNOSIS — F322 Major depressive disorder, single episode, severe without psychotic features: Secondary | ICD-10-CM | POA: Diagnosis not present

## 2023-10-25 DIAGNOSIS — I700 Atherosclerosis of aorta: Secondary | ICD-10-CM | POA: Diagnosis not present

## 2023-11-23 DIAGNOSIS — L728 Other follicular cysts of the skin and subcutaneous tissue: Secondary | ICD-10-CM | POA: Diagnosis not present

## 2023-11-23 DIAGNOSIS — L821 Other seborrheic keratosis: Secondary | ICD-10-CM | POA: Diagnosis not present

## 2023-12-27 DIAGNOSIS — Z299 Encounter for prophylactic measures, unspecified: Secondary | ICD-10-CM | POA: Diagnosis not present

## 2023-12-27 DIAGNOSIS — R0981 Nasal congestion: Secondary | ICD-10-CM | POA: Diagnosis not present

## 2023-12-27 DIAGNOSIS — J069 Acute upper respiratory infection, unspecified: Secondary | ICD-10-CM | POA: Diagnosis not present

## 2023-12-27 DIAGNOSIS — N390 Urinary tract infection, site not specified: Secondary | ICD-10-CM | POA: Diagnosis not present

## 2024-01-03 DIAGNOSIS — Z6829 Body mass index (BMI) 29.0-29.9, adult: Secondary | ICD-10-CM | POA: Diagnosis not present

## 2024-01-03 DIAGNOSIS — R030 Elevated blood-pressure reading, without diagnosis of hypertension: Secondary | ICD-10-CM | POA: Diagnosis not present

## 2024-01-03 DIAGNOSIS — N3001 Acute cystitis with hematuria: Secondary | ICD-10-CM | POA: Diagnosis not present

## 2024-01-03 DIAGNOSIS — E663 Overweight: Secondary | ICD-10-CM | POA: Diagnosis not present

## 2024-01-10 DIAGNOSIS — N390 Urinary tract infection, site not specified: Secondary | ICD-10-CM | POA: Diagnosis not present

## 2024-01-10 DIAGNOSIS — R350 Frequency of micturition: Secondary | ICD-10-CM | POA: Diagnosis not present

## 2024-01-10 DIAGNOSIS — Z299 Encounter for prophylactic measures, unspecified: Secondary | ICD-10-CM | POA: Diagnosis not present

## 2024-01-10 DIAGNOSIS — I10 Essential (primary) hypertension: Secondary | ICD-10-CM | POA: Diagnosis not present

## 2024-01-25 DIAGNOSIS — I5032 Chronic diastolic (congestive) heart failure: Secondary | ICD-10-CM | POA: Diagnosis not present

## 2024-01-25 DIAGNOSIS — R809 Proteinuria, unspecified: Secondary | ICD-10-CM | POA: Diagnosis not present

## 2024-01-25 DIAGNOSIS — N139 Obstructive and reflux uropathy, unspecified: Secondary | ICD-10-CM | POA: Diagnosis not present

## 2024-01-25 DIAGNOSIS — E559 Vitamin D deficiency, unspecified: Secondary | ICD-10-CM | POA: Diagnosis not present

## 2024-01-25 DIAGNOSIS — I129 Hypertensive chronic kidney disease with stage 1 through stage 4 chronic kidney disease, or unspecified chronic kidney disease: Secondary | ICD-10-CM | POA: Diagnosis not present

## 2024-01-25 DIAGNOSIS — N189 Chronic kidney disease, unspecified: Secondary | ICD-10-CM | POA: Diagnosis not present

## 2024-01-25 DIAGNOSIS — N182 Chronic kidney disease, stage 2 (mild): Secondary | ICD-10-CM | POA: Diagnosis not present

## 2024-02-09 DIAGNOSIS — R809 Proteinuria, unspecified: Secondary | ICD-10-CM | POA: Diagnosis not present

## 2024-02-09 DIAGNOSIS — N182 Chronic kidney disease, stage 2 (mild): Secondary | ICD-10-CM | POA: Diagnosis not present

## 2024-02-09 DIAGNOSIS — I129 Hypertensive chronic kidney disease with stage 1 through stage 4 chronic kidney disease, or unspecified chronic kidney disease: Secondary | ICD-10-CM | POA: Diagnosis not present

## 2024-02-09 DIAGNOSIS — I5032 Chronic diastolic (congestive) heart failure: Secondary | ICD-10-CM | POA: Diagnosis not present

## 2024-02-29 DIAGNOSIS — Z23 Encounter for immunization: Secondary | ICD-10-CM | POA: Diagnosis not present

## 2024-02-29 DIAGNOSIS — C50212 Malignant neoplasm of upper-inner quadrant of left female breast: Secondary | ICD-10-CM | POA: Diagnosis not present

## 2024-02-29 DIAGNOSIS — Z171 Estrogen receptor negative status [ER-]: Secondary | ICD-10-CM | POA: Diagnosis not present

## 2024-05-09 DIAGNOSIS — R0602 Shortness of breath: Secondary | ICD-10-CM | POA: Diagnosis not present

## 2024-05-09 DIAGNOSIS — R0981 Nasal congestion: Secondary | ICD-10-CM | POA: Diagnosis not present

## 2024-05-09 DIAGNOSIS — J069 Acute upper respiratory infection, unspecified: Secondary | ICD-10-CM | POA: Diagnosis not present

## 2024-05-09 DIAGNOSIS — Z299 Encounter for prophylactic measures, unspecified: Secondary | ICD-10-CM | POA: Diagnosis not present
